# Patient Record
Sex: Female | Born: 1935 | Race: White | Hispanic: No | Marital: Single | Smoking: Former smoker
Health system: Southern US, Community
[De-identification: ages and names within clinical notes are randomized; demographics above are authoritative.]

## PROBLEM LIST (undated history)

## (undated) DIAGNOSIS — M810 Age-related osteoporosis without current pathological fracture: Secondary | ICD-10-CM

## (undated) DIAGNOSIS — E785 Hyperlipidemia, unspecified: Secondary | ICD-10-CM

## (undated) DIAGNOSIS — H3530 Unspecified macular degeneration: Secondary | ICD-10-CM

## (undated) DIAGNOSIS — I872 Venous insufficiency (chronic) (peripheral): Secondary | ICD-10-CM

## (undated) DIAGNOSIS — K5520 Angiodysplasia of colon without hemorrhage: Secondary | ICD-10-CM

## (undated) DIAGNOSIS — I4891 Unspecified atrial fibrillation: Secondary | ICD-10-CM

## (undated) DIAGNOSIS — F411 Generalized anxiety disorder: Secondary | ICD-10-CM

## (undated) DIAGNOSIS — K5730 Diverticulosis of large intestine without perforation or abscess without bleeding: Secondary | ICD-10-CM

## (undated) DIAGNOSIS — M1990 Unspecified osteoarthritis, unspecified site: Secondary | ICD-10-CM

## (undated) DIAGNOSIS — K55059 Acute (reversible) ischemia of intestine, part and extent unspecified: Secondary | ICD-10-CM

## (undated) DIAGNOSIS — K5731 Diverticulosis of large intestine without perforation or abscess with bleeding: Secondary | ICD-10-CM

## (undated) DIAGNOSIS — K219 Gastro-esophageal reflux disease without esophagitis: Secondary | ICD-10-CM

## (undated) DIAGNOSIS — D62 Acute posthemorrhagic anemia: Secondary | ICD-10-CM

## (undated) HISTORY — DX: Gastro-esophageal reflux disease without esophagitis: K21.9

## (undated) HISTORY — DX: Diverticulosis of large intestine without perforation or abscess without bleeding: K57.30

## (undated) HISTORY — DX: Unspecified macular degeneration: H35.30

## (undated) HISTORY — DX: Venous insufficiency (chronic) (peripheral): I87.2

## (undated) HISTORY — DX: Hyperlipidemia, unspecified: E78.5

## (undated) HISTORY — DX: Acute posthemorrhagic anemia: D62

## (undated) HISTORY — DX: Unspecified osteoarthritis, unspecified site: M19.90

## (undated) HISTORY — DX: Generalized anxiety disorder: F41.1

## (undated) HISTORY — DX: Angiodysplasia of colon without hemorrhage: K55.20

## (undated) HISTORY — DX: Unspecified atrial fibrillation: I48.91

## (undated) HISTORY — DX: Age-related osteoporosis without current pathological fracture: M81.0

## (undated) HISTORY — DX: Diverticulosis of large intestine without perforation or abscess with bleeding: K57.31

---
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
## 1998-04-10 ENCOUNTER — Ambulatory Visit (HOSPITAL_COMMUNITY): Admission: RE | Admit: 1998-04-10 | Discharge: 1998-04-10 | Payer: Self-pay | Admitting: Obstetrics & Gynecology

## 1999-04-17 ENCOUNTER — Other Ambulatory Visit: Admission: RE | Admit: 1999-04-17 | Discharge: 1999-04-17 | Payer: Self-pay | Admitting: Obstetrics & Gynecology

## 2001-11-07 ENCOUNTER — Other Ambulatory Visit: Admission: RE | Admit: 2001-11-07 | Discharge: 2001-11-07 | Payer: Self-pay | Admitting: Obstetrics & Gynecology

## 2003-12-04 ENCOUNTER — Other Ambulatory Visit: Admission: RE | Admit: 2003-12-04 | Discharge: 2003-12-04 | Payer: Self-pay | Admitting: Obstetrics & Gynecology

## 2004-06-01 ENCOUNTER — Encounter: Payer: Self-pay | Admitting: Gastroenterology

## 2004-06-01 DIAGNOSIS — K5730 Diverticulosis of large intestine without perforation or abscess without bleeding: Secondary | ICD-10-CM | POA: Insufficient documentation

## 2004-06-01 DIAGNOSIS — K5520 Angiodysplasia of colon without hemorrhage: Secondary | ICD-10-CM

## 2004-07-24 ENCOUNTER — Ambulatory Visit: Payer: Self-pay | Admitting: Pulmonary Disease

## 2005-01-26 ENCOUNTER — Ambulatory Visit: Payer: Self-pay | Admitting: Pulmonary Disease

## 2005-03-09 ENCOUNTER — Ambulatory Visit: Payer: Self-pay | Admitting: Pulmonary Disease

## 2005-07-27 ENCOUNTER — Ambulatory Visit: Payer: Self-pay | Admitting: Pulmonary Disease

## 2005-08-05 ENCOUNTER — Ambulatory Visit: Payer: Self-pay | Admitting: Pulmonary Disease

## 2005-11-11 ENCOUNTER — Ambulatory Visit: Payer: Self-pay | Admitting: Pulmonary Disease

## 2006-01-25 ENCOUNTER — Ambulatory Visit: Payer: Self-pay | Admitting: Pulmonary Disease

## 2006-04-27 ENCOUNTER — Ambulatory Visit (HOSPITAL_COMMUNITY): Admission: RE | Admit: 2006-04-27 | Discharge: 2006-04-27 | Payer: Self-pay | Admitting: Pulmonary Disease

## 2006-04-27 ENCOUNTER — Ambulatory Visit: Payer: Self-pay | Admitting: Pulmonary Disease

## 2006-06-08 ENCOUNTER — Ambulatory Visit: Payer: Self-pay | Admitting: Pulmonary Disease

## 2006-07-26 ENCOUNTER — Ambulatory Visit: Payer: Self-pay | Admitting: Pulmonary Disease

## 2007-01-24 ENCOUNTER — Ambulatory Visit: Payer: Self-pay | Admitting: Pulmonary Disease

## 2007-01-24 LAB — CONVERTED CEMR LAB
ALT: 21 units/L (ref 0–40)
Alkaline Phosphatase: 50 units/L (ref 39–117)
BUN: 14 mg/dL (ref 6–23)
Basophils Relative: 0.5 % (ref 0.0–1.0)
CO2: 32 meq/L (ref 19–32)
Calcium: 8.9 mg/dL (ref 8.4–10.5)
Creatinine, Ser: 0.8 mg/dL (ref 0.4–1.2)
HDL: 55.6 mg/dL (ref >39.0)
Hemoglobin: 14.5 g/dL (ref 12.0–15.0)
LDL Cholesterol: 61 mg/dL (ref 0–99)
Monocytes Absolute: 0.5 10*3/uL (ref 0.2–0.7)
Monocytes Relative: 8.4 % (ref 3.0–11.0)
Platelets: 229 10*3/uL (ref 150–400)
RDW: 13.3 % (ref 11.5–14.6)
Total Bilirubin: 0.9 mg/dL (ref 0.3–1.2)
Total Protein: 7 g/dL (ref 6.0–8.3)
VLDL: 18 mg/dL (ref 0–40)
WBC: 6.4 10*3/uL (ref 4.5–10.5)

## 2007-02-14 ENCOUNTER — Encounter: Payer: Self-pay | Admitting: Pulmonary Disease

## 2007-05-12 ENCOUNTER — Ambulatory Visit: Payer: Self-pay | Admitting: Gastroenterology

## 2007-05-12 ENCOUNTER — Ambulatory Visit: Payer: Self-pay | Admitting: Pulmonary Disease

## 2007-05-12 LAB — CONVERTED CEMR LAB
CO2: 31 meq/L (ref 19–32)
GFR calc Af Amer: 89 mL/min
Glucose, Bld: 101 mg/dL — ABNORMAL HIGH (ref 70–99)
Potassium: 4.1 meq/L (ref 3.5–5.1)
Sodium: 142 meq/L (ref 135–145)

## 2007-05-31 ENCOUNTER — Ambulatory Visit: Payer: Self-pay | Admitting: Internal Medicine

## 2007-06-05 ENCOUNTER — Ambulatory Visit: Payer: Self-pay

## 2007-06-05 ENCOUNTER — Ambulatory Visit: Payer: Self-pay | Admitting: Internal Medicine

## 2007-06-05 ENCOUNTER — Encounter: Payer: Self-pay | Admitting: Internal Medicine

## 2007-06-06 ENCOUNTER — Ambulatory Visit: Payer: Self-pay | Admitting: Pulmonary Disease

## 2007-06-23 ENCOUNTER — Ambulatory Visit: Payer: Self-pay | Admitting: Pulmonary Disease

## 2007-07-25 ENCOUNTER — Ambulatory Visit: Payer: Self-pay | Admitting: Internal Medicine

## 2007-09-07 ENCOUNTER — Encounter: Payer: Self-pay | Admitting: Pulmonary Disease

## 2007-09-07 ENCOUNTER — Ambulatory Visit: Payer: Self-pay

## 2007-10-02 DIAGNOSIS — E785 Hyperlipidemia, unspecified: Secondary | ICD-10-CM | POA: Insufficient documentation

## 2007-10-03 ENCOUNTER — Ambulatory Visit: Payer: Self-pay | Admitting: Pulmonary Disease

## 2007-10-03 DIAGNOSIS — M810 Age-related osteoporosis without current pathological fracture: Secondary | ICD-10-CM

## 2007-10-03 DIAGNOSIS — I4821 Permanent atrial fibrillation: Secondary | ICD-10-CM | POA: Insufficient documentation

## 2007-10-03 DIAGNOSIS — M1990 Unspecified osteoarthritis, unspecified site: Secondary | ICD-10-CM | POA: Insufficient documentation

## 2007-10-03 DIAGNOSIS — F411 Generalized anxiety disorder: Secondary | ICD-10-CM

## 2007-10-03 DIAGNOSIS — I872 Venous insufficiency (chronic) (peripheral): Secondary | ICD-10-CM | POA: Insufficient documentation

## 2007-10-03 DIAGNOSIS — K219 Gastro-esophageal reflux disease without esophagitis: Secondary | ICD-10-CM

## 2007-10-03 DIAGNOSIS — I4891 Unspecified atrial fibrillation: Secondary | ICD-10-CM

## 2007-10-03 DIAGNOSIS — H353133 Nonexudative age-related macular degeneration, bilateral, advanced atrophic without subfoveal involvement: Secondary | ICD-10-CM

## 2007-10-03 LAB — CONVERTED CEMR LAB
ALT: 22 units/L (ref 0–35)
AST: 25 units/L (ref 0–37)
Alkaline Phosphatase: 58 units/L (ref 39–117)
BUN: 15 mg/dL (ref 6–23)
Basophils Relative: 0.4 % (ref 0.0–1.0)
CO2: 33 meq/L — ABNORMAL HIGH (ref 19–32)
Creatinine, Ser: 0.8 mg/dL (ref 0.4–1.2)
HCT: 43.8 % (ref 36.0–46.0)
Hemoglobin: 14.7 g/dL (ref 12.0–15.0)
LDL Cholesterol: 62 mg/dL (ref 0–99)
Monocytes Absolute: 0.5 10*3/uL (ref 0.2–0.7)
Monocytes Relative: 8.3 % (ref 3.0–11.0)
Potassium: 4.9 meq/L (ref 3.5–5.1)
RDW: 12.4 % (ref 11.5–14.6)
Total Bilirubin: 0.8 mg/dL (ref 0.3–1.2)
Total Protein: 6.8 g/dL (ref 6.0–8.3)
VLDL: 20 mg/dL (ref 0–40)

## 2007-10-26 ENCOUNTER — Ambulatory Visit: Payer: Self-pay | Admitting: Internal Medicine

## 2008-04-02 ENCOUNTER — Ambulatory Visit: Payer: Self-pay | Admitting: Pulmonary Disease

## 2008-04-08 ENCOUNTER — Encounter: Payer: Self-pay | Admitting: Pulmonary Disease

## 2008-05-02 ENCOUNTER — Ambulatory Visit: Payer: Self-pay | Admitting: Internal Medicine

## 2008-05-15 ENCOUNTER — Ambulatory Visit: Payer: Self-pay | Admitting: Pulmonary Disease

## 2008-09-10 ENCOUNTER — Ambulatory Visit: Payer: Self-pay | Admitting: Internal Medicine

## 2008-10-01 ENCOUNTER — Ambulatory Visit: Payer: Self-pay | Admitting: Pulmonary Disease

## 2008-10-06 LAB — CONVERTED CEMR LAB
ALT: 27 units/L (ref 0–35)
AST: 31 units/L (ref 0–37)
Basophils Relative: 0.2 % (ref 0.0–3.0)
CO2: 29 meq/L (ref 19–32)
Calcium: 9 mg/dL (ref 8.4–10.5)
Chloride: 104 meq/L (ref 96–112)
Cholesterol: 133 mg/dL (ref 0–200)
Creatinine, Ser: 0.7 mg/dL (ref 0.4–1.2)
Eosinophils Relative: 2.2 % (ref 0.0–5.0)
Glucose, Bld: 99 mg/dL (ref 70–99)
Hemoglobin: 15 g/dL (ref 12.0–15.0)
LDL Cholesterol: 60 mg/dL (ref 0–99)
Lymphocytes Relative: 28.1 % (ref 12.0–46.0)
Monocytes Relative: 8.9 % (ref 3.0–12.0)
Neutro Abs: 3.5 10*3/uL (ref 1.4–7.7)
RBC: 4.84 M/uL (ref 3.87–5.11)
TSH: 1.83 microintl units/mL (ref 0.35–5.50)
Total CHOL/HDL Ratio: 2.4
Total Protein: 7.3 g/dL (ref 6.0–8.3)
Vit D, 1,25-Dihydroxy: 29 — ABNORMAL LOW (ref 30–89)
WBC: 5.7 10*3/uL (ref 4.5–10.5)

## 2008-10-22 ENCOUNTER — Telehealth (INDEPENDENT_AMBULATORY_CARE_PROVIDER_SITE_OTHER): Payer: Self-pay | Admitting: *Deleted

## 2008-12-03 ENCOUNTER — Telehealth: Payer: Self-pay | Admitting: Internal Medicine

## 2009-04-01 ENCOUNTER — Ambulatory Visit: Payer: Self-pay | Admitting: Pulmonary Disease

## 2009-04-01 LAB — CONVERTED CEMR LAB
Calcium: 9.2 mg/dL (ref 8.4–10.5)
GFR calc non Af Amer: 73.53 mL/min (ref >60)
Glucose, Bld: 97 mg/dL (ref 70–99)
Potassium: 4.6 meq/L (ref 3.5–5.1)
Sodium: 144 meq/L (ref 135–145)
Total CHOL/HDL Ratio: 3
VLDL: 19.8 mg/dL (ref 0.0–40.0)

## 2009-05-01 ENCOUNTER — Encounter: Payer: Self-pay | Admitting: Gastroenterology

## 2009-05-21 ENCOUNTER — Ambulatory Visit: Payer: Self-pay | Admitting: Pulmonary Disease

## 2009-08-26 ENCOUNTER — Ambulatory Visit: Payer: Self-pay | Admitting: Internal Medicine

## 2009-10-01 ENCOUNTER — Ambulatory Visit: Payer: Self-pay | Admitting: Pulmonary Disease

## 2009-10-31 ENCOUNTER — Telehealth (INDEPENDENT_AMBULATORY_CARE_PROVIDER_SITE_OTHER): Payer: Self-pay | Admitting: *Deleted

## 2010-03-31 ENCOUNTER — Ambulatory Visit: Payer: Self-pay | Admitting: Pulmonary Disease

## 2010-04-06 LAB — CONVERTED CEMR LAB
ALT: 20 units/L (ref 0–35)
AST: 25 units/L (ref 0–37)
Alkaline Phosphatase: 66 units/L (ref 39–117)
BUN: 19 mg/dL (ref 6–23)
Basophils Absolute: 0 10*3/uL (ref 0.0–0.1)
Bilirubin, Direct: 0.1 mg/dL (ref 0.0–0.3)
Chloride: 104 meq/L (ref 96–112)
Cholesterol: 147 mg/dL (ref 0–200)
Eosinophils Absolute: 0.1 10*3/uL (ref 0.0–0.7)
Eosinophils Relative: 1.5 % (ref 0.0–5.0)
GFR calc non Af Amer: 85.56 mL/min (ref >60)
HCT: 42 % (ref 36.0–46.0)
LDL Cholesterol: 72 mg/dL (ref 0–99)
Lymphs Abs: 1.6 10*3/uL (ref 0.7–4.0)
MCHC: 33.8 g/dL (ref 30.0–36.0)
MCV: 93.6 fL (ref 78.0–100.0)
Monocytes Absolute: 0.5 10*3/uL (ref 0.1–1.0)
Neutrophils Relative %: 60.4 % (ref 43.0–77.0)
Platelets: 200 10*3/uL (ref 150.0–400.0)
Potassium: 4.9 meq/L (ref 3.5–5.1)
RDW: 13.9 % (ref 11.5–14.6)
Sodium: 143 meq/L (ref 135–145)
Total Bilirubin: 0.8 mg/dL (ref 0.3–1.2)
Triglycerides: 103 mg/dL (ref 0.0–149.0)
WBC: 5.5 10*3/uL (ref 4.5–10.5)

## 2010-05-27 ENCOUNTER — Ambulatory Visit: Payer: Self-pay | Admitting: Pulmonary Disease

## 2010-05-28 ENCOUNTER — Encounter: Payer: Self-pay | Admitting: Pulmonary Disease

## 2010-05-28 LAB — HM DEXA SCAN

## 2010-08-25 ENCOUNTER — Ambulatory Visit
Admission: RE | Admit: 2010-08-25 | Discharge: 2010-08-25 | Payer: Self-pay | Source: Home / Self Care | Attending: Pulmonary Disease | Admitting: Pulmonary Disease

## 2010-08-27 ENCOUNTER — Ambulatory Visit: Admit: 2010-08-27 | Payer: Self-pay | Admitting: Internal Medicine

## 2010-09-17 ENCOUNTER — Encounter: Payer: Self-pay | Admitting: Internal Medicine

## 2010-09-17 ENCOUNTER — Ambulatory Visit
Admission: RE | Admit: 2010-09-17 | Discharge: 2010-09-17 | Payer: Self-pay | Source: Home / Self Care | Attending: Internal Medicine | Admitting: Internal Medicine

## 2010-09-24 NOTE — Procedures (Signed)
Summary: Recall / [facility]  Recall / [facility]   Imported By: Lennie Odor 01/22/2010 16:56:33  _____________________________________________________________________  External Attachment:    Type:   Image     Comment:   External Document

## 2010-09-24 NOTE — Assessment & Plan Note (Signed)
Summary: 6 months/apc   Primary Care Provider:  [name]  CC:  6 month ROV & review of mult medical problems....  History of Present Illness: 77 y/o WF here for a follow up visit... he has multiple medical problems as noted below...    ~  October 02, 2009:  her stress level is reduced since [name] passes & her caregiver role has diminished... generally stable w/ no new complaints or concerns... she saw [name] 1/11- chr AFib doing well, no changes made, f/u 91yr... she stopped her Bisphos Rx (Actonel) in 2010 & refused to restart "I do Yogurt"- offered the new ATELVIA (easier to take, coated for less GI side effects) & she will consider this.   ~  March 31, 2010:  she's had a good 37mo- no new complaints or concerns... she tripped & fell down 3 steps at home- no injury but great concern because of her [name]- last BMD [name] 2008 & she needs f/u BMD but declines "I'll think about it" and still refuses meds- pills, shots, IV, etc... otherw stable- denies CP, palpit, SOB, etc;  BP controlled;  Lipids have been good on the simva20;  Aplraz refilled today & fasting labs done...   Current Problem List:  MACULAR DEGENERATION (ICD-362.50) - she's followed in the [facility] [place] clinic Q74mo... on Eye Vits and stable...  ATRIAL FIBRILLATION, CHRONIC (ICD-427.31) - on ASA 81mg /d... 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 HR=70 today and she feels well, BP= 118/64... denies CP, palpit, dizzy, SOB, etc...  ~  Cards f/u [name] 1/11- doing well, no changes, f/u planned 90yr.  VENOUS INSUFFICIENCY (ICD-459.81) - she follows a low sodium diet... not on diuretic therapy.  HYPERLIPIDEMIA (ICD-272.4) - on SIMVASTATIN 20mg /d now (changed from Vytorin to save $$).  ~  FLP was 6/08 showing TChol 135, TG 90, HDL 56, LDL 61...  ~  FLP 2/09 showed TChol 140, TG 99, HDL 59, LDL 62... great!  ~  FLP 2/10 on Vytor10/20 showed TChol 133, TG 83, HDL 56,  LDL 60... rec> change to Simva20.  ~  FLP 8/10 on Simva20 showed TChol 143, TG 99, HDL 51, LDL 73  ~  FLP 8/11 on Simva20 showed TChol 147, TG 103, HDL 54, LDL 72  GERD (ICD-530.81) - she takes PRILOSEC 20mg /d as needed...  DIVERTICULOSIS OF COLON (ICD-562.10) - no abd pain or episodes of diverticulitis... ANGIODYSPLASIA, COLON (EAV-409.81) - last colonoscopy was 10/05 by [name] showing divertics and colon AVMs... f/u planned in 57yrs.  DEGENERATIVE JOINT DISEASE (ICD-715.90) - only c/o some hand pain... uses OTC meds as needed... her main exercise is "up and down the steps 40-11 times a day"...  OSTEOPOROSIS (ICD-733.00) - prev took Actonel but stopped on her own 2010...  takes Ca++, MVI, VitD OTC... her last BMD was 3/08 by [name] for GYN and he follows this (she gets BMD and Mammograms at his office)... she states INTOL to Alendronate "it made me sick" & refuses to restart meds for her bones (either pills, shots, or IV Rx)...  ~  labs 2/10 showed Vit D level = 29... rec> add Vit D 1000 u OTC daily.  ~  labs 8/10 showed Vit D level = 31... rec> continue 1000 u daily.  ANXIETY (ICD-300.00) - she has ALPRAZOLAM 0.5mg  - 1/2 to 1 tab Tid Prn...   Preventive Screening-Counseling & Management  Alcohol-Tobacco     Smoking Status: never     Passive  Smoke Exposure: yes  Allergies: 1)  ! Penicillin 2)  ! Lipitor (Atorvastatin Calcium) 3)  ! Fosamax (Alendronate Sodium)  Comments:  Nurse/Medical Assistant: The patient's medications and allergies were reviewed with the patient and were updated in the Medication and Allergy Lists.  Past History:  Past Medical History: MACULAR DEGENERATION (ICD-362.50) ATRIAL FIBRILLATION, CHRONIC (ICD-427.31) VENOUS INSUFFICIENCY (ICD-459.81) HYPERLIPIDEMIA (ICD-272.4) GERD (ICD-530.81) DIVERTICULOSIS OF COLON (ICD-562.10) ANGIODYSPLASIA, COLON (ICD-569.84) DEGENERATIVE JOINT DISEASE (ICD-715.90) OSTEOPOROSIS (ICD-733.00) ANXIETY  (ICD-300.00)  Past Surgical History: Appendectomy (1610) Tonsillectomy (1943)  Family History: Reviewed history from 04/02/2008 and no changes required. Sister - heart disease - (atrial fib) Sister - breast cancer Sister - lymphoma Mother - died at age 33 Father - deceased at age 25, hx hemochromatosis  Social History: Reviewed history from 04/01/2009 and no changes required. Single No children Retired  Tobacco Use - No.  Alcohol Use - no Regular Exercise - no  Review of Systems      See HPI  The patient denies anorexia, fever, weight loss, weight gain, vision loss, decreased hearing, hoarseness, chest pain, syncope, dyspnea on exertion, peripheral edema, prolonged cough, headaches, hemoptysis, abdominal pain, melena, hematochezia, severe indigestion/heartburn, hematuria, incontinence, muscle weakness, suspicious skin lesions, transient blindness, difficulty walking, depression, unusual weight change, abnormal bleeding, enlarged lymph nodes, and angioedema.    Vital Signs:  Patient profile:   77 year old female Height:      66 inches Weight:      159 pounds BMI:     25.76 O2 Sat:      94 % on Room air Temp:     96.3 degrees F oral Pulse rate:   70 / minute BP sitting:   118 / 64  (right arm) Cuff size:   regular  Vitals Entered By: Randell Loop CMA (March 31, 2010 10:22 AM)  O2 Sat at Rest %:  94 O2 Flow:  Room air CC: 6 month ROV & review of mult medical problems... Is Patient Diabetic? No Pain Assessment Patient in pain? no      Comments meds updated today---pt brought in most meds today   Physical Exam  Additional Exam:  WD, WN, 77 y/o WF in NAD... GENERAL:  Alert & oriented; pleasant & cooperative... HEENT:  [place]/AT, EOM-wnl, PERRLA, EACs-clear, TMs-wnl, NOSE-clear, THROAT-clear & wnl. NECK:  Supple w/ fairROM; no JVD; normal carotid impulses w/o bruits; no thyromegaly or nodules palpated; no lymphadenopathy. CHEST:  Clear to P & A; without wheezes/  rales/ or rhonchi... HEART:  irreg w/ controlled response, without murmurs/ rubs/ or gallops heard... ABDOMEN:  Soft & nontender; normal bowel sounds; no organomegaly or masses detected. EXT: without deformities, mild arthritic changes; no varicose veins/ +venous insuffic/ no edema. NEURO:  CN's intact; no focal neuro deficits... DERM:  No lesions noted; no rash etc...    MISC. Report  Procedure date:  03/31/2010  Findings:      BMP (METABOL)   Sodium                    143 mEq/L                   135-145   Potassium                 4.9 mEq/L                   3.5-5.1   Chloride  104 mEq/L                   96-112   Carbon Dioxide            32 mEq/L                    19-32   Glucose                   94 mg/dL                    04-54   BUN                       19 mg/dL                    0-98   Creatinine                0.7 mg/dL                   1.1-9.1   Calcium                   9.1 mg/dL                   4.7-82.9   GFR                       85.56 mL/min                >60  Hepatic/Liver Function Panel (HEPATIC)   Total Bilirubin           0.8 mg/dL                   5.6-2.1   Direct Bilirubin          0.1 mg/dL                   3.0-8.6   Alkaline Phosphatase      66 U/L                      39-117   AST                       25 U/L                      0-37   ALT                       20 U/L                      0-35   Total Protein             6.7 g/dL                    5.7-8.4   Albumin                   4.0 g/dL                    6.9-6.2  CBC Platelet w/Diff (CBCD)   White Cell Count          5.5 K/uL                    4.5-10.5  Red Cell Count            4.49 Mil/uL                 3.87-5.11   Hemoglobin                14.2 g/dL                   04.5-40.9   Hematocrit                42.0 %                      36.0-46.0   MCV                       93.6 fl                     78.0-100.0   Platelet Count            200.0 K/uL                   150.0-400.0   Neutrophil %              60.4 %                      43.0-77.0   Lymphocyte %              28.8 %                      12.0-46.0   Monocyte %                8.8 %                       3.0-12.0   Eosinophils%              1.5 %                       0.0-5.0   Basophils %               0.5 %                       0.0-3.0  Comments:      Lipid Panel (LIPID)   Cholesterol               147 mg/dL                   8-119   Triglycerides             103.0 mg/dL                 1.4-782.9   HDL                       56.21 mg/dL                 >30.86   LDL Cholesterol           72 mg/dL                    5-78   TSH (TSH)   FastTSH                   1.76 uIU/mL  0.35-5.50   Impression & Recommendations:  Problem # 1:  ATRIAL FIBRILLATION, CHRONIC (ICD-427.31) Stable w/o CP, palpit, SOB, etc... rate control strategy & no coumadin... Her updated medication list for this problem includes:    Adult Aspirin Ec Low Strength 81 Mg Tbec (Aspirin) .Marland Kitchen... Take 2 tablets by mouth once daily    Metoprolol Succinate 25 Mg Xr24h-tab (Metoprolol succinate) .Marland Kitchen... Take 1 tablet by mouth once a day    Cardizem Cd 180 Mg Cp24 (Diltiazem hcl coated beads) .Marland Kitchen... 1 tab by mouth once daily  Orders: TLB-BMP (Basic Metabolic Panel-BMET) (80048-METABOL) TLB-Hepatic/Liver Function Pnl (80076-HEPATIC) TLB-CBC Platelet - w/Differential (85025-CBCD) TLB-Lipid Panel (80061-LIPID) TLB-TSH (Thyroid Stimulating Hormone) (84443-TSH)  Problem # 2:  HYPERLIPIDEMIA (ICD-272.4) Stable on diet + Simva20>  continue same. Her updated medication list for this problem includes:    Simvastatin 20 Mg Tabs (Simvastatin) .Marland Kitchen... Take 1 tab by mouth at bedtime...  Problem # 3:  GI>>> She has GERD, Divertics, AVMs>  she remains asymptomatic from the GI standpoint & doesn't want routine GI f/u or repeat colonoscopy...  uses Prilosec OTC Prn...  Problem # 4:  DEGENERATIVE JOINT DISEASE (ICD-715.90) She  manages satis w/ OTC anti-inflamm meds as needed & Tylenol... Her updated medication list for this problem includes:    Adult Aspirin Ec Low Strength 81 Mg Tbec (Aspirin) .Marland Kitchen... Take 2 tablets by mouth once daily  Problem # 5:  OSTEOPOROSIS (ICD-733.00) We had a long discussion regarding her Osteoporosis & need for therapy> her last BMD was 2008 by [name] & she declines f/u BMD to check her status at this time... likewise refuses Bisphos Rx- oral or IV, and doesn't want Forteo shots etc... she knows to be careful, she has a friend w/ severe bone problems, states she'll "think about it"...   Problem # 6:  ANXIETY (ICD-300.00) Alpraz refilled per pt request... Her updated medication list for this problem includes:    Alprazolam 0.5 Mg Tabs (Alprazolam) .Marland Kitchen... Take 1/2 to 1 tab by mouth three times a day as needed for nerves...  Problem # 7:  OTHER MEDICAL PROBLEMS AS NOTED>>> OK TDAP today...  Complete Medication List: 1)  Adult Aspirin Ec Low Strength 81 Mg Tbec (Aspirin) .... Take 2 tablets by mouth once daily 2)  Metoprolol Succinate 25 Mg Xr24h-tab (Metoprolol succinate) .... Take 1 tablet by mouth once a day 3)  Cardizem Cd 180 Mg Cp24 (Diltiazem hcl coated beads) .Marland Kitchen.. 1 tab by mouth once daily 4)  Simvastatin 20 Mg Tabs (Simvastatin) .... Take 1 tab by mouth at bedtime.Marland KitchenMarland Kitchen 5)  Prilosec Otc 20 Mg Tbec (Omeprazole magnesium) .... Take 1 tablet by mouth once a day 6)  Caltrate 600+d 600-400 Mg-unit Tabs (Calcium carbonate-vitamin d) .Marland Kitchen.. 1 tab twice daily.Marland KitchenMarland Kitchen 7)  Eye Vitamins Tabs (Multiple vitamins-minerals) .... Take one tablet daiy. 8)  Centrum Silver Tabs (Multiple vitamins-minerals) .... Take 1 tablet by mouth once a day as needed 9)  Vitamin D 1000 Unit Tabs (Cholecalciferol) .... Once daily 10)  Alprazolam 0.5 Mg Tabs (Alprazolam) .... Take 1/2 to 1 tab by mouth three times a day as needed for nerves...  Other Orders: Tdap => 7yrs IM (51761) Admin 1st Vaccine (60737)  Patient  Instructions: 1)  Today we updated your med list- see below.... 2)  We refilled your Alprazolam perscription today... 3)  Today we did your follow up FASTING blood work... please call the "phone tree" in a few days for your lab results.Marland KitchenMarland Kitchen  4)  We also gave  you the combined Tetanus shot called the TDAP today (it should be good for 34yrs)... 5)  Call for any problems.Marland KitchenMarland Kitchen 6)  Please schedule a follow-up appointment in 6 months. Prescriptions: ALPRAZOLAM 0.5 MG  TABS (ALPRAZOLAM) take 1/2 to 1 tab by mouth three times a day as needed for nerves...  #90 x 6   Entered and Authorized by:   Michele Mcalpine MD   Signed by:   Michele Mcalpine MD on 03/31/2010   Method used:   Print then Give to Patient   RxID:   [id number]    Immunizations Administered:  Tetanus Vaccine:    Vaccine Type: Tdap    Site: left deltoid    Mfr: boostrix    Dose: 0.5 ml    Route: IM    Given by: Randell Loop CMA    Exp. Date: 11/14/2011    Lot #: [id number]    VIS given: 07/11/07 version given March 31, 2010.

## 2010-09-24 NOTE — Assessment & Plan Note (Signed)
Summary: Acute NP office visit - sore throat   Primary Provider/Referring Provider:  [name]  CC:  sore throat, hoarseness, some upper airway wheezing, occ prod cough with clear mucus, and PND x3days - denies f/c/s.  History of Present Illness: 77 y/o WF     ~  October 02, 2009:  her stress level is reduced since [name] passes & her caregiver role has diminished... generally stable w/ no new complaints or concerns... she saw [name] 1/11- chr AFib doing well, no changes made, f/u 69yr... she stopped her Bisphos Rx (Actonel) in 2010 & refused to restart "I do Yogurt"- offered the new ATELVIA (easier to take, coated for less GI side effects) & she will consider this.   ~  March 31, 2010:  she's had a good 32mo- no new complaints or concerns... she tripped & fell down 3 steps at home- no injury but great concern because of her [name]- last BMD [name] 2008 & she needs f/u BMD but declines "I'll think about it" and still refuses meds- pills, shots, IV, etc... otherw stable- denies CP, palpit, SOB, etc;  BP controlled;  Lipids have been good on the simva20;  Aplraz refilled today & fasting labs done...  August 25, 2010 --Presents for an acute office visit. Complains of sore throat, hoarseness, some upper airway wheezing, occ prod cough with clear mucus, PND x3days. Hoarseness is better today. Cough is mild and dry. W/ no discolored mucus. Denies chest pain,  orthopnea, hemoptysis, fever, n/v/d, edema, headache,recent travel .   Medications Prior to Update: 1)  Adult Aspirin Ec Low Strength 81 Mg  Tbec (Aspirin) .... Take 2 Tablets By Mouth Once Daily 2)  Metoprolol Succinate 25 Mg Xr24h-Tab (Metoprolol Succinate) .... Take 1 Tablet By Mouth Once A Day 3)  Cardizem Cd 180 Mg  Cp24 (Diltiazem Hcl Coated Beads) .Marland Kitchen.. 1 Tab By Mouth Once Daily 4)  Simvastatin 20 Mg Tabs (Simvastatin) .... Take 1 Tab By Mouth At Bedtime.Marland KitchenMarland Kitchen 5)  Prilosec Otc 20 Mg  Tbec (Omeprazole Magnesium) .... Take 1 Tablet By Mouth Once  A Day 6)  Caltrate 600+d 600-400 Mg-Unit  Tabs (Calcium Carbonate-Vitamin D) .Marland Kitchen.. 1 Tab Twice Daily.Marland KitchenMarland Kitchen 7)  Eye Vitamins   Tabs (Multiple Vitamins-Minerals) .... Take One Tablet Daiy. 8)  Centrum Silver   Tabs (Multiple Vitamins-Minerals) .... Take 1 Tablet By Mouth Once A Day As Needed 9)  Vitamin D 1000 Unit  Tabs (Cholecalciferol) .... Once Daily 10)  Alprazolam 0.5 Mg  Tabs (Alprazolam) .... Take 1/2 To 1 Tab By Mouth Three Times A Day As Needed For Nerves...  Current Medications (verified): 1)  Adult Aspirin Ec Low Strength 81 Mg  Tbec (Aspirin) .... Take 2 Tablets By Mouth Once Daily 2)  Metoprolol Succinate 25 Mg Xr24h-Tab (Metoprolol Succinate) .... Take 1 Tablet By Mouth Once A Day 3)  Cardizem Cd 180 Mg  Cp24 (Diltiazem Hcl Coated Beads) .Marland Kitchen.. 1 Tab By Mouth Once Daily 4)  Simvastatin 20 Mg Tabs (Simvastatin) .... Take 1 Tab By Mouth At Bedtime.Marland KitchenMarland Kitchen 5)  Prilosec Otc 20 Mg  Tbec (Omeprazole Magnesium) .... Take 1 Tablet By Mouth Once A Day 6)  Caltrate 600+d 600-400 Mg-Unit  Tabs (Calcium Carbonate-Vitamin D) .Marland Kitchen.. 1 Tab Twice Daily.Marland KitchenMarland Kitchen 7)  Eye Vitamins   Tabs (Multiple Vitamins-Minerals) .... Take One Tablet Daiy. 8)  Centrum Silver   Tabs (Multiple Vitamins-Minerals) .... Take 1 Tablet By Mouth Once A Day As Needed 9)  Vitamin D 1000 Unit  Tabs (Cholecalciferol) .Marland KitchenMarland KitchenMarland Kitchen  Once Daily 10)  Alprazolam 0.5 Mg  Tabs (Alprazolam) .... Take 1/2 To 1 Tab By Mouth Three Times A Day As Needed For Nerves...  Allergies (verified): 1)  ! Penicillin 2)  ! Lipitor (Atorvastatin Calcium) 3)  ! Fosamax (Alendronate Sodium)  Past History:  Past Medical History: Last updated: 03/31/2010 MACULAR DEGENERATION (ICD-362.50) ATRIAL FIBRILLATION, CHRONIC (ICD-427.31) VENOUS INSUFFICIENCY (ICD-459.81) HYPERLIPIDEMIA (ICD-272.4) GERD (ICD-530.81) DIVERTICULOSIS OF COLON (ICD-562.10) ANGIODYSPLASIA, COLON (ZOX-096.04) DEGENERATIVE JOINT DISEASE (ICD-715.90) OSTEOPOROSIS (ICD-733.00) ANXIETY  (ICD-300.00)  Past Surgical History: Last updated: 03/31/2010 Appendectomy (1951) Tonsillectomy (1943)  Family History: Last updated: 03/31/2010 Sister - heart disease - (atrial fib) Sister - breast cancer Sister - lymphoma Mother - died at age 2 Father - deceased at age 96, hx hemochromatosis  Social History: Last updated: 03/31/2010 Single No children Retired  Tobacco Use - No.  Alcohol Use - no Regular Exercise - no  Risk Factors: Smoking Status: never (03/31/2010) Passive Smoke Exposure: yes (03/31/2010)  Review of Systems      See HPI  Vital Signs:  Patient profile:   77 year old female Height:      66 inches Weight:      157.38 pounds BMI:     25.49 O2 Sat:      93 % on Room air Temp:     97.0 degrees F oral Pulse rate:   73 / minute BP sitting:   110 / 74  (left arm) Cuff size:   regular  Vitals Entered By: Boone Master CNA/MA (August 25, 2010 3:47 PM)  O2 Flow:  Room air CC: sore throat, hoarseness, some upper airway wheezing, occ prod cough with clear mucus, PND x3days - denies f/c/s Is Patient Diabetic? No Comments Medications reviewed with patient Daytime contact number verified with patient. Boone Master CNA/MA  August 25, 2010 3:47 PM    Physical Exam  Additional Exam:  WD, WN, 77 y/o WF in NAD... GENERAL:  Alert & oriented; pleasant & cooperative... HEENT:  [place]/AT, EOM-wnl, PERRLA, EACs-clear, TMs-wnl, NOSE-clear, THROAT-clear & wnl. NECK:  Supple w/ fairROM; no JVD; normal carotid impulses w/o bruits; no thyromegaly or nodules palpated; no lymphadenopathy. CHEST:  Clear to P & A; without wheezes/ rales/ or rhonchi... HEART:  irreg w/ controlled response, without murmurs/ rubs/ or gallops heard... ABDOMEN:  Soft & nontender; normal bowel sounds; no organomegaly or masses detected. EXT: without deformities, mild arthritic changes; no varicose veins/ +venous insuffic/ no edema.     Impression & Recommendations:  Problem # 1:  UPPER  RESPIRATORY INFECTION, ACUTE (ICD-465.9)  Mild URI Plan:  Mucinex DM two times a day as needed cough/congestion Allegra 180mg  once daily as needed drainage.  Warm salt water gargles as needed  Saline nasal spray as needed  Increase fluids and rest.  Tylenol as needed  Please contact office for sooner follow up if symptoms do not improve or worsen  Her updated medication list for this problem includes:    Adult Aspirin Ec Low Strength 81 Mg Tbec (Aspirin) .Marland Kitchen... Take 2 tablets by mouth once daily  Orders: Est. Patient Level II (54098)  Complete Medication List: 1)  Adult Aspirin Ec Low Strength 81 Mg Tbec (Aspirin) .... Take 2 tablets by mouth once daily 2)  Metoprolol Succinate 25 Mg Xr24h-tab (Metoprolol succinate) .... Take 1 tablet by mouth once a day 3)  Cardizem Cd 180 Mg Cp24 (Diltiazem hcl coated beads) .Marland Kitchen.. 1 tab by mouth once daily 4)  Simvastatin 20 Mg Tabs (Simvastatin) .Marland KitchenMarland KitchenMarland Kitchen  Take 1 tab by mouth at bedtime.Marland KitchenMarland Kitchen 5)  Prilosec Otc 20 Mg Tbec (Omeprazole magnesium) .... Take 1 tablet by mouth once a day 6)  Caltrate 600+d 600-400 Mg-unit Tabs (Calcium carbonate-vitamin d) .Marland Kitchen.. 1 tab twice daily.Marland KitchenMarland Kitchen 7)  Eye Vitamins Tabs (Multiple vitamins-minerals) .... Take one tablet daiy. 8)  Centrum Silver Tabs (Multiple vitamins-minerals) .... Take 1 tablet by mouth once a day as needed 9)  Vitamin D 1000 Unit Tabs (Cholecalciferol) .... Once daily 10)  Alprazolam 0.5 Mg Tabs (Alprazolam) .... Take 1/2 to 1 tab by mouth three times a day as needed for nerves...  Patient Instructions: 1)  Mucinex DM two times a day as needed cough/congestion 2)  Allegra 180mg  once daily as needed drainage.  3)  Warm salt water gargles as needed  4)  Saline nasal spray as needed  5)  Increase fluids and rest.  6)  Tylenol as needed  7)  Please contact office for sooner follow up if symptoms do not improve or worsen

## 2010-09-24 NOTE — Progress Notes (Signed)
Summary: req to see nadel today  Phone Note Call from Patient Call back at Home Phone [phone]   Caller: Patient Call For: nadel Summary of Call: pt wants to be seen today (tp not here). pt c/o cough x 2 day w/ clear phlegm. she did have a scratchy throat but no more. no fever or chills, but she just "doesn't feel good". call asap. [facility] [address].  Initial call taken by: Tivis Ringer, CNA,  October 31, 2009 10:16 AM  Follow-up for Phone Call        Pt c/o productive cough with white phlegm x 2 days, states her side are sore from coughing so much. Pt states cough is worse at night, states she cannot sleep for coughing. Pt denies fever, chills, SOB.  Pt has been using delsym OTC with no relief. Please advise. Carron Curie CMA  October 31, 2009 10:24 AM allergies: PCN, lipitor. fosamax  Additional Follow-up for Phone Call Additional follow up Details #1::        per SN---zpak #1,  rest at home with fluids and tylenol, hydromet cough syrup  #4oz  1 tsp by mouth every 6 hours as needed for cough.  called and spoke with pt and she is aware. Randell Loop CMA  October 31, 2009 1:23 PM     New/Updated Medications: ZITHROMAX Z-PAK 250 MG TABS (AZITHROMYCIN) take as directed HYDROMET 5-1.5 MG/5ML SYRP (HYDROCODONE-HOMATROPINE) 1 tsp by mouth every 6 hours as needed for cough Prescriptions: HYDROMET 5-1.5 MG/5ML SYRP (HYDROCODONE-HOMATROPINE) 1 tsp by mouth every 6 hours as needed for cough  #4 oz x 0   Entered by:   Randell Loop CMA   Authorized by:   Michele Mcalpine MD   Signed by:   Randell Loop CMA on 10/31/2009   Method used:   Telephoned to ...       [facility]  [address]  [phone]* (retail)       [address]       [place], [place]  [address]       Ph: [phone] or [phone]       Fax: [phone]   RxID:   [id number] Christena Deem Z-PAK 250 MG TABS (AZITHROMYCIN) take as directed  #1 pak x 0   Entered by:   Randell Loop CMA   Authorized  by:   Michele Mcalpine MD   Signed by:   Randell Loop CMA on 10/31/2009   Method used:   Electronically to        [facility]  [phone]* (retail)       [address]       [place], [place]  [address]       Ph: [phone] or [phone]       Fax: [phone]   RxID:   [id number]

## 2010-09-24 NOTE — Assessment & Plan Note (Signed)
Summary: f1y  Medications Added VITAMIN D 1000 UNIT  TABS (CHOLECALCIFEROL) once daily      Allergies Added:   Primary Provider:  [name]  CC:  No complaints.  History of Present Illness: Lisa Douglas is a delightful 77 year old woman with a history of hyperlipidemia, gastric AVMs, gastroesophageal reflux disease, and chronic atrial fibrillation with a CHADS2 score of 1.  She returns today for routine followup.   Overall, Lisa Douglas is doing quite well. Not as active as previous as she is taking care fo her 73 y/o sister who is home on hospice. No CP, SOB or palpitations. Occasional ankle swelling at night. no bleeding or neuro symptoms.  Medications Prior to Update: 1)  Adult Aspirin Ec Low Strength 81 Mg  Tbec (Aspirin) .... Take 2 Tablets By Mouth Once Daily 2)  Metoprolol Succinate 25 Mg Xr24h-Tab (Metoprolol Succinate) .... Take 1 Tablet By Mouth Once A Day 3)  Cardizem Cd 180 Mg  Cp24 (Diltiazem Hcl Coated Beads) .Marland Kitchen.. 1 Tab By Mouth Once Daily 4)  Simvastatin 20 Mg Tabs (Simvastatin) .... Take 1 Tab By Mouth At Bedtime.Marland KitchenMarland Kitchen 5)  Prilosec Otc 20 Mg  Tbec (Omeprazole Magnesium) .... Take 1 Tablet By Mouth Once A Day 6)  Caltrate 600+d 600-400 Mg-Unit  Tabs (Calcium Carbonate-Vitamin D) .Marland Kitchen.. 1 Tab Twice Daily.Marland KitchenMarland Kitchen 7)  Centrum Silver   Tabs (Multiple Vitamins-Minerals) .... Take 1 Tablet By Mouth Once A Day As Needed 8)  Eye Vitamins   Tabs (Multiple Vitamins-Minerals) .... Take One Tablet Daiy. 9)  Alprazolam 0.5 Mg  Tabs (Alprazolam) .... Take 1/2 To 1 Tab By Mouth Three Times A Day As Needed For Nerves...  Current Medications (verified): 1)  Adult Aspirin Ec Low Strength 81 Mg  Tbec (Aspirin) .... Take 2 Tablets By Mouth Once Daily 2)  Metoprolol Succinate 25 Mg Xr24h-Tab (Metoprolol Succinate) .... Take 1 Tablet By Mouth Once A Day 3)  Cardizem Cd 180 Mg  Cp24 (Diltiazem Hcl Coated Beads) .Marland Kitchen.. 1 Tab By Mouth Once Daily 4)  Simvastatin 20 Mg Tabs (Simvastatin) .... Take 1 Tab By Mouth At  Bedtime.Marland KitchenMarland Kitchen 5)  Prilosec Otc 20 Mg  Tbec (Omeprazole Magnesium) .... Take 1 Tablet By Mouth Once A Day 6)  Caltrate 600+d 600-400 Mg-Unit  Tabs (Calcium Carbonate-Vitamin D) .Marland Kitchen.. 1 Tab Twice Daily.Marland KitchenMarland Kitchen 7)  Centrum Silver   Tabs (Multiple Vitamins-Minerals) .... Take 1 Tablet By Mouth Once A Day As Needed 8)  Eye Vitamins   Tabs (Multiple Vitamins-Minerals) .... Take One Tablet Daiy. 9)  Alprazolam 0.5 Mg  Tabs (Alprazolam) .... Take 1/2 To 1 Tab By Mouth Three Times A Day As Needed For Nerves... 10)  Vitamin D 1000 Unit  Tabs (Cholecalciferol) .... Once Daily  Allergies (verified): 1)  ! Penicillin 2)  ! Lipitor (Atorvastatin Calcium) 3)  ! Fosamax (Alendronate Sodium)  Past History:  Past Medical History: Last updated: 04/01/2009 MACULAR DEGENERATION (ICD-362.50) ATRIAL FIBRILLATION, CHRONIC (ICD-427.31) VENOUS INSUFFICIENCY (ICD-459.81) HYPERLIPIDEMIA (ICD-272.4) GERD (ICD-530.81) DIVERTICULOSIS OF COLON (ICD-562.10) ANGIODYSPLASIA, COLON (ICD-569.84) DEGENERATIVE JOINT DISEASE (ICD-715.90) OSTEOPOROSIS (ICD-733.00) ANXIETY (ICD-300.00)  Review of Systems       As per HPI and past medical history; otherwise all systems negative.   Vital Signs:  Patient profile:   77 year old female Height:      66 inches Weight:      153 pounds BMI:     24.78 Pulse rate:   75 / minute BP sitting:   126 / 68  (left arm) Cuff size:  regular  Vitals Entered By: Hardin Negus, RMA (August 26, 2009 11:47 AM)  Physical Exam  General:  GENERAL:  She is well-appearing and in no acute distress.  She ambulates around the clinic without any respiratory difficulty. HEENT:  Normal. NECK:  Supple.  No JVD.  Carotids are 2+ bilaterally without bruits. There is no lymphadenopathy or thyromegaly. CARDIAC:  PMI is nondisplaced.  She is irregularly irregular.  No murmurs, rubs, or gallops. LUNGS:  Clear. ABDOMEN:  Soft, nontender, and nondistended.  Good bowel sounds.   EXTREMITIES:  Warm with  no cyanosis, clubbing, tr ankle edema on R NEUROLOGIC:  Alert and oriented x3.  Cranial nerves II through XII are grossly intact.  Moves all 4 extremities without difficulty.  Affect is pleasant.    Impression & Recommendations:  Problem # 1:  ATRIAL FIBRILLATION, CHRONIC (ICD-427.31) Doing well. Asymptomatic. Good rate control. CHADS2 = 1. Continue ASA.   Patient Instructions: 1)  Follow up in 1 year

## 2010-09-24 NOTE — Assessment & Plan Note (Signed)
Summary: flu shot/ mbw   Nurse Visit   Allergies: 1)  ! Penicillin 2)  ! Lipitor (Atorvastatin Calcium) 3)  ! Fosamax (Alendronate Sodium)  Immunizations Administered:  Influenza Vaccine # 1:    Vaccine Type: Fluvax MCR    Site: left deltoid    Mfr: GlaxoSmithKline    Dose: 0.5 ml    Route: IM    Given by: Kandice Hams CMA    Exp. Date: 02/20/2011    Lot #: [id number]    VIS given: 03/17/10 version given May 27, 2010.  Flu Vaccine Consent Questions:    Do you have a history of severe allergic reactions to this vaccine? no    Any prior history of allergic reactions to egg and/or gelatin? no    Do you have a sensitivity to the preservative Thimersol? no    Do you have a past history of Guillan-Barre Syndrome? no    Do you currently have an acute febrile illness? no    Have you ever had a severe reaction to latex? no    Vaccine information given and explained to patient? yes    Are you currently pregnant? no  Orders Added: 1)  Influenza Vaccine MCR [00025]

## 2010-09-24 NOTE — Assessment & Plan Note (Signed)
Summary: 6 months/apc   Primary Care Provider:  [name]  CC:  6 month ROV & review of mult medical problems....  History of Present Illness: 77 y/o WF here for a follow up visit... he has multiple medical problems as noted below...    ~  October 02, 2009:  her stress level is reduced since [name] passes & her caregiver role as diminished... generally stable w/ no new complaints or concerns... she saw [name] 1/11- chr AFib doing well, no changes made, f/u 43yr... she stopped her Bisphos Rx (Actonel) in 2010 & refused to restart "I do Yogurt"- offered the new ATELVIA (easier to take, coated for less GI side effects) & she will consider this.   Current Problem List:  MACULAR DEGENERATION (ICD-362.50) - she's followed in the [facility] [place] clinic... on Eye Vits.  ATRIAL FIBRILLATION, CHRONIC (ICD-427.31) - on ASA 81mg /d... 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 HR=80 today and she feels well... denies CP, palpit, dizzy, etc...  ~  Cards f/u [name] 1/11- doing well, no changes, f/u planned 22yr.  VENOUS INSUFFICIENCY (ICD-459.81) - she follows a low sodium diet... not on diuretic therapy.  HYPERLIPIDEMIA (ICD-272.4) - on SIMVASTATIN 20mg /d now (changed from Vytorin to save $$).  ~  FLP was 6/08 showing TChol 135, TG 90, HDL 56, LDL 61...  ~  FLP 2/09 showed TChol 140, TG 99, HDL 59, LDL 62... great!  ~  FLP 2/10 on Vytor10/20 showed TChol 133, TG 83, HDL 56, LDL 60... rec> change to Simva20.  ~  FLP 8/10 on Simva20 showed TChol 143, TG 99, HDL 51, LDL 73  GERD (ICD-530.81) - she takes PRILOSEC 20mg /d...  DIVERTICULOSIS OF COLON (ICD-562.10) - no abd pain or episodes of diverticulitis...  ANGIODYSPLASIA, COLON (YHC-623.76) - last colonoscopy was 10/05 by [name] showing divertics and colon AVMs... f/u planned in 27yrs.  DEGENERATIVE JOINT DISEASE (ICD-715.90) - only c/o some hand pain... uses OTC meds as needed... her  main exercise is "up and down the steps 40-11 times a day"...  OSTEOPOROSIS (ICD-733.00) - prev took Actonel but stopped on her own 2010...  takes Ca++, MVI, VitD OTC... her last BMD was 3/08 by [name] for GYN and he follows this (she gets BMD and Mammograms at his office)... she states INTOL to Alendronate "it made me sick"...  ~  labs 2/10 showed Vit D level = 29... rec> add Vit D 1000 u OTC daily.  ~  labs 8/10 showed Vit D level = 31... rec> continue 1000 u daily.  ANXIETY (ICD-300.00) - she has ALPRAZOLAM 0.5mg  - 1/2 to 1 tab Tid Prn...   Allergies: 1)  ! Penicillin 2)  ! Lipitor (Atorvastatin Calcium) 3)  ! Fosamax (Alendronate Sodium)  Comments:  Nurse/Medical Assistant: The patient's medications and allergies were reviewed with the patient and were updated in the Medication and Allergy Lists.  Past History:  Past Medical History:  MACULAR DEGENERATION (ICD-362.50) ATRIAL FIBRILLATION, CHRONIC (ICD-427.31) VENOUS INSUFFICIENCY (ICD-459.81) HYPERLIPIDEMIA (ICD-272.4) GERD (ICD-530.81) DIVERTICULOSIS OF COLON (ICD-562.10) ANGIODYSPLASIA, COLON (ICD-569.84) DEGENERATIVE JOINT DISEASE (ICD-715.90) OSTEOPOROSIS (ICD-733.00) ANXIETY (ICD-300.00)  Past Surgical History: Appendectomy (2831) Tonsillectomy (1943)  Family History: Reviewed history from 04/02/2008 and no changes required. Sister - heart disease - (atrial fib) Sister - breast cancer Sister - lymphoma Mother - died at age 77 Father - deceased at age 90, hx hemochromatosis  Social History: Reviewed history from 04/01/2009 and no changes required. Single No children Retired  Tobacco Use - No.  Alcohol Use - no Regular Exercise - no  Review of Systems      See HPI       The patient complains of dyspnea on exertion.  The patient denies anorexia, fever, weight loss, weight gain, vision loss, decreased hearing, hoarseness, chest pain, syncope, peripheral edema, prolonged cough, headaches, hemoptysis,  abdominal pain, melena, hematochezia, severe indigestion/heartburn, hematuria, incontinence, muscle weakness, suspicious skin lesions, transient blindness, difficulty walking, depression, unusual weight change, abnormal bleeding, enlarged lymph nodes, and angioedema.    Vital Signs:  Patient profile:   77 year old female Height:      66 inches Weight:      155.25 pounds O2 Sat:      96 % on Room air Temp:     97.0 degrees F oral Pulse rate:   79 / minute BP sitting:   118 / 72  (left arm) Cuff size:   regular  Vitals Entered By: Randell Loop CMA (October 01, 2009 10:07 AM)  O2 Sat at Rest %:  96 O2 Flow:  Room air CC: 6 month ROV & review of mult medical problems... Is Patient Diabetic? No Pain Assessment Patient in pain? no      Comments no changes in meds    Physical Exam  Additional Exam:  WD, WN, 77 y/o WF in NAD... GENERAL:  Alert & oriented; pleasant & cooperative... HEENT:  [place]/AT, EOM-wnl, PERRLA, EACs-clear, TMs-wnl, NOSE-clear, THROAT-clear & wnl. NECK:  Supple w/ fairROM; no JVD; normal carotid impulses w/o bruits; no thyromegaly or nodules palpated; no lymphadenopathy. CHEST:  Clear to P & A; without wheezes/ rales/ or rhonchi... HEART:  irreg w/ controlled response, without murmurs/ rubs/ or gallops heard... ABDOMEN:  Soft & nontender; normal bowel sounds; no organomegaly or masses detected. EXT: without deformities, mild arthritic changes; no varicose veins/ +venous insuffic/ no edema. NEURO:  CN's intact; no focal neuro deficits... DERM:  No lesions noted; no rash etc...   Impression & Recommendations:  Problem # 1:  ATRIAL FIBRILLATION, CHRONIC (ICD-427.31) Stable rate control strategy... CHADS2 score = 1 and she is on ASA 81mg /d... same meds. Her updated medication list for this problem includes:    Adult Aspirin Ec Low Strength 81 Mg Tbec (Aspirin) .Marland Kitchen... Take 2 tablets by mouth once daily    Metoprolol Succinate 25 Mg Xr24h-tab (Metoprolol succinate)  .Marland Kitchen... Take 1 tablet by mouth once a day    Cardizem Cd 180 Mg Cp24 (Diltiazem hcl coated beads) .Marland Kitchen... 1 tab by mouth once daily  Problem # 2:  HYPERLIPIDEMIA (ICD-272.4) Stable on Simva20... continue same. Her updated medication list for this problem includes:    Simvastatin 20 Mg Tabs (Simvastatin) .Marland Kitchen... Take 1 tab by mouth at bedtime...  Problem # 3:  GERD (ICD-530.81) GI is stable- continue meds. Her updated medication list for this problem includes:    Prilosec Otc 20 Mg Tbec (Omeprazole magnesium) .Marland Kitchen... Take 1 tablet by mouth once a day  Problem # 4:  ANGIODYSPLASIA, COLON (ICD-569.84) Aware-  no obvious bleeding, stable...  Problem # 5:  DEGENERATIVE JOINT DISEASE (ICD-715.90) Stable-  continue same meds. Her updated medication list for this problem includes:    Adult Aspirin Ec Low Strength 81 Mg Tbec (Aspirin) .Marland Kitchen... Take 2 tablets by mouth once daily  Problem # 6:  OSTEOPOROSIS (ICD-733.00) We discussed restarting Bisphos Rx in the form of ATELVIA...   Problem # 7:  OTHER MEDICAL PROBLEMS AS NOTED>>>  Complete Medication List: 1)  Adult Aspirin Ec Low Strength 81 Mg Tbec (Aspirin) .... Take 2 tablets by mouth once daily 2)  Metoprolol Succinate 25 Mg Xr24h-tab (Metoprolol succinate) .... Take 1 tablet by mouth once a day 3)  Cardizem Cd 180 Mg Cp24 (Diltiazem hcl coated beads) .Marland Kitchen.. 1 tab by mouth once daily 4)  Simvastatin 20 Mg Tabs (Simvastatin) .... Take 1 tab by mouth at bedtime.Marland KitchenMarland Kitchen 5)  Prilosec Otc 20 Mg Tbec (Omeprazole magnesium) .... Take 1 tablet by mouth once a day 6)  Caltrate 600+d 600-400 Mg-unit Tabs (Calcium carbonate-vitamin d) .Marland Kitchen.. 1 tab twice daily.Marland KitchenMarland Kitchen 7)  Eye Vitamins Tabs (Multiple vitamins-minerals) .... Take one tablet daiy. 8)  Centrum Silver Tabs (Multiple vitamins-minerals) .... Take 1 tablet by mouth once a day as needed 9)  Vitamin D 1000 Unit Tabs (Cholecalciferol) .... Once daily 10)  Alprazolam 0.5 Mg Tabs (Alprazolam) .... Take 1/2 to 1 tab by  mouth three times a day as needed for nerves... 11)  Atelvia 35mg  Tabs  .... Take 1 tab by mouth each week as directed for osteoporosis...  Other Orders: Prescription Created Electronically [phone])  Patient Instructions: 1)  Today we updated your med list- see below.... 2)  Continue your current meds the same... 3)  We decided to try the new ACTONEL= ATELVIA one tab weekly (it is better tolerated than the Actonel). 4)  Let's plan a follow up visit in 6 months, and we will check your FASTING blood work at that time... Prescriptions: ATELVIA 35MG  TABS take 1 tab by mouth each week as directed for osteoporosis...  #4 x prn   Entered and Authorized by:   Michele Mcalpine MD   Signed by:   Michele Mcalpine MD on 10/01/2009   Method used:   Print then Give to Patient   RxID:   [id number]

## 2010-09-24 NOTE — Assessment & Plan Note (Signed)
Summary: follow up      Allergies Added:   Primary Provider:  [name]   History of Present Illness: Lisa Douglas is a delightful 77 year old woman with a history of hyperlipidemia, gastric AVMs, gastroesophageal reflux disease, and chronic atrial fibrillation with a CHADS2 score of 1 (age > 47) - unable to take coumadin due to AVMs.  She returns today for routine yearly followup.   Overall, Lisa Douglas is doing quite well.  Denies CP, SOB or palpitations. Tries to keep busy and goes out for a walk when it is warm out. Constantly up and down her steps at home without problem. Occasional ankle swelling at night. no bleeding or neuro symptoms. Taking ASA every day.   Current Medications (verified): 1)  Adult Aspirin Ec Low Strength 81 Mg  Tbec (Aspirin) .... Take 2 Tablets By Mouth Once Daily 2)  Metoprolol Succinate 25 Mg Xr24h-Tab (Metoprolol Succinate) .... Take 1 Tablet By Mouth Once A Day 3)  Cardizem Cd 180 Mg  Cp24 (Diltiazem Hcl Coated Beads) .Marland Kitchen.. 1 Tab By Mouth Once Daily 4)  Simvastatin 20 Mg Tabs (Simvastatin) .... Take 1 Tab By Mouth At Bedtime.Marland KitchenMarland Kitchen 5)  Prilosec Otc 20 Mg  Tbec (Omeprazole Magnesium) .... Take 1 Tablet By Mouth Once A Day 6)  Caltrate 600+d 600-400 Mg-Unit  Tabs (Calcium Carbonate-Vitamin D) .Marland Kitchen.. 1 Tab Twice Daily.Marland KitchenMarland Kitchen 7)  Eye Vitamins   Tabs (Multiple Vitamins-Minerals) .... Take One Tablet Daiy. 8)  Centrum Silver   Tabs (Multiple Vitamins-Minerals) .... Take 1 Tablet By Mouth Once A Day As Needed 9)  Vitamin D 1000 Unit  Tabs (Cholecalciferol) .... Once Daily 10)  Alprazolam 0.5 Mg  Tabs (Alprazolam) .... Take 1/2 To 1 Tab By Mouth Three Times A Day As Needed For Nerves...  Allergies (verified): 1)  ! Penicillin 2)  ! Lipitor (Atorvastatin Calcium) 3)  ! Fosamax (Alendronate Sodium)  Past History:  Past Medical History: Last updated: 03/31/2010 MACULAR DEGENERATION (ICD-362.50) ATRIAL FIBRILLATION, CHRONIC (ICD-427.31) VENOUS INSUFFICIENCY (ICD-459.81) HYPERLIPIDEMIA  (ICD-272.4) GERD (ICD-530.81) DIVERTICULOSIS OF COLON (ICD-562.10) ANGIODYSPLASIA, COLON (ICD-569.84) DEGENERATIVE JOINT DISEASE (ICD-715.90) OSTEOPOROSIS (ICD-733.00) ANXIETY (ICD-300.00)  Review of Systems       As per HPI and past medical history; otherwise all systems negative.   Vital Signs:  Patient profile:   77 year old female Height:      66 inches Weight:      159 pounds BMI:     25.76 Pulse rate:   77 / minute Resp:     16 per minute BP sitting:   124 / 76  (right arm)  Vitals Entered By: Marrion Coy, CNA (September 17, 2010 10:45 AM)  Physical Exam  General:  She is well-appearing and in no acute distress.  She ambulates around the clinic without any respiratory difficulty. HEENT:  Normal. NECK:  Supple.  No JVD.  Carotids are 2+ bilaterally without bruits. There is no lymphadenopathy or thyromegaly. CARDIAC:  PMI is nondisplaced.  She is irregularly irregular.  No murmurs, rubs, or gallops. LUNGS:  Clear. ABDOMEN:  Soft, nontender, and nondistended.  Good bowel sounds.   EXTREMITIES:  Warm with no cyanosis, clubbing, tr ankle edema on R NEUROLOGIC:  Alert and oriented x3.  Cranial nerves II through XII are grossly intact.  Moves all 4 extremities without difficulty.  Affect is pleasant.    Impression & Recommendations:  Problem # 1:  ATRIAL FIBRILLATION, CHRONIC (ICD-427.31) Doing well with chronic AF. Rate controlled. Tolerating ASA. Not a coumadin candidate due to AVMs.  Other Orders: EKG w/ Interpretation (93000)  Patient Instructions: 1)  Your physician wants you to follow-up in: 1 year.  You will receive a reminder letter in the mail two months in advance. If you don't receive a letter, please call our office to schedule the follow-up appointment. 2)  Your physician recommends that you continue on your current medications as directed. Please refer to the Current Medication list given to you today.

## 2010-09-30 ENCOUNTER — Other Ambulatory Visit: Payer: Self-pay | Admitting: Pulmonary Disease

## 2010-09-30 ENCOUNTER — Ambulatory Visit (INDEPENDENT_AMBULATORY_CARE_PROVIDER_SITE_OTHER)
Admission: RE | Admit: 2010-09-30 | Discharge: 2010-09-30 | Disposition: A | Discharge status: Home / Self Care | Payer: Medicare Other | Source: Ambulatory Visit | Attending: Pulmonary Disease | Admitting: Pulmonary Disease

## 2010-09-30 ENCOUNTER — Encounter: Payer: Self-pay | Admitting: Pulmonary Disease

## 2010-09-30 ENCOUNTER — Ambulatory Visit (INDEPENDENT_AMBULATORY_CARE_PROVIDER_SITE_OTHER): Payer: Medicare Other | Admitting: Pulmonary Disease

## 2010-09-30 DIAGNOSIS — E785 Hyperlipidemia, unspecified: Secondary | ICD-10-CM

## 2010-09-30 DIAGNOSIS — J069 Acute upper respiratory infection, unspecified: Secondary | ICD-10-CM

## 2010-09-30 DIAGNOSIS — K5730 Diverticulosis of large intestine without perforation or abscess without bleeding: Secondary | ICD-10-CM

## 2010-09-30 DIAGNOSIS — I872 Venous insufficiency (chronic) (peripheral): Secondary | ICD-10-CM

## 2010-09-30 DIAGNOSIS — I4891 Unspecified atrial fibrillation: Secondary | ICD-10-CM

## 2010-09-30 DIAGNOSIS — H3530 Unspecified macular degeneration: Secondary | ICD-10-CM

## 2010-09-30 DIAGNOSIS — K219 Gastro-esophageal reflux disease without esophagitis: Secondary | ICD-10-CM

## 2010-09-30 DIAGNOSIS — K222 Esophageal obstruction: Secondary | ICD-10-CM

## 2010-09-30 DIAGNOSIS — K5520 Angiodysplasia of colon without hemorrhage: Secondary | ICD-10-CM

## 2010-10-13 ENCOUNTER — Other Ambulatory Visit: Payer: Self-pay | Admitting: Pulmonary Disease

## 2010-10-13 ENCOUNTER — Telehealth (INDEPENDENT_AMBULATORY_CARE_PROVIDER_SITE_OTHER): Payer: Self-pay | Admitting: *Deleted

## 2010-10-13 DIAGNOSIS — Z1231 Encounter for screening mammogram for malignant neoplasm of breast: Secondary | ICD-10-CM

## 2010-10-14 NOTE — Assessment & Plan Note (Signed)
Summary: 6 mth//apc   Primary Care Provider:  [name]  CC:  6 month ROV & review of mult medical problems....  History of Present Illness: 77 y/o WF here for a follow up visit... he has multiple medical problems as noted below...    ~  EAV40:  her stress level is reduced since [name] passed & her caregiver role has diminished... generally stable w/ no new complaints or concerns... she saw [name] 1/11- chr AFib doing well, no changes made, f/u 32yr... she stopped her Bisphos Rx (Actonel) in 2010 & refused to restart "I do Yogurt"- offered the new ATELVIA (easier to take, coated for less GI side effects) & she will consider this.  ~  Aug11:  she's had a good 31mo- no new complaints or concerns... she tripped & fell down 3 steps at home- no injury but great concern because of her [name]- last BMD [name] 2008 & she needs f/u BMD but declines "I'll think about it" and still refuses meds- pills, shots, IV, etc... otherw stable- denies CP, palpit, SOB, etc;  BP controlled;  Lipids have been good on the simva20;  Alpraz refilled today & fasting labs done...   ~  September 30, 2010:  she's had another good 31mo- no new complaints or concerns... she saw [name] 1/12 for f/u chr AFib, unable to take Coumadin due to AVMs, on daily ASA/ Metoprolol/ Cardizem, doing well- no changes made...  she had BMD done by GYN, [name]- TScore -2.5 spine & -2.4 left FemNeck (she has refused meds & he didn't change management)...     Today she requests CXR "I wheeze" although exam is clear at present (CXR showed min basilar scarring, otherw neg);  still followed at [facility] for macular degen but she states vision is fine;  Lipids stable on Simva20;  GI followed by [name] & OK;  mod DJD but active at home she says...   Current Problem List:  MACULAR DEGENERATION (ICD-362.50) - she's followed in the [facility] Q57mo... on Eye Vits and stable...  ATRIAL FIBRILLATION, CHRONIC (ICD-427.31) - on ASA  81mg /d... 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 HR=90 today and she feels well, BP= 110/68... denies CP, palpit, dizzy, SOB, etc...  ~  Cards f/u [name] 1/12- doing well, no changes, f/u planned 31yr.  VENOUS INSUFFICIENCY (ICD-459.81) - she follows a low sodium diet... not on diuretic therapy.  HYPERLIPIDEMIA (ICD-272.4) - on SIMVASTATIN 20mg /d now (changed from Vytorin to save $$).  ~  FLP was 6/08 showing TChol 135, TG 90, HDL 56, LDL 61...  ~  FLP 2/09 showed TChol 140, TG 99, HDL 59, LDL 62... great!  ~  FLP 2/10 on Vytor10/20 showed TChol 133, TG 83, HDL 56, LDL 60... rec> change to Simva20.  ~  FLP 8/10 on Simva20 showed TChol 143, TG 99, HDL 51, LDL 73  ~  FLP 8/11 on Simva20 showed TChol 147, TG 103, HDL 54, LDL 72  GERD (ICD-530.81) - she takes PRILOSEC 20mg /d as needed...  DIVERTICULOSIS OF COLON (ICD-562.10) - no abd pain or episodes of diverticulitis... ANGIODYSPLASIA, COLON (JWJ-191.47) - last colonoscopy was 10/05 by [name] showing divertics and colon AVMs...  DEGENERATIVE JOINT DISEASE (ICD-715.90) - only c/o some hand pain... uses OTC meds as needed... her main exercise is "up and down the steps 40-11 times a day"...  OSTEOPOROSIS (ICD-733.00) - prev took Actonel but stopped on her own 2010...  takes  Ca++, MVI, VitD OTC... her last BMD was 3/08 by [name] for GYN and he follows this (she gets BMD and Mammograms at his office)... she states INTOL to Alendronate "it made me sick" & refuses to restart meds for her bones (either pills, shots, or IV Rx)...  ~  labs 2/10 showed Vit D level = 29... rec> add Vit D 1000 u OTC daily.  ~  labs 8/10 showed Vit D level = 31... rec> continue 1000 u daily.  ~  BMD 10/11 by [name] showed TScores -2.5 in Spine, and -2.4 in left FemNeck...  ANXIETY (ICD-300.00) - she has ALPRAZOLAM 0.5mg  - 1/2 to 1 tab Tid Prn...   Preventive Screening-Counseling &  Management  Alcohol-Tobacco     Smoking Status: never     Passive Smoke Exposure: yes  Allergies: 1)  ! Penicillin 2)  ! Lipitor (Atorvastatin Calcium) 3)  ! Fosamax (Alendronate Sodium)  Comments:  Nurse/Medical Assistant: The patient's medications and allergies were reviewed with the patient and were updated in the Medication and Allergy Lists.  Past History:  Past Medical History: MACULAR DEGENERATION (ICD-362.50) ATRIAL FIBRILLATION, CHRONIC (ICD-427.31) VENOUS INSUFFICIENCY (ICD-459.81) HYPERLIPIDEMIA (ICD-272.4) GERD (ICD-530.81) DIVERTICULOSIS OF COLON (ICD-562.10) ANGIODYSPLASIA, COLON (ICD-569.84) DEGENERATIVE JOINT DISEASE (ICD-715.90) OSTEOPOROSIS (ICD-733.00) ANXIETY (ICD-300.00)  Past Surgical History: Appendectomy (1610) Tonsillectomy (1943)  Family History: Reviewed history from 03/31/2010 and no changes required. Sister - heart disease - (atrial fib) Sister - breast cancer Sister - lymphoma Mother - died at age 31 Father - deceased at age 24, hx hemochromatosis  Social History: Reviewed history from 03/31/2010 and no changes required. Single No children Retired  Tobacco Use - No.  Alcohol Use - no Regular Exercise - no  Review of Systems      See HPI       The patient complains of dyspnea on exertion.  The patient denies anorexia, fever, weight loss, weight gain, vision loss, decreased hearing, hoarseness, chest pain, syncope, peripheral edema, prolonged cough, headaches, hemoptysis, abdominal pain, melena, hematochezia, severe indigestion/heartburn, hematuria, incontinence, muscle weakness, suspicious skin lesions, transient blindness, difficulty walking, depression, unusual weight change, abnormal bleeding, enlarged lymph nodes, and angioedema.    Vital Signs:  Patient profile:   77 year old female Height:      66 inches Weight:      158 pounds BMI:     25.59 O2 Sat:      95 % on Room air Temp:     96.8 degrees F oral Pulse rate:   91  / minute BP sitting:   110 / 68  (right arm) Cuff size:   regular  Vitals Entered By: Randell Loop CMA (September 30, 2010 10:26 AM)  O2 Sat at Rest %:  95 O2 Flow:  Room air CC: 6 month ROV & review of mult medical problems... Is Patient Diabetic? No Pain Assessment Patient in pain? no      Comments no changes in meds today   Physical Exam  Additional Exam:  WD, WN, 77 y/o WF in NAD... GENERAL:  Alert & oriented; pleasant & cooperative... HEENT:  [place]/AT, EOM-wnl, PERRLA, EACs-clear, TMs-wnl, NOSE-clear, THROAT-clear & wnl. NECK:  Supple w/ fairROM; no JVD; normal carotid impulses w/o bruits; no thyromegaly or nodules palpated; no lymphadenopathy. CHEST:  Clear to P & A; without wheezes/ rales/ or rhonchi... HEART:  irreg w/ controlled response, without murmurs/ rubs/ or gallops heard... ABDOMEN:  Soft & nontender; normal bowel sounds; no organomegaly or masses detected. EXT: without deformities, mild  arthritic changes; no varicose veins/ +venous insuffic/ no edema. NEURO:  CN's intact; no focal neuro deficits... DERM:  No lesions noted; no rash etc...    Impression & Recommendations:  Problem # 1:  ATRIAL FIBRILLATION, CHRONIC (ICD-427.31) Followed for cards by drBensinhon, seen 1/12 & stable on rate control strategy, no changes made... Her updated medication list for this problem includes:    Adult Aspirin Ec Low Strength 81 Mg Tbec (Aspirin) .Marland Kitchen... Take 2 tablets by mouth once daily    Metoprolol Succinate 25 Mg Xr24h-tab (Metoprolol succinate) .Marland Kitchen... Take 1 tablet by mouth once a day    Cardizem Cd 180 Mg Cp24 (Diltiazem hcl coated beads) .Marland Kitchen... 1 tab by mouth once daily  Orders: T-2 View CXR (71020TC) >> min basialr scarring, NAD...  Problem # 2:  VENOUS INSUFFICIENCY (ICD-459.81) No change>  she knows to elim sodium, elevate, wear support hose as needed.  Problem # 3:  HYPERLIPIDEMIA (ICD-272.4) Stable on diet + Simva20... Her updated medication list for this  problem includes:    Simvastatin 20 Mg Tabs (Simvastatin) .Marland Kitchen... Take 1 tab by mouth at bedtime...  Problem # 4:  ANGIODYSPLASIA, COLON (ICD-569.84) GI is stable, followed by [name], no bleeding apparent...  Problem # 5:  DEGENERATIVE JOINT DISEASE (ICD-715.90) She treats self w/ OTC meds Prn... Her updated medication list for this problem includes:    Adult Aspirin Ec Low Strength 81 Mg Tbec (Aspirin) .Marland Kitchen... Take 2 tablets by mouth once daily  Problem # 6:  OSTEOPOROSIS (ICD-733.00) She refuses bisphos or other meds for Osteop... BMD as noted by [name], GYN.  Problem # 7:  ANXIETY (ICD-300.00) Aware> still some stress in the family, Greggory Stallion, etc... Her updated medication list for this problem includes:    Alprazolam 0.5 Mg Tabs (Alprazolam) .Marland Kitchen... Take 1/2 to 1 tab by mouth three times a day as needed for nerves...  Complete Medication List: 1)  Adult Aspirin Ec Low Strength 81 Mg Tbec (Aspirin) .... Take 2 tablets by mouth once daily 2)  Metoprolol Succinate 25 Mg Xr24h-tab (Metoprolol succinate) .... Take 1 tablet by mouth once a day 3)  Cardizem Cd 180 Mg Cp24 (Diltiazem hcl coated beads) .Marland Kitchen.. 1 tab by mouth once daily 4)  Simvastatin 20 Mg Tabs (Simvastatin) .... Take 1 tab by mouth at bedtime.Marland KitchenMarland Kitchen 5)  Prilosec Otc 20 Mg Tbec (Omeprazole magnesium) .... Take 1 tablet by mouth once a day 6)  Caltrate 600+d 600-400 Mg-unit Tabs (Calcium carbonate-vitamin d) .Marland Kitchen.. 1 tab twice daily.Marland KitchenMarland Kitchen 7)  Eye Vitamins Tabs (Multiple vitamins-minerals) .... Take one tablet daiy. 8)  Centrum Silver Tabs (Multiple vitamins-minerals) .... Take 1 tablet by mouth once a day as needed 9)  Vitamin D 1000 Unit Tabs (Cholecalciferol) .... Once daily 10)  Alprazolam 0.5 Mg Tabs (Alprazolam) .... Take 1/2 to 1 tab by mouth three times a day as needed for nerves...  Patient Instructions: 1)  Today we updated your med list- see below.... 2)  Continue your current meds the same... 3)  Today we did your follow up  CXR... please call the "phone tree" in a few days for your results.Marland KitchenMarland Kitchen 4)  Call for any problems.Marland KitchenMarland Kitchen 5)  Please schedule a follow-up appointment in 6 months.

## 2010-10-20 NOTE — Progress Notes (Signed)
Summary: needs to schedule a mammogram  Phone Note Call from Patient Call back at Home Phone [phone]   Caller: Patient Call For: NADEL Summary of Call: patient phoned stated that Dr [name] wanted her to schedule a mammogram and she needs to speak to the nurse to tell her when she can go for this. Paitent can be reached at [phone] Initial call taken by: Vedia Coffer,  October 13, 2010 10:00 AM  Follow-up for Phone Call        Pt reports that she cannot go for mammogram until after April 6th due to Medicare guidelines.  Order sent to [facility].  Pt reports that Dr [name] wants her to have this at [facility] hospital. Abigail Miyamoto RN  October 13, 2010 10:36 AM   New Problems: FAMILY HISTORY OF MALIGNANT NEOPLASM OF BREAST (ICD-V16.3)   New Problems: FAMILY HISTORY OF MALIGNANT NEOPLASM OF BREAST (ICD-V16.3)

## 2010-12-01 ENCOUNTER — Ambulatory Visit (HOSPITAL_COMMUNITY)
Admission: RE | Admit: 2010-12-01 | Discharge: 2010-12-01 | Disposition: A | Discharge status: Home / Self Care | Payer: Medicare Other | Source: Ambulatory Visit | Attending: Pulmonary Disease | Admitting: Pulmonary Disease

## 2010-12-01 DIAGNOSIS — Z1231 Encounter for screening mammogram for malignant neoplasm of breast: Secondary | ICD-10-CM | POA: Insufficient documentation

## 2011-01-05 NOTE — Assessment & Plan Note (Signed)
[facility]                            CARDIOLOGY OFFICE NOTE   Lisa Douglas                        MRN:          [id number]  DATE:07/25/2007                            DOB:          Jun 04, 1928    PRIMARY CARE PHYSICIAN:  Lisa Cloud. [name], MD.   INTERVAL HISTORY:  Lisa Douglas is a delightful 77 year old woman with a  history of hyperlipidemia, gastric AVMs, gastroesophageal reflux  disease, and chronic atrial fibrillation.  She returns today for routine  followup.   At her last visit, we switched her over from diltiazem to Toprol; she  was unable to tolerate this.  We switched her back to diltiazem.  She  says she feels much better. She denies any palpitations, chest pain, or  shortness of breath.  She has not had any lower extremity edema.  She  notes that, when she was a child, her mother always told her she had a  fast heart beat.   CURRENT MEDICATIONS:  1. Actonel 35 mg a week.  2. Calcium.  3. Aspirin 81 mg a day.  4. Vytorin 20/10.  5. Centrum Silver.  6. Vitamin D.  7. Diltiazem 180 a day.   PHYSICAL EXAMINATION:  GENERAL: She is well appearing, no acute  distress.  Ambulates around the clinic without an difficulty.  VITAL SIGNS:  Blood pressure 122/78, heart rate 98, weight 152.  HEENT:  Normal.  NECK:  Supple.  No JVD.  Carotids are 2+ bilaterally without bruits.  There is no lymphadenopathy or thyromegaly.  CARDIAC:  PMI not displaced.  Heart rate is irregular with no murmurs,  rubs, or gallops.  LUNGS:  Clear.  ABDOMEN:  Soft, nontender, nondistended.  No hepatosplenomegaly, no  bruits, no masses.  Good bowel sounds.  EXTREMITIES: Warm with no cyanosis, clubbing, or edema.  No rash.  NEUROLOGIC:  Alert and oriented x3.  Cranial nerves II-XII intact. Moves  all four extremities without difficulty.  Affect is pleasant.   EKG shows atrial fibrillation with occasional PVCs.  Ventricular  response is 98 beats per minute.   ASSESSMENT AND PLAN:  1. Atrial fibrillation.  Her rate remains a little bit on the high      side.  We will increase diltiazem to 240 a day.  She is intolerant      to COUMADIN due to gastric AVMs.  She will maintain on aspirin.      Will possibly need to consider increasing to 325 mg a day as      tolerated.  2. Cardiac evaluation.  She has never had risk stratification for      coronary artery disease.  We did discuss the possibility of Myoview      and will schedule this for the beginning of next year as she would      like to wait until after the holidays.   She will return to the clinic in several months in routine followup.     Lisa Buckles. Bensimhon, MD  Electronically Signed    DRB/MedQ  DD: 07/25/2007  DT: 07/25/2007  Job #:  [id number]   cc:   Lisa Cloud. [name], MD

## 2011-01-05 NOTE — Assessment & Plan Note (Signed)
[facility]                            CARDIOLOGY OFFICE NOTE   EARNESTENE, Lisa Douglas                        MRN:          [id number]  DATE:05/31/2007                            DOB:          1927-12-29    REFERRING PHYSICIAN:  Lonzo Cloud. [name], MD   REASON FOR REFERRAL:  Atrial fibrillation.   HISTORY OF PRESENT ILLNESS:  Lisa Douglas is a very pleasant 77 year old  woman with a past medical history of hyperlipidemia, gastric AVM and  gastroesophageal reflux disease who presents for further evaluation of  new-onset atrial fibrillation.  Her atrial fibrillation was discovered  at a routine office visit.  She denies any palpitations, chest pain, or  shortness of breath.  She spends a lot of her time taking care of an  elderly sister and is fairly active with this, but does not participate  in a routine exercise program.  She denies any chest pain.  She has  never had previous cardiac evaluation.   REVIEW OF SYSTEMS:  Negative for orthopnea, PND, syncope, or pre-  syncope.   PAST MEDICAL HISTORY:  1. Hyperlipidemia.  2. Gastric AVM.  3. Hiatal hernia and gastroesophageal reflux disease.   CURRENT MEDICATIONS:  1. Actonel 35 mg a week.  2. Calcium and vitamin D.  3. Aspirin 81.  4. Vytorin 20/10.  5. Prilosec 20 a day.  6. Multivitamin.  7. Diltiazem 30 mg q.6h.   ALLERGIES:  PENICILLIN.   SOCIAL HISTORY:  She has never been married.  No children.  Denies any  significant alcohol use.  She smoked, but quit in 1963.   FAMILY HISTORY:  Mother died at age 51 due to pulmonary embolism.  Father died at age 50 due to hemochromatosis.  There is no family  history of premature coronary artery disease.  She did have a brother  died of a heart attack at age 31.   PHYSICAL EXAM:  She is an elderly woman in no acute distress.  She  ambulates around the clinic briskly without any respiratory difficulty.  Blood pressure is 140/80, heart rate is 101, weight  149.  HEENT:  Normal.  NECK:  Supple.  There is no JVD.  Carotids are 2+ bilaterally without  any bruits.  There is no lymphadenopathy or thyromegaly.  CARDIAC:  PMI is not displaced.  She is irregular.  Slightly  tachycardic.  No murmur.  LUNGS:  Clear.  ABDOMEN:  Soft, nontender, nondistended.  No hepatosplenomegaly.  No  bruits.  No masses.  Good bowel sounds.  EXTREMITIES:  Warm with no cyanosis, clubbing, or edema.  No rash.  NEURO:  Alert and oriented x3.  Cranial nerves 2-12 are intact.  Moves  all 4 extremities without difficulty.  Affect is very pleasant.   EKG shows atrial fibrillation with a ventricular rate of 101.  No  significant ST-T wave abnormalities.   ASSESSMENT AND PLAN:  Atrial fibrillation with slightly rapid  ventricular response.  At this time, she is asymptomatic and rate  control is suboptimal.  She is having trouble taking her Diltiazem 4  times a day.  We will switch her over to Toprol XL 100 mg a day.  To  help improve her rate control, we will stop her Diltiazem.  We had a  long talk about Coumadin, but give her gastric arteriovenous  malformations, Dr. [name] feels strongly that she would not tolerate  anticoagulation.  We will, thus, increase her aspirin to 325 mg a day.  We also did discuss briefly cardioversion, but she is asymptomatic and  relatively uninterested in this.  Given the fact that we cannot anti-  coagulate with her Coumadin, I think it is just best to leave her in  atrial fibrillation.  I will get a 2D echocardiogram.  She has already  had a normal thyroid panel.   CARDIOVASCULAR RISK SCREENING:  Once we get her heart rate under good  control, I think it is reasonable to proceed with routine Myoview to  rule out any underlying coronary disease.     Bevelyn Buckles. Bensimhon, MD  Electronically Signed    DRB/MedQ  DD: 05/31/2007  DT: 06/01/2007  Job #: [id number]

## 2011-01-05 NOTE — Assessment & Plan Note (Signed)
[facility]                            CARDIOLOGY OFFICE NOTE   [name], [name]                        MRN:          [id number]  DATE:05/02/2008                            DOB:          1928-08-01    PRIMARY CARE PHYSICIAN:  Lisa Cloud. [name], MD   INTERVAL HISTORY:  Lisa Douglas is a pleasant 77 year old woman with a  history of hyperlipidemia, gastric AVMs, gastroesophageal reflux  disease, and chronic atrial fibrillation.  She returns today for routine  followup.   At her last visit, we asked her to increase her aspirin to 325 mg a day  as she was unable tolerate Coumadin.  However, Dr. Kriste Douglas asked her keep  her dose at 162, so she has been taking that.  She says she is doing  great.  Denies any chest pain or shortness of breath, says she is fairly  active.  She has been taking her blood pressure and heart rates at home  and noticing her heart rates about chronically 85.  We previously tried  to increase her diltiazem to 240 a day but had to cut back due to lower  extremity edema.  This has resolved.   CURRENT MEDICATIONS:  1. Prilosec 20 a day.  2. Actonel 35 a week.  3. Aspirin 162.  4. Vitamin D and calcium.  5. Centrum Silver.  6. Vytorin 10/20.  7. Diltiazem 180 a day.   PHYSICAL EXAMINATION:  GENERAL:  She is well-appearing in no acute  distress, ambulates around the clinic without respiratory difficulty.  VITAL SIGNS:  Blood pressure is 120/70, heart rate is 94, weight is 154  which is stable.  HEENT:  Normal.  NECK:  Supple.  No JVD.  Carotids are 2+ bilaterally without any bruits.  There is no lymphadenopathy or thyromegaly.  CARDIAC:  PMI is nondisplaced.  She is irregularly irregular with no  murmurs, rubs, or gallops.  LUNGS:  Clear.  ABDOMEN:  Soft, nontender, and nondistended.  No hepatosplenomegaly.  No  bruits.  No masses.  Good bowel sounds.  EXTREMITIES:  Warm with no cyanosis, clubbing, or edema.  No rash.  NEUROLOGIC:  Alert and oriented x3.  Cranial nerves II through XII are  intact.  Moves all 4 extremities without difficulty.  Affect is  pleasant.   EKG shows afib with a ventricular response of 94.   ASSESSMENT/PLAN:  Chronic atrial fibrillation.  She is doing well.  Her  rate is a little bit up.  We have talked about adding Toprol.  She says  this made her feel horrible in the past as previously she was on 100  once a day.  We have negotiated and she will try 25 a day.  If she is  unable to tolerate this, could try a little bit of digoxin.  She will  continue on aspirin as she is unable to tolerate Coumadin due to GI  bleeding.   We will see her back in 4-6 months for routine followup.     Bevelyn Buckles. Bensimhon, MD  Electronically Signed    DRB/MedQ  DD: 05/02/2008  DT: 05/03/2008  Job #: [id number]

## 2011-01-05 NOTE — Assessment & Plan Note (Signed)
[facility]                            CARDIOLOGY OFFICE NOTE   GEORJEAN, TOYA                        MRN:          [id number]  DATE:09/10/2008                            DOB:          April 11, 1928    PRIMARY CARE PHYSICIAN:  Lonzo Cloud. [name], MD   INTERVAL HISTORY:  Nealie is a delightful 77 year old woman with a history  of hyperlipidemia, gastric AVMs, gastroesophageal reflux disease, and  chronic atrial fibrillation with a CHADS2 score of 1.  She returns today  for routine followup.   Overall, Aparna is doing quite well.  She is active without any chest pain  or dyspnea.  She denies fatigue.  She has not had any palpitations.  She  says when she takes a blood pressure at home, her heart rates are  typically in the 60s and 70s.  We did add a little Toprol at the last  visit as her heart rate was in the mid 90s.  She denies any orthopnea or  PND.  She does note that she had a difficult night last night with a  very stressful situation, but she would not elaborate.   She has not been on Coumadin due to bleeding from gastric AVMs.  We  previously suggested that she increase her aspirin dose to 325, but Dr.  [name] would like to limit to 162 due to her high risk of bleeding.   CURRENT MEDICATIONS:  1. Prilosec 20 a day.  2. Actonel 35 a week.  3. Aspirin 162 a day.  4. Calcium.  5. Vytorin 10/20.  6. Diltiazem 180 a day.  7. Metoprolol 25 a day.   PHYSICAL EXAMINATION:  GENERAL:  She is well-appearing and in no acute  distress.  She ambulates around the clinic without any respiratory  difficulty.  VITAL SIGNS:  Blood pressure is 124/86, heart rate is 92, and weight is  156.  HEENT:  Normal.  NECK:  Supple.  No JVD.  Carotids are 2+ bilaterally without bruits.  There is no lymphadenopathy or thyromegaly.  CARDIAC:  PMI is nondisplaced.  She is irregularly irregular.  No  murmurs, rubs, or gallops.  LUNGS:  Clear.  ABDOMEN:  Soft, nontender,  and nondistended.  No hepatosplenomegaly or  bruits.  No masses.  Good bowel sounds.  EXTREMITIES:  Warm with no  cyanosis, clubbing, or edema.  NEUROLOGIC:  Alert and oriented x3.  Cranial nerves II through XII are  intact.  Moves all 4 extremities without difficulty.  Affect is  pleasant.   EKG shows atrial fibrillation at a rate of 92.  No ST-T wave  abnormalities.   ASSESSMENT AND PLAN:  Atrial fibrillation.  This is stable.  She is well  rate-controlled.  continue current therapy.  She is not a Coumadin  candidate.   DISPOSITION:  We will see her back in 1 year for routine followup.     Bevelyn Buckles. Bensimhon, MD  Electronically Signed    DRB/MedQ  DD: 09/10/2008  DT: 09/10/2008  Job #: [id number]

## 2011-01-05 NOTE — Assessment & Plan Note (Signed)
[facility]                            CARDIOLOGY OFFICE NOTE   MATTI, KILLINGSWORTH                        MRN:          [id number]  DATE:10/26/2007                            DOB:          April 09, 1928    PRIMARY CARE PHYSICIAN:  Lonzo Cloud. [name], MD   HISTORY:  Lisa Douglas is a delightful 77 year old woman with a history of  hyperlipidemia, gastric AVMs, gastroesophageal reflux disease and  chronic atrial fibrillation.  She returns today for routine follow-up.   Overall she says she is doing great.  She is very active.  She denies  any chest pain, shortness of breath, palpitations.  She denied any  syncope or presyncope.  At the last visit we tried to increase her  diltiazem to 240 mg a day but she was unable to tolerate this due to  lower extremity edema.  She has cut back and now her edema is much  better.   CURRENT MEDICATIONS:  1. Prilosec 20 mg a day.  2. Actonel 35 mg a week.  3. Aspirin 162 mg a day.  4. Calcium.  5. Vytorin 10/20 mg a day.  6. Diltiazem 180 mg a day.   PHYSICAL EXAM:  She is well-appearing, in no acute distress, and  ambulates around the clinic without any respiratory difficulty.  Blood pressure is 120/71, heart rate is 84, weight is 154.  HEENT:  Normal.  NECK:  Supple.  No JVD.  Carotids are 2+ bilaterally without any bruits.  There is no lymphadenopathy or thyromegaly.  CARDIAC:  PMI is nondisplaced.  She is a irregularly irregular with no  murmurs, rubs or gallops.  LUNGS:  Clear.  ABDOMEN:  Soft, nontender, nondistended.  No hepatosplenomegaly, no  bruits, no masses.  Good bowel sounds.  EXTREMITIES:  Warm with no cyanosis, clubbing or edema.  No rash.  NEUROLOGIC:  Alert and x3.  Cranial nerves II-XII are intact.  Moves all  four extremities without difficulty.  Affect is pleasant.   EKG shows atrial fibrillation at 84 beats per minute.  No significant ST-  T wave abnormalities.  There is low voltage.   ASSESSMENT/PLAN:  Chronic atrial fibrillation.  She is doing well.  Her  is well-controlled.  Unfortunately, she is unable to tolerate Coumadin  due to GI bleeding.  She is on aspirin.  If possible, would increase to  325 mg a day as she can tolerate.   DISPOSITION:  We will see her back in 6 months' time for routine follow-  up.     Bevelyn Buckles. Bensimhon, MD  Electronically Signed    DRB/MedQ  DD: 10/26/2007  DT: 10/27/2007  Job #: [id number]

## 2011-02-15 ENCOUNTER — Other Ambulatory Visit: Payer: Self-pay | Admitting: Pulmonary Disease

## 2011-04-01 ENCOUNTER — Encounter: Payer: Self-pay | Admitting: Pulmonary Disease

## 2011-04-01 ENCOUNTER — Other Ambulatory Visit (INDEPENDENT_AMBULATORY_CARE_PROVIDER_SITE_OTHER): Payer: Medicare Other

## 2011-04-01 ENCOUNTER — Ambulatory Visit (INDEPENDENT_AMBULATORY_CARE_PROVIDER_SITE_OTHER): Payer: Medicare Other | Admitting: Pulmonary Disease

## 2011-04-01 DIAGNOSIS — E559 Vitamin D deficiency, unspecified: Secondary | ICD-10-CM | POA: Insufficient documentation

## 2011-04-01 DIAGNOSIS — I4891 Unspecified atrial fibrillation: Secondary | ICD-10-CM

## 2011-04-01 DIAGNOSIS — E785 Hyperlipidemia, unspecified: Secondary | ICD-10-CM

## 2011-04-01 DIAGNOSIS — K219 Gastro-esophageal reflux disease without esophagitis: Secondary | ICD-10-CM

## 2011-04-01 DIAGNOSIS — K5730 Diverticulosis of large intestine without perforation or abscess without bleeding: Secondary | ICD-10-CM

## 2011-04-01 DIAGNOSIS — F411 Generalized anxiety disorder: Secondary | ICD-10-CM

## 2011-04-01 DIAGNOSIS — K5520 Angiodysplasia of colon without hemorrhage: Secondary | ICD-10-CM

## 2011-04-01 DIAGNOSIS — I872 Venous insufficiency (chronic) (peripheral): Secondary | ICD-10-CM

## 2011-04-01 DIAGNOSIS — M1990 Unspecified osteoarthritis, unspecified site: Secondary | ICD-10-CM

## 2011-04-01 DIAGNOSIS — M810 Age-related osteoporosis without current pathological fracture: Secondary | ICD-10-CM

## 2011-04-01 LAB — VITAMIN D 25 HYDROXY (VIT D DEFICIENCY, FRACTURES): Vit D, 25-Hydroxy: 39 ng/mL (ref 30–89)

## 2011-04-01 LAB — CBC WITH DIFFERENTIAL/PLATELET
Basophils Relative: 0.7 % (ref 0.0–3.0)
Eosinophils Relative: 1.4 % (ref 0.0–5.0)
Hemoglobin: 15 g/dL (ref 12.0–15.0)
Lymphocytes Relative: 24.4 % (ref 12.0–46.0)
MCV: 93.5 fl (ref 78.0–100.0)
Neutro Abs: 3.7 10*3/uL (ref 1.4–7.7)
Neutrophils Relative %: 64.8 % (ref 43.0–77.0)
RBC: 4.76 Mil/uL (ref 3.87–5.11)
WBC: 5.7 10*3/uL (ref 4.5–10.5)

## 2011-04-01 LAB — HEPATIC FUNCTION PANEL
ALT: 22 U/L (ref 0–35)
Total Bilirubin: 0.9 mg/dL (ref 0.3–1.2)
Total Protein: 7.2 g/dL (ref 6.0–8.3)

## 2011-04-01 LAB — BASIC METABOLIC PANEL
BUN: 16 mg/dL (ref 6–23)
Calcium: 9.1 mg/dL (ref 8.4–10.5)
Chloride: 102 mEq/L (ref 96–112)
Creatinine, Ser: 0.8 mg/dL (ref 0.4–1.2)

## 2011-04-01 LAB — LIPID PANEL
Cholesterol: 142 mg/dL (ref 0–200)
HDL: 62.8 mg/dL (ref >39.00)
LDL Cholesterol: 58 mg/dL (ref 0–99)
Total CHOL/HDL Ratio: 2
Triglycerides: 106 mg/dL (ref 0.0–149.0)

## 2011-04-01 NOTE — Progress Notes (Signed)
Subjective:    Patient ID: Lisa Douglas, female    DOB: [birth year], 77 y.o.   MRN: [id number]  HPI 77 y/o WF here for a follow up visit... he has multiple medical problems as noted below...   ~  WUJ81:  her stress level is reduced since Lisa Douglas passed & her caregiver role has diminished... generally stable w/ no new complaints or concerns... she saw Lisa Douglas 1/11- chr AFib doing well, no changes made, f/u 16yr... she stopped her Bisphos Rx (Actonel) in 2010 & refused to restart "I do Yogurt"- offered the new ATELVIA (easier to take, coated for less GI side effects) & she will consider this. ~  Aug11:  she's had a good 62mo- no new complaints or concerns... she tripped & fell down 3 steps at home- no injury but great concern because of her Lisa Douglas- last BMD Lisa Douglas 2008 & she needs f/u BMD but declines "I'll think about it" and still refuses meds- pills, shots, IV, etc... otherw stable- denies CP, palpit, SOB, etc;  BP controlled;  Lipids have been good on the simva20;  Alpraz refilled today & fasting labs done...  ~  September 30, 2010:  she's had another good 62mo- no new complaints or concerns... she saw Lisa Douglas 1/12 for f/u chr AFib, unable to take Coumadin due to AVMs, on daily ASA/ Metoprolol/ Cardizem, doing well- no changes made...  she had BMD done by GYN, Lisa Douglas- TScore -2.5 spine & -2.4 left FemNeck (she has refused meds & he didn't change management)...     Today she requests CXR "I wheeze" although exam is clear at present (CXR showed min basilar scarring, otherw neg);  still followed at [facility] clinic for macular degen but she states vision is fine;  Lipids stable on Simva20;  GI followed by Lisa Douglas & OK;  mod DJD but active at home she says...  ~  April 01, 2011:  62mo ROV & she continues stable w/o new complaints or concerns...    She remains in AFib w/ rate control strategy on ASA, Metoprolol, Diltiazem;  BP= 120/72 & pulse= 74;  She denies CP, palpit, dizzy, edema, etc; she notes  DOE w/o change from baseline; she knows to elim sodium, elev legs, wear support hose if needed...    Lipids controlled on Simva20 + diet & FLP today looks good; chemistries are similarly wnl...    GI is stable on Prilosec, stool softeners, etc...   Current Problem List:  MACULAR DEGENERATION (ICD-362.50) - she's followed in the [facility] clinic Q65mo; on Eye Vits and she states she may need shots in her left eye per Lisa Douglas; she also reports cataracts which may require surg...  ATRIAL FIBRILLATION, CHRONIC (ICD-427.31) - on ASA 81mg /d; rate control w/ METOPROLOL ER 25mg /d & DILTIAZEM 180mg /d; she can't take Coumadin due to colonic AVM's... ~  Cards f/u Lisa Douglas 1/12- doing well, no changes, f/u planned 48yr.  VENOUS INSUFFICIENCY (ICD-459.81) - she follows a low sodium diet... not on diuretic therapy.  HYPERLIPIDEMIA (ICD-272.4) - on SIMVASTATIN 20mg /d... ~  FLP was 6/08 showing TChol 135, TG 90, HDL 56, LDL 61... ~  FLP 2/09 showed TChol 140, TG 99, HDL 59, LDL 62... great! ~  FLP 2/10 on Vytor10/20 showed TChol 133, TG 83, HDL 56, LDL 60... rec> change to Simva20. ~  FLP 8/10 on Simva20 showed TChol 143, TG 99, HDL 51, LDL 73 ~  FLP 8/11 on Simva20 showed TChol 147, TG 103, HDL 54, LDL 72 ~  FLP 8/12 on Simva20 showed TChol 142, TG 106, HDL 63, LDL 58  GERD (ICD-530.81) - she takes PRILOSEC 20mg /d and remains asymptomatic...  DIVERTICULOSIS OF COLON (ICD-562.10) - no abd pain or episodes of diverticulitis; she uses stool softeners for constipation... ANGIODYSPLASIA, COLON (ZOX-096.04) - last colonoscopy was 10/05 by Lisa Douglas showing divertics and colon AVMs...  DEGENERATIVE JOINT DISEASE (ICD-715.90) - only c/o some hand pain... uses OTC meds as needed... her main exercise is "up and down the steps 40-11 times a day"...  OSTEOPOROSIS (ICD-733.00) - prev took Actonel but stopped on her own 2010...  takes Ca++, MVI, VitD OTC... her last BMD was 3/08 by Lisa Douglas for GYN and he follows  this (she gets BMD and Mammograms at his office)... she states INTOL to Alendronate "it made me sick" & refuses to restart meds for her bones (either pills, shots, or IV Rx)... ~  labs 2/10 showed Vit D level = 29... rec> add Vit D 1000 u OTC daily. ~  labs 8/10 showed Vit D level = 31... rec> continue 1000 u daily. ~  BMD 10/11 by Lisa Douglas showed TScores -2.5 in Spine, and -2.4 in left FemNeck...  ANXIETY (ICD-300.00) - she has ALPRAZOLAM 0.5mg  - 1/2 to 1 tab Tid Prn...   Past Surgical History  Procedure Date  . Appendectomy 1951  . Tonsillectomy 1943    Outpatient Encounter Prescriptions as of 04/01/2011  Medication Sig Dispense Refill  . ALPRAZolam (XANAX) 0.5 MG tablet Take 1/2 to 1 tab by mouth three times a day as needed.      Marland Kitchen aspirin 81 MG tablet Take 2 tablets  By mouth once daily      . Calcium Carbonate-Vitamin D (CALTRATE 600+D) 600-400 MG-UNIT per tablet Take 1 tablet by mouth 2 (two) times daily.        . cholecalciferol (VITAMIN D) 1000 UNITS tablet Take 1,000 Units by mouth daily.        Marland Kitchen diltiazem (CARDIZEM CD) 180 MG 24 hr capsule Take 180 mg by mouth daily.        . metoprolol succinate (TOPROL-XL) 25 MG 24 hr tablet Take 25 mg by mouth daily.        . Multiple Vitamins-Minerals (CENTRUM SILVER PO) Take 1 tablet by mouth daily.        . Multiple Vitamins-Minerals (EYE VITAMINS) TABS Take 1 tablet by mouth daily.        Marland Kitchen omeprazole (PRILOSEC OTC) 20 MG tablet Take 20 mg by mouth daily.        . simvastatin (ZOCOR) 20 MG tablet TAKE ONE TABLET BY MOUTH AT BEDTIME  30 tablet  5    Allergies  Allergen Reactions  . Alendronate Sodium     REACTION: INTOL to Fosamax per pt.  . Atorvastatin     REACTION: INTOL to Lipitor per pt.  . Penicillins     Current Medications, Allergies, Past Medical History, Past Surgical History, Family History, and Social History were reviewed in [employer] record.    Review of Systems    See HPI - all other  systems neg except as noted...  The patient complains of dyspnea on exertion.  The patient denies anorexia, fever, weight loss, weight gain, vision loss, decreased hearing, hoarseness, chest pain, syncope, peripheral edema, prolonged cough, headaches, hemoptysis, abdominal pain, melena, hematochezia, severe indigestion/heartburn, hematuria, incontinence, muscle weakness, suspicious skin lesions, transient blindness, difficulty walking, depression, unusual weight change, abnormal bleeding, enlarged lymph nodes, and angioedema.  Objective:   Physical Exam     WD, WN, 77 y/o WF in NAD... GENERAL:  Alert & oriented; pleasant & cooperative... HEENT:  [place]/AT, EOM-wnl, PERRLA, EACs-clear, TMs-wnl, NOSE-clear, THROAT-clear & wnl. NECK:  Supple w/ fairROM; no JVD; normal carotid impulses w/o bruits; no thyromegaly or nodules palpated; no lymphadenopathy. CHEST:  Clear to P & A; without wheezes/ rales/ or rhonchi... HEART:  irreg w/ controlled response, without murmurs/ rubs/ or gallops heard... ABDOMEN:  Soft & nontender; normal bowel sounds; no organomegaly or masses detected. EXT: without deformities, mild arthritic changes; no varicose veins/ +venous insuffic/ no edema. NEURO:  CN's intact; no focal neuro deficits... DERM:  No lesions noted; no rash etc...   Assessment & Plan:   MACULAR DEGEN, Cataracts>  Followed by [facility] in [place] clinic...  Atrial Fibrillation>  On ASA, Metoprolol, Diltiazem; rate control strategy & no coumadin due to AVMs; stable w/ HR=74, asymptomatic...  Ven Insuffic>  Doing satis w/o signif edema...  Hyperlipid> stable on Simva20 w/ excellent FLP...  GI> GERD, Divertics, AVMs>  Followed by Lisa Douglas & stable on Prilosec & stool softeners...  DJD>  She uses OTC analgesics prn...  Osteoporosis>  BMDs followed by her GYN Lisa Douglas; she's refused Bisphos Rx- on calcium, MVI, Vit D...  Anxiety>  On alprazolam 0.5mg  prn.Marland KitchenMarland Kitchen

## 2011-04-01 NOTE — Patient Instructions (Signed)
Today we updated your med list in EPIC...    Continue your current meds the same...  Today we did your follow up fasting blood work...    Please call the PHONE TREE in a few days for your results...    Dial [id number] & when prompted enter your patient number followed by the # symbol...    Your patient number is:   [id number]#  Call for any questions...  Stay as active as poss...  Let's plan another check up in 6 months, sooner if needed for problems.Marland KitchenMarland Kitchen

## 2011-04-11 ENCOUNTER — Encounter: Payer: Self-pay | Admitting: Pulmonary Disease

## 2011-05-26 ENCOUNTER — Ambulatory Visit (INDEPENDENT_AMBULATORY_CARE_PROVIDER_SITE_OTHER): Payer: Medicare Other

## 2011-05-26 DIAGNOSIS — Z23 Encounter for immunization: Secondary | ICD-10-CM

## 2011-08-18 ENCOUNTER — Other Ambulatory Visit: Payer: Self-pay | Admitting: Pulmonary Disease

## 2011-08-31 DIAGNOSIS — H4011X0 Primary open-angle glaucoma, stage unspecified: Secondary | ICD-10-CM | POA: Diagnosis not present

## 2011-08-31 DIAGNOSIS — H35349 Macular cyst, hole, or pseudohole, unspecified eye: Secondary | ICD-10-CM | POA: Diagnosis not present

## 2011-08-31 DIAGNOSIS — H409 Unspecified glaucoma: Secondary | ICD-10-CM | POA: Diagnosis not present

## 2011-09-01 ENCOUNTER — Other Ambulatory Visit: Payer: Self-pay | Admitting: Internal Medicine

## 2011-10-05 ENCOUNTER — Encounter: Payer: Self-pay | Admitting: Pulmonary Disease

## 2011-10-05 ENCOUNTER — Telehealth: Payer: Self-pay | Admitting: Pulmonary Disease

## 2011-10-05 ENCOUNTER — Ambulatory Visit (INDEPENDENT_AMBULATORY_CARE_PROVIDER_SITE_OTHER): Payer: Medicare Other | Admitting: Pulmonary Disease

## 2011-10-05 DIAGNOSIS — E785 Hyperlipidemia, unspecified: Secondary | ICD-10-CM | POA: Diagnosis not present

## 2011-10-05 DIAGNOSIS — K219 Gastro-esophageal reflux disease without esophagitis: Secondary | ICD-10-CM

## 2011-10-05 DIAGNOSIS — K5520 Angiodysplasia of colon without hemorrhage: Secondary | ICD-10-CM

## 2011-10-05 DIAGNOSIS — M810 Age-related osteoporosis without current pathological fracture: Secondary | ICD-10-CM

## 2011-10-05 DIAGNOSIS — I4891 Unspecified atrial fibrillation: Secondary | ICD-10-CM | POA: Diagnosis not present

## 2011-10-05 DIAGNOSIS — K5730 Diverticulosis of large intestine without perforation or abscess without bleeding: Secondary | ICD-10-CM

## 2011-10-05 DIAGNOSIS — I872 Venous insufficiency (chronic) (peripheral): Secondary | ICD-10-CM | POA: Diagnosis not present

## 2011-10-05 DIAGNOSIS — M1990 Unspecified osteoarthritis, unspecified site: Secondary | ICD-10-CM

## 2011-10-05 DIAGNOSIS — F411 Generalized anxiety disorder: Secondary | ICD-10-CM

## 2011-10-05 MED ORDER — ALPRAZOLAM 0.5 MG PO TABS: ORAL_TABLET | ORAL | Status: DC

## 2011-10-05 NOTE — Progress Notes (Signed)
Subjective:    Patient ID: Lisa Douglas, female    DOB: 03-May-1928, 78 y.o.   MRN: [id number]  HPI 78 y/o WF here for a follow up visit... he has multiple medical problems as noted below...   ~  September 30, 2010:  she's had another good 90mo- no new complaints or concerns... she saw [name] 1/12 for f/u chr AFib, unable to take Coumadin due to AVMs, on daily ASA/ Metoprolol/ Cardizem, doing well- no changes made...  she had BMD done by GYN, [name]- TScore -2.5 spine & -2.4 left FemNeck (she has refused meds & he didn't change management)...     Today she requests CXR "I wheeze" although exam is clear at present (CXR showed min basilar scarring, otherw neg);  still followed at [facility] clinic for macular degen but she states vision is fine;  Lipids stable on Simva20;  GI followed by [name] & [place];  mod DJD but active at home she says...  ~  April 01, 2011:  90mo ROV & she continues stable w/o new complaints or concerns...    She remains in AFib w/ rate control strategy on ASA, Metoprolol, Diltiazem;  BP= 120/72 & pulse= 74;  She denies CP, palpit, dizzy, edema, etc; she notes DOE w/o change from baseline; she knows to elim sodium, elev legs, wear support hose if needed...    Lipids controlled on Simva20 + diet & FLP today looks good; chemistries are similarly wnl...    GI is stable on Prilosec, stool softeners, etc...  ~  October 05, 2011:  90mo ROV & she is stable w/ mult chronic complaints> notes gas, belching, occas nausea on & off x yrs; on Prilosec 20mg /d, "nothing bothering me now"; also loose stool x1 that was yellow, now gone & hasn't recurred; we discussed taking Align & Activia...  We reviewed her meds & full labs done 8/12>>    Cardiac stable w/ chr AFib on ASA, Cardizem & Metoprolol> BP= 126/74 & feels well w/o HA, visual symptoms, CP, palpit, ch in SOB, edema, etc; not on Coumadin due to [place] & GIB; she sees [name] once a yr & she's been stable...    We reviewed  her Osteoporosis, prev followed by [name] but she doesn't think she needs any further GYN checks; she has repeatedly refused any & all osteoporosis rx; again rec that she consider Alendronate or Atelvia orally or Reclast by infusion but she declines...    We reviewed meds & prev labs; she requests Rx for Shingles vaccine and refill Alprazolam...   PROBLEM LIST:    MACULAR DEGENERATION (ICD-362.50) - she's followed in the [facility] clinic Q90mo; on Eye Vits and she states she may need shots in her left eye per [name]; she also reports cataracts which may require surg...  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 ~  Cards f/u [name] 1/12- doing well, no changes, f/u planned 97yr.  VENOUS INSUFFICIENCY (ICD-459.81) - she follows a low sodium diet... not on diuretic therapy.  HYPERLIPIDEMIA (ICD-272.4) - on SIMVASTATIN 20mg /d... ~  FLP was 6/08 showing TChol 135, TG 90, HDL 56, LDL 61... ~  FLP 2/09 showed TChol 140, TG 99, HDL 59, LDL 62... great! ~  FLP 2/10 on Vytor10/20 showed TChol 133, TG 83, HDL 56, LDL 60... rec> change to Simva20. ~  FLP 8/10 on Simva20 showed TChol 143, TG 99, HDL 51, LDL 73 ~  FLP 8/11 on Simva20 showed TChol 147, TG 103, HDL 54, LDL 72 ~  FLP 8/12 on Simva20 showed TChol 142, TG 106, HDL 63, LDL 58  GERD (ICD-530.81) - she takes PRILOSEC 20mg /d and remains asymptomatic...  DIVERTICULOSIS OF COLON (ICD-562.10) - no abd pain or episodes of diverticulitis; she uses stool softeners for constipation... ANGIODYSPLASIA, COLON (KWI-097.35) - last colonoscopy was 10/05 by [name] showing divertics and colon AVMs...  DEGENERATIVE JOINT DISEASE (ICD-715.90) - only c/o some hand pain... uses OTC meds as needed... her main exercise is "up and down the steps 40-11 times a  day"...  OSTEOPOROSIS (ICD-733.00) - prev took Actonel but stopped on her own 01-23-2009...  takes Ca++, MVI, VitD OTC... her last BMD was 3/08 by [name] for GYN and he follows this (she gets BMD and Mammograms at his office)... she states INTOL to Alendronate "it made me sick" & refuses to restart meds for her bones (either pills, shots, or IV Rx)... ~  labs 2/10 showed Vit D level = 29... rec> add Vit D 1000 u OTC daily. ~  labs 8/10 showed Vit D level = 31... rec> continue 1000 u daily. ~  BMD 10/11 by [name] showed TScores -2.5 in Spine, and -2.4 in left FemNeck... ~  2/12:  Reminded of the importance of bisphos or other therapy for osteoporosis but she continues to decline treatment... ~  2/13:  Asked to reconsider treatment for her osteoporosis but she declines; asked her to discuss w/ her GYN (they do her BMDs)...  ANXIETY (ICD-300.00) - she has ALPRAZOLAM 0.5mg  - 1/2 to 1 tab Tid Prn... ~  Stress level decr since ruth passed away 01/23/2009 & caregiver roll is diminished...   Past Surgical History  Procedure Date  . Appendectomy 1951  . Tonsillectomy 1943    Outpatient Encounter Prescriptions as of 10/05/2011  Medication Sig Dispense Refill  . ALPRAZolam (XANAX) 0.5 MG tablet Take 1/2 to 1 tab by mouth three times a day as needed.      Marland Kitchen aspirin 81 MG tablet Take 2 tablets  By mouth once daily      . Calcium Carbonate-Vitamin D (CALTRATE 600+D) 600-400 MG-UNIT per tablet Take 1 tablet by mouth 2 (two) times daily.        . cholecalciferol (VITAMIN D) 1000 UNITS tablet Take 2,000 Units by mouth daily.       Marland Kitchen diltiazem (CARDIZEM CD) 180 MG 24 hr capsule Take 180 mg by mouth daily.        . metoprolol succinate (TOPROL-XL) 25 MG 24 hr tablet TAKE ONE TABLET BY MOUTH EVERY DAY  30 tablet  3  . Multiple Vitamins-Minerals (CENTRUM SILVER PO) Take 1 tablet by mouth daily.        . Multiple Vitamins-Minerals (EYE VITAMINS) TABS Take 1 tablet by mouth daily.        Marland Kitchen omeprazole (PRILOSEC OTC) 20 MG  tablet Take 20 mg by mouth daily.        . simvastatin (ZOCOR) 20 MG tablet TAKE ONE TABLET BY MOUTH AT BEDTIME  30 tablet  5    Allergies  Allergen Reactions  . Penicillins     angioedema  . Alendronate Sodium     REACTION: INTOL to Fosamax per pt.  . Atorvastatin     REACTION: INTOL to Lipitor per pt.    Current Medications, Allergies, Past Medical History, Past Surgical History, Family  History, and Social History were reviewed in [employer] record.    Review of Systems    See HPI - all other systems neg except as noted...  The patient complains of dyspnea on exertion.  The patient denies anorexia, fever, weight loss, weight gain, vision loss, decreased hearing, hoarseness, chest pain, syncope, peripheral edema, prolonged cough, headaches, hemoptysis, abdominal pain, melena, hematochezia, severe indigestion/heartburn, hematuria, incontinence, muscle weakness, suspicious skin lesions, transient blindness, difficulty walking, depression, unusual weight change, abnormal bleeding, enlarged lymph nodes, and angioedema.    Objective:   Physical Exam     WD, WN, 78 y/o WF in NAD... GENERAL:  Alert & oriented; pleasant & cooperative... HEENT:  [place]/AT, EOM-wnl, PERRLA, EACs-clear, TMs-wnl, NOSE-clear, THROAT-clear & wnl. NECK:  Supple w/ fairROM; no JVD; normal carotid impulses w/o bruits; no thyromegaly or nodules palpated; no lymphadenopathy. CHEST:  Clear to P & A; without wheezes/ rales/ or rhonchi... HEART:  irreg w/ controlled response, without murmurs/ rubs/ or gallops heard... ABDOMEN:  Soft & nontender; normal bowel sounds; no organomegaly or masses detected. EXT: without deformities, mild arthritic changes; no varicose veins/ +venous insuffic/ no edema. NEURO:  CN's intact; no focal neuro deficits... DERM:  No lesions noted; no rash etc...  RADIOLOGY DATA:  Reviewed in the EPIC EMR & discussed w/ the patient...    >>Last CXR 2/12 showed biapical pleural  scarring, otherw clear, NAD...  LABORATORY DATA:  Reviewed in the EPIC EMR & discussed w/ the patient...    >LABS 8/12 showed:  FLP- at goal on Simva20;  Chems- wnl;  CBC- wnl;  TSH=1.56;  VitD=39 & rec to incr to 2000u/d.   Assessment & Plan:   MACULAR DEGEN, Cataracts>  Followed by [facility] in [place] clinic...  Atrial Fibrillation>  On ASA, Metoprolol, Diltiazem; rate control strategy & no coumadin due to AVMs; stable w/ HR=74, asymptomatic...  Ven Insuffic>  Doing satis w/o signif edema...  Hyperlipid> stable on Simva20 w/ excellent FLP...  GI> GERD, Divertics, AVMs>  Followed by [name] & stable on Prilosec & stool softeners...  DJD>  She uses OTC analgesics prn...  Osteoporosis>  BMDs followed by her GYN [name]; she's refused Bisphos Rx- on calcium, MVI, Vit D...  Anxiety>  On Alprazolam 0.5mg  prn...   Patient's Medications  New Prescriptions   No medications on file  Previous Medications   ASPIRIN 81 MG TABLET    Take 2 tablets  By mouth once daily   CALCIUM CARBONATE-VITAMIN D (CALTRATE 600+D) 600-400 MG-UNIT PER TABLET    Take 1 tablet by mouth 2 (two) times daily.     CHOLECALCIFEROL (VITAMIN D) 1000 UNITS TABLET    Take 2,000 Units by mouth daily.    METOPROLOL SUCCINATE (TOPROL-XL) 25 MG 24 HR TABLET    TAKE ONE TABLET BY MOUTH EVERY DAY   MULTIPLE VITAMINS-MINERALS (CENTRUM SILVER PO)    Take 1 tablet by mouth daily.     MULTIPLE VITAMINS-MINERALS (EYE VITAMINS) TABS    Take 1 tablet by mouth daily.     OMEPRAZOLE (PRILOSEC OTC) 20 MG TABLET    Take 20 mg by mouth daily.     SIMVASTATIN (ZOCOR) 20 MG TABLET    TAKE ONE TABLET BY MOUTH AT BEDTIME  Modified Medications   Modified Medication Previous Medication   ALPRAZOLAM (XANAX) 0.5 MG TABLET ALPRAZolam (XANAX) 0.5 MG tablet      Take 1/2 to 1 tab by mouth three times a day as needed.    Take  1/2 to 1 tab by mouth three times a day as needed.   DILTIAZEM (CARDIZEM CD) 180 MG 24 HR CAPSULE diltiazem  (CARDIZEM CD) 180 MG 24 hr capsule      TAKE ONE CAPSULE BY MOUTH EVERY DAY    Take 180 mg by mouth daily.    Discontinued Medications   No medications on file

## 2011-10-05 NOTE — Patient Instructions (Signed)
Today we updated your med list in our EPIC system...    Continue your current medications the same...  We refilled your Alprazolam per request...  Let me know if you change your mind about Osteoporosis medication for your bones...  Remember to use the ALIGN one daily & the ACTIVIA Yogurt for your colon health...  Call for any questions...  Let's plan a follow up visit in 6 months w/ FASTING blood work at that time.Marland KitchenMarland Kitchen

## 2011-10-05 NOTE — Telephone Encounter (Signed)
Leigh, please advise thanks! 

## 2011-10-07 NOTE — Telephone Encounter (Signed)
This has been taken care of.

## 2011-10-11 ENCOUNTER — Other Ambulatory Visit (HOSPITAL_COMMUNITY): Payer: Self-pay | Admitting: Internal Medicine

## 2011-10-25 ENCOUNTER — Encounter: Payer: Self-pay | Admitting: Pulmonary Disease

## 2011-11-03 ENCOUNTER — Encounter: Payer: Self-pay | Admitting: Pulmonary Disease

## 2011-11-03 ENCOUNTER — Telehealth: Payer: Self-pay | Admitting: Pulmonary Disease

## 2011-11-03 ENCOUNTER — Ambulatory Visit (INDEPENDENT_AMBULATORY_CARE_PROVIDER_SITE_OTHER): Payer: Medicare Other | Admitting: Pulmonary Disease

## 2011-11-03 VITALS — BP 136/72 | HR 99 | Temp 99.4°F | Ht 66.0 in | Wt 150.4 lb

## 2011-11-03 DIAGNOSIS — J069 Acute upper respiratory infection, unspecified: Secondary | ICD-10-CM

## 2011-11-03 DIAGNOSIS — M1990 Unspecified osteoarthritis, unspecified site: Secondary | ICD-10-CM

## 2011-11-03 DIAGNOSIS — F411 Generalized anxiety disorder: Secondary | ICD-10-CM

## 2011-11-03 DIAGNOSIS — K5520 Angiodysplasia of colon without hemorrhage: Secondary | ICD-10-CM | POA: Diagnosis not present

## 2011-11-03 DIAGNOSIS — M810 Age-related osteoporosis without current pathological fracture: Secondary | ICD-10-CM

## 2011-11-03 DIAGNOSIS — I4891 Unspecified atrial fibrillation: Secondary | ICD-10-CM

## 2011-11-03 DIAGNOSIS — K219 Gastro-esophageal reflux disease without esophagitis: Secondary | ICD-10-CM

## 2011-11-03 MED ORDER — HYDROCOD POLST-CHLORPHEN POLST 10-8 MG/5ML PO LQCR: 5.0000 mL | Freq: Two times a day (BID) | ORAL | Status: DC

## 2011-11-03 MED ORDER — AZITHROMYCIN 250 MG PO TABS: ORAL_TABLET | ORAL | Status: AC

## 2011-11-03 MED ORDER — PREDNISONE 10 MG PO TABS: ORAL_TABLET | ORAL | Status: DC

## 2011-11-03 NOTE — Patient Instructions (Signed)
Today we updated your med list in our EPIC system...    Continue your current medications the same...  For your upper resp infecton:    Rest at home & drink lots of fluids & chicken soup!    Take OTC MUCINEX-DM  2 tabs twice daily...    Take OTC TYLENOL every 4-6H as needed for the fever, aches & pains...  We have prescribed a ZPak, & gave you a Depo shot & a tapering course of oral Prednisone for the inflammation...  Finally we wrote a prescription for TUSSIONEX to use as needed for the cough- 1/2 to 1 tsp every 12 H as needed...  Call for any questions.Marland KitchenMarland Kitchen

## 2011-11-03 NOTE — Progress Notes (Signed)
Subjective:    Patient ID: Lisa Douglas, female    DOB: [birth year], 78 y.o.   MRN: [id number]  HPI 78 y/o WF here for a follow up visit... he has multiple medical problems as noted below...   ~  September 30, 2010:  she's had another good 54mo- no new complaints or concerns... she saw [name] 1/12 for f/u chr AFib, unable to take Coumadin due to AVMs, on daily ASA/ Metoprolol/ Cardizem, doing well- no changes made...  she had BMD done by GYN, [name]- TScore -2.5 spine & -2.4 left FemNeck (she has refused meds & he didn't change management)...     Today she requests CXR "I wheeze" although exam is clear at present (CXR showed min basilar scarring, otherw neg);  still followed at [facility] for macular degen but she states vision is fine;  Lipids stable on Simva20;  GI followed by [name] & [place];  mod DJD but active at home she says...  ~  April 01, 2011:  54mo ROV & she continues stable w/o new complaints or concerns...    She remains in AFib w/ rate control strategy on ASA, Metoprolol, Diltiazem;  BP= 120/72 & pulse= 74;  She denies CP, palpit, dizzy, edema, etc; she notes DOE w/o change from baseline; she knows to elim sodium, elev legs, wear support hose if needed...    Lipids controlled on Simva20 + diet & FLP today looks good; chemistries are similarly wnl...    GI is stable on Prilosec, stool softeners, etc...  ~  October 05, 2011:  54mo ROV & she is stable w/ mult chronic complaints> notes gas, belching, occas nausea on & off x yrs; on Prilosec 20mg /d, "nothing bothering me now"; also loose stool x1 that was yellow, now gone & hasn't recurred; we discussed taking Align & Activia...  We reviewed her meds & full labs done 8/12>>    Cardiac stable w/ chr AFib on ASA, Cardizem & Metoprolol> BP= 126/74 & feels well w/o HA, visual symptoms, CP, palpit, ch in SOB, edema, etc; not on Coumadin due to [place] & GIB; she sees [name] once a yr & she's been stable...    We reviewed  her Osteoporosis, prev followed by [name] but she doesn't think she needs any further GYN checks; she has repeatedly refused any & all osteoporosis rx; again rec that she consider Alendronate or Atelvia orally or Reclast by infusion but she declines...    We reviewed meds & prev labs; she requests Rx for Shingles vaccine and refill Alprazolam...  ~  March 13,2013:  76mo ROV & add-on for Acute URI symptoms> c/o 4-5d hx "cold" w/ sneezing, head congestion, sinus draining, then cough, low grade temp, clear phlegm, sore from the coughing & can't rest; she has tried OTC Tylenol & nasal saline but no relief; exam shows T99.4, red inflammed pharynx, nasal congestion & drainage, chest rhonchi & mild exp wheezing; we discussed treatment w/ Rest, Tylenol, MucinexDM, Fluids, and ZPak/ Depo/ Pred taper/ & Tussionex...    Other problems including her AFib, Chol, Gerd, DJD, etc are essentially stable- continue same meds...   PROBLEM LIST:    MACULAR DEGENERATION (ICD-362.50) - she's followed in the [facility] Q74mo; on Eye Vits and she states she may need shots in her left eye per [name]; she also reports cataracts which may require surg...  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 ~  Cards f/u [name] 1/12- doing well, no changes, f/u planned 78yr. ~  EKG 3/13 showed Fib/flutter w/ variable conduction & NSSTTWA...  VENOUS INSUFFICIENCY (ICD-459.81) - she follows a low sodium diet... not on diuretic therapy.  HYPERLIPIDEMIA (ICD-272.4) - on SIMVASTATIN 20mg /d... ~  FLP was 6/08 showing TChol 135, TG 90, HDL 56, LDL 61... ~  FLP 2/09 showed TChol 140, TG 99, HDL 59, LDL 62... great! ~  FLP 2/10 on Vytor10/20 showed TChol 133, TG 83, HDL 56, LDL 60... rec> change to Simva20. ~  FLP 8/10 on Simva20 showed TChol 143, TG  99, HDL 51, LDL 73 ~  FLP 8/11 on Simva20 showed TChol 147, TG 103, HDL 54, LDL 72 ~  FLP 8/12 on Simva20 showed TChol 142, TG 106, HDL 63, LDL 58  GERD (ICD-530.81) - she takes PRILOSEC 20mg /d and remains asymptomatic...  DIVERTICULOSIS OF COLON (ICD-562.10) - no abd pain or episodes of diverticulitis; she uses stool softeners for constipation... ANGIODYSPLASIA, COLON (ZOX-096.04) - last colonoscopy was 10/05 by [name] showing divertics and colon AVMs...  DEGENERATIVE JOINT DISEASE (ICD-715.90) - only c/o some hand pain... uses OTC meds as needed... her main exercise is "up and down the steps 40-11 times a day"...  OSTEOPOROSIS (ICD-733.00) - prev took Actonel but stopped on her own 01-20-09...  takes Ca++, MVI, VitD OTC... her last BMD was 3/08 by [name] for GYN and he follows this (she gets BMD and Mammograms at his office)... she states INTOL to Alendronate "it made me sick" & refuses to restart meds for her bones (either pills, shots, or IV Rx)... ~  labs 2/10 showed Vit D level = 29... rec> add Vit D 1000 u OTC daily. ~  labs 8/10 showed Vit D level = 31... rec> continue 1000 u daily. ~  BMD 10/11 by [name] showed TScores -2.5 in Spine, and -2.4 in left FemNeck... ~  2/12:  Reminded of the importance of bisphos or other therapy for osteoporosis but she continues to decline treatment... ~  2/13:  Asked to reconsider treatment for her osteoporosis but she declines; asked her to discuss w/ her GYN (they do her BMDs)...  ANXIETY (ICD-300.00) - she has ALPRAZOLAM 0.5mg  - 1/2 to 1 tab Tid Prn... ~  Stress level decr since [name] passed away 20-Jan-2009 & caregiver roll is diminished...   Past Surgical History  Procedure Date  . Appendectomy 1951  . Tonsillectomy 1943    Outpatient Encounter Prescriptions as of 76/13/2013  Medication Sig Dispense Refill  . ALPRAZolam (XANAX) 0.5 MG tablet Take 1/2 to 1 tab by mouth three times a day as needed.  90 tablet  5  . aspirin 81 MG tablet Take 2 tablets   By mouth once daily      . Calcium Carbonate-Vitamin D (CALTRATE 600+D) 600-400 MG-UNIT per tablet Take 1 tablet by mouth 2 (two) times daily.        . cholecalciferol (VITAMIN D) 1000 UNITS tablet Take 2,000 Units by mouth daily.       Marland Kitchen diltiazem (CARDIZEM CD) 180 MG 24 hr capsule TAKE ONE CAPSULE BY MOUTH EVERY DAY  30 capsule  1  . metoprolol succinate (TOPROL-XL) 25 MG 24 hr tablet TAKE ONE TABLET BY MOUTH EVERY DAY  30 tablet  3  . Multiple Vitamins-Minerals (CENTRUM SILVER PO) Take 1 tablet by mouth daily.        Marland Kitchen  Multiple Vitamins-Minerals (EYE VITAMINS) TABS Take 1 tablet by mouth daily.        Marland Kitchen omeprazole (PRILOSEC OTC) 20 MG tablet Take 20 mg by mouth daily.        . simvastatin (ZOCOR) 20 MG tablet TAKE ONE TABLET BY MOUTH AT BEDTIME  30 tablet  5  . azithromycin (ZITHROMAX) 250 MG tablet Take 2 tablets (500 mg) on  Day 1,  followed by 1 tablet (250 mg) once daily on Days 2 through 5.  6 each  0  . chlorpheniramine-HYDROcodone (TUSSIONEX PENNKINETIC ER) 10-8 MG/5ML LQCR Take 5 mLs by mouth every 12 (twelve) hours.  140 mL  1  . predniSONE (DELTASONE) 10 MG tablet 1 tablet bid x 3 days, 1 tablet daily x 3 days, then 1/2 tablet daily until gone  12 tablet  0    Allergies  Allergen Reactions  . Penicillins     angioedema  . Alendronate Sodium     REACTION: INTOL to Fosamax per pt.  . Atorvastatin     REACTION: INTOL to Lipitor per pt.    Current Medications, Allergies, Past Medical History, Past Surgical History, Family History, and Social History were reviewed in [employer] record.    Review of Systems    See HPI - all other systems neg except as noted...  The patient complains of dyspnea on exertion.  The patient denies anorexia, fever, weight loss, weight gain, vision loss, decreased hearing, hoarseness, chest pain, syncope, peripheral edema, prolonged cough, headaches, hemoptysis, abdominal pain, melena, hematochezia, severe indigestion/heartburn,  hematuria, incontinence, muscle weakness, suspicious skin lesions, transient blindness, difficulty walking, depression, unusual weight change, abnormal bleeding, enlarged lymph nodes, and angioedema.    Objective:   Physical Exam     WD, WN, 78 y/o WF in NAD... GENERAL:  Alert & oriented; pleasant & cooperative... HEENT:  Overly/AT, EOM-wnl, PERRLA, EACs-clear, TMs-wnl, NOSE- red, congested, THROAT- red/ inflammed, mucoid drainage. NECK:  Supple w/ fairROM; no JVD; normal carotid impulses w/o bruits; no thyromegaly or nodules palpated; no lymphadenopathy. CHEST:  Rhonchi R>L w/ some exp wheezing; no rales or signs of consolidation... HEART:  irreg w/ more rapid response, without murmurs/ rubs/ or gallops heard... ABDOMEN:  Soft & nontender; normal bowel sounds; no organomegaly or masses detected. EXT: without deformities, mild arthritic changes; no varicose veins/ +venous insuffic/ no edema. NEURO:  CN's intact; no focal neuro deficits... DERM:  No lesions noted; no rash etc...  RADIOLOGY DATA:  Reviewed in the EPIC EMR & discussed w/ the patient...    >>Last CXR 2/12 showed biapical pleural scarring, otherw clear, NAD...    >>EKG 3/13 showed AFib/ Flutter, variable conduction w/ rate>100, NSSTTWA...  LABORATORY DATA:  Reviewed in the EPIC EMR & discussed w/ the patient...    >LABS 8/12 showed:  FLP- at goal on Simva20;  Chems- wnl;  CBC- wnl;  TSH=1.56;  VitD=39 & rec to incr to 2000u/d.   Assessment & Plan:   URI> mult sites: sinuses, post pharynx, bronchi>  We discussed treatment w/ rest, fluids, MucinexDM- 2Bid, Tylenol; plus ZPak, Depo80, Pred10mg - 3d taper, TUSSIONEX 1/2 to 1tsp as needed for the cough...  Atrial Fibrillation>  On ASA, Metoprolol, Diltiazem; rate control strategy & no coumadin due to AVMs; stable w/ HR=74, asymptomatic...   Ven Insuffic>  Doing satis w/o signif edema...  Hyperlipid> stable on Simva20 w/ excellent FLP...  GI> GERD, Divertics, AVMs>  Followed by  [name] & stable on Prilosec & stool softeners...  DJD>  She uses OTC analgesics prn...  Osteoporosis>  BMDs followed by her GYN [name]; she's refused Bisphos Rx- on calcium, MVI, Vit D...  Anxiety>  On Alprazolam 0.5mg  prn...  MACULAR DEGEN, Cataracts>  Followed by [facility] in [place] [facility]...   Patient's Medications  New Prescriptions   AZITHROMYCIN (ZITHROMAX) 250 MG TABLET    Take 2 tablets (500 mg) on  Day 1,  followed by 1 tablet (250 mg) once daily on Days 2 through 5.   CHLORPHENIRAMINE-HYDROCODONE (TUSSIONEX PENNKINETIC ER) 10-8 MG/5ML LQCR    Take 5 mLs by mouth every 12 (twelve) hours.   PREDNISONE (DELTASONE) 10 MG TABLET    1 tablet bid x 3 days, 1 tablet daily x 3 days, then 1/2 tablet daily until gone  Previous Medications   ALPRAZOLAM (XANAX) 0.5 MG TABLET    Take 1/2 to 1 tab by mouth three times a day as needed.   ASPIRIN 81 MG TABLET    Take 2 tablets  By mouth once daily   CALCIUM CARBONATE-VITAMIN D (CALTRATE 600+D) 600-400 MG-UNIT PER TABLET    Take 1 tablet by mouth 2 (two) times daily.     CHOLECALCIFEROL (VITAMIN D) 1000 UNITS TABLET    Take 2,000 Units by mouth daily.    DILTIAZEM (CARDIZEM CD) 180 MG 24 HR CAPSULE    TAKE ONE CAPSULE BY MOUTH EVERY DAY   METOPROLOL SUCCINATE (TOPROL-XL) 25 MG 24 HR TABLET    TAKE ONE TABLET BY MOUTH EVERY DAY   MULTIPLE VITAMINS-MINERALS (CENTRUM SILVER PO)    Take 1 tablet by mouth daily.     MULTIPLE VITAMINS-MINERALS (EYE VITAMINS) TABS    Take 1 tablet by mouth daily.     OMEPRAZOLE (PRILOSEC OTC) 20 MG TABLET    Take 20 mg by mouth daily.     SIMVASTATIN (ZOCOR) 20 MG TABLET    TAKE ONE TABLET BY MOUTH AT BEDTIME  Modified Medications   No medications on file  Discontinued Medications   No medications on file

## 2011-11-03 NOTE — Telephone Encounter (Signed)
Pt c/o cough and congestion for several days. She says she is not sleeping. Low grade fever at times. Weak. Pt scheduled to see SN today at 4pm.

## 2011-11-04 MED ORDER — METHYLPREDNISOLONE ACETATE 80 MG/ML IJ SUSP
80.0000 mg | Freq: Once | INTRAMUSCULAR | Status: AC
  Administered 2011-11-03: 80 mg via INTRAMUSCULAR

## 2011-11-04 NOTE — Progress Notes (Signed)
Addended by: Marcellus Scott on: 11/04/2011 09:15 AM   Modules accepted: Orders

## 2011-11-08 ENCOUNTER — Other Ambulatory Visit: Payer: Self-pay | Admitting: Pulmonary Disease

## 2011-11-08 DIAGNOSIS — Z1231 Encounter for screening mammogram for malignant neoplasm of breast: Secondary | ICD-10-CM

## 2011-11-16 ENCOUNTER — Ambulatory Visit (INDEPENDENT_AMBULATORY_CARE_PROVIDER_SITE_OTHER): Payer: Medicare Other | Admitting: Internal Medicine

## 2011-11-16 ENCOUNTER — Encounter: Payer: Self-pay | Admitting: Internal Medicine

## 2011-11-16 VITALS — BP 118/70 | HR 79 | Ht 66.0 in | Wt 150.8 lb

## 2011-11-16 DIAGNOSIS — I4891 Unspecified atrial fibrillation: Secondary | ICD-10-CM | POA: Diagnosis not present

## 2011-11-16 NOTE — Assessment & Plan Note (Signed)
Chronic. Well rate controlled. Not coumadin candidate due to AVMs. Continue ASA. She will f/u with Dr. [name]. Can see [place] back as needed.

## 2011-11-16 NOTE — Patient Instructions (Signed)
Your physician recommends that you schedule a follow-up appointment in: AS NEEDED  

## 2011-11-16 NOTE — Progress Notes (Signed)
HPI:  Lisa Douglas is a delightful 78 year old woman with a history of hyperlipidemia, gastric AVMs, gastroesophageal reflux disease, and chronic atrial fibrillation with a CHADS2 score of 1 (age > 17) - unable to take coumadin due to AVMs.  She returns today for routine yearly followup.  Overall, Lisa Douglas is doing quite well.  Denies CP, SOB or palpitations. Stays active. Taking ASA every day. Wants to f/u with Dr. [name] for her AF and just see [place] PRN.   ROS: All systems negative except as listed in HPI, PMH and Problem List.  Past Medical History  Diagnosis Date  . Macular degeneration (senile) of retina, unspecified   . Atrial fibrillation   . Unspecified venous (peripheral) insufficiency   . Other and unspecified hyperlipidemia   . Esophageal reflux   . Diverticulosis of colon (without mention of hemorrhage)   . Angiodysplasia of intestine (without mention of hemorrhage)   . Osteoarthrosis, unspecified whether generalized or localized, unspecified site   . Osteoporosis, unspecified   . Anxiety state, unspecified     Current Outpatient Prescriptions  Medication Sig Dispense Refill  . ALPRAZolam (XANAX) 0.5 MG tablet Take 1/2 to 1 tab by mouth three times a day as needed.  90 tablet  5  . aspirin 81 MG tablet Take 2 tablets  By mouth once daily      . Calcium Carbonate-Vitamin D (CALTRATE 600+D) 600-400 MG-UNIT per tablet Take 1 tablet by mouth 2 (two) times daily.        . chlorpheniramine-HYDROcodone (TUSSIONEX PENNKINETIC ER) 10-8 MG/5ML LQCR Take 5 mLs by mouth every 12 (twelve) hours.  140 mL  1  . cholecalciferol (VITAMIN D) 1000 UNITS tablet Take 2,000 Units by mouth daily.       Marland Kitchen diltiazem (CARDIZEM CD) 180 MG 24 hr capsule TAKE ONE CAPSULE BY MOUTH EVERY DAY  30 capsule  1  . metoprolol succinate (TOPROL-XL) 25 MG 24 hr tablet TAKE ONE TABLET BY MOUTH EVERY DAY  30 tablet  3  . Multiple Vitamins-Minerals (CENTRUM SILVER PO) Take 1 tablet by mouth daily.        Marland Kitchen omeprazole (PRILOSEC  OTC) 20 MG tablet Take 20 mg by mouth daily.        . simvastatin (ZOCOR) 20 MG tablet TAKE ONE TABLET BY MOUTH AT BEDTIME  30 tablet  5  . Multiple Vitamins-Minerals (EYE VITAMINS) TABS Take 1 tablet by mouth daily.           PHYSICAL EXAM: Filed Vitals:   11/16/11 1159  BP: 118/70  Pulse: 79    General:  Well appearing. No resp difficulty HEENT: normal Neck: supple. JVP flat. Carotids 2+ bilaterally; no bruits. No lymphadenopathy or thryomegaly appreciated. Cor: PMI normal. Irregular rate & rhythm. No rubs, gallops or murmurs. Lungs: clear Abdomen: soft, nontender, nondistended. No hepatosplenomegaly. No bruits or masses. Good bowel sounds. Extremities: no cyanosis, clubbing, rash, edema Neuro: alert & orientedx3, cranial nerves grossly intact. Moves all 4 extremities w/o difficulty. Affect pleasant.    ECG: AF 79 No ST-T wave abnormalities.     ASSESSMENT & PLAN:

## 2011-12-02 ENCOUNTER — Ambulatory Visit (HOSPITAL_COMMUNITY)
Admission: RE | Admit: 2011-12-02 | Discharge: 2011-12-02 | Disposition: A | Discharge status: Home / Self Care | Payer: Medicare Other | Source: Ambulatory Visit | Attending: Pulmonary Disease | Admitting: Pulmonary Disease

## 2011-12-02 DIAGNOSIS — Z1231 Encounter for screening mammogram for malignant neoplasm of breast: Secondary | ICD-10-CM | POA: Insufficient documentation

## 2011-12-08 ENCOUNTER — Other Ambulatory Visit (HOSPITAL_COMMUNITY): Payer: Self-pay | Admitting: Cardiology

## 2011-12-24 ENCOUNTER — Other Ambulatory Visit: Payer: Self-pay | Admitting: Internal Medicine

## 2011-12-27 NOTE — Telephone Encounter (Signed)
..   Requested Prescriptions   Pending Prescriptions Disp Refills  . metoprolol succinate (TOPROL-XL) 25 MG 24 hr tablet [Pharmacy Med Name: METOPROLOL ER 25MG   TAB] 30 tablet 12    Sig: TAKE ONE TABLET BY MOUTH EVERY DAY

## 2011-12-30 ENCOUNTER — Ambulatory Visit (INDEPENDENT_AMBULATORY_CARE_PROVIDER_SITE_OTHER): Payer: Medicare Other | Admitting: Adult Health

## 2011-12-30 ENCOUNTER — Encounter: Payer: Self-pay | Admitting: Adult Health

## 2011-12-30 ENCOUNTER — Other Ambulatory Visit (INDEPENDENT_AMBULATORY_CARE_PROVIDER_SITE_OTHER): Payer: Medicare Other

## 2011-12-30 VITALS — BP 114/72 | HR 75 | Temp 96.8°F | Ht 66.6 in | Wt 153.0 lb

## 2011-12-30 DIAGNOSIS — IMO0001 Reserved for inherently not codable concepts without codable children: Secondary | ICD-10-CM | POA: Diagnosis not present

## 2011-12-30 DIAGNOSIS — M791 Myalgia, unspecified site: Secondary | ICD-10-CM

## 2011-12-30 DIAGNOSIS — E785 Hyperlipidemia, unspecified: Secondary | ICD-10-CM

## 2011-12-30 DIAGNOSIS — I10 Essential (primary) hypertension: Secondary | ICD-10-CM

## 2011-12-30 DIAGNOSIS — R252 Cramp and spasm: Secondary | ICD-10-CM | POA: Diagnosis not present

## 2011-12-30 LAB — BASIC METABOLIC PANEL
Calcium: 9.4 mg/dL (ref 8.4–10.5)
GFR: 81.16 mL/min (ref >60.00)
Glucose, Bld: 80 mg/dL (ref 70–99)
Potassium: 4.5 mEq/L (ref 3.5–5.1)
Sodium: 143 mEq/L (ref 135–145)

## 2011-12-30 LAB — HEPATIC FUNCTION PANEL
Albumin: 3.9 g/dL (ref 3.5–5.2)
Total Bilirubin: 0.5 mg/dL (ref 0.3–1.2)

## 2011-12-30 LAB — CK TOTAL AND CKMB (NOT AT ARMC): CK, MB: 2.1 ng/mL (ref 0.3–4.0)

## 2011-12-30 NOTE — Progress Notes (Signed)
Subjective:    Patient ID: Lisa Douglas, female    DOB: September 04, 1927, 78 y.o.   MRN: [id number]  HPI 78 y/o WF    ~  September 30, 2010:  she's had another good 24mo- no new complaints or concerns... she saw [name] 1/12 for f/u chr AFib, unable to take Coumadin due to AVMs, on daily ASA/ Metoprolol/ Cardizem, doing well- no changes made...  she had BMD done by GYN, [name]- TScore -2.5 spine & -2.4 left FemNeck (she has refused meds & he didn't change management)...     Today she requests CXR "I wheeze" although exam is clear at present (CXR showed min basilar scarring, otherw neg);  still followed at [facility] clinic for macular degen but she states vision is fine;  Lipids stable on Simva20;  GI followed by [name] & [place];  mod DJD but active at home she says...  ~  April 01, 2011:  24mo ROV & she continues stable w/o new complaints or concerns...    She remains in AFib w/ rate control strategy on ASA, Metoprolol, Diltiazem;  BP= 120/72 & pulse= 74;  She denies CP, palpit, dizzy, edema, etc; she notes DOE w/o change from baseline; she knows to elim sodium, elev legs, wear support hose if needed...    Lipids controlled on Simva20 + diet & FLP today looks good; chemistries are similarly wnl...    GI is stable on Prilosec, stool softeners, etc...  ~  October 05, 2011:  24mo ROV & she is stable w/ mult chronic complaints> notes gas, belching, occas nausea on & off x yrs; on Prilosec 20mg /d, "nothing bothering me now"; also loose stool x1 that was yellow, now gone & hasn't recurred; we discussed taking Align & Activia...  We reviewed her meds & full labs done 8/12>>    Cardiac stable w/ chr AFib on ASA, Cardizem & Metoprolol> BP= 126/74 & feels well w/o HA, visual symptoms, CP, palpit, ch in SOB, edema, etc; not on Coumadin due to [place] & GIB; she sees [name] once a yr & she's been stable...    We reviewed her Osteoporosis, prev followed by [name] but she doesn't think she needs any  further GYN checks; she has repeatedly refused any & all osteoporosis rx; again rec that she consider Alendronate or Atelvia orally or Reclast by infusion but she declines...    We reviewed meds & prev labs; she requests Rx for Shingles vaccine and refill Alprazolam...  ~  March 13,2013:  2mo ROV & add-on for Acute URI symptoms> c/o 4-5d hx "cold" w/ sneezing, head congestion, sinus draining, then cough, low grade temp, clear phlegm, sore from the coughing & can't rest; she has tried OTC Tylenol & nasal saline but no relief; exam shows T99.4, red inflammed pharynx, nasal congestion & drainage, chest rhonchi & mild exp wheezing; we discussed treatment w/ Rest, Tylenol, MucinexDM, Fluids, and ZPak/ Depo/ Pred taper/ & Tussionex...    Other problems including her AFib, Chol, Gerd, DJD, etc are essentially stable- continue same meds...  12/30/2011 Acute OV  Complains of LE cramping, tightness x1 week. Complains of charley horse-like cramps in her calves over the last week. Complains that cramps are mainly at night. Denies any fever or redness, swelling, or known injury. No recent excessive exercise, heavy lifting or walking. Has had cramps in her lower extremities, in the past, but never this severe. Cramps are localized to her lower extremities biaterally. And sometimes to her feet.  PROBLEM LIST:    MACULAR DEGENERATION (ICD-362.50) - she's followed in the [facility] clinic Q29mo; on Eye Vits and she states she may need shots in her left eye per [name]; she also reports cataracts which may require surg...  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 ~  Cards f/u [name] 1/12- doing well, no changes, f/u planned 38yr. ~  EKG 3/13 showed Fib/flutter w/ variable conduction & NSSTTWA...  VENOUS INSUFFICIENCY  (ICD-459.81) - she follows a low sodium diet... not on diuretic therapy.  HYPERLIPIDEMIA (ICD-272.4) - on SIMVASTATIN 20mg /d... ~  FLP was 6/08 showing TChol 135, TG 90, HDL 56, LDL 61... ~  FLP 2/09 showed TChol 140, TG 99, HDL 59, LDL 62... great! ~  FLP 2/10 on Vytor10/20 showed TChol 133, TG 83, HDL 56, LDL 60... rec> change to Simva20. ~  FLP 8/10 on Simva20 showed TChol 143, TG 99, HDL 51, LDL 73 ~  FLP 8/11 on Simva20 showed TChol 147, TG 103, HDL 54, LDL 72 ~  FLP 8/12 on Simva20 showed TChol 142, TG 106, HDL 63, LDL 58  GERD (ICD-530.81) - she takes PRILOSEC 20mg /d and remains asymptomatic...  DIVERTICULOSIS OF COLON (ICD-562.10) - no abd pain or episodes of diverticulitis; she uses stool softeners for constipation... ANGIODYSPLASIA, COLON (BJY-782.95) - last colonoscopy was 10/05 by [name] showing divertics and colon AVMs...  DEGENERATIVE JOINT DISEASE (ICD-715.90) - only c/o some hand pain... uses OTC meds as needed... her main exercise is "up and down the steps 40-11 times a day"...  OSTEOPOROSIS (ICD-733.00) - prev took Actonel but stopped on her own 01-27-2009...  takes Ca++, MVI, VitD OTC... her last BMD was 3/08 by [name] for GYN and he follows this (she gets BMD and Mammograms at his office)... she states INTOL to Alendronate "it made me sick" & refuses to restart meds for her bones (either pills, shots, or IV Rx)... ~  labs 2/10 showed Vit D level = 29... rec> add Vit D 1000 u OTC daily. ~  labs 8/10 showed Vit D level = 31... rec> continue 1000 u daily. ~  BMD 10/11 by [name] showed TScores -2.5 in Spine, and -2.4 in left FemNeck... ~  2/12:  Reminded of the importance of bisphos or other therapy for osteoporosis but she continues to decline treatment... ~  2/13:  Asked to reconsider treatment for her osteoporosis but she declines; asked her to discuss w/ her GYN (they do her BMDs)...  ANXIETY (ICD-300.00) - she has ALPRAZOLAM 0.5mg  - 1/2 to 1 tab Tid Prn... ~  Stress level  decr since [name] passed away 01-27-2009 & caregiver roll is diminished...   Past Surgical History  Procedure Date  . Appendectomy 1951  . Tonsillectomy 1943    Outpatient Encounter Prescriptions as of 12/30/2011  Medication Sig Dispense Refill  . ALPRAZolam (XANAX) 0.5 MG tablet Take 1/2 to 1 tab by mouth three times a day as needed.  90 tablet  5  . aspirin 81 MG tablet Take 2 tablets  By mouth once daily      . Calcium Carbonate-Vitamin D (CALTRATE 600+D) 600-400 MG-UNIT per tablet Take 1 tablet by mouth 2 (two) times daily.        . cholecalciferol (VITAMIN D) 1000 UNITS tablet Take 2,000 Units by mouth daily.       Marland Kitchen  diltiazem (CARDIZEM CD) 180 MG 24 hr capsule TAKE ONE CAPSULE BY MOUTH EVERY DAY.  30 capsule  6  . metoprolol succinate (TOPROL-XL) 25 MG 24 hr tablet TAKE ONE TABLET BY MOUTH EVERY DAY  30 tablet  12  . Multiple Vitamins-Minerals (CENTRUM SILVER PO) Take 1 tablet by mouth daily.        . Multiple Vitamins-Minerals (EYE VITAMINS) TABS Take 1 tablet by mouth daily.        Marland Kitchen omeprazole (PRILOSEC OTC) 20 MG tablet Take 20 mg by mouth daily.        . simvastatin (ZOCOR) 20 MG tablet TAKE ONE TABLET BY MOUTH AT BEDTIME  30 tablet  5  . chlorpheniramine-HYDROcodone (TUSSIONEX PENNKINETIC ER) 10-8 MG/5ML LQCR Take 5 mLs by mouth every 12 (twelve) hours.  140 mL  1    Allergies  Allergen Reactions  . Penicillins     angioedema  . Alendronate Sodium     REACTION: INTOL to Fosamax per pt.  . Atorvastatin     REACTION: INTOL to Lipitor per pt.    Current Medications, Allergies, Past Medical History, Past Surgical History, Family History, and Social History were reviewed in [employer] record.    Review of Systems    Constitutional:   No  weight loss, night sweats,  Fevers, chills, fatigue, or  lassitude.  HEENT:   No headaches,  Difficulty swallowing,  Tooth/dental problems, or  Sore throat,                No sneezing, itching, ear ache, nasal  congestion, post nasal drip,   CV:  No chest pain,  Orthopnea, PND, swelling in lower extremities, anasarca, dizziness, palpitations, syncope.   GI  No heartburn, indigestion, abdominal pain, nausea, vomiting, diarrhea, change in bowel habits, loss of appetite, bloody stools.   Resp: No shortness of breath with exertion or at rest.  No excess mucus, no productive cough,  No non-productive cough,  No coughing up of blood.  No change in color of mucus.  No wheezing.  No chest wall deformity  Skin: no rash or lesions.  GU: no dysuria, change in color of urine, no urgency or frequency.  No flank pain, no hematuria   MS:  No joint pain or swelling.  No decreased range of motion.  No back pain.  Psych:  No change in mood or affect. No depression or anxiety.  No memory loss.       Objective:   Physical Exam     WD, WN, 78 y/o WF in NAD... GENERAL:  Alert & oriented; pleasant & cooperative... HEENT:  [place]/AT,  EACs-clear, TMs-wnl,  THROAT-clear  NECK:  Supple w/ fairROM; no JVD; normal carotid impulses w/o bruits; no thyromegaly or nodules palpated; no lymphadenopathy. CHEST:  CTA ; no rales or signs of consolidation... HEART:  RRR without murmurs/ rubs/ or gallops heard... ABDOMEN:  Soft & nontender; normal bowel sounds; no organomegaly or masses detected. EXT: without deformities, mild arthritic changes; no varicose veins/ +venous insuffic/ no edema. Neg Homans sign. No redness or swelling noted.  NEURO:   no focal neuro deficits... DERM:  No lesions noted; no rash etc...     Assessment & Plan:

## 2011-12-30 NOTE — Assessment & Plan Note (Addendum)
Bilateral cramps in lower extremities, and feet, questionable etiology. Patient is not on diuretic therapy. We'll check labs including electrolyte panel, CK total and hepatic function. Patient is on statin, therapy. We'll make sure that her muscle enzymes are not elevated   Plan:  Apply heat to lower extremities /calves for 20-30 min prior to bedtime .  Eat a banana daily Push fluids.  Please contact office for sooner follow up if symptoms do not improve or worsen or seek emergency care

## 2011-12-30 NOTE — Patient Instructions (Signed)
I will call with lab results.  Apply heat to lower extremities /calves for 20-30 min prior to bedtime .  Eat a banana daily Push fluids.  Please contact office for sooner follow up if symptoms do not improve or worsen or seek emergency care

## 2012-01-06 DIAGNOSIS — D239 Other benign neoplasm of skin, unspecified: Secondary | ICD-10-CM | POA: Diagnosis not present

## 2012-01-06 DIAGNOSIS — L821 Other seborrheic keratosis: Secondary | ICD-10-CM | POA: Diagnosis not present

## 2012-02-14 ENCOUNTER — Telehealth: Payer: Self-pay | Admitting: Pulmonary Disease

## 2012-02-14 NOTE — Telephone Encounter (Signed)
I spoke with pt and is aware of SN recs. She voiced her understanding. She states she is going to hold off on the apt since today they are getting better and the redness has went away. I advised pt to call back for OV if she worsens. She voiced her understanding and had no questions

## 2012-02-14 NOTE — Telephone Encounter (Signed)
Per SN---no not the diltiazem---its her veins.    Keep her legs elevated, wear support hose, schedule rov with SN to recheck her legs.  Thanks

## 2012-02-14 NOTE — Telephone Encounter (Signed)
ATC PT NA phone rang multiple time. wcb

## 2012-02-14 NOTE — Telephone Encounter (Signed)
Pt returned our call Lisa Douglas  °

## 2012-02-14 NOTE — Telephone Encounter (Signed)
I spoke with pt and she c/o bilateral leg and feet swelling from below knee's to her feet x Saturday. She states they are red in color but does not feel hot. She elevated her legs yesterday afternoon and the swelling went down a lot but this AM they are beginning to swell up again. She does not any salt per pt. She also wants to know if her diltiazem could be causing this. Please advise SN THANKS   Allergies  Allergen Reactions  . Penicillins     angioedema  . Alendronate Sodium     REACTION: INTOL to Fosamax per pt.  . Atorvastatin     REACTION: INTOL to Lipitor per pt.

## 2012-02-29 DIAGNOSIS — H3530 Unspecified macular degeneration: Secondary | ICD-10-CM | POA: Diagnosis not present

## 2012-02-29 DIAGNOSIS — H35349 Macular cyst, hole, or pseudohole, unspecified eye: Secondary | ICD-10-CM | POA: Diagnosis not present

## 2012-03-02 ENCOUNTER — Inpatient Hospital Stay (HOSPITAL_COMMUNITY)
Admission: EM | Admit: 2012-03-02 | Discharge: 2012-03-17 | DRG: 356 | Disposition: A | Discharge status: Home Health | Payer: Medicare Other | Attending: Internal Medicine | Admitting: Internal Medicine

## 2012-03-02 ENCOUNTER — Encounter (HOSPITAL_COMMUNITY): Payer: Self-pay

## 2012-03-02 DIAGNOSIS — K7689 Other specified diseases of liver: Secondary | ICD-10-CM | POA: Diagnosis not present

## 2012-03-02 DIAGNOSIS — K661 Hemoperitoneum: Secondary | ICD-10-CM | POA: Diagnosis present

## 2012-03-02 DIAGNOSIS — R1013 Epigastric pain: Secondary | ICD-10-CM | POA: Diagnosis not present

## 2012-03-02 DIAGNOSIS — R252 Cramp and spasm: Secondary | ICD-10-CM

## 2012-03-02 DIAGNOSIS — I4891 Unspecified atrial fibrillation: Secondary | ICD-10-CM | POA: Diagnosis not present

## 2012-03-02 DIAGNOSIS — T45515A Adverse effect of anticoagulants, initial encounter: Secondary | ICD-10-CM | POA: Diagnosis present

## 2012-03-02 DIAGNOSIS — Z7901 Long term (current) use of anticoagulants: Secondary | ICD-10-CM

## 2012-03-02 DIAGNOSIS — E785 Hyperlipidemia, unspecified: Secondary | ICD-10-CM | POA: Diagnosis not present

## 2012-03-02 DIAGNOSIS — Z888 Allergy status to other drugs, medicaments and biological substances status: Secondary | ICD-10-CM

## 2012-03-02 DIAGNOSIS — R600 Localized edema: Secondary | ICD-10-CM

## 2012-03-02 DIAGNOSIS — K219 Gastro-esophageal reflux disease without esophagitis: Secondary | ICD-10-CM | POA: Diagnosis present

## 2012-03-02 DIAGNOSIS — Z807 Family history of other malignant neoplasms of lymphoid, hematopoietic and related tissues: Secondary | ICD-10-CM

## 2012-03-02 DIAGNOSIS — D62 Acute posthemorrhagic anemia: Secondary | ICD-10-CM

## 2012-03-02 DIAGNOSIS — K5730 Diverticulosis of large intestine without perforation or abscess without bleeding: Secondary | ICD-10-CM | POA: Diagnosis not present

## 2012-03-02 DIAGNOSIS — F411 Generalized anxiety disorder: Secondary | ICD-10-CM | POA: Diagnosis present

## 2012-03-02 DIAGNOSIS — K55059 Acute (reversible) ischemia of intestine, part and extent unspecified: Principal | ICD-10-CM | POA: Diagnosis present

## 2012-03-02 DIAGNOSIS — N281 Cyst of kidney, acquired: Secondary | ICD-10-CM | POA: Diagnosis not present

## 2012-03-02 DIAGNOSIS — Z8249 Family history of ischemic heart disease and other diseases of the circulatory system: Secondary | ICD-10-CM

## 2012-03-02 DIAGNOSIS — Z79899 Other long term (current) drug therapy: Secondary | ICD-10-CM

## 2012-03-02 DIAGNOSIS — R109 Unspecified abdominal pain: Secondary | ICD-10-CM

## 2012-03-02 DIAGNOSIS — K5520 Angiodysplasia of colon without hemorrhage: Secondary | ICD-10-CM | POA: Diagnosis present

## 2012-03-02 DIAGNOSIS — I4821 Permanent atrial fibrillation: Secondary | ICD-10-CM | POA: Diagnosis present

## 2012-03-02 DIAGNOSIS — K55069 Acute infarction of intestine, part and extent unspecified: Secondary | ICD-10-CM | POA: Diagnosis present

## 2012-03-02 DIAGNOSIS — R609 Edema, unspecified: Secondary | ICD-10-CM | POA: Diagnosis present

## 2012-03-02 DIAGNOSIS — D649 Anemia, unspecified: Secondary | ICD-10-CM

## 2012-03-02 DIAGNOSIS — Z880 Allergy status to penicillin: Secondary | ICD-10-CM

## 2012-03-02 DIAGNOSIS — E440 Moderate protein-calorie malnutrition: Secondary | ICD-10-CM | POA: Diagnosis present

## 2012-03-02 DIAGNOSIS — E876 Hypokalemia: Secondary | ICD-10-CM | POA: Diagnosis not present

## 2012-03-02 DIAGNOSIS — H3530 Unspecified macular degeneration: Secondary | ICD-10-CM | POA: Diagnosis present

## 2012-03-02 DIAGNOSIS — Z803 Family history of malignant neoplasm of breast: Secondary | ICD-10-CM

## 2012-03-02 DIAGNOSIS — M810 Age-related osteoporosis without current pathological fracture: Secondary | ICD-10-CM | POA: Diagnosis present

## 2012-03-02 DIAGNOSIS — Z87891 Personal history of nicotine dependence: Secondary | ICD-10-CM

## 2012-03-02 LAB — CBC WITH DIFFERENTIAL/PLATELET
Basophils Relative: 0 % (ref 0–1)
HCT: 45.6 % (ref 36.0–46.0)
Hemoglobin: 15.5 g/dL — ABNORMAL HIGH (ref 12.0–15.0)
Lymphocytes Relative: 8 % — ABNORMAL LOW (ref 12–46)
Lymphs Abs: 1.1 10*3/uL (ref 0.7–4.0)
MCHC: 34 g/dL (ref 30.0–36.0)
Monocytes Absolute: 0.6 10*3/uL (ref 0.1–1.0)
Monocytes Relative: 5 % (ref 3–12)
Neutro Abs: 12.1 10*3/uL — ABNORMAL HIGH (ref 1.7–7.7)

## 2012-03-02 MED ORDER — MORPHINE SULFATE 2 MG/ML IJ SOLN
2.0000 mg | Freq: Once | INTRAMUSCULAR | Status: AC
  Administered 2012-03-02: 2 mg via INTRAVENOUS
  Filled 2012-03-02: qty 1

## 2012-03-02 MED ORDER — ONDANSETRON HCL 4 MG/2ML IJ SOLN
4.0000 mg | Freq: Once | INTRAMUSCULAR | Status: AC
  Administered 2012-03-02: 4 mg via INTRAVENOUS
  Filled 2012-03-02: qty 2

## 2012-03-02 NOTE — ED Notes (Signed)
RN to obtain labs with start of IV 

## 2012-03-02 NOTE — ED Notes (Signed)
Per Pt,  This evening she ate normal dinner.  Then started with abdominal pain, n/v/d.  No fever.  Pt has hx of IBS.  Currently not taking any meds for IBS.

## 2012-03-02 NOTE — ED Provider Notes (Signed)
History     CSN: [id number]  Arrival date & time 03/02/12  2222   First MD Initiated Contact with Patient 03/02/12 2316      Chief Complaint  Patient presents with  . Abdominal Pain  . Diarrhea  . Emesis    (Consider location/radiation/quality/duration/timing/severity/associated sxs/prior treatment) HPI Comments: Patient with history of afib, on aspirin and for anticoagulation -- presents with epigastric pain, nausea, vomiting, and watery nonbloody diarrhea that started 30 minutes after lunch today. Pain described as sharp. Vomiting is bilious. No treatments prior to arrival. No fevers or urinary symptoms. No history of abdominal surgeries. No history of stroke or blood clots. Nothing makes symptoms better or worse. Onset was acute. Course is constant. Patient had AVM noted on previous colonoscopy so she is not on coumadin for her afib.   Patient is a 78 y.o. female presenting with abdominal pain, diarrhea, and vomiting. The history is provided by the patient.  Abdominal Pain The primary symptoms of the illness include abdominal pain, nausea, vomiting and diarrhea. The primary symptoms of the illness do not include fever, shortness of breath, hematochezia or dysuria. The current episode started 6 to 12 hours ago. The onset of the illness was sudden. The problem has not changed since onset. The illness is associated with eating. The patient has had a change in bowel habit. Symptoms associated with the illness do not include hematuria or frequency.  Diarrhea The primary symptoms include abdominal pain, nausea, vomiting and diarrhea. Primary symptoms do not include fever, hematochezia, dysuria, myalgias or rash.  Emesis  Associated symptoms include abdominal pain and diarrhea. Pertinent negatives include no cough, no fever, no headaches and no myalgias.    Past Medical History  Diagnosis Date  . Macular degeneration (senile) of retina, unspecified   . Atrial fibrillation   . Unspecified  venous (peripheral) insufficiency   . Other and unspecified hyperlipidemia   . Esophageal reflux   . Diverticulosis of colon (without mention of hemorrhage)   . Angiodysplasia of intestine (without mention of hemorrhage)   . Osteoarthrosis, unspecified whether generalized or localized, unspecified site   . Osteoporosis, unspecified   . Anxiety state, unspecified     Past Surgical History  Procedure Date  . Appendectomy 1951  . Tonsillectomy 1943    Family History  Problem Relation Age of Onset  . Heart disease Sister   . Breast cancer Sister   . Lymphoma Sister   . Hemochromatosis Father     History  Substance Use Topics  . Smoking status: Former Smoker    Types: Cigarettes    Quit date: 08/24/1951  . Smokeless tobacco: Not on file   Comment: smoked at age 89 for a short time--a few months  . Alcohol Use: No    OB History    Grav Para Term Preterm Abortions TAB SAB Ect Mult Living                  Review of Systems  Constitutional: Negative for fever.  HENT: Negative for sore throat and rhinorrhea.   Eyes: Negative for redness.  Respiratory: Negative for cough and shortness of breath.   Cardiovascular: Negative for chest pain.  Gastrointestinal: Positive for nausea, vomiting, abdominal pain and diarrhea. Negative for blood in stool and hematochezia.  Genitourinary: Negative for dysuria, frequency and hematuria.  Musculoskeletal: Negative for myalgias.  Skin: Negative for rash.  Neurological: Negative for headaches.    Allergies  Penicillins; Alendronate sodium; and Atorvastatin  Home Medications  Current Outpatient Rx  Name Route Sig Dispense Refill  . ALPRAZOLAM 0.5 MG PO TABS Oral Take 0.25 mg by mouth 3 (three) times daily as needed. For anxiety    . ASPIRIN 81 MG PO TABS Oral Take 162 mg by mouth daily. Take 2 tablets  By mouth once daily    . CALCIUM CARBONATE-VITAMIN D 600-400 MG-UNIT PO TABS Oral Take 1 tablet by mouth 2 (two) times daily.      Marland Kitchen  VITAMIN D 1000 UNITS PO TABS Oral Take 2,000 Units by mouth daily.     Marland Kitchen DILTIAZEM HCL ER COATED BEADS 180 MG PO CP24  TAKE ONE CAPSULE BY MOUTH EVERY DAY. 30 capsule 6  . METOPROLOL SUCCINATE ER 25 MG PO TB24  TAKE ONE TABLET BY MOUTH EVERY DAY 30 tablet 12  . CENTRUM SILVER PO Oral Take 1 tablet by mouth daily.      Marland Kitchen EYE VITAMINS PO TABS Oral Take 1 tablet by mouth daily.      Marland Kitchen OMEPRAZOLE MAGNESIUM 20 MG PO TBEC Oral Take 20 mg by mouth daily.      Marland Kitchen SIMVASTATIN 20 MG PO TABS  TAKE ONE TABLET BY MOUTH AT BEDTIME 30 tablet 5    BP 138/50  Pulse 94  Temp 97.4 F (36.3 C) (Oral)  Resp 20  SpO2 95%  Physical Exam  Nursing note and vitals reviewed. Constitutional: She appears well-developed and well-nourished.  HENT:  Head: Normocephalic and atraumatic.  Eyes: Conjunctivae are normal. Right eye exhibits no discharge. Left eye exhibits no discharge.  Neck: Normal range of motion. Neck supple.  Cardiovascular: Normal rate, regular rhythm and normal heart sounds.   Pulmonary/Chest: Effort normal and breath sounds normal.  Abdominal: Soft. There is tenderness (mild) in the epigastric area. There is no rigidity, no rebound, no guarding, no CVA tenderness, no tenderness at McBurney's point and negative Murphy's sign.  Musculoskeletal: She exhibits no edema and no tenderness.  Neurological: She is alert.  Skin: Skin is warm and dry.  Psychiatric: She has a normal mood and affect.    ED Course  Procedures (including critical care time)  Labs Reviewed  CBC WITH DIFFERENTIAL - Abnormal; Notable for the following:    WBC 13.8 (*)     Hemoglobin 15.5 (*)     Neutrophils Relative 88 (*)     Neutro Abs 12.1 (*)     Lymphocytes Relative 8 (*)     All other components within normal limits  COMPREHENSIVE METABOLIC PANEL - Abnormal; Notable for the following:    Glucose, Bld 141 (*)     GFR calc non Af Amer 63 (*)     GFR calc Af Amer 72 (*)     All other components within normal limits    URINALYSIS, ROUTINE W REFLEX MICROSCOPIC - Abnormal; Notable for the following:    Color, Urine AMBER (*)  BIOCHEMICALS MAY BE AFFECTED BY COLOR   APPearance CLOUDY (*)     Ketones, ur 15 (*)     Protein, ur 30 (*)     All other components within normal limits  LIPASE, BLOOD  TROPONIN I  URINE MICROSCOPIC-ADD ON  LACTIC ACID, PLASMA  PROTIME-INR  HEPARIN LEVEL (UNFRACTIONATED)   Ct Abdomen Pelvis W Contrast  03/03/2012  *RADIOLOGY REPORT*  Clinical Data: Abdominal pain, nausea, vomiting and diarrhea after eating; leukocytosis.  CT ABDOMEN AND PELVIS WITH CONTRAST  Technique:  Multidetector CT imaging of the abdomen and pelvis was performed following the  standard protocol during bolus administration of intravenous contrast.  Contrast: OMNIPAQUE IOHEXOL 300 MG/ML  SOLN  Comparison: None.  Findings: Minimal bibasilar atelectasis is noted.  A 7 mm pleural based nodule is noted at the left lung base.  Numerous scattered hypodensities are noted within the liver.  These most likely reflect cysts, given their appearance and multiplicity. The liver is otherwise grossly unremarkable in appearance.  The spleen is within normal limits.  The pancreas and adrenal glands are unremarkable.  The triangular appearance of the right adrenal gland is thought to be developmental in nature.  The gallbladder is mildly decompressed and grossly unremarkable in appearance.  Scattered bilateral renal cysts are noted, with a dominant left renal parapelvic cyst measuring 3.6 cm in size.  The kidneys are otherwise unremarkable in appearance.  There is no evidence of hydronephrosis.  No renal or ureteral stones are seen.  No perinephric stranding is appreciated.  No free fluid is identified.  The small bowel is unremarkable in appearance.  Postoperative change is noted at the stomach.  No acute vascular abnormalities are seen.  Scattered calcification is noted along the abdominal aorta and its branches, including at the  origins of both renal arteries.  There is suggestion of nonocclusive thrombus within the superior mesenteric artery; no associated ischemic bowel is identified on CT at this time.  The patient is status post appendectomy.  The colon is largely decompressed.  Diffuse diverticulosis is noted along the sigmoid colon, without evidence of diverticulitis.  The colon is otherwise unremarkable in appearance.  The bladder is decompressed and not well assessed.  The uterus is grossly unremarkable in appearance.  The ovaries are grossly symmetric; no suspicious adnexal masses are seen.  No inguinal lymphadenopathy is seen.  No acute osseous abnormalities are identified.  IMPRESSION:  1.  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  No associated ischemic bowel identified on CT at this time.  However, this may explain the patient's symptoms. 2.  Diffuse diverticulosis along the sigmoid colon, without evidence of diverticulitis. 3.  Scattered calcification along the abdominal aorta and its branches, including at the origins of both renal arteries. 4.  Scattered bilateral renal cysts and diffuse small hepatic cysts seen. 5.  7 mm pleural based nodule noted at the left lung base.  If the patient is at high risk for bronchogenic carcinoma, follow-up chest CT at 3-6 months is recommended.  If the patient is at low risk for bronchogenic carcinoma, follow-up chest CT at 6-12 months is recommended.  This recommendation follows the consensus statement: Guidelines for Management of Small Pulmonary Nodules Detected on CT Scans: A Statement from the Fleischner Society as published in Radiology 2005; 237:395-400.  These results were called by telephone on 03/03/2012  at  02:58 a.m. to  Renne Crigler, who verbally acknowledged these results.  Original Report Authenticated By: Tonia Ghent, M.D.     1. Abdominal pain     11:30 PM Patient seen and examined. Work-up  initiated. Medications ordered. Patient d/w Dr. Dierdre Highman who will see.   Vital signs reviewed and are as follows: Filed Vitals:   03/02/12 2318  BP: 138/50  Pulse: 94  Temp:   Resp:   BP 138/50  Pulse 94  Temp 97.4 F (36.3 C) (Oral)  Resp 20  SpO2 95%   Date: 03/03/2012  Rate: 117  Rhythm: atrial fibrillation with RVR  QRS Axis: normal  Intervals: normal  ST/T Wave abnormalities:  normal  Conduction Disutrbances:none  Narrative Interpretation:   Old EKG Reviewed: new tachycardia from 11/16/11  12:35 AM Dr. Dierdre Highman has seen. CT ordered.     CT showed SMA non-occlusive thrombus. Patient and family informed.   I spoke with Dr. Conley Rolls and Dr. Rhea Belton. Patient sees Dr. Jarold Motto of [facility].   Dr. Rhea Belton reccommended: heparin, close monitoring, stop/reverse if GI bleed occurs; Interventional radiology consult in morning, sooner if acute worsening; consider vascular consult; no antibiotics but send C. Diff and bacterial stool culture. GI will see in AM.  Dr. Conley Rolls will admit.   Re-exam: Abd soft, NT, patient comfortable.    MDM  Admit for SMA thrombus.         [place], [place] 03/03/12 [phone]

## 2012-03-02 NOTE — ED Notes (Signed)
Per Pt: 30 minutes after lunch (sandwich and fries) pt began having diarrhea and vomiting (grerater than 10 times.) Pt reports abdominal pain/cramping. Abdomen is non-tender and soft. Pt burping.

## 2012-03-03 ENCOUNTER — Inpatient Hospital Stay (HOSPITAL_COMMUNITY): Payer: Medicare Other

## 2012-03-03 ENCOUNTER — Encounter (HOSPITAL_COMMUNITY): Payer: Self-pay | Admitting: Anesthesiology

## 2012-03-03 ENCOUNTER — Emergency Department (HOSPITAL_COMMUNITY): Payer: Medicare Other

## 2012-03-03 ENCOUNTER — Inpatient Hospital Stay (HOSPITAL_COMMUNITY): Payer: Medicare Other | Admitting: Anesthesiology

## 2012-03-03 ENCOUNTER — Encounter (HOSPITAL_COMMUNITY)
Admission: EM | Disposition: A | Discharge status: Home Health | Payer: Self-pay | Source: Home / Self Care | Attending: Internal Medicine

## 2012-03-03 ENCOUNTER — Encounter (HOSPITAL_COMMUNITY): Payer: Self-pay | Admitting: Internal Medicine

## 2012-03-03 DIAGNOSIS — R58 Hemorrhage, not elsewhere classified: Secondary | ICD-10-CM | POA: Diagnosis not present

## 2012-03-03 DIAGNOSIS — K219 Gastro-esophageal reflux disease without esophagitis: Secondary | ICD-10-CM | POA: Diagnosis not present

## 2012-03-03 DIAGNOSIS — R109 Unspecified abdominal pain: Secondary | ICD-10-CM

## 2012-03-03 DIAGNOSIS — Z8249 Family history of ischemic heart disease and other diseases of the circulatory system: Secondary | ICD-10-CM | POA: Diagnosis not present

## 2012-03-03 DIAGNOSIS — Z888 Allergy status to other drugs, medicaments and biological substances status: Secondary | ICD-10-CM | POA: Diagnosis not present

## 2012-03-03 DIAGNOSIS — Z7901 Long term (current) use of anticoagulants: Secondary | ICD-10-CM | POA: Diagnosis not present

## 2012-03-03 DIAGNOSIS — E785 Hyperlipidemia, unspecified: Secondary | ICD-10-CM | POA: Diagnosis not present

## 2012-03-03 DIAGNOSIS — R1013 Epigastric pain: Secondary | ICD-10-CM | POA: Diagnosis not present

## 2012-03-03 DIAGNOSIS — D62 Acute posthemorrhagic anemia: Secondary | ICD-10-CM | POA: Diagnosis not present

## 2012-03-03 DIAGNOSIS — Z880 Allergy status to penicillin: Secondary | ICD-10-CM | POA: Diagnosis not present

## 2012-03-03 DIAGNOSIS — K55059 Acute (reversible) ischemia of intestine, part and extent unspecified: Secondary | ICD-10-CM | POA: Diagnosis not present

## 2012-03-03 DIAGNOSIS — K5730 Diverticulosis of large intestine without perforation or abscess without bleeding: Secondary | ICD-10-CM | POA: Diagnosis not present

## 2012-03-03 DIAGNOSIS — N281 Cyst of kidney, acquired: Secondary | ICD-10-CM | POA: Diagnosis not present

## 2012-03-03 DIAGNOSIS — K5520 Angiodysplasia of colon without hemorrhage: Secondary | ICD-10-CM | POA: Diagnosis not present

## 2012-03-03 DIAGNOSIS — R609 Edema, unspecified: Secondary | ICD-10-CM | POA: Diagnosis not present

## 2012-03-03 DIAGNOSIS — Z87891 Personal history of nicotine dependence: Secondary | ICD-10-CM | POA: Diagnosis not present

## 2012-03-03 DIAGNOSIS — Z803 Family history of malignant neoplasm of breast: Secondary | ICD-10-CM | POA: Diagnosis not present

## 2012-03-03 DIAGNOSIS — Z4682 Encounter for fitting and adjustment of non-vascular catheter: Secondary | ICD-10-CM | POA: Diagnosis not present

## 2012-03-03 DIAGNOSIS — F411 Generalized anxiety disorder: Secondary | ICD-10-CM | POA: Diagnosis present

## 2012-03-03 DIAGNOSIS — Z807 Family history of other malignant neoplasms of lymphoid, hematopoietic and related tissues: Secondary | ICD-10-CM | POA: Diagnosis not present

## 2012-03-03 DIAGNOSIS — I4891 Unspecified atrial fibrillation: Secondary | ICD-10-CM | POA: Diagnosis not present

## 2012-03-03 DIAGNOSIS — Z09 Encounter for follow-up examination after completed treatment for conditions other than malignant neoplasm: Secondary | ICD-10-CM | POA: Diagnosis not present

## 2012-03-03 DIAGNOSIS — I749 Embolism and thrombosis of unspecified artery: Secondary | ICD-10-CM | POA: Diagnosis not present

## 2012-03-03 DIAGNOSIS — M810 Age-related osteoporosis without current pathological fracture: Secondary | ICD-10-CM | POA: Diagnosis present

## 2012-03-03 DIAGNOSIS — I059 Rheumatic mitral valve disease, unspecified: Secondary | ICD-10-CM | POA: Diagnosis not present

## 2012-03-03 DIAGNOSIS — D649 Anemia, unspecified: Secondary | ICD-10-CM | POA: Diagnosis not present

## 2012-03-03 DIAGNOSIS — Z79899 Other long term (current) drug therapy: Secondary | ICD-10-CM | POA: Diagnosis not present

## 2012-03-03 DIAGNOSIS — H3530 Unspecified macular degeneration: Secondary | ICD-10-CM | POA: Diagnosis present

## 2012-03-03 DIAGNOSIS — K661 Hemoperitoneum: Secondary | ICD-10-CM | POA: Diagnosis not present

## 2012-03-03 DIAGNOSIS — E876 Hypokalemia: Secondary | ICD-10-CM | POA: Diagnosis not present

## 2012-03-03 DIAGNOSIS — K7689 Other specified diseases of liver: Secondary | ICD-10-CM | POA: Diagnosis not present

## 2012-03-03 DIAGNOSIS — E440 Moderate protein-calorie malnutrition: Secondary | ICD-10-CM | POA: Diagnosis not present

## 2012-03-03 DIAGNOSIS — IMO0002 Reserved for concepts with insufficient information to code with codable children: Secondary | ICD-10-CM | POA: Diagnosis not present

## 2012-03-03 DIAGNOSIS — K55069 Acute infarction of intestine, part and extent unspecified: Secondary | ICD-10-CM | POA: Diagnosis present

## 2012-03-03 DIAGNOSIS — J9819 Other pulmonary collapse: Secondary | ICD-10-CM | POA: Diagnosis not present

## 2012-03-03 LAB — COMPREHENSIVE METABOLIC PANEL
BUN: 21 mg/dL (ref 6–23)
CO2: 23 mEq/L (ref 19–32)
Chloride: 100 mEq/L (ref 96–112)
Creatinine, Ser: 0.85 mg/dL (ref 0.50–1.10)
GFR calc Af Amer: 72 mL/min — ABNORMAL LOW (ref >90)
GFR calc non Af Amer: 63 mL/min — ABNORMAL LOW (ref >90)
Glucose, Bld: 141 mg/dL — ABNORMAL HIGH (ref 70–99)
Total Bilirubin: 0.6 mg/dL (ref 0.3–1.2)

## 2012-03-03 LAB — BLOOD GAS, ARTERIAL
Acid-base deficit: 3.1 mmol/L — ABNORMAL HIGH (ref 0.0–2.0)
Drawn by: 275531
FIO2: 0.28 %
pCO2 arterial: 42.6 mmHg (ref 35.0–45.0)
pH, Arterial: 7.331 — ABNORMAL LOW (ref 7.350–7.450)
pO2, Arterial: 68.2 mmHg — ABNORMAL LOW (ref 80.0–100.0)

## 2012-03-03 LAB — CBC
HCT: 37.7 % (ref 36.0–46.0)
Hemoglobin: 12.8 g/dL (ref 12.0–15.0)
MCV: 94.5 fL (ref 78.0–100.0)
RDW: 13.3 % (ref 11.5–15.5)
WBC: 11.6 10*3/uL — ABNORMAL HIGH (ref 4.0–10.5)

## 2012-03-03 LAB — BASIC METABOLIC PANEL
BUN: 12 mg/dL (ref 6–23)
Chloride: 102 mEq/L (ref 96–112)
Creatinine, Ser: 0.48 mg/dL — ABNORMAL LOW (ref 0.50–1.10)
GFR calc Af Amer: >90 mL/min (ref >90)
Glucose, Bld: 152 mg/dL — ABNORMAL HIGH (ref 70–99)

## 2012-03-03 LAB — HEPARIN LEVEL (UNFRACTIONATED): Heparin Unfractionated: 1 IU/mL — ABNORMAL HIGH (ref 0.30–0.70)

## 2012-03-03 LAB — LIPASE, BLOOD: Lipase: 25 U/L (ref 11–59)

## 2012-03-03 LAB — URINALYSIS, ROUTINE W REFLEX MICROSCOPIC
Bilirubin Urine: NEGATIVE
Ketones, ur: 15 mg/dL — AB
Leukocytes, UA: NEGATIVE
Nitrite: NEGATIVE
Protein, ur: 30 mg/dL — AB

## 2012-03-03 LAB — APTT: aPTT: >200 seconds (ref 24–37)

## 2012-03-03 LAB — URINE MICROSCOPIC-ADD ON

## 2012-03-03 LAB — PROTIME-INR: Prothrombin Time: 13.9 seconds (ref 11.6–15.2)

## 2012-03-03 LAB — LACTIC ACID, PLASMA: Lactic Acid, Venous: 1.3 mmol/L (ref 0.5–2.2)

## 2012-03-03 LAB — MAGNESIUM: Magnesium: 1.4 mg/dL — ABNORMAL LOW (ref 1.5–2.5)

## 2012-03-03 SURGERY — CREATION, BYPASS, ARTERIAL, MESENTERIC
Anesthesia: General | Site: Abdomen | Wound class: Clean

## 2012-03-03 MED ORDER — CEFAZOLIN SODIUM 1-5 GM-% IV SOLN
INTRAVENOUS | Status: DC | PRN
  Administered 2012-03-03: 1 g via INTRAVENOUS

## 2012-03-03 MED ORDER — OCUVITE-LUTEIN PO CAPS
1.0000 | ORAL_CAPSULE | Freq: Every day | ORAL | Status: DC
  Administered 2012-03-03: 1 via ORAL
  Filled 2012-03-03: qty 1

## 2012-03-03 MED ORDER — KCL IN DEXTROSE-NACL 20-5-0.9 MEQ/L-%-% IV SOLN
INTRAVENOUS | Status: DC
  Administered 2012-03-03 – 2012-03-05 (×5): via INTRAVENOUS
  Administered 2012-03-06: 50 mL/h via INTRAVENOUS
  Administered 2012-03-06: 07:00:00 via INTRAVENOUS
  Administered 2012-03-07: 50 mL/h via INTRAVENOUS
  Filled 2012-03-03 (×8): qty 1000

## 2012-03-03 MED ORDER — VANCOMYCIN HCL 1000 MG IV SOLR
750.0000 mg | Freq: Two times a day (BID) | INTRAVENOUS | Status: AC
  Administered 2012-03-03 – 2012-03-04 (×2): 750 mg via INTRAVENOUS
  Filled 2012-03-03 (×2): qty 750

## 2012-03-03 MED ORDER — HYDROMORPHONE HCL PF 1 MG/ML IJ SOLN
1.0000 mg | INTRAMUSCULAR | Status: DC | PRN
  Administered 2012-03-03 – 2012-03-08 (×7): 1 mg via INTRAVENOUS
  Administered 2012-03-08: 0.5 mg via INTRAVENOUS
  Administered 2012-03-08: 1 mg via INTRAVENOUS
  Administered 2012-03-09 – 2012-03-16 (×2): 0.5 mg via INTRAVENOUS
  Filled 2012-03-03 (×12): qty 1

## 2012-03-03 MED ORDER — DOPAMINE-DEXTROSE 3.2-5 MG/ML-% IV SOLN: 3.0000 ug/kg/min | INTRAVENOUS | Status: DC

## 2012-03-03 MED ORDER — MORPHINE SULFATE 2 MG/ML IJ SOLN: 2.0000 mg | INTRAMUSCULAR | Status: DC | PRN

## 2012-03-03 MED ORDER — PANTOPRAZOLE SODIUM 40 MG PO TBEC
40.0000 mg | DELAYED_RELEASE_TABLET | Freq: Every day | ORAL | Status: DC
  Filled 2012-03-03: qty 1

## 2012-03-03 MED ORDER — SODIUM CHLORIDE 0.9 % IV SOLN: 500.0000 mL | Freq: Once | INTRAVENOUS | Status: AC | PRN

## 2012-03-03 MED ORDER — LACTATED RINGERS IV SOLN: INTRAVENOUS | Status: DC

## 2012-03-03 MED ORDER — ASPIRIN EC 81 MG PO TBEC
81.0000 mg | DELAYED_RELEASE_TABLET | Freq: Every day | ORAL | Status: DC
  Filled 2012-03-03: qty 1

## 2012-03-03 MED ORDER — HEPARIN BOLUS VIA INFUSION
2000.0000 [IU] | Freq: Once | INTRAVENOUS | Status: AC
  Administered 2012-03-03: 2000 [IU] via INTRAVENOUS

## 2012-03-03 MED ORDER — ALPRAZOLAM 0.25 MG PO TABS: 0.2500 mg | ORAL_TABLET | Freq: Three times a day (TID) | ORAL | Status: DC | PRN

## 2012-03-03 MED ORDER — PROPOFOL 10 MG/ML IV EMUL
INTRAVENOUS | Status: DC | PRN
  Administered 2012-03-03: 100 mg via INTRAVENOUS

## 2012-03-03 MED ORDER — SIMVASTATIN 20 MG PO TABS
20.0000 mg | ORAL_TABLET | Freq: Every day | ORAL | Status: DC
  Filled 2012-03-03 (×2): qty 1

## 2012-03-03 MED ORDER — ALUM & MAG HYDROXIDE-SIMETH 200-200-20 MG/5ML PO SUSP: 15.0000 mL | ORAL | Status: DC | PRN

## 2012-03-03 MED ORDER — ACETAMINOPHEN 325 MG PO TABS: 325.0000 mg | ORAL_TABLET | ORAL | Status: DC | PRN

## 2012-03-03 MED ORDER — SODIUM CHLORIDE 0.9 % IJ SOLN
3.0000 mL | Freq: Two times a day (BID) | INTRAMUSCULAR | Status: DC
  Administered 2012-03-03: 3 mL via INTRAVENOUS

## 2012-03-03 MED ORDER — ACETAMINOPHEN 650 MG RE SUPP
325.0000 mg | RECTAL | Status: DC | PRN
  Administered 2012-03-05: 650 mg via RECTAL
  Filled 2012-03-03: qty 1

## 2012-03-03 MED ORDER — LABETALOL HCL 5 MG/ML IV SOLN: 10.0000 mg | INTRAVENOUS | Status: DC | PRN

## 2012-03-03 MED ORDER — ONDANSETRON HCL 4 MG/2ML IJ SOLN
4.0000 mg | Freq: Four times a day (QID) | INTRAMUSCULAR | Status: DC | PRN
  Administered 2012-03-03: 4 mg via INTRAVENOUS
  Filled 2012-03-03: qty 2

## 2012-03-03 MED ORDER — POTASSIUM CHLORIDE CRYS ER 20 MEQ PO TBCR: 20.0000 meq | EXTENDED_RELEASE_TABLET | Freq: Once | ORAL | Status: AC | PRN

## 2012-03-03 MED ORDER — HEPARIN SODIUM (PORCINE) 1000 UNIT/ML IJ SOLN
INTRAMUSCULAR | Status: DC | PRN
  Administered 2012-03-03: 6000 [IU] via INTRAVENOUS

## 2012-03-03 MED ORDER — DEXTROSE-NACL 5-0.9 % IV SOLN
INTRAVENOUS | Status: DC
  Administered 2012-03-03: 08:00:00 via INTRAVENOUS

## 2012-03-03 MED ORDER — DILTIAZEM HCL ER COATED BEADS 180 MG PO CP24
180.0000 mg | ORAL_CAPSULE | Freq: Every day | ORAL | Status: DC
  Filled 2012-03-03: qty 1

## 2012-03-03 MED ORDER — SUCCINYLCHOLINE CHLORIDE 20 MG/ML IJ SOLN
INTRAMUSCULAR | Status: DC | PRN
  Administered 2012-03-03: 100 mg via INTRAVENOUS

## 2012-03-03 MED ORDER — VANCOMYCIN HCL IN DEXTROSE 1-5 GM/200ML-% IV SOLN
1000.0000 mg | Freq: Two times a day (BID) | INTRAVENOUS | Status: DC
  Filled 2012-03-03: qty 200

## 2012-03-03 MED ORDER — FENTANYL CITRATE 0.05 MG/ML IJ SOLN
INTRAMUSCULAR | Status: DC | PRN
  Administered 2012-03-03: 50 ug via INTRAVENOUS
  Administered 2012-03-03: 25 ug via INTRAVENOUS
  Administered 2012-03-03: 150 ug via INTRAVENOUS
  Administered 2012-03-03: 25 ug via INTRAVENOUS

## 2012-03-03 MED ORDER — METOPROLOL SUCCINATE ER 25 MG PO TB24
25.0000 mg | ORAL_TABLET | Freq: Every day | ORAL | Status: DC
  Filled 2012-03-03: qty 1

## 2012-03-03 MED ORDER — LACTATED RINGERS IV SOLN
INTRAVENOUS | Status: DC | PRN
  Administered 2012-03-03 (×3): via INTRAVENOUS

## 2012-03-03 MED ORDER — SODIUM CHLORIDE 0.9 % IV SOLN
10.0000 mg | INTRAVENOUS | Status: DC | PRN
  Administered 2012-03-03: 25 ug/min via INTRAVENOUS

## 2012-03-03 MED ORDER — GUAIFENESIN-DM 100-10 MG/5ML PO SYRP: 15.0000 mL | ORAL_SOLUTION | ORAL | Status: DC | PRN

## 2012-03-03 MED ORDER — DILTIAZEM HCL ER COATED BEADS 180 MG PO CP24
180.0000 mg | ORAL_CAPSULE | Freq: Every day | ORAL | Status: DC
  Administered 2012-03-03: 180 mg via ORAL
  Filled 2012-03-03: qty 1

## 2012-03-03 MED ORDER — HEPARIN (PORCINE) IN NACL 100-0.45 UNIT/ML-% IJ SOLN
15.0000 [IU]/kg/h | INTRAMUSCULAR | Status: DC
  Administered 2012-03-03: 15 [IU]/kg/h via INTRAVENOUS
  Filled 2012-03-03: qty 250

## 2012-03-03 MED ORDER — SIMVASTATIN 20 MG PO TABS
20.0000 mg | ORAL_TABLET | Freq: Every day | ORAL | Status: DC
  Filled 2012-03-03: qty 1

## 2012-03-03 MED ORDER — GLYCOPYRROLATE 0.2 MG/ML IJ SOLN
INTRAMUSCULAR | Status: DC | PRN
  Administered 2012-03-03: .8 mg via INTRAVENOUS

## 2012-03-03 MED ORDER — LACTATED RINGERS IV SOLN
INTRAVENOUS | Status: DC | PRN
  Administered 2012-03-03: 14:00:00 via INTRAVENOUS

## 2012-03-03 MED ORDER — ONDANSETRON HCL 4 MG PO TABS: 4.0000 mg | ORAL_TABLET | Freq: Four times a day (QID) | ORAL | Status: DC | PRN

## 2012-03-03 MED ORDER — ONDANSETRON HCL 4 MG/2ML IJ SOLN: 4.0000 mg | Freq: Once | INTRAMUSCULAR | Status: DC | PRN

## 2012-03-03 MED ORDER — ONDANSETRON HCL 4 MG/2ML IJ SOLN
4.0000 mg | Freq: Once | INTRAMUSCULAR | Status: AC
  Administered 2012-03-03: 4 mg via INTRAVENOUS
  Filled 2012-03-03: qty 2

## 2012-03-03 MED ORDER — MORPHINE SULFATE 2 MG/ML IJ SOLN
2.0000 mg | Freq: Once | INTRAMUSCULAR | Status: AC
  Administered 2012-03-03: 2 mg via INTRAVENOUS
  Filled 2012-03-03 (×2): qty 1

## 2012-03-03 MED ORDER — MIDAZOLAM HCL 5 MG/5ML IJ SOLN
INTRAMUSCULAR | Status: DC | PRN
  Administered 2012-03-03 (×2): 1 mg via INTRAVENOUS

## 2012-03-03 MED ORDER — ASPIRIN EC 81 MG PO TBEC
81.0000 mg | DELAYED_RELEASE_TABLET | Freq: Every day | ORAL | Status: DC
  Administered 2012-03-03: 81 mg via ORAL
  Filled 2012-03-03: qty 1

## 2012-03-03 MED ORDER — ROCURONIUM BROMIDE 100 MG/10ML IV SOLN
INTRAVENOUS | Status: DC | PRN
  Administered 2012-03-03: 50 mg via INTRAVENOUS

## 2012-03-03 MED ORDER — 0.9 % SODIUM CHLORIDE (POUR BTL) OPTIME
TOPICAL | Status: DC | PRN
  Administered 2012-03-03: 1000 mL

## 2012-03-03 MED ORDER — ONDANSETRON HCL 4 MG/2ML IJ SOLN: 4.0000 mg | Freq: Three times a day (TID) | INTRAMUSCULAR | Status: DC | PRN

## 2012-03-03 MED ORDER — OXYCODONE HCL 5 MG PO TABS: 5.0000 mg | ORAL_TABLET | Freq: Four times a day (QID) | ORAL | Status: DC | PRN

## 2012-03-03 MED ORDER — NEOSTIGMINE METHYLSULFATE 1 MG/ML IJ SOLN
INTRAMUSCULAR | Status: DC | PRN
  Administered 2012-03-03: 5 mg via INTRAVENOUS

## 2012-03-03 MED ORDER — ACETAMINOPHEN 10 MG/ML IV SOLN
INTRAVENOUS | Status: DC | PRN
  Administered 2012-03-03: 1000 mg via INTRAVENOUS

## 2012-03-03 MED ORDER — HEPARIN (PORCINE) IN NACL 100-0.45 UNIT/ML-% IJ SOLN
1050.0000 [IU]/h | INTRAMUSCULAR | Status: DC
  Administered 2012-03-03: 750 [IU]/h via INTRAVENOUS
  Administered 2012-03-05: 900 [IU]/h via INTRAVENOUS
  Administered 2012-03-06: 1050 [IU]/h via INTRAVENOUS
  Filled 2012-03-03 (×4): qty 250

## 2012-03-03 MED ORDER — PHENOL 1.4 % MT LIQD
1.0000 | OROMUCOSAL | Status: DC | PRN
  Filled 2012-03-03: qty 177

## 2012-03-03 MED ORDER — LIDOCAINE HCL (CARDIAC) 20 MG/ML IV SOLN
INTRAVENOUS | Status: DC | PRN
  Administered 2012-03-03: 100 mg via INTRAVENOUS

## 2012-03-03 MED ORDER — PANTOPRAZOLE SODIUM 40 MG PO TBEC
40.0000 mg | DELAYED_RELEASE_TABLET | Freq: Every day | ORAL | Status: DC
  Administered 2012-03-03: 40 mg via ORAL
  Filled 2012-03-03: qty 1

## 2012-03-03 MED ORDER — IOHEXOL 300 MG/ML  SOLN
100.0000 mL | Freq: Once | INTRAMUSCULAR | Status: AC | PRN
  Administered 2012-03-03: 100 mL via INTRAVENOUS

## 2012-03-03 MED ORDER — METOPROLOL TARTRATE 1 MG/ML IV SOLN: 2.0000 mg | INTRAVENOUS | Status: DC | PRN

## 2012-03-03 MED ORDER — HYDROMORPHONE HCL PF 1 MG/ML IJ SOLN
0.2500 mg | INTRAMUSCULAR | Status: DC | PRN
  Administered 2012-03-03 (×2): 0.25 mg via INTRAVENOUS
  Administered 2012-03-03: 0.5 mg via INTRAVENOUS

## 2012-03-03 MED ORDER — ONDANSETRON HCL 4 MG/2ML IJ SOLN: 4.0000 mg | Freq: Four times a day (QID) | INTRAMUSCULAR | Status: DC | PRN

## 2012-03-03 MED ORDER — ACETAMINOPHEN 10 MG/ML IV SOLN
INTRAVENOUS | Status: AC
  Filled 2012-03-03: qty 100

## 2012-03-03 MED ORDER — DOCUSATE SODIUM 100 MG PO CAPS
100.0000 mg | ORAL_CAPSULE | Freq: Every day | ORAL | Status: DC
  Filled 2012-03-03: qty 1

## 2012-03-03 MED ORDER — ONDANSETRON HCL 4 MG/2ML IJ SOLN
INTRAMUSCULAR | Status: DC | PRN
  Administered 2012-03-03: 4 mg via INTRAVENOUS

## 2012-03-03 MED ORDER — OCUVITE-LUTEIN PO CAPS
1.0000 | ORAL_CAPSULE | Freq: Every day | ORAL | Status: DC
  Filled 2012-03-03: qty 1

## 2012-03-03 MED ORDER — HYDRALAZINE HCL 20 MG/ML IJ SOLN: 10.0000 mg | INTRAMUSCULAR | Status: DC | PRN

## 2012-03-03 MED ORDER — SODIUM CHLORIDE 0.9 % IR SOLN
Status: DC | PRN
  Administered 2012-03-03: 15:00:00

## 2012-03-03 SURGICAL SUPPLY — 61 items
CANISTER SUCTION 2500CC (MISCELLANEOUS) ×3 IMPLANT
CANNULA VESSEL W/WING WO/VALVE (CANNULA) ×3 IMPLANT
CATH EMB 3FR 40CM (CATHETERS) ×3 IMPLANT
CLIP TI MEDIUM 24 (CLIP) ×3 IMPLANT
CLIP TI WIDE RED SMALL 24 (CLIP) ×3 IMPLANT
CLOTH BEACON ORANGE TIMEOUT ST (SAFETY) ×3 IMPLANT
COVER SURGICAL LIGHT HANDLE (MISCELLANEOUS) ×6 IMPLANT
DERMABOND ADVANCED (GAUZE/BANDAGES/DRESSINGS) ×2
DERMABOND ADVANCED .7 DNX12 (GAUZE/BANDAGES/DRESSINGS) ×4 IMPLANT
DRAPE WARM FLUID 44X44 (DRAPE) ×3 IMPLANT
ELECT BLADE 4.0 EZ CLEAN MEGAD (MISCELLANEOUS) ×3
ELECT BLADE 6.5 EXT (BLADE) IMPLANT
ELECT REM PT RETURN 9FT ADLT (ELECTROSURGICAL) ×3
ELECTRODE BLDE 4.0 EZ CLN MEGD (MISCELLANEOUS) ×2 IMPLANT
ELECTRODE REM PT RTRN 9FT ADLT (ELECTROSURGICAL) ×2 IMPLANT
GLOVE BIO SURGEON STRL SZ 6.5 (GLOVE) ×6 IMPLANT
GLOVE BIO SURGEON STRL SZ7.5 (GLOVE) ×3 IMPLANT
GLOVE BIOGEL PI IND STRL 7.0 (GLOVE) ×6 IMPLANT
GLOVE BIOGEL PI IND STRL 7.5 (GLOVE) ×6 IMPLANT
GLOVE BIOGEL PI INDICATOR 7.0 (GLOVE) ×3
GLOVE BIOGEL PI INDICATOR 7.5 (GLOVE) ×3
GLOVE SURG SS PI 7.5 STRL IVOR (GLOVE) ×6 IMPLANT
GOWN PREVENTION PLUS XLARGE (GOWN DISPOSABLE) ×6 IMPLANT
GOWN STRL NON-REIN LRG LVL3 (GOWN DISPOSABLE) ×12 IMPLANT
INSERT FOGARTY 61MM (MISCELLANEOUS) ×3 IMPLANT
INSERT FOGARTY SM (MISCELLANEOUS) ×6 IMPLANT
KIT BASIN OR (CUSTOM PROCEDURE TRAY) ×3 IMPLANT
KIT ROOM TURNOVER OR (KITS) ×3 IMPLANT
LOOP VESSEL MINI RED (MISCELLANEOUS) ×3 IMPLANT
NS IRRIG 1000ML POUR BTL (IV SOLUTION) ×6 IMPLANT
PACK AORTA (CUSTOM PROCEDURE TRAY) ×3 IMPLANT
PAD ARMBOARD 7.5X6 YLW CONV (MISCELLANEOUS) ×6 IMPLANT
PATCH VASCULAR VASCU GUARD 1X6 (Vascular Products) ×3 IMPLANT
RETAINER VISCERA MED (MISCELLANEOUS) ×3 IMPLANT
SPECIMEN JAR MEDIUM (MISCELLANEOUS) ×3 IMPLANT
SPONGE SURGIFOAM ABS GEL 100 (HEMOSTASIS) IMPLANT
STAPLER VISISTAT 35W (STAPLE) ×3 IMPLANT
SUT ETHIBOND 5 LR DA (SUTURE) IMPLANT
SUT PDS AB 1 TP1 54 (SUTURE) ×6 IMPLANT
SUT PROLENE 2 0 MH 48 (SUTURE) IMPLANT
SUT PROLENE 3 0 SH 48 (SUTURE) IMPLANT
SUT PROLENE 3 0 SH1 36 (SUTURE) IMPLANT
SUT PROLENE 5 0 C 1 24 (SUTURE) ×3 IMPLANT
SUT PROLENE 5 0 C 1 36 (SUTURE) IMPLANT
SUT PROLENE 6 0 BV (SUTURE) ×9 IMPLANT
SUT SILK 2 0 (SUTURE) ×1
SUT SILK 2 0 SH CR/8 (SUTURE) IMPLANT
SUT SILK 2-0 18XBRD TIE 12 (SUTURE) ×2 IMPLANT
SUT SILK 3 0 (SUTURE) ×1
SUT SILK 3-0 18XBRD TIE 12 (SUTURE) ×2 IMPLANT
SUT VIC AB 2-0 CTB1 (SUTURE) ×6 IMPLANT
SUT VIC AB 3-0 SH 27 (SUTURE) ×4
SUT VIC AB 3-0 SH 27X BRD (SUTURE) ×8 IMPLANT
SUT VIC AB 3-0 X1 27 (SUTURE) ×3 IMPLANT
SUT VICRYL 4-0 PS2 18IN ABS (SUTURE) ×6 IMPLANT
SYR TB 1ML 26GX3/8 SAFETY (SYRINGE) ×3 IMPLANT
TOWEL BLUE STERILE X RAY DET (MISCELLANEOUS) ×6 IMPLANT
TOWEL OR 17X24 6PK STRL BLUE (TOWEL DISPOSABLE) ×6 IMPLANT
TOWEL OR 17X26 10 PK STRL BLUE (TOWEL DISPOSABLE) ×6 IMPLANT
TRAY FOLEY CATH 14FRSI W/METER (CATHETERS) ×3 IMPLANT
WATER STERILE IRR 1000ML POUR (IV SOLUTION) ×6 IMPLANT

## 2012-03-03 NOTE — Progress Notes (Signed)
03/03/12 2100  Pt has a hx of A fib and is currently in A fib. HR goes up to 140-150s but does not sustain. Claiborne Billings, NP with [facility] notified. No new orders received at the moment. Will continue to monitor.  [place] Daniel Ritthaler, RN

## 2012-03-03 NOTE — H&P (Signed)
[facility] History and Physical  CHARO PHILIPP UJW:[id number] DOB: 04/06/1928 DOA: 03/02/2012   PCP:   Michele Mcalpine, MD   Chief Complaint: Acute abdominal pain   HPI: Lisa Douglas is an 78 y.o. female with history of chronic atrial fibrillation, not on full anticoagulation due to AVM, anxiety, GERD, hyperlipidemia, presents to the emergency room complaining of acute onset of abdominal pain for 2 days, accompanied with nausea and vomiting along with having watery diarrhea. She denied any recent antibiotic. She has no black stool bloody stool. She denied any fever or chills. Evaluation in emergency room included an abdominal pelvic CT which showed a superior mesenteric artery non-occlusive thrombus, but no evidence of his ischemic bowel, along with incidental finding of a small pulmonary nodule and calcification in the aorta. She has a white count of 13,000, hemoglobin of 15 g per DL. She has normal renal function tests. Hospitalist was asked to admit patient for further evaluation and treatment.  Rewiew of Systems:  The patient denies anorexia, fever, weight loss,, vision loss, decreased hearing, hoarseness, chest pain, syncope, dyspnea on exertion, peripheral edema, balance deficits, hemoptysis, , melena, hematochezia, severe indigestion/heartburn, hematuria, incontinence, genital sores, muscle weakness, suspicious skin lesions, transient blindness, difficulty walking, depression, unusual weight change, abnormal bleeding, enlarged lymph nodes, angioedema, and breast masses.    Past Medical History  Diagnosis Date  . Macular degeneration (senile) of retina, unspecified   . Atrial fibrillation   . Unspecified venous (peripheral) insufficiency   . Other and unspecified hyperlipidemia   . Esophageal reflux   . Diverticulosis of colon (without mention of hemorrhage)   . Angiodysplasia of intestine (without mention of hemorrhage)   . Osteoarthrosis, unspecified whether generalized or  localized, unspecified site   . Osteoporosis, unspecified   . Anxiety state, unspecified     Past Surgical History  Procedure Date  . Appendectomy 1951  . Tonsillectomy 1943    Medications:  HOME MEDS: Prior to Admission medications   Medication Sig Start Date End Date Taking? Authorizing Provider  ALPRAZolam Prudy Feeler) 0.5 MG tablet Take 0.25 mg by mouth 3 (three) times daily as needed. For anxiety   Yes Historical Provider, MD  aspirin 81 MG tablet Take 162 mg by mouth daily. Take 2 tablets  By mouth once daily   Yes Historical Provider, MD  Calcium Carbonate-Vitamin D (CALTRATE 600+D) 600-400 MG-UNIT per tablet Take 1 tablet by mouth 2 (two) times daily.     Yes Historical Provider, MD  cholecalciferol (VITAMIN D) 1000 UNITS tablet Take 2,000 Units by mouth daily.    Yes Historical Provider, MD  diltiazem (CARDIZEM CD) 180 MG 24 hr capsule TAKE ONE CAPSULE BY MOUTH EVERY DAY. 12/08/11  Yes Dolores Patty, MD  metoprolol succinate (TOPROL-XL) 25 MG 24 hr tablet TAKE ONE TABLET BY MOUTH EVERY DAY 12/24/11  Yes Dolores Patty, MD  Multiple Vitamins-Minerals (CENTRUM SILVER PO) Take 1 tablet by mouth daily.     Yes Historical Provider, MD  Multiple Vitamins-Minerals (EYE VITAMINS) TABS Take 1 tablet by mouth daily.     Yes Historical Provider, MD  omeprazole (PRILOSEC OTC) 20 MG tablet Take 20 mg by mouth daily.     Yes Historical Provider, MD  simvastatin (ZOCOR) 20 MG tablet TAKE ONE TABLET BY MOUTH AT BEDTIME 08/18/11  Yes Michele Mcalpine, MD     Allergies:  Allergies  Allergen Reactions  . Penicillins     angioedema  . Alendronate Sodium  REACTION: INTOL to Fosamax per pt.  . Atorvastatin     REACTION: INTOL to Lipitor per pt.    Social History:   reports that she quit smoking about 60 years ago. Her smoking use included Cigarettes. She does not have any smokeless tobacco history on file. She reports that she does not drink alcohol or use illicit drugs.  Family  History: Family History  Problem Relation Age of Onset  . Heart disease Sister   . Breast cancer Sister   . Lymphoma Sister   . Hemochromatosis Father      Physical Exam: Filed Vitals:   03/03/12 0400 03/03/12 0430 03/03/12 0450 03/03/12 0600  BP: 129/66 119/91 110/42 125/80  Pulse: 123 90 112 119  Temp:   97.4 F (36.3 C) 97.8 F (36.6 C)  TempSrc:   Oral Oral  Resp:  20 19 18   Height:    5\' 5"  (1.651 m)  Weight:    68.8 kg (151 lb 10.8 oz)  SpO2: 91% 97% 100% 94%   Blood pressure 125/80, pulse 119, temperature 97.8 F (36.6 C), temperature source Oral, resp. rate 18, height 5\' 5"  (1.651 m), weight 68.8 kg (151 lb 10.8 oz), SpO2 94.00%.  GEN:  Pleasant  person lying in the stretcher in no acute distress; cooperative with exam PSYCH:  alert and oriented x4; does not appear anxious or depressed; affect is appropriate. HEENT: Mucous membranes pink and anicteric; PERRLA; EOM intact; no cervical lymphadenopathy nor thyromegaly or carotid bruit; no JVD; Breasts:: Not examined CHEST WALL: No tenderness CHEST: Normal respiration, clear to auscultation bilaterally HEART: Regular rate and rhythm; soft murmur 2/6 with no rubs or gallops BACK: No kyphosis or scoliosis; no CVA tenderness ABDOMEN: Obese, soft non-tender; no masses, no organomegaly, normal abdominal bowel sounds; no pannus; no intertriginous candida. Rectal Exam: Not done EXTREMITIES: No bone or joint deformity; age-appropriate arthropathy of the hands and knees; no edema; no ulcerations. Genitalia: not examined PULSES: 2+ and symmetric SKIN: Normal hydration no rash or ulceration CNS: Cranial nerves 2-12 grossly intact no focal lateralizing neurologic deficit   Labs on Admission:  Basic Metabolic Panel:  Lab 03/02/12 2841  NA 136  K 4.1  CL 100  CO2 23  GLUCOSE 141*  BUN 21  CREATININE 0.85  CALCIUM 9.6  MG --  PHOS --   Liver Function Tests:  Lab 03/02/12 2340  AST 33  ALT 19  ALKPHOS 71  BILITOT  0.6  PROT 7.5  ALBUMIN 4.2    Lab 03/02/12 2340  LIPASE 25  AMYLASE --   No results found for this basename: AMMONIA:5 in the last 168 hours CBC:  Lab 03/02/12 2340  WBC 13.8*  NEUTROABS 12.1*  HGB 15.5*  HCT 45.6  MCV 94.4  PLT 225   Cardiac Enzymes:  Lab 03/02/12 2340  CKTOTAL --  CKMB --  CKMBINDEX --  TROPONINI <0.30   BNP: No components found with this basename: POCBNP:5 CBG: No results found for this basename: GLUCAP:5 in the last 168 hours  Radiological Exams on Admission: Ct Abdomen Pelvis W Contrast  03/03/2012  *RADIOLOGY REPORT*  Clinical Data: Abdominal pain, nausea, vomiting and diarrhea after eating; leukocytosis.  CT ABDOMEN AND PELVIS WITH CONTRAST  Technique:  Multidetector CT imaging of the abdomen and pelvis was performed following the standard protocol during bolus administration of intravenous contrast.  Contrast: OMNIPAQUE IOHEXOL 300 MG/ML  SOLN  Comparison: None.  Findings: Minimal bibasilar atelectasis is noted.  A 7 mm  pleural based nodule is noted at the left lung base.  Numerous scattered hypodensities are noted within the liver.  These most likely reflect cysts, given their appearance and multiplicity. The liver is otherwise grossly unremarkable in appearance.  The spleen is within normal limits.  The pancreas and adrenal glands are unremarkable.  The triangular appearance of the right adrenal gland is thought to be developmental in nature.  The gallbladder is mildly decompressed and grossly unremarkable in appearance.  Scattered bilateral renal cysts are noted, with a dominant left renal parapelvic cyst measuring 3.6 cm in size.  The kidneys are otherwise unremarkable in appearance.  There is no evidence of hydronephrosis.  No renal or ureteral stones are seen.  No perinephric stranding is appreciated.  No free fluid is identified.  The small bowel is unremarkable in appearance.  Postoperative change is noted at the stomach.  No acute vascular  abnormalities are seen.  Scattered calcification is noted along the abdominal aorta and its branches, including at the origins of both renal arteries.  There is suggestion of nonocclusive thrombus within the superior mesenteric artery; no associated ischemic bowel is identified on CT at this time.  The patient is status post appendectomy.  The colon is largely decompressed.  Diffuse diverticulosis is noted along the sigmoid colon, without evidence of diverticulitis.  The colon is otherwise unremarkable in appearance.  The bladder is decompressed and not well assessed.  The uterus is grossly unremarkable in appearance.  The ovaries are grossly symmetric; no suspicious adnexal masses are seen.  No inguinal lymphadenopathy is seen.  No acute osseous abnormalities are identified.  IMPRESSION:  1.  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  No associated ischemic bowel identified on CT at this time.  However, this may explain the patient's symptoms. 2.  Diffuse diverticulosis along the sigmoid colon, without evidence of diverticulitis. 3.  Scattered calcification along the abdominal aorta and its branches, including at the origins of both renal arteries. 4.  Scattered bilateral renal cysts and diffuse small hepatic cysts seen. 5.  7 mm pleural based nodule noted at the left lung base.  If the patient is at high risk for bronchogenic carcinoma, follow-up chest CT at 3-6 months is recommended.  If the patient is at low risk for bronchogenic carcinoma, follow-up chest CT at 6-12 months is recommended.  This recommendation follows the consensus statement: Guidelines for Management of Small Pulmonary Nodules Detected on CT Scans: A Statement from the Fleischner Society as published in Radiology 2005; 237:395-400.  These results were called by telephone on 03/03/2012  at  02:58 a.m. to  Renne Crigler, who verbally acknowledged these results.  Original Report  Authenticated By: Tonia Ghent, M.D.       Assessment/Plan Present on Admission:  .Superior mesenteric artery thrombosis .ATRIAL FIBRILLATION, CHRONIC .ANGIODYSPLASIA, COLON .HYPERLIPIDEMIA .Abdominal pain, acute   PLAN:  None occlusive SMA thrombus, acute. She also has history of chronic atrial fibrillation who was not a previous candidate for Coumadin (CHA D. S2 equal to 1), however, will start her on full anticoagulation with heparin. We'll give her IV fluid and pain medication. She will be made n.p.o. We have consulted with GI who will see her today for further recommendation. For her atrial fibrillation, will continue her beta blocker and calcium channel blocker. She will be given intravenous fluid. She is stable, full code, and will be [facility].   Other plans as per orders.  Code Status: FULL CODE  Disposition Plan:  Telemetry admit to [facility].    Rekita Miotke Nocturnist [facility] Pager [phone]  03/03/2012, 6:04 AM

## 2012-03-03 NOTE — Progress Notes (Addendum)
ANTICOAGULATION CONSULT NOTE - Follow Up Consult  Pharmacy Consult for Heparin Indication: SMA embolus s/p successful embolectomy; Hx Afib (not on chronic anticoagulation)  Allergies  Allergen Reactions  . Penicillins     angioedema  . Alendronate Sodium     REACTION: INTOL to Fosamax per pt.  . Atorvastatin     REACTION: INTOL to Lipitor per pt.    Patient Measurements: Height: 5\' 5"  (165.1 cm) Weight: 151 lb 10.8 oz (68.8 kg) IBW/kg (Calculated) : 57  Heparin Dosing Weight: 68.8kg  Vital Signs: Temp: 98 F (36.7 C) (07/12 2000) Temp src: Axillary (07/12 2000) BP: 136/85 mmHg (07/12 2000) Pulse Rate: 112  (07/12 2000)  Labs:  Basename 03/03/12 2132 03/03/12 1750 03/03/12 1235 03/03/12 0351 03/02/12 2340  HGB -- 12.8 -- -- 15.5*  HCT -- 37.7 -- -- 45.6  PLT -- 156 -- -- 225  APTT 101* >200* -- -- --  LABPROT -- 17.2* -- 13.9 --  INR -- 1.38 -- 1.05 --  HEPARINUNFRC -- -- 1.00* -- --  CREATININE -- 0.48* -- -- 0.85  CKTOTAL -- -- -- -- --  CKMB -- -- -- -- --  TROPONINI -- -- -- -- <0.30    Estimated Creatinine Clearance: 53.7 ml/min (by C-G formula based on Cr of 0.48).   Medications:  Prescriptions prior to admission  Medication Sig Dispense Refill  . ALPRAZolam (XANAX) 0.5 MG tablet Take 0.25 mg by mouth 3 (three) times daily as needed. For anxiety      . aspirin 81 MG tablet Take 162 mg by mouth daily. Take 2 tablets  By mouth once daily      . Calcium Carbonate-Vitamin D (CALTRATE 600+D) 600-400 MG-UNIT per tablet Take 1 tablet by mouth 2 (two) times daily.        . cholecalciferol (VITAMIN D) 1000 UNITS tablet Take 2,000 Units by mouth daily.       Marland Kitchen diltiazem (CARDIZEM CD) 180 MG 24 hr capsule TAKE ONE CAPSULE BY MOUTH EVERY DAY.  30 capsule  6  . metoprolol succinate (TOPROL-XL) 25 MG 24 hr tablet TAKE ONE TABLET BY MOUTH EVERY DAY  30 tablet  12  . Multiple Vitamins-Minerals (CENTRUM SILVER PO) Take 1 tablet by mouth daily.        . Multiple  Vitamins-Minerals (EYE VITAMINS) TABS Take 1 tablet by mouth daily.        Marland Kitchen omeprazole (PRILOSEC OTC) 20 MG tablet Take 20 mg by mouth daily.        . simvastatin (ZOCOR) 20 MG tablet TAKE ONE TABLET BY MOUTH AT BEDTIME  30 tablet  5    Assessment: 83yof admitted with SMA embolus to restart heparin s/p embolectomy. Patient also has h/o Afib but has not been on chronic anticoagulation. Per MD orders, restart heparin with no bolus at 2400 tonight. Heparin 900 units/hr was started pre-procedure and resulted in a supratherapeutic 8 hour heparin level (1) - will restart at lower regimen. - aPTT is trending down post-procedure (>200-->101) - H/H and Plts decreased post op - No bleeding reported per RN - Heparin weight: 86.6kg  Goal of Therapy:  Heparin level 0.3-0.7 units/ml Monitor platelets by anticoagulation protocol: Yes   Plan:  1. Restart heparin drip 750 units/hr (7.5 ml/hr) tonight @ 2400 - no bolus 2. Check heparin level 8 hours after heparin initiation (0800) 3. Daily heparin level and CBC 4. Follow-up long-term anticoagulation  5. Pharmacy additional consulted to renally adjust antibiotics - decrease post-op Vancomycin  to 750mg  IV q12h x 2 doses for CrCl ~54 ml/min (pt did not receive pre-op, will give first dose now)  Cleon Dew [phone] 03/03/2012,10:13 PM

## 2012-03-03 NOTE — Op Note (Signed)
NAME: Lisa Douglas   MRN: [id number] DOB: Jan 10, 1928    DATE OF OPERATION: 03/03/2012  PREOP DIAGNOSIS: embolus to superior mesenteric artery  POSTOP DIAGNOSIS: same  PROCEDURE: embolectomy of the superior mesenteric artery with bovine pericardial patch angioplasty of superior mesenteric artery  SURGEON: Di Kindle. Edilia Bo, MD, FACS  ASSIST: Doreatha Massed, PA  ANESTHESIA: Gen.   EBL: minimal  INDICATIONS: Lisa Douglas is a 78 y.o. female who presented with the sudden onset of acute abdominal pain. CT scan showed an SMA embolus. She's taken urgently to the operating room for SMA embolectomy.  FINDINGS: fresh embolus and superior mesenteric artery. No evidence of significant proximal superior mesenteric artery occlusive disease.  TECHNIQUE: The patient was brought to the operating room and received a general anesthetic. The abdomen and groins were prepped and draped in usual sterile fashion. The abdomen was entered through a midline incision. The NG tube was in good position. The transverse colon was reflected superiorly and the small bowel examined. There was no evidence of significant bowel ischemia. Next the superior mesenteric artery could be palpated at the base of the mesentery. The transverse colon had been reflected superiorly and the small bowel mesentery pulled inferiorly. The dissection was carried down to the anterior mesenteric artery. Multiple branches were controlled with vessel loops. The proximal artery was controlled. The patient was then heparinized. Proximal and distal control were obtained. A longitudinal arteriotomy was made in the superior mesenteric artery just above the area of embolus identified on CT scan. The clot was identified. Using a #3 Fogarty catheter the clot was E. Easily embolectomize. I was able to pass the catheter down the superior mesenteric artery without evidence of obstruction and no further clot was retrieved. Catheter was passed proximally and  there was no clot proximally. Excellent inflow was noted. The artery was irrigated with heparinized saline. A bovine pericardial patch was used for patch angioplasty. This was cut the appropriate size and then sewn with continuous 6-0 Prolene suture. Prior to completing the anastomosis the artery was backbled and flushed and the anastomosis completed. It was excellent Doppler flow at the completion. Hemostasis was obtained in the wound. The mesentery was closed with running 3-0 Vicryl. The abdominal contents were returned to the normal position of this bowel was inspected. The bowel appeared well-perfused. D. The fascia was closed with 2 #1 PDS sutures. The subcutaneous tissue was closed with 2 running 3-0 vicryls. The skin was closed with 2 running 4-0 Vicryl. Dermabond was applied. The patient tolerated the procedure well and was transferred to the recovery room in stable condition. All needle and sponge counts were correct.  Waverly Ferrari, MD, FACS [facility]  DATE OF DICTATION:   03/03/2012

## 2012-03-03 NOTE — ED Provider Notes (Signed)
Medical screening examination/treatment/procedure(s) were conducted as a shared visit with non-physician practitioner(s) and myself.  I personally evaluated the patient during the encounter EPigastric pain and mild tenderness, symptoms out of proportion to exam, CT shows thrombus SMA. MED admit, plan heparin per GI with known risk factors for GIB. IR to eval this am. On recheck pain improved with IV narcotics.  Sunnie Nielsen, MD 03/03/12 [phone]

## 2012-03-03 NOTE — Progress Notes (Signed)
Patient ID: Lisa Douglas, female   DOB: 1927/10/08, 78 y.o.   MRN: [id number]   Brief GI NOTE  Dr. Rhea Belton was called by ER during the night. Pt has a nonocclusive SMA thrombus and came in with acute abdominal pain, and vomiting. Er MD was advised to start IV heparin, and ask Vascular surgery to see her. Pt does have hx of AVM's ,however her hgb is normal and SMA occlusion is life threatening. Should she bleed,transfusions as needed. I spoke to Dr. Donna Bernard this am-and relayed same, and asked her to obtain vascular surgery consult. GI will be available

## 2012-03-03 NOTE — Anesthesia Postprocedure Evaluation (Signed)
  Anesthesia Post-op Note  Patient: Lisa Douglas  Procedure(s) Performed: Procedure(s) (LRB): MESENTERIC ARTERY BYPASS (N/A)  Patient Location: PACU  Anesthesia Type: General  Level of Consciousness: awake  Airway and Oxygen Therapy: Patient Spontanous Breathing and Patient connected to nasal cannula oxygen  Post-op Pain: mild  Post-op Assessment: Post-op Vital signs reviewed, Patient's Cardiovascular Status Stable, Respiratory Function Stable, Patent Airway and No signs of Nausea or vomiting  Post-op Vital Signs: Reviewed and stable  Complications: No apparent anesthesia complications

## 2012-03-03 NOTE — Progress Notes (Signed)
I have seen and examined patient admitted this a.m. by Dr. Houston Siren with past medical history significant for chronic atrial fibrillation not on full anticoagulation to AVMs, venous insufficiency, GERD and diverticulosis presented to the ED with acute onset severe abdominal pain associated with nausea vomiting and watery diarrhea and a CT scan revealed a superior mesenteric artery nonocclusive thrombus but no evidence of ischemic bowel identified. She was started on IV heparin in ED for anticoagulation. I consulted vascular surgery as per GI recommendations, and I discussed outpatient with Dr. Edilia Bo who recommends that the patient be transferred to the preop area at [facility] for further evaluation and management of the thrombus as appropriate. He recommends for the patient to be kept on the hospital service at the [facility] following transfer. Patient reports some nausea this a.m. but has not had any further abdominal pain or vomiting. She denies chest pain. She is afebrile and vital signs stable, and on abdominal exam with bowel sounds are present, soft, nontender nondistended. I have reviewed her CT scan and laboratory data with her family present as well as above recommendations and she is agreeable for [facility] treatment as appropriate. I discussed patient with Dr. Reddy[facility] will be assuming the patient's care at [facility].  Donnalee Curry MD [facility] [phone]

## 2012-03-03 NOTE — Progress Notes (Signed)
ANTICOAGULATION CONSULT NOTE - Initial Consult  Pharmacy Consult for heparin Indication: DVT, non-occlusive SMA thrombus   Allergies  Allergen Reactions  . Penicillins     angioedema  . Alendronate Sodium     REACTION: INTOL to Fosamax per pt.  . Atorvastatin     REACTION: INTOL to Lipitor per pt.    Patient Measurements: Height: 5\' 5"  (165.1 cm) Weight: 150 lb (68.04 kg) IBW/kg (Calculated) : 57  Heparin Dosing Weight:   Vital Signs: Temp: 97.4 F (36.3 C) (07/11 2231) Temp src: Oral (07/11 2231) BP: 108/56 mmHg (07/12 0215) Pulse Rate: 106  (07/12 0215)  Labs:  Basename 03/02/12 2340  HGB 15.5*  HCT 45.6  PLT 225  APTT --  LABPROT --  INR --  HEPARINUNFRC --  CREATININE 0.85  CKTOTAL --  CKMB --  TROPONINI <0.30    Estimated Creatinine Clearance: 46.7 ml/min (by C-G formula based on Cr of 0.85).   Medical History: Past Medical History  Diagnosis Date  . Macular degeneration (senile) of retina, unspecified   . Atrial fibrillation   . Unspecified venous (peripheral) insufficiency   . Other and unspecified hyperlipidemia   . Esophageal reflux   . Diverticulosis of colon (without mention of hemorrhage)   . Angiodysplasia of intestine (without mention of hemorrhage)   . Osteoarthrosis, unspecified whether generalized or localized, unspecified site   . Osteoporosis, unspecified   . Anxiety state, unspecified     Medications:  Infusions:    . heparin      Assessment: Patient with non-occlusive SMA thrombus.    Goal of Therapy:  Heparin level 0.3-0.7 units/ml Monitor platelets by anticoagulation protocol: Yes   Plan:  Heparin bolus 2000 units iv x1, then 900 units/hr drip. Daily CBC/heparin level, next heparin level at 1200.  Darlina Guys, Jacquenette Shone Crowford 03/03/2012,3:59 AM

## 2012-03-03 NOTE — Progress Notes (Signed)
I discussed our recommendations with Mike Gip, PAC and I agree with her note.

## 2012-03-03 NOTE — ED Notes (Signed)
Attempted to call report. Receiving RN Kenney Houseman busy.

## 2012-03-03 NOTE — Anesthesia Preprocedure Evaluation (Addendum)
Anesthesia Evaluation  Patient identified by MRN, date of birth, ID band Patient awake    Reviewed: Allergy & Precautions, H&P , NPO status , Patient's Chart, lab work & pertinent test results  Airway Mallampati: II TM Distance: <3 FB Neck ROM: Limited    Dental  (+) Teeth Intact and Dental Advisory Given   Pulmonary    Pulmonary exam normal       Cardiovascular Exercise Tolerance: Poor + dysrhythmias Atrial Fibrillation Rhythm:Irregular Rate:Normal     Neuro/Psych    GI/Hepatic Neg liver ROS, GERD-  Controlled and Medicated,Nausea w/ Vomiting this early am.  NPO greater than 24 hrs   Endo/Other  negative endocrine ROS  Renal/GU negative Renal ROS     Musculoskeletal negative musculoskeletal ROS (+)   Abdominal (+)  Abdomen: tender.    Peds  Hematology negative hematology ROS (+)   Anesthesia Other Findings   Reproductive/Obstetrics negative OB ROS                          Anesthesia Physical Anesthesia Plan  ASA: III and Emergent  Anesthesia Plan: General   Post-op Pain Management:    Induction: Intravenous, Rapid sequence and Cricoid pressure planned  Airway Management Planned: Oral ETT  Additional Equipment: Arterial line  Intra-op Plan:   Post-operative Plan: Extubation in OR  Informed Consent: I have reviewed the patients History and Physical, chart, labs and discussed the procedure including the risks, benefits and alternatives for the proposed anesthesia with the patient or authorized representative who has indicated his/her understanding and acceptance.     Plan Discussed with: CRNA and Surgeon  Anesthesia Plan Comments:        Anesthesia Quick Evaluation

## 2012-03-03 NOTE — Anesthesia Procedure Notes (Signed)
Procedure Name: Intubation Date/Time: 03/03/2012 2:30 PM Performed by: Tyrone Nine Pre-anesthesia Checklist: Patient identified, Timeout performed, Emergency Drugs available, Suction available and Patient being monitored Patient Re-evaluated:Patient Re-evaluated prior to inductionOxygen Delivery Method: Circle system utilized Preoxygenation: Pre-oxygenation with 100% oxygen Intubation Type: IV induction Grade View: Grade II Tube type: Oral Number of attempts: 1 Airway Equipment and Method: Stylet Placement Confirmation: ETT inserted through vocal cords under direct vision and breath sounds checked- equal and bilateral Secured at: 22 cm Tube secured with: Tape Dental Injury: Teeth and Oropharynx as per pre-operative assessment

## 2012-03-03 NOTE — Transfer of Care (Signed)
Immediate Anesthesia Transfer of Care Note  Patient: Lisa Douglas  Procedure(s) Performed: Procedure(s) (LRB): MESENTERIC ARTERY BYPASS (N/A)  Patient Location: PACU  Anesthesia Type: General  Level of Consciousness: sedated and patient cooperative  Airway & Oxygen Therapy: Patient connected to face mask oxygen  Post-op Assessment: Report given to PACU RN, Post -op Vital signs reviewed and stable and Patient moving all extremities X 4  Post vital signs: Reviewed and stable  Complications: No apparent anesthesia complications

## 2012-03-03 NOTE — ED Notes (Signed)
Report given to [facility], Charity fundraiser. No S&H orders. Will transport pt when S&H are ready.

## 2012-03-03 NOTE — Consult Note (Signed)
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  HPI: Lisa Douglas is a 78 y.o. female developed the acute onset of abdominal pain . She had nausea and vomiting associated with her acute onset abdominal pain. She also reports some diarrhea associated with her symptoms which occurred after her abdominal pain. She presented to the emergency department. She has a history of atrial fibrillation but was not on anti-coagulation presumably because she has a history of angiodysplasia of the intestine. She had an abdominal CT ordered in the emergency department which demonstrated a superior mesenteric artery thrombus but no evidence of ischemic bowel. Vascular surgery was consulted.   Prior to her sudden onset of abdominal pain, she denied any history of abdominal pain specifically postprandial abdominal pain. She's had no history of weight loss. She states that her pain in her abdomen and persisted until she presented to [facility] where she received pain medicine and medicine for nausea which improved her symptoms.  Past Medical History  Diagnosis Date  . Macular degeneration (senile) of retina, unspecified   . Atrial fibrillation   . Unspecified venous (peripheral) insufficiency   . Other and unspecified hyperlipidemia   . Esophageal reflux   . Diverticulosis of colon (without mention of hemorrhage)   . Angiodysplasia of intestine (without mention of hemorrhage)   . Osteoarthrosis, unspecified whether generalized or localized, unspecified site   . Osteoporosis, unspecified   . Anxiety state, unspecified    She denies any history of myocardial infarction, congestive heart failure, diabetes, or COPD.  Family History  Problem Relation Age of Onset  . Heart disease Sister   . Breast cancer Sister   . Lymphoma Sister   . Hemochromatosis Father     SOCIAL  HISTORY: History  Substance Use Topics  . Smoking status: Former Smoker    Types: Cigarettes    Quit date: 08/24/1951  . Smokeless tobacco: Not on file   Comment: smoked at age 72 for a short time--a few months  . Alcohol Use: No    Allergies  Allergen Reactions  . Penicillins     angioedema  . Alendronate Sodium     REACTION: INTOL to Fosamax per pt.  . Atorvastatin     REACTION: INTOL to Lipitor per pt.    Current Facility-Administered Medications  Medication Dose Route Frequency Provider Last Rate Last Dose  . ALPRAZolam Prudy Feeler) tablet 0.25 mg  0.25 mg Oral TID PRN Houston Siren, MD      . aspirin EC tablet 81 mg  81 mg Oral Daily Houston Siren, MD   81 mg at 03/03/12 0855  . dextrose 5 %-0.9 % sodium chloride infusion   Intravenous Continuous Houston Siren, MD 100 mL/hr at 03/03/12 0737    . diltiazem (CARDIZEM CD) 24 hr capsule 180 mg  180 mg Oral Daily Houston Siren, MD   180 mg at 03/03/12 0857  . heparin ADULT infusion 100 units/mL (25000 units/250 mL)  15 Units/kg/hr (Adjusted) Intravenous Continuous Sunnie Nielsen, MD 9 mL/hr at 03/03/12 0423 15 Units/kg/hr at 03/03/12 0423  . heparin bolus via infusion 2,000 Units  2,000 Units Intravenous Once Sunnie Nielsen, MD   2,000 Units at 03/03/12 0423  . HYDROmorphone (DILAUDID) injection 1 mg  1 mg Intravenous Q2H PRN Houston Siren, MD      . iohexol (OMNIPAQUE) 300 MG/ML solution 100 mL  100 mL Intravenous  Once PRN Medication Radiologist, MD   100 mL at 03/03/12 0228  . metoprolol succinate (TOPROL-XL) 24 hr tablet 25 mg  25 mg Oral Daily Houston Siren, MD      . morphine 2 MG/ML injection 2 mg  2 mg Intravenous Once Renne Crigler, PA   2 mg at 03/02/12 2340  . morphine 2 MG/ML injection 2 mg  2 mg Intravenous Once Sunnie Nielsen, MD   2 mg at 03/03/12 0416  . multivitamin-lutein (OCUVITE-LUTEIN) capsule 1 capsule  1 capsule Oral Daily Houston Siren, MD   1 capsule at 03/03/12 0856  . ondansetron (ZOFRAN) injection 4 mg  4 mg Intravenous Once Renne Crigler, PA   4 mg at  03/02/12 2340  . ondansetron (ZOFRAN) injection 4 mg  4 mg Intravenous Once Renne Crigler, PA   4 mg at 03/03/12 0351  . ondansetron (ZOFRAN) tablet 4 mg  4 mg Oral Q6H PRN Houston Siren, MD       Or  . ondansetron [facility]) injection 4 mg  4 mg Intravenous Q6H PRN Houston Siren, MD   4 mg at 03/03/12 1123  . pantoprazole (PROTONIX) EC tablet 40 mg  40 mg Oral Daily Houston Siren, MD   40 mg at 03/03/12 0855  . simvastatin (ZOCOR) tablet 20 mg  20 mg Oral q1800 Houston Siren, MD      . sodium chloride 0.9 % injection 3 mL  3 mL Intravenous Q12H Houston Siren, MD      . DISCONTD: ondansetron (ZOFRAN) injection 4 mg  4 mg Intravenous Q8H PRN Renne Crigler, PA        REVIEW OF SYSTEMS: Arly.Keller ] denotes positive finding; [  ] denotes negative finding CARDIOVASCULAR:  [ ]  chest pain   [ ]  chest pressure   [ ]  palpitations   [ ]  orthopnea   Arly.Keller ] dyspnea on exertion- mild   [ ]  claudication   [ ]  rest pain   [ ]  DVT   [ ]  phlebitis PULMONARY:   [ ]  productive cough   [ ]  asthma   [ ]  wheezing NEUROLOGIC:   [ ]  weakness  [ ]  paresthesias  [ ]  aphasia  [ ]  amaurosis  [ ]  dizziness HEMATOLOGIC:   [ ]  bleeding problems   [ ]  clotting disorders MUSCULOSKELETAL:  [ ]  joint pain   [ ]  joint swelling [ ]  leg swelling GASTROINTESTINAL: [ ]   blood in stool  [ ]   hematemesis GENITOURINARY:  [ ]   dysuria  [ ]   hematuria PSYCHIATRIC:  [ ]  history of major depression INTEGUMENTARY:  [ ]  rashes  [ ]  ulcers CONSTITUTIONAL:  [ ]  fever   [ ]  chills  PHYSICAL EXAM: Filed Vitals:   03/03/12 0400 03/03/12 0430 03/03/12 0450 03/03/12 0600  BP: 129/66 119/91 110/42 125/80  Pulse: 123 90 112 119  Temp:   97.4 F (36.3 C) 97.8 F (36.6 C)  TempSrc:   Oral Oral  Resp:  20 19 18   Height:    5\' 5"  (1.651 m)  Weight:    151 lb 10.8 oz (68.8 kg)  SpO2: 91% 97% 100% 94%   Body mass index is 25.24 kg/(m^2). GENERAL: The patient is a well-nourished female, in no acute distress. The vital signs are documented above. CARDIOVASCULAR: There is an  irregular rhythm. I do not detect carotid bruits. She has palpable femoral popliteal and pedal pulses. PULMONARY: There is good air exchange bilaterally without wheezing or rales. ABDOMEN: Soft and  non-tender with normal pitched bowel sounds. I do not palpate an aneurysm. I do not hear an abdominal bruit. MUSCULOSKELETAL: There are no major deformities or cyanosis. NEUROLOGIC: No focal weakness or paresthesias are detected. SKIN: There are no ulcers or rashes noted. PSYCHIATRIC: The patient has a normal affect.  DATA:  Lab Results  Component Value Date   WBC 13.8* 03/02/2012   HGB 15.5* 03/02/2012   HCT 45.6 03/02/2012   MCV 94.4 03/02/2012   PLT 225 03/02/2012   Lab Results  Component Value Date   NA 136 03/02/2012   K 4.1 03/02/2012   CL 100 03/02/2012   CO2 23 03/02/2012   Lab Results  Component Value Date   CREATININE 0.85 03/02/2012   Lab Results  Component Value Date   INR 1.05 03/03/2012   CT SCAN OF THE ABDOMEN: There is nonocclusive thrombus within the superior mesenteric artery. There is no clear-cut evidence of ischemic bowel associated with this. There is calcific disease in the proximal superior mesenteric artery however is difficult to assess if there is significant atherosclerotic disease present here. Incidental findings include a 7 mm pleural-based nodule at the left lung base. She also has diffuse diverticulosis along the sigmoid colon without evidence of diverticulitis.  MEDICAL ISSUES: This patient presents with the acute onset of abdominal pain almost certainly related to her superior mesenteric artery embolus. Most likely this is related to her atrial fibrillation. She has not been on anti-coagulation. There is a small chance that it is related to proximal disease of the superior mesenteric artery although I think this is much less likely. She gives no history of previous postprandial abdominal pain or weight loss. There appears to be good flow through the proximal  superior mesenteric artery although the recent calcium in the proximal vessel. I have recommended emergent embolectomy of the superior mesenteric artery. I've explained that if she were found to have significant proximal disease we could potentially have to do bypass. I've explained that if we found ischemic intestine that potentially she could require resection of ischemic intestine. Consideration should be given to long-term postoperative anti-coagulation. We have discussed the procedure potential complications including but not limited to bleeding, wound healing problems, MI or other unpredictable medical problems. All of her questions were answered she is agreeable to proceed.  Trecia Maring S [facility] [place] Beeper: [phone]

## 2012-03-03 NOTE — Preoperative (Signed)
Beta Blockers   Pt took Toprol on        @

## 2012-03-03 NOTE — Progress Notes (Signed)
Call to dr. Edilia Bo to report ptt greater than 200 and k+ 3.2. Not new orders

## 2012-03-04 DIAGNOSIS — I059 Rheumatic mitral valve disease, unspecified: Secondary | ICD-10-CM

## 2012-03-04 LAB — HEPARIN LEVEL (UNFRACTIONATED): Heparin Unfractionated: 0.56 IU/mL (ref 0.30–0.70)

## 2012-03-04 LAB — COMPREHENSIVE METABOLIC PANEL
AST: 28 U/L (ref 0–37)
Albumin: 2.7 g/dL — ABNORMAL LOW (ref 3.5–5.2)
Calcium: 7.7 mg/dL — ABNORMAL LOW (ref 8.4–10.5)
Chloride: 108 mEq/L (ref 96–112)
Creatinine, Ser: 0.51 mg/dL (ref 0.50–1.10)
Total Protein: 5.4 g/dL — ABNORMAL LOW (ref 6.0–8.3)

## 2012-03-04 LAB — AMYLASE: Amylase: 30 U/L (ref 0–105)

## 2012-03-04 LAB — CBC
MCHC: 33 g/dL (ref 30.0–36.0)
Platelets: 173 10*3/uL (ref 150–400)
RDW: 13.4 % (ref 11.5–15.5)
WBC: 11.9 10*3/uL — ABNORMAL HIGH (ref 4.0–10.5)

## 2012-03-04 LAB — MAGNESIUM: Magnesium: 1.6 mg/dL (ref 1.5–2.5)

## 2012-03-04 MED ORDER — SODIUM CHLORIDE 0.9 % IV BOLUS (SEPSIS)
500.0000 mL | Freq: Once | INTRAVENOUS | Status: AC
Start: 2012-03-04 — End: 2012-03-04
  Administered 2012-03-04: 500 mL via INTRAVENOUS

## 2012-03-04 MED ORDER — MORPHINE SULFATE 2 MG/ML IJ SOLN
2.0000 mg | INTRAMUSCULAR | Status: DC | PRN
  Administered 2012-03-04: 2 mg via INTRAVENOUS
  Filled 2012-03-04: qty 1

## 2012-03-04 MED ORDER — METOPROLOL TARTRATE 1 MG/ML IV SOLN
5.0000 mg | Freq: Four times a day (QID) | INTRAVENOUS | Status: DC
  Administered 2012-03-04: 5 mg via INTRAVENOUS
  Filled 2012-03-04: qty 5

## 2012-03-04 MED ORDER — ASPIRIN 300 MG RE SUPP
300.0000 mg | Freq: Every day | RECTAL | Status: DC
  Administered 2012-03-04: 300 mg via RECTAL
  Filled 2012-03-04 (×3): qty 1

## 2012-03-04 MED ORDER — METOPROLOL TARTRATE 1 MG/ML IV SOLN
10.0000 mg | Freq: Four times a day (QID) | INTRAVENOUS | Status: DC
  Administered 2012-03-04 (×2): 10 mg via INTRAVENOUS
  Filled 2012-03-04 (×2): qty 10

## 2012-03-04 MED ORDER — LORAZEPAM 2 MG/ML IJ SOLN: 0.5000 mg | Freq: Four times a day (QID) | INTRAMUSCULAR | Status: DC | PRN

## 2012-03-04 NOTE — Progress Notes (Signed)
ANTICOAGULATION CONSULT NOTE - Follow Up Consult  Pharmacy Consult for Heparin Indication: SMA embolus s/p successful embolectomy; Hx Afib (not on chronic anticoagulation)  Allergies  Allergen Reactions  . Penicillins     angioedema  . Alendronate Sodium     REACTION: INTOL to Fosamax per pt.  . Atorvastatin     REACTION: INTOL to Lipitor per pt.    Patient Measurements: Height: 5\' 5"  (165.1 cm) Weight: 151 lb 10.8 oz (68.8 kg) IBW/kg (Calculated) : 57  Heparin Dosing Weight: 68.8kg  Vital Signs: Temp: 97.8 F (36.6 C) (07/13 1111) Temp src: Oral (07/13 1111) BP: 110/63 mmHg (07/13 1109) Pulse Rate: 75  (07/13 1558)  Labs:  Basename 03/04/12 1500 03/04/12 0800 03/04/12 0426 03/03/12 2132 03/03/12 1750 03/03/12 1235 03/03/12 0351 03/02/12 2340  HGB -- -- 11.6* -- 12.8 -- -- --  HCT -- -- 35.2* -- 37.7 -- -- 45.6  PLT -- -- 173 -- 156 -- -- 225  APTT -- -- -- 101* >200* -- -- --  LABPROT -- -- -- -- 17.2* -- 13.9 --  INR -- -- -- -- 1.38 -- 1.05 --  HEPARINUNFRC 0.56 0.39 -- -- -- 1.00* -- --  CREATININE -- -- 0.51 -- 0.48* -- -- 0.85  CKTOTAL -- -- -- -- -- -- -- --  CKMB -- -- -- -- -- -- -- --  TROPONINI -- -- -- -- -- -- -- <0.30    Estimated Creatinine Clearance: 53.7 ml/min (by C-G formula based on Cr of 0.51).   Medications:  Prescriptions prior to admission  Medication Sig Dispense Refill  . ALPRAZolam (XANAX) 0.5 MG tablet Take 0.25 mg by mouth 3 (three) times daily as needed. For anxiety      . aspirin 81 MG tablet Take 162 mg by mouth daily. Take 2 tablets  By mouth once daily      . Calcium Carbonate-Vitamin D (CALTRATE 600+D) 600-400 MG-UNIT per tablet Take 1 tablet by mouth 2 (two) times daily.        . cholecalciferol (VITAMIN D) 1000 UNITS tablet Take 2,000 Units by mouth daily.       Marland Kitchen diltiazem (CARDIZEM CD) 180 MG 24 hr capsule TAKE ONE CAPSULE BY MOUTH EVERY DAY.  30 capsule  6  . metoprolol succinate (TOPROL-XL) 25 MG 24 hr tablet TAKE ONE  TABLET BY MOUTH EVERY DAY  30 tablet  12  . Multiple Vitamins-Minerals (CENTRUM SILVER PO) Take 1 tablet by mouth daily.        . Multiple Vitamins-Minerals (EYE VITAMINS) TABS Take 1 tablet by mouth daily.        Marland Kitchen omeprazole (PRILOSEC OTC) 20 MG tablet Take 20 mg by mouth daily.        . simvastatin (ZOCOR) 20 MG tablet TAKE ONE TABLET BY MOUTH AT BEDTIME  30 tablet  5    Assessment: abdominal pain --> mesenteric artery thrombus (non occlusive)  83yof admitted with SMA embolus on heparin s/p embolectomy. Patient also has h/o Afib but has not been on chronic anticoagulation. Heparin level re-check remains at goal at 0.56. Hgb down slightly to 11.6. Platelets stable.   ID: Tmax 99. WBC 11.9. Post op Vanco today.  Cards:h/o afib.  VSS. Intermittent tachy noted by nursing. Meds: ASA 81mg , diltiazem, metoprolol, Zocor  Goal of Therapy:  Heparin level 0.3-0.7 units/ml Monitor platelets by anticoagulation protocol: Yes   Plan:  1. Continue heparin 750 units/hr. 2. F/u AM heparin level  Chellsea Beckers, Drake Leach 03/04/2012,4:21  PM

## 2012-03-04 NOTE — Progress Notes (Addendum)
Vascular and Vein Specialists Progress Note  03/04/2012 9:25 AM POD 1  Subjective:  Pt getting echo at this time.  Pt states her abdomen doesn't hurt to be touched, but it hurts to move.  States her pre op symptoms are improved and feels like her pain now is soreness.  C/o sore throat.  Afebrile x 24 hrs  HR 80s-110s  irreg    90s-130s systolic Filed Vitals:   03/04/12 0806  BP: 122/54  Pulse: 95  Temp: 99 F (37.2 C)  Resp: 16     Physical Exam: Cardiac:  irreg Lungs:  Non-labored Abdomen:  Slightly distended; non tender to palpation; denies flatus Incisions:  C/d/i Extremities:  Bilateral palpable PT pulses  CBC    Component Value Date/Time   WBC 11.9* 03/04/2012 0426   RBC 3.70* 03/04/2012 0426   HGB 11.6* 03/04/2012 0426   HCT 35.2* 03/04/2012 0426   PLT 173 03/04/2012 0426   MCV 95.1 03/04/2012 0426   MCH 31.4 03/04/2012 0426   MCHC 33.0 03/04/2012 0426   RDW 13.4 03/04/2012 0426   LYMPHSABS 1.1 03/02/2012 2340   MONOABS 0.6 03/02/2012 2340   EOSABS 0.0 03/02/2012 2340   BASOSABS 0.0 03/02/2012 2340    BMET    Component Value Date/Time   NA 139 03/04/2012 0426   K 3.9 03/04/2012 0426   CL 108 03/04/2012 0426   CO2 23 03/04/2012 0426   GLUCOSE 141* 03/04/2012 0426   BUN 11 03/04/2012 0426   CREATININE 0.51 03/04/2012 0426   CALCIUM 7.7* 03/04/2012 0426   GFRNONAA 88* 03/04/2012 0426   GFRAA >90 03/04/2012 0426    INR    Component Value Date/Time   INR 1.38 03/03/2012 1750     Intake/Output Summary (Last 24 hours) at 03/04/12 0925 Last data filed at 03/04/12 0800  Gross per 24 hour  Intake 5394.83 ml  Output    760 ml  Net 4634.83 ml   NGT output:  50cc  PCXR 03/03/12: IMPRESSION:  1. No central line visible. Nasogastric tube projects below the  diaphragm.  2. Bibasilar atelectasis. Asymmetric right apical density has  increased from the prior examination and could reflect inflammation  or atelectasis. Radiographic followup is recommended to exclude a    developing nodule.   Assessment/Plan:  78 y.o. female is s/p embolectomy of the superior mesenteric artery with bovine pericardial patch angioplasty of superior mesenteric artery   POD 1  -acute surgical blood loss anemia - tolerating -ck labs in am -continue NGT and NPO  -denies nausea or abdominal pain as in pre-operatively. -mobilize-PT/OT -continue heparin -await echo results -will need follow up radiographic studies to exclude a developing nodule right lung as it has increased since prior exam.   Doreatha Massed, PA-C [facility] [phone] 03/04/2012 9:25 AM  Minimal drainage from NGT (50cc) No flatus yet. Ok to transfer to regular floor from Vascular standpoint.  Ambulate.  Results of Echo pending.  On heparin.   Di Kindle. Edilia Bo, MD, FACS Beeper [phone] 03/04/2012

## 2012-03-04 NOTE — Progress Notes (Signed)
Pt attempted to void on bsc, only void 30cc of amber urine. Bladder scanned for 100cc. Will continue to monitor. Beryle Quant

## 2012-03-04 NOTE — Progress Notes (Signed)
Pt does not have urge to void; bladder scan indicates 154 ml of urine in bladder; NP Claiborne Billings notified; new order to continue to monitor for spontaneous void until midnight, if no void, notify MD for new orders

## 2012-03-04 NOTE — Progress Notes (Signed)
[facility] hospitalist progress note. Chief complaint. Transfer note. History of present illness. This 78 year old female admitted with sudden onset abdominal pain. She was noted to have a superior mesenteric artery thrombus and vascular surgery to the patient to surgery today and performed embolectomy of the superior mesenteric artery. It was decided to transfer the patient to [facility] for ongoing care. Patient does carry a concurrent diagnosis of atrial fib. Nursing has noted that the patient's pulse rate has mainly been in the 80-100 range but intermittently has been having bursts of tachycardia into the 130 to 150 range. I did see the patient at the bedside and she has no specific complaints other than some abdominal pain postoperatively. I note that she was maintained on both Cardizem and Toprol per home medication list. She is currently on IV when necessary labetalol for hypertension and Metroprolol for hypertension or pulse sustained greater than 130. Vital signs. Temperature 90.8, pulse 112, respirations 17, blood pressure 136/65. O2 sats 94%. General appearance. Well-developed elderly female in no distress. She is somewhat somnolent but easily arousable. She is alert when aroused and oriented. Cardiac. Irregularly irregular. Heart rate  in the 90-100 range during the course of my examination. There's no jugular venous distention. No significant edema. Lungs. Breath sounds are clear and equal bilaterally. Abdomen. Soft with hypotonic bowel sounds. Patient does have a postoperative pain. The incision looks clean and dry. Impression/plan. Problem #1 superior mesenteric artery thrombus. Status post embolectomy 03/03/12. Patient appears medically stable post transfer. Problem #2 atrial fib. This is a chronic problem. Currently her rate is some mainly in the 90s with only intermittent bursts of tachycardia. Patient does have a when necessary to utilize in the case of sustained tachycardia. Will  also consider a Cardizem drip if the Metroprolol proves ineffective. I will discontinue the labetalol. Patient appears medically stable at the time of this assessment and all orders appear to of transferred appropriately.

## 2012-03-04 NOTE — Progress Notes (Signed)
Pt had foley d/c'd 6 hours ago no void yet and no urges to void. Bladder scanned for 110cc. Dr Sharon Seller at bedside, will order ns bolus and increase ivf, to bladder scan her in 2 hours if no void. Beryle Quant

## 2012-03-04 NOTE — Progress Notes (Signed)
ANTICOAGULATION CONSULT NOTE - Follow Up Consult  Pharmacy Consult for Heparin Indication: SMA embolus s/p successful embolectomy; Hx Afib (not on chronic anticoagulation)  Allergies  Allergen Reactions  . Penicillins     angioedema  . Alendronate Sodium     REACTION: INTOL to Fosamax per pt.  . Atorvastatin     REACTION: INTOL to Lipitor per pt.    Patient Measurements: Height: 5\' 5"  (165.1 cm) Weight: 151 lb 10.8 oz (68.8 kg) IBW/kg (Calculated) : 57  Heparin Dosing Weight: 68.8kg  Vital Signs: Temp: 99 F (37.2 C) (07/13 0806) Temp src: Oral (07/13 0806) BP: 122/54 mmHg (07/13 0806) Pulse Rate: 95  (07/13 0806)  Labs:  Basename 03/04/12 0800 03/04/12 0426 03/03/12 2132 03/03/12 1750 03/03/12 1235 03/03/12 0351 03/02/12 2340  HGB -- 11.6* -- 12.8 -- -- --  HCT -- 35.2* -- 37.7 -- -- 45.6  PLT -- 173 -- 156 -- -- 225  APTT -- -- 101* >200* -- -- --  LABPROT -- -- -- 17.2* -- 13.9 --  INR -- -- -- 1.38 -- 1.05 --  HEPARINUNFRC 0.39 -- -- -- 1.00* -- --  CREATININE -- 0.51 -- 0.48* -- -- 0.85  CKTOTAL -- -- -- -- -- -- --  CKMB -- -- -- -- -- -- --  TROPONINI -- -- -- -- -- -- <0.30    Estimated Creatinine Clearance: 53.7 ml/min (by C-G formula based on Cr of 0.51).   Medications:  Prescriptions prior to admission  Medication Sig Dispense Refill  . ALPRAZolam (XANAX) 0.5 MG tablet Take 0.25 mg by mouth 3 (three) times daily as needed. For anxiety      . aspirin 81 MG tablet Take 162 mg by mouth daily. Take 2 tablets  By mouth once daily      . Calcium Carbonate-Vitamin D (CALTRATE 600+D) 600-400 MG-UNIT per tablet Take 1 tablet by mouth 2 (two) times daily.        . cholecalciferol (VITAMIN D) 1000 UNITS tablet Take 2,000 Units by mouth daily.       Marland Kitchen diltiazem (CARDIZEM CD) 180 MG 24 hr capsule TAKE ONE CAPSULE BY MOUTH EVERY DAY.  30 capsule  6  . metoprolol succinate (TOPROL-XL) 25 MG 24 hr tablet TAKE ONE TABLET BY MOUTH EVERY DAY  30 tablet  12  . Multiple  Vitamins-Minerals (CENTRUM SILVER PO) Take 1 tablet by mouth daily.        . Multiple Vitamins-Minerals (EYE VITAMINS) TABS Take 1 tablet by mouth daily.        Marland Kitchen omeprazole (PRILOSEC OTC) 20 MG tablet Take 20 mg by mouth daily.        . simvastatin (ZOCOR) 20 MG tablet TAKE ONE TABLET BY MOUTH AT BEDTIME  30 tablet  5    Assessment: abdominal pain --> mesenteric artery thrombus (non occlusive)  83yof admitted with SMA embolus on heparin s/p embolectomy. Patient also has h/o Afib but has not been on chronic anticoagulation. Heparin level 0.39 this am. Hgb down slightly to 11.6. Platelets stable.   ID: Tmax 99. WBC 11.9. Post op Vanco today.  Cards:h/o afib.  VSS. Intermittent tachy noted by nursing. Meds: ASA 81mg , diltiazem, metoprolol, Zocor  Goal of Therapy:  Heparin level 0.3-0.7 units/ml Monitor platelets by anticoagulation protocol: Yes   Plan:  1. Continue heparin 750 units/hr. 2. Recheck heparin level in 6 hrs to confirm remains therapeutic.   Misty Stanley Stillinger 03/04/2012,9:09 AM

## 2012-03-04 NOTE — Progress Notes (Signed)
OT Note:  Pt back to bed after PT and too fatiqued for OT.  Will check back another day. [place], OTR/L [phone] 03/04/2012

## 2012-03-04 NOTE — Evaluation (Signed)
Physical Therapy Evaluation Patient Details Name: Lisa Douglas MRN: [id number] DOB: 1927/10/28 Today's Date: 03/04/2012 Time: 4098-1191 PT Time Calculation (min): 15 min  PT Assessment / Plan / Recommendation Clinical Impression  Pt presents s/p embolectomy of superior mesenteric artery thrombus. Pts functional mobility limited by pain and deconditioning. Will continue to reassess with pt willingness for ambulation. Pt will benefit from skilled PT in the acute care setting in order to maximize functional mobility prior to d/c home with family    PT Assessment  Patient needs continued PT services    Follow Up Recommendations  Home health PT;Supervision/Assistance - 24 hour    Barriers to Discharge        Equipment Recommendations  None recommended by PT    Recommendations for Other Services     Frequency Min 3X/week    Precautions / Restrictions Precautions Precautions: Fall Restrictions Weight Bearing Restrictions: No         Mobility  Bed Mobility Bed Mobility: Sit to Supine Sit to Supine: 4: Min assist Details for Bed Mobility Assistance: Min assist with LEs into bed due to abdominal pain with abdominal contractions Transfers Transfers: Sit to Stand;Stand to Sit;Stand Pivot Transfers Sit to Stand: 1: +2 Total assist;With upper extremity assist;From chair/3-in-1 Sit to Stand: Patient Percentage: 60% Stand to Sit: 1: +2 Total assist;With upper extremity assist;To bed Stand to Sit: Patient Percentage: 60% Stand Pivot Transfers: Other (comment);4: Min assist (with RW) Details for Transfer Assistance: VC for proper sequencing and hand placement for safety upon standing. Assist for stability during sit to and from stand and during transfer from chair to bed.  Ambulation/Gait Ambulation/Gait Assistance: Not tested (comment) (pt declined)    Exercises     PT Diagnosis: Difficulty walking;Acute pain  PT Problem List: Decreased strength;Decreased activity  tolerance;Decreased mobility;Decreased knowledge of use of DME;Decreased safety awareness;Decreased knowledge of precautions;Pain PT Treatment Interventions: DME instruction;Gait training;Stair training;Functional mobility training;Therapeutic activities;Therapeutic exercise;Patient/family education   PT Goals Acute Rehab PT Goals PT Goal Formulation: With patient Time For Goal Achievement: 03/11/12 Potential to Achieve Goals: Fair Pt will go Supine/Side to Sit: with modified independence PT Goal: Supine/Side to Sit - Progress: Goal set today Pt will go Sit to Supine/Side: with modified independence PT Goal: Sit to Supine/Side - Progress: Goal set today Pt will go Sit to Stand: with supervision PT Goal: Sit to Stand - Progress: Goal set today Pt will go Stand to Sit: with supervision PT Goal: Stand to Sit - Progress: Goal set today Pt will Transfer Bed to Chair/Chair to Bed: with supervision PT Transfer Goal: Bed to Chair/Chair to Bed - Progress: Goal set today Pt will Ambulate: 51 - 150 feet;with supervision;with least restrictive assistive device PT Goal: Ambulate - Progress: Goal set today Pt will Go Up / Down Stairs: 3-5 stairs;with supervision;with rail(s) PT Goal: Up/Down Stairs - Progress: Goal set today  Visit Information  Last PT Received On: 03/04/12 Assistance Needed: +2    Subjective Data      Prior Functioning  Home Living Lives With: Alone Available Help at Discharge: Family;Available 24 hours/day Type of Home: House Home Access: Stairs to enter [employer] of Steps: 3 Entrance Stairs-Rails: Can reach both Home Layout: Two level (can have bedroom/bathroom on 1st floor) Alternate Level Stairs-Number of Steps: 12 Alternate Level Stairs-Rails: Can reach both Bathroom Shower/Tub: Tub/shower unit;Curtain Bathroom Toilet: Standard Bathroom Accessibility: Yes How Accessible: Accessible via walker Home Adaptive Equipment: Bedside commode/3-in-1;Walker -  rolling;Straight cane Prior Function Level of Independence:  Independent Able to Take Stairs?: Yes Driving: Yes Vocation: Retired Musician: No difficulties Dominant Hand: Right    Cognition  Overall Cognitive Status: Appears within functional limits for tasks assessed/performed Arousal/Alertness: Awake/alert Orientation Level: Appears intact for tasks assessed Behavior During Session: [facility] for tasks performed    Extremity/Trunk Assessment Right Lower Extremity Assessment RLE ROM/Strength/Tone: Deficits RLE ROM/Strength/Tone Deficits: grossly 4/5 RLE Sensation: WFL - Light Touch Left Lower Extremity Assessment LLE ROM/Strength/Tone: Deficits LLE ROM/Strength/Tone Deficits: grossly 4/5 LLE Sensation: WFL - Light Touch   Balance    End of Session PT - End of Session Equipment Utilized During Treatment: Gait belt Activity Tolerance: Patient limited by fatigue;Patient limited by pain Patient left: in bed;with call bell/phone within reach;with bed alarm set;with family/visitor present Nurse Communication: Mobility status   Milana Kidney 03/04/2012, 4:04 PM  03/04/2012 Milana Kidney DPT PAGER: [phone] OFFICE: [phone]

## 2012-03-04 NOTE — Progress Notes (Signed)
[facility] Progress Note [place] TEAM 1 - Stepdown/ICU TEAM   Lisa Douglas ZOX:[id number] DOB: 06/14/1928 DOA: 03/02/2012 PCP: Michele Mcalpine, MD  Assessment/Plan:  Acute Superior mesenteric artery thrombosis Felt to likely represent embolic phenomenon related to atrial fibrillation in that there were no preceding symptoms consistent with gut claudication - now status post embolectomy of the superior mesenteric artery with bovine pericardial patch angioplasty of superior mesenteric artery - remains on full anticoagulation (heparin) - we'll need to talk the patient further about chronic anticoagulation with Coumadin again  Acute blood loss anemia Due to expected blood loss from surgery - we will follow trend to assure there is no evidence of occult GI bleeding given prior history of AVMs  ATRIAL FIBRILLATION, CHRONIC Currently the patient is suffering with a rapid ventricular response - I will hydrate her gently and adjust her beta blocker therapy - in that she may in fact require an IV drip I will keep her in the step down unit for now - a transthoracic echo has been obtained to evaluate for possible intracardiac thrombus  HYPERLIPIDEMIA Resume treatment when able to take oral meds  Possible right lung nodule Will consider CT of the chest once the patient is hydrated and more stable  ANGIODYSPLASIA, COLON with prior history of GI bleeding Patient was previously taken off of Coumadin due to recurrent GI bleeding - at this time it would appear we have little choice but to continue anticoagulation - we will need to revisit ongoing dosing of Coumadin prior to her discharge  Code Status: Full Family Communication: communicated with the patient and niece at bedside Disposition Plan: Remain in step down unit until heart rate/A. fib better controlled  Brief narrative: 78 y.o. female with history of chronic atrial fibrillation (not on full anticoagulation due to AVM), anxiety, GERD,  and hyperlipidemia, who presented to the emergency room complaining of the acute onset of abdominal pain for 2 days, accompanied with nausea and vomiting along with watery diarrhea. She denied any recent antibiotic. She denied black stool or bloody stool. She denied any fever or chills. Evaluation in emergency room included an abdominal pelvic CT which revealed a superior mesenteric artery non-occlusive thrombus, but no evidence of ischemic bowel (along with incidental finding of a small pulmonary nodule and calcification in the aorta).  Consultants: Vascular surgery GI - telephone consultation  Procedures: 03/03/2012 - embolectomy of the superior mesenteric artery with bovine pericardial patch angioplasty of superior mesenteric artery   Antibiotics: None   HPI/Subjective: The patient is currently resting comfortably in the hospital bed.  She denies shortness of breath fevers chills nausea vomiting or sensation of palpitations.  She is experiencing a modest amount of abdominal pain as would be expected.  She has not yet been able to urinate but a bladder scan reveals only approximately 100 cc of urine in the bladder.   Objective: Blood pressure 110/63, pulse 75, temperature 97.8 F (36.6 C), temperature source Oral, resp. rate 11, height 5\' 5"  (1.651 m), weight 68.8 kg (151 lb 10.8 oz), SpO2 92.00%.  Intake/Output Summary (Last 24 hours) at 03/04/12 1611 Last data filed at 03/04/12 0800  Gross per 24 hour  Intake 2817.5 ml  Output    610 ml  Net 2207.5 ml     Exam: General: No acute respiratory distress Lungs: Clear to auscultation bilaterally without wheezes or crackles Cardiovascular: Irregularly irregular with tachycardia but without gallop or rub Abdomen: Midline incision well approximated and dry, bowel sounds not appreciable,  mildly tender to soft palpation throughout, nondistended, no rebound Extremities: No significant cyanosis, clubbing, or edema bilateral lower  extremities  Data Reviewed: Basic Metabolic Panel:  Lab 03/04/12 1610 03/03/12 1750 03/02/12 2340  NA 139 134* 136  K 3.9 3.2* 4.1  CL 108 102 100  CO2 23 20 23   GLUCOSE 141* 152* 141*  BUN 11 12 21   CREATININE 0.51 0.48* 0.85  CALCIUM 7.7* 7.3* 9.6  MG 1.6 1.4* --  PHOS -- -- --   Liver Function Tests:  Lab 03/04/12 0426 03/02/12 2340  AST 28 33  ALT 18 19  ALKPHOS 46 71  BILITOT 0.4 0.6  PROT 5.4* 7.5  ALBUMIN 2.7* 4.2    Lab 03/04/12 0426 03/02/12 2340  LIPASE -- 25  AMYLASE 30 --   CBC:  Lab 03/04/12 0426 03/03/12 1750 03/02/12 2340  WBC 11.9* 11.6* 13.8*  NEUTROABS -- -- 12.1*  HGB 11.6* 12.8 15.5*  HCT 35.2* 37.7 45.6  MCV 95.1 94.5 94.4  PLT 173 156 225   Cardiac Enzymes:  Lab 03/02/12 2340  CKTOTAL --  CKMB --  CKMBINDEX --  TROPONINI <0.30    Recent Results (from the past 240 hour(s))  MRSA PCR SCREENING     Status: Normal   Collection Time   03/03/12  8:54 PM      Component Value Range Status Comment   MRSA by PCR NEGATIVE  NEGATIVE Final      Studies:  Recent x-ray studies have been reviewed in detail by the Attending Physician  Scheduled Meds:  Reviewed in detail by the Attending Physician   Lonia Blood, MD [facility] Office  [phone] Pager [phone]  On-Call/Text Page:      [email or web address]      password [email or web address]  If 7PM-7AM, please contact night-coverage [email or web address] Password [email or web address] 03/04/2012, 4:11 PM   LOS: 2 days

## 2012-03-04 NOTE — Progress Notes (Signed)
  Echocardiogram 2D Echocardiogram has been performed.  Judie Hollick FRANCES 03/04/2012, 1:07 PM

## 2012-03-05 DIAGNOSIS — K5520 Angiodysplasia of colon without hemorrhage: Secondary | ICD-10-CM

## 2012-03-05 LAB — BASIC METABOLIC PANEL
BUN: 9 mg/dL (ref 6–23)
CO2: 22 mEq/L (ref 19–32)
Calcium: 7.9 mg/dL — ABNORMAL LOW (ref 8.4–10.5)
Chloride: 109 mEq/L (ref 96–112)
Creatinine, Ser: 0.52 mg/dL (ref 0.50–1.10)

## 2012-03-05 LAB — CBC
HCT: 35.5 % — ABNORMAL LOW (ref 36.0–46.0)
MCH: 30.9 pg (ref 26.0–34.0)
MCV: 97 fL (ref 78.0–100.0)
Platelets: 170 10*3/uL (ref 150–400)
RDW: 13.4 % (ref 11.5–15.5)
WBC: 10 10*3/uL (ref 4.0–10.5)

## 2012-03-05 LAB — PROTIME-INR: INR: 1.2 (ref 0.00–1.49)

## 2012-03-05 MED ORDER — WARFARIN SODIUM 4 MG PO TABS
4.0000 mg | ORAL_TABLET | Freq: Once | ORAL | Status: DC
  Filled 2012-03-05 (×2): qty 1

## 2012-03-05 MED ORDER — METOPROLOL TARTRATE 25 MG PO TABS
25.0000 mg | ORAL_TABLET | Freq: Two times a day (BID) | ORAL | Status: DC
  Administered 2012-03-05 – 2012-03-07 (×4): 25 mg via ORAL
  Filled 2012-03-05 (×7): qty 1

## 2012-03-05 MED ORDER — DILTIAZEM LOAD VIA INFUSION
10.0000 mg | Freq: Once | INTRAVENOUS | Status: AC
  Administered 2012-03-05: 10 mg via INTRAVENOUS
  Filled 2012-03-05: qty 10

## 2012-03-05 MED ORDER — ALPRAZOLAM 0.25 MG PO TABS
0.2500 mg | ORAL_TABLET | Freq: Three times a day (TID) | ORAL | Status: DC | PRN
  Filled 2012-03-05: qty 1

## 2012-03-05 MED ORDER — WARFARIN - PHARMACIST DOSING INPATIENT
Freq: Every day | Status: DC
  Administered 2012-03-06: 19:00:00

## 2012-03-05 MED ORDER — DILTIAZEM HCL ER COATED BEADS 180 MG PO CP24: 180.0000 mg | ORAL_CAPSULE | Freq: Every day | ORAL | Status: DC

## 2012-03-05 MED ORDER — ACETAMINOPHEN 325 MG PO TABS
650.0000 mg | ORAL_TABLET | Freq: Four times a day (QID) | ORAL | Status: DC | PRN
  Administered 2012-03-05 – 2012-03-07 (×2): 650 mg via ORAL
  Filled 2012-03-05 (×2): qty 2

## 2012-03-05 MED ORDER — SIMVASTATIN 20 MG PO TABS
20.0000 mg | ORAL_TABLET | Freq: Every day | ORAL | Status: DC
  Administered 2012-03-05 – 2012-03-06 (×2): 20 mg via ORAL
  Filled 2012-03-05 (×4): qty 1

## 2012-03-05 MED ORDER — SALINE SPRAY 0.65 % NA SOLN
1.0000 | NASAL | Status: DC | PRN
  Administered 2012-03-06: 1 via NASAL
  Filled 2012-03-05: qty 44

## 2012-03-05 MED ORDER — DILTIAZEM HCL 100 MG IV SOLR
5.0000 mg/h | INTRAVENOUS | Status: AC
  Administered 2012-03-05 (×2): 10 mg/h via INTRAVENOUS
  Administered 2012-03-05: 5 mg/h via INTRAVENOUS
  Administered 2012-03-05: 10 mg/h via INTRAVENOUS
  Filled 2012-03-05 (×3): qty 100

## 2012-03-05 MED ORDER — WARFARIN VIDEO
Freq: Once | Status: AC
  Administered 2012-03-05: 19:00:00

## 2012-03-05 MED ORDER — PATIENT'S GUIDE TO USING COUMADIN BOOK
Freq: Once | Status: AC
  Administered 2012-03-05: 19:00:00
  Filled 2012-03-05: qty 1

## 2012-03-05 MED ORDER — HYDROMORPHONE HCL PF 1 MG/ML IJ SOLN: 1.0000 mg | INTRAMUSCULAR | Status: DC | PRN

## 2012-03-05 MED ORDER — ASPIRIN EC 81 MG PO TBEC
81.0000 mg | DELAYED_RELEASE_TABLET | Freq: Every day | ORAL | Status: DC
  Administered 2012-03-05 – 2012-03-06 (×2): 81 mg via ORAL
  Filled 2012-03-05 (×2): qty 1

## 2012-03-05 MED ORDER — ALPRAZOLAM 0.25 MG PO TABS: 0.2500 mg | ORAL_TABLET | Freq: Two times a day (BID) | ORAL | Status: DC | PRN

## 2012-03-05 NOTE — Progress Notes (Signed)
Pt unable to void; in and out cath performed per order; 300 ml urine returned; will continue to monitor

## 2012-03-05 NOTE — Progress Notes (Signed)
ANTICOAGULATION CONSULT NOTE - Follow Up Consult  Pharmacy Consult for Heparin Indication: SMA embolus s/p successful embolectomy; Hx Afib (not on chronic anticoagulation)  Allergies  Allergen Reactions  . Penicillins     angioedema  . Alendronate Sodium     REACTION: INTOL to Fosamax per pt.  . Atorvastatin     REACTION: INTOL to Lipitor per pt.    Patient Measurements: Height: 5\' 5"  (165.1 cm) Weight: 151 lb 10.8 oz (68.8 kg) IBW/kg (Calculated) : 57  Heparin Dosing Weight: 68.8kg  Vital Signs: Temp: 98.4 F (36.9 C) (07/13 1930) Temp src: Oral (07/13 1930) BP: 112/58 mmHg (07/14 0600) Pulse Rate: 97  (07/14 0600)  Labs:  Basename 03/05/12 0430 03/04/12 1500 03/04/12 0800 03/04/12 0426 03/03/12 2132 03/03/12 1750 03/03/12 0351 03/02/12 2340  HGB 11.3* -- -- 11.6* -- -- -- --  HCT 35.5* -- -- 35.2* -- 37.7 -- --  PLT 170 -- -- 173 -- 156 -- --  APTT -- -- -- -- 101* >200* -- --  LABPROT -- -- -- -- -- 17.2* 13.9 --  INR -- -- -- -- -- 1.38 1.05 --  HEPARINUNFRC 0.20* 0.56 0.39 -- -- -- -- --  CREATININE 0.52 -- -- 0.51 -- 0.48* -- --  CKTOTAL -- -- -- -- -- -- -- --  CKMB -- -- -- -- -- -- -- --  TROPONINI -- -- -- -- -- -- -- <0.30    Estimated Creatinine Clearance: 52.8 ml/min (by C-G formula based on Cr of 0.52).   Assessment: 71 yof admitted with SMA embolus on heparin s/p embolectomy. Patient also has h/o Afib but has not been on chronic anticoagulation. Heparin level 0.2 (subtherapeutic) this a.m. No bleeding noted per RN. No issues with line.  Goal of Therapy:  Heparin level 0.3-0.7 units/ml Monitor platelets by anticoagulation protocol: Yes   Plan:  1. Increase heparin to 900 units/hr. 2. F/u 8 hr heparin level  Christoper Fabian, PharmD, BCPS Clinical pharmacist, pager [phone] 03/05/2012,6:23 AM

## 2012-03-05 NOTE — Progress Notes (Signed)
OT Cancellation Note  Treatment cancelled today due to medical issues with patient which prohibited therapy. RVR. Will attempt again tomorrow.   [facility] Madora Barletta, OTR/L  [phone] 03/05/2012 03/05/2012, 6:21 PM

## 2012-03-05 NOTE — Progress Notes (Signed)
VASCULAR PROGRESS NOTE  SUBJECTIVE: Wants NGT out. Denies nausea.   PHYSICAL EXAM: Filed Vitals:   03/05/12 0545 03/05/12 0600 03/05/12 0630 03/05/12 0712  BP: 96/59 112/58 101/78 116/56  Pulse: 100 97 127 114  Temp:      TempSrc:      Resp: 17 17 21 16   Height:      Weight:      SpO2: 96% 96% 97% 93%   Incision fine. Hypoactive bowel sounds.   LABS: Lab Results  Component Value Date   WBC 10.0 03/05/2012   HGB 11.3* 03/05/2012   HCT 35.5* 03/05/2012   MCV 97.0 03/05/2012   PLT 170 03/05/2012   Lab Results  Component Value Date   CREATININE 0.52 03/05/2012   Lab Results  Component Value Date   INR 1.38 03/03/2012   ASSESSMENT/PLAN: 1. 2 Days Post-Op s/p: SMA embolectomy 2. ECHO: EF 60%, normal wall motion. No mention of clot.  3. Pt is on heparin, I would favor coumadin in order to lower her risk of further embolic events.  4. D/C NGT, keep NPO, except it would be ok to give meds with sips of water.   Waverly Ferrari, MD, FACS Beeper: [phone] 03/05/2012

## 2012-03-05 NOTE — Progress Notes (Signed)
[facility] Progress Note [place] TEAM 1 - Stepdown/ICU TEAM   Lisa Douglas ZOX:[id number] DOB: [birth year] DOA: 03/02/2012 PCP: Michele Mcalpine, MD  Assessment/Plan:  Acute Superior mesenteric artery thrombosis Felt to likely represent embolic phenomenon related to atrial fibrillation in that there were no preceding symptoms consistent with gut claudication - now status post embolectomy of the superior mesenteric artery with bovine pericardial patch angioplasty of superior mesenteric artery - remains on full anticoagulation (heparin) - adding coumadin today per pharmacy  Acute blood loss anemia Due to expected blood loss from surgery - cont to follow trend to assure there is no evidence of occult GI bleeding given prior history of AVMs  ATRIAL FIBRILLATION, CHRONIC Currently the patient is suffering with RVR -overnight she required cardizem IV due to poor rate control - will cont iv cardizem for now - add back scheduled BB - in that she may in fact require an IV drip I will keep her in the step down unit for now - a transthoracic echo reveals no evidence of an intracardiac thrombus  HYPERLIPIDEMIA Resume treatment when able to take oral meds  Possible right lung nodule Will consider CT of the chest once the patient is hydrated and more stable  ANGIODYSPLASIA, COLON with prior history of GI bleeding Patient was previously taken off of Coumadin due to recurrent GI bleeding - at this time it would appear we have little choice but to continue anticoagulation - we will need to arrange for f/u in the [facility] at the time of her d/c   Code Status: Full Family Communication: communicated with the patient and great great niece at bedside Disposition Plan: Remain in step down unit until heart rate/A. fib better controlled  Brief narrative: 78 y.o. female with history of chronic atrial fibrillation (not on full anticoagulation due to AVM), anxiety, GERD, and hyperlipidemia,  who presented to the emergency room complaining of the acute onset of abdominal pain for 2 days, accompanied with nausea and vomiting along with watery diarrhea. She denied any recent antibiotic. She denied black stool or bloody stool. She denied any fever or chills. Evaluation in emergency room included an abdominal pelvic CT which revealed a superior mesenteric artery non-occlusive thrombus, but no evidence of ischemic bowel (along with incidental finding of a small pulmonary nodule and calcification in the aorta).  Consultants: Vascular surgery GI - telephone consultation  Procedures: 03/03/2012 - embolectomy of the superior mesenteric artery with bovine pericardial patch angioplasty of superior mesenteric artery  03/04/2012 2D Echo - no evidence of intracardiac thrombus - preserved LV systolic fxn  Antibiotics: None   HPI/Subjective: The patient has been urinating w/o difficulty today.  She reports only mild abdom pain.  She denies f/c, sob, n/v, or cp.   Objective: Blood pressure 116/56, pulse 109, temperature 98.7 F (37.1 C), temperature source Oral, resp. rate 25, height 5\' 5"  (1.651 m), weight 68.8 kg (151 lb 10.8 oz), SpO2 91.00%.  Intake/Output Summary (Last 24 hours) at 03/05/12 1536 Last data filed at 03/05/12 1332  Gross per 24 hour  Intake 2300.94 ml  Output    735 ml  Net 1565.94 ml     Exam: General: No acute respiratory distress Lungs: Clear to auscultation bilaterally without wheezes or crackles Cardiovascular: Irregularly irregular without gallop or rub Abdomen: Midline incision well approximated and dry, bowel sounds faint but present, mildly tender to soft palpation throughout, nondistended, no rebound Extremities: No significant cyanosis, clubbing, or edema bilateral lower extremities  Data  Reviewed: Basic Metabolic Panel:  Lab 03/05/12 6295 03/04/12 0426 03/03/12 1750 03/02/12 2340  NA 139 139 134* 136  K 4.0 3.9 3.2* 4.1  CL 109 108 102 100  CO2 22  23 20 23   GLUCOSE 141* 141* 152* 141*  BUN 9 11 12 21   CREATININE 0.52 0.51 0.48* 0.85  CALCIUM 7.9* 7.7* 7.3* 9.6  MG 1.9 1.6 1.4* --  PHOS -- -- -- --   Liver Function Tests:  Lab 03/04/12 0426 03/02/12 2340  AST 28 33  ALT 18 19  ALKPHOS 46 71  BILITOT 0.4 0.6  PROT 5.4* 7.5  ALBUMIN 2.7* 4.2    Lab 03/04/12 0426 03/02/12 2340  LIPASE -- 25  AMYLASE 30 --   CBC:  Lab 03/05/12 0430 03/04/12 0426 03/03/12 1750 03/02/12 2340  WBC 10.0 11.9* 11.6* 13.8*  NEUTROABS -- -- -- 12.1*  HGB 11.3* 11.6* 12.8 15.5*  HCT 35.5* 35.2* 37.7 45.6  MCV 97.0 95.1 94.5 94.4  PLT 170 173 156 225   Cardiac Enzymes:  Lab 03/02/12 2340  CKTOTAL --  CKMB --  CKMBINDEX --  TROPONINI <0.30    Recent Results (from the past 240 hour(s))  MRSA PCR SCREENING     Status: Normal   Collection Time   03/03/12  8:54 PM      Component Value Range Status Comment   MRSA by PCR NEGATIVE  NEGATIVE Final      Studies:  Recent x-ray studies have been reviewed in detail by the Attending Physician  Scheduled Meds:  Reviewed in detail by the Attending Physician   Lonia Blood, MD [facility] Office  [phone] Pager [phone]  On-Call/Text Page:      [email or web address]      password [email or web address]  If 7PM-7AM, please contact night-coverage [email or web address] Password [email or web address] 03/05/2012, 3:36 PM   LOS: 3 days

## 2012-03-05 NOTE — Progress Notes (Addendum)
ANTICOAGULATION CONSULT NOTE - Follow Up Consult  Pharmacy Consult for Heparin / Coumadin Indication: SMA embolus s/p successful embolectomy; Hx Afib (not on chronic anticoagulation)  Allergies  Allergen Reactions  . Penicillins     angioedema  . Alendronate Sodium     REACTION: INTOL to Fosamax per pt.  . Atorvastatin     REACTION: INTOL to Lipitor per pt.   Labs:  Basename 03/05/12 1503 03/05/12 0430 03/04/12 1500 03/04/12 0426 03/03/12 2132 03/03/12 1750 03/03/12 0351 03/02/12 2340  HGB -- 11.3* -- 11.6* -- -- -- --  HCT -- 35.5* -- 35.2* -- 37.7 -- --  PLT -- 170 -- 173 -- 156 -- --  APTT -- -- -- -- 101* >200* -- --  LABPROT -- -- -- -- -- 17.2* 13.9 --  INR -- -- -- -- -- 1.38 1.05 --  HEPARINUNFRC 0.59 0.20* 0.56 -- -- -- -- --  CREATININE -- 0.52 -- 0.51 -- 0.48* -- --  CKTOTAL -- -- -- -- -- -- -- --  CKMB -- -- -- -- -- -- -- --  TROPONINI -- -- -- -- -- -- -- <0.30    Estimated Creatinine Clearance: 52.8 ml/min (by C-G formula based on Cr of 0.52).   Assessment: 49 yof admitted with SMA embolus on heparin s/p embolectomy. Patient also has h/o Afib but has not been on chronic anticoagulation.  No bleeding noted per RN. No issues with line.  Heparin level = 0.59, therapeutic  Beginning Coumadin today  Goal of Therapy:  Heparin level 0.3-0.7 units/ml Monitor platelets by anticoagulation protocol: Yes   Plan:  1. Continue heparin at 900 units/hr. 2. F/u AM heparin level 3. Coumadin 4 mg po x 1 today 4. Daily INR  Okey Regal, PharmD [phone] 03/05/2012,3:35 PM

## 2012-03-05 NOTE — Progress Notes (Signed)
[facility] progress note. Chief complaint. Tachycardia. History of present illness. This 78 year old female in hospital with a superior mesenteric artery thrombus. She has a history of atrial fibrillation and has been suffering from rapid ventricular response. Plan was to hydrate her gently and increase beta blocker therapy. Patient is n.p.o. currently. Her home medications included Cardizem CD 180 mg daily and Toprol-XL 25 mg daily. Nursing called me indicating the patient's a heart rate was sustained in the 120-150 range. Her latest blood pressure 141/77. 12-lead EKG was obtained and this did confirm atrial fib with rapid ventricular response. I did come to see the patient at the bedside. She is alert and complains of some abdominal pain postoperatively. She denies any chest pain or dyspnea. Vital signs. Temperature 98.4, pulse 120s to 150s at the bedside, respiration 23, latest blood pressure 141/77. General appearance. Frail appearing elderly female in no distress. Chest alert, oriented and cooperative. Cardiac. Irregular and tachycardic. Lungs. Clear and equal. Abdomen. Soft with hypotonic bowel sounds. I did not palpate given the recent surgery. Impression/plan. Problem #1. Atrial fibrillation with rapid ventricular response. I have gone discontinued her IV beta blocker for now. We'll give a 10 mg Cardizem bolus and follow this with a Cardizem drip 5-10 mg per hour titrated to keep heart rate in the 65-105 range. Nursing will notify me if this does not prove to be effective. Further adjustments later this a.m. per the discretion of the [facility] hospital[facility] rounding physician.

## 2012-03-06 LAB — CBC
Hemoglobin: 8.2 g/dL — ABNORMAL LOW (ref 12.0–15.0)
MCHC: 32.2 g/dL (ref 30.0–36.0)
Platelets: 192 10*3/uL (ref 150–400)
RBC: 2.61 MIL/uL — ABNORMAL LOW (ref 3.87–5.11)
RBC: 3.38 MIL/uL — ABNORMAL LOW (ref 3.87–5.11)
RDW: 15.6 % — ABNORMAL HIGH (ref 11.5–15.5)
WBC: 10.5 10*3/uL (ref 4.0–10.5)

## 2012-03-06 LAB — HEMOGLOBIN AND HEMATOCRIT, BLOOD
HCT: 24.2 % — ABNORMAL LOW (ref 36.0–46.0)
Hemoglobin: 8 g/dL — ABNORMAL LOW (ref 12.0–15.0)

## 2012-03-06 LAB — HEPARIN LEVEL (UNFRACTIONATED)
Heparin Unfractionated: 0.26 IU/mL — ABNORMAL LOW (ref 0.30–0.70)
Heparin Unfractionated: 0.91 IU/mL — ABNORMAL HIGH (ref 0.30–0.70)

## 2012-03-06 LAB — PREPARE RBC (CROSSMATCH)

## 2012-03-06 MED ORDER — FUROSEMIDE 10 MG/ML IJ SOLN
40.0000 mg | Freq: Once | INTRAMUSCULAR | Status: AC
  Administered 2012-03-06: 40 mg via INTRAVENOUS
  Filled 2012-03-06: qty 4

## 2012-03-06 MED ORDER — BISACODYL 10 MG RE SUPP
10.0000 mg | Freq: Every day | RECTAL | Status: DC | PRN
  Administered 2012-03-06 – 2012-03-09 (×2): 10 mg via RECTAL
  Filled 2012-03-06 (×2): qty 1

## 2012-03-06 MED ORDER — HEPARIN (PORCINE) IN NACL 100-0.45 UNIT/ML-% IJ SOLN
900.0000 [IU]/h | INTRAMUSCULAR | Status: DC
  Filled 2012-03-06 (×2): qty 250

## 2012-03-06 MED ORDER — DILTIAZEM HCL ER COATED BEADS 180 MG PO CP24
180.0000 mg | ORAL_CAPSULE | Freq: Every day | ORAL | Status: DC
  Administered 2012-03-06 – 2012-03-07 (×2): 180 mg via ORAL
  Filled 2012-03-06 (×3): qty 1

## 2012-03-06 MED ORDER — WARFARIN SODIUM 4 MG PO TABS
4.0000 mg | ORAL_TABLET | Freq: Once | ORAL | Status: AC
  Administered 2012-03-06: 4 mg via ORAL
  Filled 2012-03-06: qty 1

## 2012-03-06 NOTE — Progress Notes (Signed)
Pt was able to void spontaneously 150 ml; bladder scan indicates 200 ml remains in her bladder after the void; NP Claiborne Billings notified of low urine output; orders received for in and out cath; will continue to monitor

## 2012-03-06 NOTE — Progress Notes (Signed)
Unit of prbc's finished. Pt tol without event.  NO c/o pain or discomfort. Continue to watch

## 2012-03-06 NOTE — Progress Notes (Signed)
    Pt is well known to Dr. Gala Romney.  She has atrial fib and and now has been found to have an embolus to a mesenteric artery.  I agree that we should try her again on coumadin given her risks of further embolization.  The decision for long term coumadin can be addressed by Dr. [name] the GI team.    Alvia Grove., MD, [facility] 03/06/2012, 2:19 PM Office - [phone] Pager [phone]

## 2012-03-06 NOTE — Progress Notes (Addendum)
[facility] [place]  Post-op  Intra-abdominal Surgery note  Date of Surgery: 03/02/2012 - 03/03/2012 Surgeon: Moishe Spice): Chuck Hint, MD POD: 3 Days Post-Op Procedure(s): MESENTERIC ARTERY BYPASS  History of Present Illness  Lisa Douglas is a 78 y.o. female who is  up s/p Procedure(s): MESENTERIC ARTERY BYPASS Pt is doing well, ambulating . denies incisional pain; denies nausea/vomiting; denies diarrhea. has not had flatus;has not had BM  IMAGING: No results found.  Significant Diagnostic Studies: CBC Lab Results  Component Value Date   WBC 9.0 03/06/2012   HGB 8.2* 03/06/2012   HCT 25.5* 03/06/2012   MCV 97.7 03/06/2012   PLT 177 03/06/2012    BMET    Component Value Date/Time   NA 139 03/05/2012 0430   K 4.0 03/05/2012 0430   CL 109 03/05/2012 0430   CO2 22 03/05/2012 0430   GLUCOSE 141* 03/05/2012 0430   BUN 9 03/05/2012 0430   CREATININE 0.52 03/05/2012 0430   CALCIUM 7.9* 03/05/2012 0430   GFRNONAA 87* 03/05/2012 0430   GFRAA >90 03/05/2012 0430    COAG Lab Results  Component Value Date   INR 1.20 03/05/2012   INR 1.38 03/03/2012   INR 1.05 03/03/2012   No results found for this basename: PTT    I/O last 3 completed shifts: In: 3369.6 [I.V.:3299.6; NG/GT:70] Out: 1235 [Urine:1075; Emesis/NG output:160]    Physical Examination BP Readings from Last 3 Encounters:  03/06/12 116/51  03/06/12 116/51  12/30/11 114/72   Temp Readings from Last 3 Encounters:  03/05/12 98.7 F (37.1 C) Oral  03/05/12 98.7 F (37.1 C) Oral  12/30/11 96.8 F (36 C) Oral   SpO2 Readings from Last 3 Encounters:  03/06/12 94%  03/06/12 94%  12/30/11 99%   Pulse Readings from Last 3 Encounters:  03/06/12 96  03/06/12 96  12/30/11 75    General: A&O x 3, WDWN female in NAD Pulmonary: normal non-labored breathing , without Rales, rhonchi,  wheezing Cardiac: Heart rate :A. Fib ,  Abdomen:abdomen soft, non-tender, normal active bowel sounds and  mildly distended Abdominal wound:clean, dry, intact  Neurologic: A&O X 3; Appropriate Affect ; SENSATION: normal; MOTOR FUNCTION:  moving all extremities equally. Speech is fluent/normal Vascular Exam:BLE warm  Extremities without ischemic changes, no Gangrene, no cellulitis; no open wounds;   Assessment/Plan: Lisa Douglas is a 78 y.o. female who is 3 Days Post-Op Procedure(s): MESENTERIC ARTERY BYPASS Still on diltiazem drip - convert to po per med sx - pt takes at home Large drop in H/H overnight with no apparent source of bleeding - will repeat Continue Heparin - convert to coumadin; hep level 0.26 Pt with positive belching but no flatus or BM - cont NPO except sips with meds   Douglas,Lisa J [phone] 03/06/2012 7:17 AM  Pt is S/P SMA embolectomy likely secondary to embolic event related to A. Fib. Agree with heparin and Coumadin. Abd soft and min tenderness. Hypoactive BS Keep NPO until she has flatus.  Will try dulcolax supp.  Di Kindle. Edilia Bo, MD, FACS Beeper [phone] 03/06/2012

## 2012-03-06 NOTE — Progress Notes (Signed)
I agree with the following treatment note after reviewing documentation.   Johnston, Willia Lampert Brynn   OTR/L Pager: [phone] Office: [phone] .   

## 2012-03-06 NOTE — Progress Notes (Signed)
Utilization review completed.  

## 2012-03-06 NOTE — Progress Notes (Addendum)
ANTICOAGULATION CONSULT NOTE - Follow Up Consult  Pharmacy Consult for Heparin/coumadin Indication: SMA embolus s/p successful embolectomy; Hx Afib (not on chronic anticoagulation)  Allergies  Allergen Reactions  . Penicillins     angioedema  . Alendronate Sodium     REACTION: INTOL to Fosamax per pt.  . Atorvastatin     REACTION: INTOL to Lipitor per pt.   Labs:  Basename 03/06/12 0955 03/06/12 0415 03/05/12 1630 03/05/12 1503 03/05/12 0430 03/04/12 0426 03/03/12 2132  HGB 8.0* 8.2* -- -- -- -- --  HCT 24.2* 25.5* -- -- 35.5* -- --  PLT -- 177 -- -- 170 173 --  APTT -- -- -- -- -- -- 101*  LABPROT -- -- 15.5* -- -- -- --  INR -- -- 1.20 -- -- -- --  HEPARINUNFRC -- 0.26* -- 0.59 0.20* -- --  CREATININE -- -- -- -- 0.52 0.51 --  CKTOTAL -- -- -- -- -- -- --  CKMB -- -- -- -- -- -- --  TROPONINI -- -- -- -- -- -- --    Estimated Creatinine Clearance: 52.8 ml/min (by C-G formula based on Cr of 0.52).   Assessment: 53 yof admitted with SMA embolus on heparin s/p embolectomy. Patient also has h/o Afib but has not been on chronic anticoagulation.  Appears she is hesitant to take coumadin long-term d/t risk of bleeding. No bleeding noted per RN. No issues with line. Hgb dec from admission - continue to follow s/p PRBC this afternoon.  Repeat Heparin level reveals supratherapeutic level at 0.91,infusing at correct rate with appropriate lab draw - peripheral stick from L hand and heparin infusing R UE.Recheck of CBC reveals Hgb improved to 10.5.   Goal of Therapy:  INR=2-3 Heparin level 0.3-0.7 units/ml Monitor platelets by anticoagulation protocol: Yes   Plan:  1. Coumadin 4mg  PO x 1 tonight, patient ask we start with lower doses 2. Reduce Heparin gtt to 900 units/hr 3. Daily INR, heparin level and CBC  Juliette Alcide, PharmD, BCPS.  Pager: [phone] 03/06/2012,7:24 PM

## 2012-03-06 NOTE — Progress Notes (Signed)
Physical Therapy Treatment Patient Details Name: Lisa Douglas MRN: [id number] DOB: 12-21-1927 Today's Date: 03/06/2012 Time: 4098-1191 PT Time Calculation (min): 22 min  PT Assessment / Plan / Recommendation Comments on Treatment Session  Pt admitted s/p AAA repair and is progressing well with therapy.  Treatment limited today by a-fib with RVR from yesterday and pt on cardiazem drip today.  Strictly monitored HR throughout session with HR spiking into 160s with EOB activity only.  Tolerated sitting EOB and sit to/from stand only for repositioning purposes.    Follow Up Recommendations  Home health PT;Supervision/Assistance - 24 hour    Barriers to Discharge        Equipment Recommendations  None recommended by PT    Recommendations for Other Services    Frequency Min 3X/week   Plan Discharge plan remains appropriate;Frequency remains appropriate    Precautions / Restrictions Precautions Precautions: Fall Restrictions Weight Bearing Restrictions: No   Pertinent Vitals/Pain None.    Mobility  Bed Mobility Bed Mobility: Supine to Sit;Sit to Supine Supine to Sit: 1: +2 Total assist;HOB flat Supine to Sit: Patient Percentage: 60% Sit to Supine: 1: +2 Total assist;HOB flat Sit to Supine: Patient Percentage: 60% Details for Bed Mobility Assistance: Assist to translate trunk anterior over BOS and to slow descent to surface with cues for sequence.  Pt able to move bilateral LEs to EOB with assist to translate back onto bed. Transfers Transfers: Sit to Stand;Stand to Sit Sit to Stand: 4: Min assist Stand to Sit: 4: Min assist Details for Transfer Assistance: Assist for balance with cues for sequence and safety.  Only stood to reposition in bed while monitoring HR, which spiked as high as 160 at EOB.  Thus, OOB limited this am. Ambulation/Gait Ambulation/Gait Assistance: Not tested (comment) Stairs: No Wheelchair Mobility Wheelchair Mobility: No    Exercises     PT Diagnosis:     PT Problem List:   PT Treatment Interventions:     PT Goals Acute Rehab PT Goals PT Goal Formulation: With patient Time For Goal Achievement: 03/11/12 Potential to Achieve Goals: Fair PT Goal: Supine/Side to Sit - Progress: Progressing toward goal PT Goal: Sit to Supine/Side - Progress: Progressing toward goal PT Goal: Sit to Stand - Progress: Progressing toward goal PT Goal: Stand to Sit - Progress: Progressing toward goal  Visit Information  Last PT Received On: 03/06/12 Assistance Needed: +2 PT/OT Co-Evaluation/Treatment: Yes    Subjective Data  Subjective: "I never feel my heart beat."   Cognition  Overall Cognitive Status: Appears within functional limits for tasks assessed/performed Arousal/Alertness: Awake/alert Orientation Level: Appears intact for tasks assessed Behavior During Session: [facility] : [facility] for tasks performed    Balance  Balance Balance Assessed: Yes Static Sitting Balance Static Sitting - Balance Support: Bilateral upper extremity supported;Feet supported Static Sitting - Level of Assistance: 5: Stand by assistance Static Sitting - Comment/# of Minutes: Pt sat EOB for 5-10 minutes with min (guard) during bilateral UE functional activities within BOS.  Limited by HR spiking into 160s at EOB.    End of Session PT - End of Session Activity Tolerance: Patient tolerated treatment well;Treatment limited secondary to medical complications (Comment) (Pt on cardiazem, so HR strictly monitored.) Patient left: in bed;with call bell/phone within reach;with bed alarm set;with family/visitor present Nurse Communication: Mobility status   GP     Cephus Shelling 03/06/2012, 11:25 AM  03/06/2012 Cephus Shelling, PT, DPT [phone]

## 2012-03-06 NOTE — Progress Notes (Signed)
Occupational Therapy Evaluation Patient Details Name: KIJUANA RUPPEL MRN: [id number] DOB: [birth year] Today's Date: 03/06/2012 Time: 4098-1191 OT Time Calculation (min): 18 min  OT Assessment / Plan / Recommendation Clinical Impression  78 y.o. Pt presents s/p embolectomy of superior mesenteric artery thrombus. Pt. would benefit from OT to maximize independence and safety in ADLs prior to d/c. OT to follow acutely.     OT Assessment  Patient needs continued OT Services    Follow Up Recommendations  Home health OT;Supervision/Assistance - 24 hour    Barriers to Discharge      Equipment Recommendations   (TBD)    Recommendations for Other Services    Frequency  Min 2X/week    Precautions / Restrictions Precautions Precautions: Fall Restrictions Weight Bearing Restrictions: No   Pertinent Vitals/Pain HR spiked to 160 while sitting EOB after brushing hair. Pt. C/o pain in abdomen at end of session but did not quantify.    ADL  Grooming: Performed;Wash/dry face;Brushing hair;Set up Where Assessed - Grooming: Unsupported sitting ADL Comments: Pt. only sat EOB due to HR elevating in 160's. Pt. brushed hair/washed face with Setup A.     OT Diagnosis: Acute pain  OT Problem List: Decreased strength;Decreased activity tolerance;Impaired balance (sitting and/or standing);Cardiopulmonary status limiting activity;Pain OT Treatment Interventions: Self-care/ADL training;DME and/or AE instruction;Patient/family education;Balance training   OT Goals Acute Rehab OT Goals OT Goal Formulation: With patient Time For Goal Achievement: 03/20/12 Potential to Achieve Goals: Good ADL Goals Pt Will Perform Grooming: with modified independence;Standing at sink ADL Goal: Grooming - Progress: Goal set today Pt Will Perform Upper Body Bathing: with modified independence;Sitting at sink ADL Goal: Upper Body Bathing - Progress: Goal set today Pt Will Perform Lower Body Bathing: with modified  independence;Sit to stand from chair ADL Goal: Lower Body Bathing - Progress: Goal set today Pt Will Perform Upper Body Dressing: with modified independence;Sitting, bed;Sitting, chair ADL Goal: Upper Body Dressing - Progress: Goal set today Pt Will Perform Lower Body Dressing: with modified independence;Sit to stand from chair;Sit to stand from bed ADL Goal: Lower Body Dressing - Progress: Goal set today Pt Will Transfer to Toilet: with modified independence;Ambulation;Regular height toilet ADL Goal: Toilet Transfer - Progress: Goal set today Pt Will Perform Toileting - Clothing Manipulation: with modified independence;Sitting on 3-in-1 or toilet ADL Goal: Toileting - Clothing Manipulation - Progress: Goal set today Pt Will Perform Toileting - Hygiene: with modified independence;Leaning right and/or left on 3-in-1/toilet ADL Goal: Toileting - Hygiene - Progress: Goal set today  Visit Information  Last OT Received On: 03/06/12 Assistance Needed: +2 PT/OT Co-Evaluation/Treatment: Yes    Subjective Data   When HR elevated, pt.was asked if she could feel her heart beat fast, and she said she could not.   Prior Functioning  Vision/Perception  Home Living Lives With: Alone Available Help at Discharge: Family;Available 24 hours/day Type of Home: House Home Access: Stairs to enter [employer] of Steps: 3 Entrance Stairs-Rails: Can reach both Home Layout: Two level Alternate Level Stairs-Number of Steps: 12 Alternate Level Stairs-Rails: Can reach both Bathroom Shower/Tub: Tub/shower unit;Curtain Firefighter: Standard Bathroom Accessibility: Yes How Accessible: Accessible via walker Home Adaptive Equipment: Bedside commode/3-in-1;Walker - rolling;Straight cane Prior Function Level of Independence: Independent Able to Take Stairs?: Yes Driving: Yes Vocation: Retired Musician: No difficulties Dominant Hand: Right      Cognition  Overall  Cognitive Status: Appears within functional limits for tasks assessed/performed Arousal/Alertness: Awake/alert Orientation Level: Appears intact for tasks assessed Behavior During  Session: [facility] for tasks performed    Extremity/Trunk Assessment Right Upper Extremity Assessment RUE ROM/Strength/Tone: [facility] for tasks assessed Left Upper Extremity Assessment LUE ROM/Strength/Tone: [facility] for tasks assessed   Mobility Bed Mobility Bed Mobility: Supine to Sit;Sit to Supine Supine to Sit: 1: +2 Total assist Supine to Sit: Patient Percentage: 60% Sit to Supine: 1: +2 Total assist Sit to Supine: Patient Percentage: 60% Details for Bed Mobility Assistance: Assist to translate trunk anterior over BOS and to slow descent to surface with cues for sequence.  Pt able to move bilateral LEs to EOB with assist to translate back onto bed. Transfers Sit to Stand: 4: Min assist Stand to Sit: 4: Min assist Details for Transfer Assistance: Assist for balance with cues for sequence and safety.  Only stood to reposition in bed while monitoring HR, which spiked as high as 160 at EOB.  Thus, OOB limited this am.         End of Session OT - End of Session Activity Tolerance: Patient tolerated treatment well;Treatment limited secondary to medical complications (Comment) ((Pt on cardiazem, so HR strictly monitored.)) Patient left: in bed;with call bell/phone within reach;with family/visitor present  GO     Jenell Milliner 03/06/2012, 1:47 PM

## 2012-03-06 NOTE — Progress Notes (Signed)
ANTICOAGULATION CONSULT NOTE - Follow Up Consult  Pharmacy Consult for Heparin Indication: SMA embolus s/p successful embolectomy; Hx Afib (not on chronic anticoagulation)  Allergies  Allergen Reactions  . Penicillins     angioedema  . Alendronate Sodium     REACTION: INTOL to Fosamax per pt.  . Atorvastatin     REACTION: INTOL to Lipitor per pt.   Labs:  Basename 03/06/12 0415 03/05/12 1630 03/05/12 1503 03/05/12 0430 03/04/12 0426 03/03/12 2132 03/03/12 1750  HGB 8.2* -- -- 11.3* -- -- --  HCT 25.5* -- -- 35.5* 35.2* -- --  PLT 177 -- -- 170 173 -- --  APTT -- -- -- -- -- 101* >200*  LABPROT -- 15.5* -- -- -- -- 17.2*  INR -- 1.20 -- -- -- -- 1.38  HEPARINUNFRC 0.26* -- 0.59 0.20* -- -- --  CREATININE -- -- -- 0.52 0.51 -- 0.48*  CKTOTAL -- -- -- -- -- -- --  CKMB -- -- -- -- -- -- --  TROPONINI -- -- -- -- -- -- --    Estimated Creatinine Clearance: 52.8 ml/min (by C-G formula based on Cr of 0.52).   Assessment: 89 yof admitted with SMA embolus on heparin s/p embolectomy. Patient also has h/o Afib but has not been on chronic anticoagulation.  No bleeding noted per RN. No issues with line. Hgb down -? due to ABLA - follow.  Heparin level = 0.26, subtherapeutic  Goal of Therapy:  Heparin level 0.3-0.7 units/ml Monitor platelets by anticoagulation protocol: Yes   Plan:  1. Increase heparin to 1050 units/hr. 2. F/u 8 hr heparin level  Christoper Fabian, PharmD, BCPS Clinical pharmacist, pager [phone] 03/06/2012,4:59 AM

## 2012-03-06 NOTE — Progress Notes (Signed)
[facility] Progress Note [place] TEAM 1 - Stepdown/ICU TEAM   Lisa Douglas WJX:[id number] DOB: 08/21/1928 DOA: 03/02/2012 PCP: Michele Mcalpine, MD  Assessment/Plan:  Acute Superior mesenteric artery thrombosis Felt to likely represent embolic phenomenon related to atrial fibrillation in that there were no preceding symptoms consistent with gut claudication - now status post embolectomy of the superior mesenteric artery with bovine pericardial patch angioplasty of superior mesenteric artery - remains on full anticoagulation (heparin >> coumadin) - pt has been reluctant to resume coumadin, so I asked that her Cardiologist come by to help reinforce the need for anticoag  Acute blood loss anemia likely due to expected blood loss from surgery, but must keep GI bleeding in mind given known AVMs and lack of bowel movement thus far - agree with transfusion as ordered by Vasc - goal should be to keep Hgb > 8 consistently  ATRIAL FIBRILLATION, CHRONIC a transthoracic echo reveals no evidence of an intracardiac thrombus - RVR has been an issue - HR is now controlled - will wean off cardizem gtt and plan to transfer to a tele bed if HF proves to remain stable  HYPERLIPIDEMIA Resume treatment when able to take oral meds/food consistently  Possible right lung nodule Will consider CT of the chest once the patient is hydrated and more stable  ANGIODYSPLASIA, COLON with prior history of GI bleeding Patient was previously taken off of Coumadin due to known AVMs (pt now states she never actually had a GIB, but was simply found to have AVMs) - at this time it would appear we have little choice but to continue anticoagulation - we will need to arrange for f/u in the [facility] at the time of her d/c   Code Status: Full Family Communication: communicated with the patient and multiple family members at the bedside Disposition Plan: Remain in step down unit until heart rate/A. fib better  controlled and Hgb proven to be stable  Brief narrative: 78 y.o. female with history of chronic atrial fibrillation (not on full anticoagulation due to AVM), anxiety, GERD, and hyperlipidemia, who presented to the emergency room complaining of the acute onset of abdominal pain for 2 days, accompanied with nausea and vomiting along with watery diarrhea. She denied any recent antibiotic. She denied black stool or bloody stool. She denied any fever or chills. Evaluation in emergency room included an abdominal pelvic CT which revealed a superior mesenteric artery non-occlusive thrombus, but no evidence of ischemic bowel (along with incidental finding of a small pulmonary nodule and calcification in the aorta).  Consultants: Vascular surgery GI - telephone consultation [place] Cards - bedside conversation/reassurance  Procedures: 03/03/2012 - embolectomy of the superior mesenteric artery with bovine pericardial patch angioplasty of superior mesenteric artery   03/04/2012 2D Echo - no evidence of intracardiac thrombus - preserved LV systolic fxn  Antibiotics: None   HPI/Subjective: Not yet moving her bowels.  C/o feeling some "fullness" of the abdom.  Denies n/v, sob, or chest pain.   Objective: Blood pressure 103/53, pulse 83, temperature 97.5 F (36.4 C), temperature source Oral, resp. rate 20, height 5\' 5"  (1.651 m), weight 68.8 kg (151 lb 10.8 oz), SpO2 94.00%.  Intake/Output Summary (Last 24 hours) at 03/06/12 1449 Last data filed at 03/06/12 1400  Gross per 24 hour  Intake 2196.61 ml  Output    500 ml  Net 1696.61 ml     Exam: General: No acute respiratory distress Lungs: Clear to auscultation bilaterally without wheezes or crackles  Cardiovascular: Irregularly irregular without gallop or rub - rate controlled Abdomen: Midline incision well approximated and dry, bowel sounds faint but present, mildly tender to soft palpation throughout, nondistended, no rebound Extremities: No  significant cyanosis, clubbing, or edema bilateral lower extremities  Data Reviewed: Basic Metabolic Panel:  Lab 03/05/12 1610 03/04/12 0426 03/03/12 1750 03/02/12 2340  NA 139 139 134* 136  K 4.0 3.9 3.2* 4.1  CL 109 108 102 100  CO2 22 23 20 23   GLUCOSE 141* 141* 152* 141*  BUN 9 11 12 21   CREATININE 0.52 0.51 0.48* 0.85  CALCIUM 7.9* 7.7* 7.3* 9.6  MG 1.9 1.6 1.4* --  PHOS -- -- -- --   Liver Function Tests:  Lab 03/04/12 0426 03/02/12 2340  AST 28 33  ALT 18 19  ALKPHOS 46 71  BILITOT 0.4 0.6  PROT 5.4* 7.5  ALBUMIN 2.7* 4.2    Lab 03/04/12 0426 03/02/12 2340  LIPASE -- 25  AMYLASE 30 --   CBC:  Lab 03/06/12 0955 03/06/12 0415 03/05/12 0430 03/04/12 0426 03/03/12 1750 03/02/12 2340  WBC -- 9.0 10.0 11.9* 11.6* 13.8*  NEUTROABS -- -- -- -- -- 12.1*  HGB 8.0* 8.2* 11.3* 11.6* 12.8 --  HCT 24.2* 25.5* 35.5* 35.2* 37.7 --  MCV -- 97.7 97.0 95.1 94.5 94.4  PLT -- 177 170 173 156 225   Cardiac Enzymes:  Lab 03/02/12 2340  CKTOTAL --  CKMB --  CKMBINDEX --  TROPONINI <0.30    Recent Results (from the past 240 hour(s))  MRSA PCR SCREENING     Status: Normal   Collection Time   03/03/12  8:54 PM      Component Value Range Status Comment   MRSA by PCR NEGATIVE  NEGATIVE Final      Studies:  Recent x-ray studies have been reviewed in detail by the Attending Physician  Scheduled Meds:  Reviewed in detail by the Attending Physician   Lonia Blood, MD [facility] Office  [phone] Pager [phone]  On-Call/Text Page:      [email or web address]      password [email or web address]  If 7PM-7AM, please contact night-coverage [email or web address] Password [email or web address] 03/06/2012, 2:49 PM   LOS: 4 days

## 2012-03-07 ENCOUNTER — Inpatient Hospital Stay (HOSPITAL_COMMUNITY): Payer: Medicare Other

## 2012-03-07 ENCOUNTER — Encounter (HOSPITAL_COMMUNITY)
Admission: EM | Disposition: A | Discharge status: Home Health | Payer: Self-pay | Source: Home / Self Care | Attending: Internal Medicine

## 2012-03-07 ENCOUNTER — Encounter (HOSPITAL_COMMUNITY): Payer: Self-pay | Admitting: Certified Registered Nurse Anesthetist

## 2012-03-07 ENCOUNTER — Inpatient Hospital Stay (HOSPITAL_COMMUNITY): Payer: Medicare Other | Admitting: Certified Registered Nurse Anesthetist

## 2012-03-07 DIAGNOSIS — IMO0002 Reserved for concepts with insufficient information to code with codable children: Secondary | ICD-10-CM

## 2012-03-07 HISTORY — PX: LAPAROTOMY: SHX154

## 2012-03-07 LAB — BASIC METABOLIC PANEL
BUN: 12 mg/dL (ref 6–23)
CO2: 25 mEq/L (ref 19–32)
Calcium: 7.5 mg/dL — ABNORMAL LOW (ref 8.4–10.5)
Chloride: 111 mEq/L (ref 96–112)
Chloride: 107 mEq/L (ref 96–112)
Creatinine, Ser: 0.54 mg/dL (ref 0.50–1.10)
Creatinine, Ser: 0.49 mg/dL — ABNORMAL LOW (ref 0.50–1.10)
GFR calc Af Amer: >90 mL/min (ref >90)
GFR calc Af Amer: >90 mL/min (ref >90)
GFR calc non Af Amer: 88 mL/min — ABNORMAL LOW (ref >90)
Potassium: 4 mEq/L (ref 3.5–5.1)
Sodium: 144 mEq/L (ref 135–145)

## 2012-03-07 LAB — PROTIME-INR
INR: 1.3 (ref 0.00–1.49)
Prothrombin Time: 16.4 seconds — ABNORMAL HIGH (ref 11.6–15.2)
Prothrombin Time: 22.1 seconds — ABNORMAL HIGH (ref 11.6–15.2)

## 2012-03-07 LAB — CBC
HCT: 24 % — ABNORMAL LOW (ref 36.0–46.0)
Hemoglobin: 9.8 g/dL — ABNORMAL LOW (ref 12.0–15.0)
MCH: 31.3 pg (ref 26.0–34.0)
MCHC: 33.3 g/dL (ref 30.0–36.0)
MCV: 93.8 fL (ref 78.0–100.0)
Platelets: 205 10*3/uL (ref 150–400)
Platelets: 207 10*3/uL (ref 150–400)
Platelets: 209 10*3/uL (ref 150–400)
RBC: 2.75 MIL/uL — ABNORMAL LOW (ref 3.87–5.11)
RBC: 3.13 MIL/uL — ABNORMAL LOW (ref 3.87–5.11)
RDW: 15.8 % — ABNORMAL HIGH (ref 11.5–15.5)
RDW: 15.7 % — ABNORMAL HIGH (ref 11.5–15.5)
WBC: 10.3 10*3/uL (ref 4.0–10.5)
WBC: 10.8 10*3/uL — ABNORMAL HIGH (ref 4.0–10.5)

## 2012-03-07 LAB — MAGNESIUM: Magnesium: 1.7 mg/dL (ref 1.5–2.5)

## 2012-03-07 SURGERY — LAPAROTOMY, EXPLORATORY
Anesthesia: General | Site: Abdomen | Wound class: Clean

## 2012-03-07 MED ORDER — GLYCOPYRROLATE 0.2 MG/ML IJ SOLN
INTRAMUSCULAR | Status: DC | PRN
  Administered 2012-03-07: 0.6 mg via INTRAVENOUS

## 2012-03-07 MED ORDER — ROCURONIUM BROMIDE 100 MG/10ML IV SOLN
INTRAVENOUS | Status: DC | PRN
  Administered 2012-03-07: 30 mg via INTRAVENOUS

## 2012-03-07 MED ORDER — ONDANSETRON HCL 4 MG/2ML IJ SOLN
INTRAMUSCULAR | Status: DC | PRN
  Administered 2012-03-07: 4 mg via INTRAVENOUS

## 2012-03-07 MED ORDER — MAGNESIUM SULFATE 40 MG/ML IJ SOLN
2.0000 g | Freq: Once | INTRAMUSCULAR | Status: AC | PRN
  Filled 2012-03-07: qty 50

## 2012-03-07 MED ORDER — POTASSIUM CHLORIDE CRYS ER 20 MEQ PO TBCR: 20.0000 meq | EXTENDED_RELEASE_TABLET | Freq: Once | ORAL | Status: AC | PRN

## 2012-03-07 MED ORDER — WARFARIN SODIUM 2.5 MG PO TABS
2.5000 mg | ORAL_TABLET | Freq: Once | ORAL | Status: DC
  Filled 2012-03-07: qty 1

## 2012-03-07 MED ORDER — HYDROMORPHONE HCL PF 1 MG/ML IJ SOLN
INTRAMUSCULAR | Status: AC
  Administered 2012-03-07: 0.5 mg via INTRAVENOUS
  Filled 2012-03-07: qty 1

## 2012-03-07 MED ORDER — CEFAZOLIN SODIUM 1-5 GM-% IV SOLN
INTRAVENOUS | Status: AC
  Filled 2012-03-07: qty 100

## 2012-03-07 MED ORDER — PHENOL 1.4 % MT LIQD: 1.0000 | OROMUCOSAL | Status: DC | PRN

## 2012-03-07 MED ORDER — NEOSTIGMINE METHYLSULFATE 1 MG/ML IJ SOLN
INTRAMUSCULAR | Status: DC | PRN
  Administered 2012-03-07: 3 mg via INTRAVENOUS

## 2012-03-07 MED ORDER — LABETALOL HCL 5 MG/ML IV SOLN
INTRAVENOUS | Status: AC
  Administered 2012-03-07: 5 mg via INTRAVENOUS
  Filled 2012-03-07: qty 4

## 2012-03-07 MED ORDER — SUCCINYLCHOLINE CHLORIDE 20 MG/ML IJ SOLN
INTRAMUSCULAR | Status: DC | PRN
  Administered 2012-03-07: 120 mg via INTRAVENOUS

## 2012-03-07 MED ORDER — CEFAZOLIN SODIUM 1-5 GM-% IV SOLN
INTRAVENOUS | Status: DC | PRN
  Administered 2012-03-07: 1 g via INTRAVENOUS

## 2012-03-07 MED ORDER — ONDANSETRON HCL 4 MG/2ML IJ SOLN: 4.0000 mg | Freq: Once | INTRAMUSCULAR | Status: DC | PRN

## 2012-03-07 MED ORDER — POTASSIUM CHLORIDE 20 MEQ/15ML (10%) PO LIQD
40.0000 meq | Freq: Once | ORAL | Status: AC
  Administered 2012-03-07: 40 meq via ORAL
  Filled 2012-03-07: qty 30

## 2012-03-07 MED ORDER — SODIUM CHLORIDE 0.9 % IV SOLN: 500.0000 mL | Freq: Once | INTRAVENOUS | Status: AC | PRN

## 2012-03-07 MED ORDER — PHENYLEPHRINE HCL 10 MG/ML IJ SOLN
INTRAMUSCULAR | Status: DC | PRN
  Administered 2012-03-07 (×3): 80 ug via INTRAVENOUS

## 2012-03-07 MED ORDER — PHENYLEPHRINE HCL 10 MG/ML IJ SOLN
10.0000 mg | INTRAVENOUS | Status: DC | PRN
  Administered 2012-03-07: 10 ug/min via INTRAVENOUS

## 2012-03-07 MED ORDER — LACTATED RINGERS IV SOLN
INTRAVENOUS | Status: DC | PRN
  Administered 2012-03-07: 18:00:00 via INTRAVENOUS

## 2012-03-07 MED ORDER — HYDROMORPHONE HCL PF 1 MG/ML IJ SOLN
0.2500 mg | INTRAMUSCULAR | Status: DC | PRN
  Administered 2012-03-07 (×2): 0.5 mg via INTRAVENOUS

## 2012-03-07 MED ORDER — 0.9 % SODIUM CHLORIDE (POUR BTL) OPTIME
TOPICAL | Status: DC | PRN
  Administered 2012-03-07: 2000 mL

## 2012-03-07 MED ORDER — KCL IN DEXTROSE-NACL 20-5-0.9 MEQ/L-%-% IV SOLN
INTRAVENOUS | Status: DC
  Administered 2012-03-07 – 2012-03-08 (×2): via INTRAVENOUS
  Administered 2012-03-08 (×2): 75 mL/h via INTRAVENOUS
  Administered 2012-03-09 – 2012-03-10 (×2): via INTRAVENOUS
  Filled 2012-03-07 (×10): qty 1000

## 2012-03-07 MED ORDER — POTASSIUM CHLORIDE 20 MEQ/15ML (10%) PO LIQD
ORAL | Status: AC
  Administered 2012-03-07: 20 meq
  Filled 2012-03-07: qty 15

## 2012-03-07 MED ORDER — LABETALOL HCL 5 MG/ML IV SOLN
10.0000 mg | INTRAVENOUS | Status: DC | PRN
  Administered 2012-03-07: 5 mg via INTRAVENOUS

## 2012-03-07 MED ORDER — DOPAMINE-DEXTROSE 3.2-5 MG/ML-% IV SOLN: 3.0000 ug/kg/min | INTRAVENOUS | Status: DC | PRN

## 2012-03-07 MED ORDER — LIDOCAINE HCL (CARDIAC) 20 MG/ML IV SOLN
INTRAVENOUS | Status: DC | PRN
  Administered 2012-03-07: 100 mg via INTRAVENOUS

## 2012-03-07 MED ORDER — METOPROLOL TARTRATE 1 MG/ML IV SOLN
2.0000 mg | INTRAVENOUS | Status: DC | PRN
  Administered 2012-03-08: 5 mg via INTRAVENOUS
  Filled 2012-03-07: qty 5

## 2012-03-07 MED ORDER — PANTOPRAZOLE SODIUM 40 MG IV SOLR
40.0000 mg | Freq: Every day | INTRAVENOUS | Status: DC
  Administered 2012-03-07 – 2012-03-08 (×2): 40 mg via INTRAVENOUS
  Filled 2012-03-07 (×3): qty 40

## 2012-03-07 MED ORDER — HYDRALAZINE HCL 20 MG/ML IJ SOLN: 10.0000 mg | INTRAMUSCULAR | Status: DC | PRN

## 2012-03-07 MED ORDER — PROPOFOL 10 MG/ML IV EMUL
INTRAVENOUS | Status: DC | PRN
  Administered 2012-03-07: 80 mg via INTRAVENOUS

## 2012-03-07 MED ORDER — FENTANYL CITRATE 0.05 MG/ML IJ SOLN
INTRAMUSCULAR | Status: DC | PRN
  Administered 2012-03-07: 125 ug via INTRAVENOUS

## 2012-03-07 MED ORDER — POTASSIUM CHLORIDE 10 MEQ/100ML IV SOLN
10.0000 meq | INTRAVENOUS | Status: AC
  Administered 2012-03-07 (×4): 10 meq via INTRAVENOUS
  Filled 2012-03-07 (×4): qty 100

## 2012-03-07 SURGICAL SUPPLY — 55 items
CANISTER SUCTION 2500CC (MISCELLANEOUS) ×3 IMPLANT
CANNULA VESSEL W/WING WO/VALVE (CANNULA) ×3 IMPLANT
CLIP TI MEDIUM 24 (CLIP) ×3 IMPLANT
CLIP TI WIDE RED SMALL 24 (CLIP) ×3 IMPLANT
CLOTH BEACON ORANGE TIMEOUT ST (SAFETY) ×3 IMPLANT
COVER SURGICAL LIGHT HANDLE (MISCELLANEOUS) ×3 IMPLANT
DRAPE WARM FLUID 44X44 (DRAPE) ×3 IMPLANT
DRSG COVADERM 4X8 (GAUZE/BANDAGES/DRESSINGS) ×3 IMPLANT
ELECT BLADE 4.0 EZ CLEAN MEGAD (MISCELLANEOUS) ×3
ELECT BLADE 6.5 EXT (BLADE) IMPLANT
ELECT REM PT RETURN 9FT ADLT (ELECTROSURGICAL) ×3
ELECTRODE BLDE 4.0 EZ CLN MEGD (MISCELLANEOUS) ×2 IMPLANT
ELECTRODE REM PT RTRN 9FT ADLT (ELECTROSURGICAL) ×2 IMPLANT
GLOVE BIO SURGEON STRL SZ 6.5 (GLOVE) ×3 IMPLANT
GLOVE BIO SURGEON STRL SZ7.5 (GLOVE) ×3 IMPLANT
GLOVE BIOGEL PI IND STRL 6.5 (GLOVE) ×2 IMPLANT
GLOVE BIOGEL PI IND STRL 7.0 (GLOVE) ×2 IMPLANT
GLOVE BIOGEL PI IND STRL 7.5 (GLOVE) ×2 IMPLANT
GLOVE BIOGEL PI INDICATOR 6.5 (GLOVE) ×1
GLOVE BIOGEL PI INDICATOR 7.0 (GLOVE) ×1
GLOVE BIOGEL PI INDICATOR 7.5 (GLOVE) ×1
GOWN STRL NON-REIN LRG LVL3 (GOWN DISPOSABLE) ×15 IMPLANT
INSERT FOGARTY 61MM (MISCELLANEOUS) ×3 IMPLANT
INSERT FOGARTY SM (MISCELLANEOUS) ×6 IMPLANT
KIT BASIN OR (CUSTOM PROCEDURE TRAY) ×3 IMPLANT
KIT ROOM TURNOVER OR (KITS) ×3 IMPLANT
NS IRRIG 1000ML POUR BTL (IV SOLUTION) ×6 IMPLANT
PACK AORTA (CUSTOM PROCEDURE TRAY) ×3 IMPLANT
PAD ARMBOARD 7.5X6 YLW CONV (MISCELLANEOUS) ×6 IMPLANT
RETAINER VISCERA MED (MISCELLANEOUS) ×3 IMPLANT
SPECIMEN JAR MEDIUM (MISCELLANEOUS) ×3 IMPLANT
SPONGE SURGIFOAM ABS GEL 100 (HEMOSTASIS) IMPLANT
STAPLER VISISTAT 35W (STAPLE) ×6 IMPLANT
SUT ETHIBOND 5 LR DA (SUTURE) IMPLANT
SUT PDS AB 1 TP1 54 (SUTURE) ×6 IMPLANT
SUT PROLENE 2 0 MH 48 (SUTURE) IMPLANT
SUT PROLENE 3 0 SH 48 (SUTURE) IMPLANT
SUT PROLENE 3 0 SH1 36 (SUTURE) IMPLANT
SUT PROLENE 5 0 C 1 24 (SUTURE) ×6 IMPLANT
SUT PROLENE 5 0 C 1 36 (SUTURE) IMPLANT
SUT PROLENE 6 0 BV (SUTURE) ×6 IMPLANT
SUT SILK 2 0 (SUTURE) ×1
SUT SILK 2 0 SH CR/8 (SUTURE) ×6 IMPLANT
SUT SILK 2-0 18XBRD TIE 12 (SUTURE) ×2 IMPLANT
SUT SILK 3 0 (SUTURE) ×1
SUT SILK 3-0 18XBRD TIE 12 (SUTURE) ×2 IMPLANT
SUT VIC AB 2-0 CTB1 (SUTURE) ×6 IMPLANT
SUT VIC AB 3-0 SH 27 (SUTURE) ×2
SUT VIC AB 3-0 SH 27X BRD (SUTURE) ×4 IMPLANT
SUT VICRYL 4-0 PS2 18IN ABS (SUTURE) ×6 IMPLANT
TOWEL BLUE STERILE X RAY DET (MISCELLANEOUS) ×6 IMPLANT
TOWEL OR 17X24 6PK STRL BLUE (TOWEL DISPOSABLE) ×6 IMPLANT
TOWEL OR 17X26 10 PK STRL BLUE (TOWEL DISPOSABLE) ×6 IMPLANT
TRAY FOLEY CATH 14FRSI W/METER (CATHETERS) IMPLANT
WATER STERILE IRR 1000ML POUR (IV SOLUTION) ×6 IMPLANT

## 2012-03-07 NOTE — Progress Notes (Addendum)
Lisa Douglas is a 78 y.o. 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 She is now s/p embolectomy with bovine pericardial patch angioplasty. She is being anticoagulated for the afib. However, she keeps dropping her hemoglobin15.5 on admission, now 8 g/dl today, even after a unit of blood yesterday. Worry is for intraabdominal bleed. She has history of avms but no overt gi bleed noted so far. Appreciate cardiology/vvs. Feels ok. Had some back pain earlier and attributes it to the small bed.   1. Abdominal pain   2. Atrial fibrillation   3. Superior mesenteric artery thrombosis   4. Angiodysplasia of intestine (without mention of hemorrhage)     Past Medical History  Diagnosis Date  . Macular degeneration (senile) of retina, unspecified   . Atrial fibrillation   . Unspecified venous (peripheral) insufficiency   . Other and unspecified hyperlipidemia   . Esophageal reflux   . Diverticulosis of colon (without mention of hemorrhage)   . Angiodysplasia of intestine (without mention of hemorrhage)   . Osteoarthrosis, unspecified whether generalized or localized, unspecified site   . Osteoporosis, unspecified   . Anxiety state, unspecified    Current Facility-Administered Medications  Medication Dose Route Frequency Provider Last Rate Last Dose  . acetaminophen (TYLENOL) suppository 325-650 mg  325-650 mg Rectal Q4H PRN Ames Coupe Rhyne, PA   650 mg at 03/05/12 1415  . acetaminophen (TYLENOL) tablet 650 mg  650 mg Oral Q6H PRN Lonia Blood, MD   650 mg at 03/07/12 4098  . ALPRAZolam Prudy Feeler) tablet 0.25 mg  0.25 mg Oral TID PRN Lonia Blood, MD      . bisacodyl (DULCOLAX) suppository 10 mg  10 mg Rectal Daily PRN Chuck Hint, MD   10 mg at 03/06/12 1204  . dextrose 5 % and 0.9 % NaCl with KCl 20 mEq/L infusion   Intravenous Continuous Lonia Blood, MD 50 mL/hr at 03/06/12 1837 50 mL/hr at  03/06/12 1837  . diltiazem (CARDIZEM CD) 24 hr capsule 180 mg  180 mg Oral Daily Lonia Blood, MD   180 mg at 03/07/12 1045  . diltiazem (CARDIZEM) 100 mg in dextrose 5 % 100 mL infusion  5-15 mg/hr Intravenous Titrated Lonia Blood, MD 5 mL/hr at 03/06/12 1500 5 mg/hr at 03/06/12 1500  . heparin ADULT infusion 100 units/mL (25000 units/250 mL)  900 Units/hr Intravenous Continuous Dannielle Huh, PHARMD 9 mL/hr at 03/06/12 2015 900 Units/hr at 03/06/12 2015  . HYDROmorphone (DILAUDID) injection 1 mg  1 mg Intravenous Q2H PRN Houston Siren, MD   1 mg at 03/07/12 0718  . labetalol (NORMODYNE,TRANDATE) injection 10 mg  10 mg Intravenous Q2H PRN Ames Coupe Rhyne, PA      . metoprolol tartrate (LOPRESSOR) tablet 25 mg  25 mg Oral BID Lonia Blood, MD   25 mg at 03/07/12 1045  . ondansetron (ZOFRAN) injection 4 mg  4 mg Intravenous Q6H PRN Houston Siren, MD   4 mg at 03/03/12 1123  . ondansetron (ZOFRAN) injection 4 mg  4 mg Intravenous Q6H PRN Samantha J Rhyne, PA      . phenol (CHLORASEPTIC) mouth spray 1 spray  1 spray Mouth/Throat PRN Samantha J Rhyne, PA      . potassium chloride 10 mEq in 100 mL IVPB  10 mEq Intravenous Q1 Hr x 4 Regina Shela Commons [place], [place]   10 mEq at 03/07/12 1201  .  potassium chloride 20 MEQ/15ML (10%) liquid 40 mEq  40 mEq Oral Once Davionna Blacksher, MD   40 mEq at 03/07/12 1329  . potassium chloride 20 MEQ/15ML (10%) liquid        20 mEq at 03/07/12 1330  . simvastatin (ZOCOR) tablet 20 mg  20 mg Oral q1800 Lonia Blood, MD   20 mg at 03/06/12 1832  . sodium chloride (OCEAN) 0.65 % nasal spray 1 spray  1 spray Each Nare PRN Lonia Blood, MD   1 spray at 03/06/12 [phone]  . warfarin (COUMADIN) tablet 2.5 mg  2.5 mg Oral ONCE-1800 Lonia Blood, MD      . warfarin (COUMADIN) tablet 4 mg  4 mg Oral Once Dannielle Huh, PHARMD   4 mg at 03/06/12 2059  . Warfarin - Pharmacist Dosing Inpatient   Does not apply q1800 Lonia Blood, MD      . DISCONTD: aspirin  EC tablet 81 mg  81 mg Oral Daily Lonia Blood, MD   81 mg at 03/06/12 1048  . DISCONTD: heparin ADULT infusion 100 units/mL (25000 units/250 mL)  1,050 Units/hr Intravenous Continuous Hilario Quarry Amend, PHARMD 10.5 mL/hr at 03/06/12 1900 1,050 Units/hr at 03/06/12 1900   Allergies  Allergen Reactions  . Penicillins     angioedema  . Alendronate Sodium     REACTION: INTOL to Fosamax per pt.  . Atorvastatin     REACTION: INTOL to Lipitor per pt.   Principal Problem:  *Superior mesenteric artery thrombosis Active Problems:  HYPERLIPIDEMIA  ATRIAL FIBRILLATION, CHRONIC  ANGIODYSPLASIA, COLON  Abdominal pain, acute  Anemia   Vital signs in last 24 hours: Temp:  [97.5 F (36.4 C)-98.9 F (37.2 C)] 97.6 F (36.4 C) (07/16 1200) Pulse Rate:  [94-104] 95  (07/16 1300) Resp:  [19-21] 21  (07/16 0452) BP: (112-135)/(45-84) 112/54 mmHg (07/16 1200) SpO2:  [94 %-95 %] 95 % (07/16 0452) Weight change:  Last BM Date: 03/06/12  Intake/Output from previous day: 07/15 0701 - 07/16 0700 In: 2006.7 [P.O.:50; I.V.:1956.7] Out: 1450 [Urine:1450] Intake/Output this shift: Total I/O In: 654 [P.O.:300; I.V.:354] Out: -   Lab Results:  Basename 03/07/12 1339 03/07/12 0421  WBC 11.3* 10.3  HGB 8.0* 8.6*  HCT 24.0* 25.7*  PLT 207 205   BMET  Basename 03/07/12 0421 03/05/12 0430  NA 144 139  K 3.2* 4.0  CL 111 109  CO2 25 22  GLUCOSE 119* 141*  BUN 10 9  CREATININE 0.54 0.52  CALCIUM 7.5* 7.9*    Studies/Results: No results found.  Medications: I have reviewed the patient's current medications.   Physical exam GENERAL- alert HEAD- normal atraumatic, no neck masses, normal thyroid, no jvd RESPIRATORY- appears well, vitals normal, no respiratory distress, acyanotic, normal RR, ear and throat exam is normal, neck free of mass or lymphadenopathy, chest clear, no wheezing, crepitations, rhonchi, normal symmetric air entry CVS- regular rate and rhythm, S1, S2 normal, no  murmur, click, rub or gallop ABDOMEN-fresh surgical scar. No bleed. No mass. Non tender.+BS. NEURO- Grossly normal EXTREMITIES- extremities normal, atraumatic, no cyanosis or edema  Plan   * Superior mesenteric artery thrombosis/ ATRIAL FIBRILLATION, CHRONIC- appreciate vvs/cardiology. Worry that patient may be losing blood from somewhere, ?retroperitoneal. Will obtain ct abdomen/pelvis. If bleed, reconsider anticoagulation.    ANGIODYSPLASIA, COLON  * Anemia- not sure of source. Transfuse. Follow ct abdomen/pelvis. Monitor h/h.  Critical time spend 35 minutes.  Kathleene Bergemann 03/07/2012 3:22 PM Pager: [phone].  720p- noted ct abdomen/pelvis findings. Appreciate Dr Rubye Oaks input. Will d/c heaprin/coumadin.  Hendrix Console,MD pager#[phone].

## 2012-03-07 NOTE — Progress Notes (Signed)
Occupational Therapy Treatment Patient Details Name: Lisa Douglas MRN: [id number] DOB: Nov 09, 1927 Today's Date: 03/07/2012 Time: 5621-3086 OT Time Calculation (min): 11 min  OT Assessment / Plan / Recommendation Comments on Treatment Session PT progressing during session to one person (A) however extremely fatigued. Pt with symptoms of dizziness and fatigue.    Follow Up Recommendations  Home health OT;Supervision/Assistance - 24 hour    Barriers to Discharge       Equipment Recommendations       Recommendations for Other Services    Frequency Min 2X/week   Plan Discharge plan remains appropriate    Precautions / Restrictions Precautions Precautions: Fall   Pertinent Vitals/Pain none    ADL  Toilet Transfer: Performed;Min guard Toilet Transfer Method: Sit to Barista: Raised toilet seat with arms (or 3-in-1 over toilet) Toileting - Clothing Manipulation and Hygiene: Performed;+1 Total assistance Where Assessed - Toileting Clothing Manipulation and Hygiene: Sit to stand from 3-in-1 or toilet Equipment Used: Rolling walker Transfers/Ambulation Related to ADLs: PT ambulated ~8 ft min guard (A). Pt reports fatigue ADL Comments: Pt on 3n1 on arrival. PT agreeable to OT assisting with 3n1. Pt provided wash cloth for hygiene and pt requesting OT complete task due to inability. Pt ambulated back to bed and resting to return to supine due to fatigue. Pt with noted decreased hgb overnight with symptoms of dizziness and fatigue. OT to continue to follow acutely. pt declining all liquid and food present in room. Pt with very little PO intake of lunch      OT Goals Acute Rehab OT Goals OT Goal Formulation: With patient Time For Goal Achievement: 03/20/12 Potential to Achieve Goals: Good ADL Goals Pt Will Perform Grooming: with modified independence;Standing at sink Pt Will Perform Upper Body Bathing: with modified independence;Sitting at sink Pt Will Perform  Lower Body Bathing: with modified independence;Sit to stand from chair Pt Will Perform Upper Body Dressing: with modified independence;Sitting, bed;Sitting, chair Pt Will Perform Lower Body Dressing: with modified independence;Sit to stand from chair;Sit to stand from bed Pt Will Transfer to Toilet: with modified independence;Ambulation;Regular height toilet ADL Goal: Toilet Transfer - Progress: Progressing toward goals Pt Will Perform Toileting - Clothing Manipulation: with modified independence;Sitting on 3-in-1 or toilet ADL Goal: Toileting - Clothing Manipulation - Progress: Progressing toward goals Pt Will Perform Toileting - Hygiene: with modified independence;Leaning right and/or left on 3-in-1/toilet ADL Goal: Toileting - Hygiene - Progress: Progressing toward goals  Visit Information  Last OT Received On: 03/07/12 Assistance Needed: +1    Subjective Data      Prior Functioning       Cognition  Overall Cognitive Status: Appears within functional limits for tasks assessed/performed Arousal/Alertness: Awake/alert Behavior During Session: Flat affect    Mobility Bed Mobility Sit to Supine: 3: Mod assist;HOB elevated Details for Bed Mobility Assistance: pt required (A) with LB to complete Stand <>sit sit<>Supine.  Transfers Transfers: Sit to Stand;Stand to Sit Sit to Stand: 4: Min guard;With upper extremity assist;From toilet Stand to Sit: 4: Min guard;With upper extremity assist;To bed   Exercises    Balance    End of Session OT - End of Session Activity Tolerance: Patient limited by fatigue Patient left: in bed;with call bell/phone within reach Nurse Communication: Mobility status  GO     Harrel Carina [facility] 03/07/2012, 2:23 PM .pager

## 2012-03-07 NOTE — Progress Notes (Signed)
PT Cancellation Note     Treatment cancelled today due to patient's polite refusal to participate because I'm too tired, having had no sleep last night 03/07/2012  Chowan Bing, PT [phone] (pager)

## 2012-03-07 NOTE — Progress Notes (Signed)
Clinical Social Work Department BRIEF PSYCHOSOCIAL ASSESSMENT 03/07/2012  Patient:  Lisa Douglas, Lisa Douglas     Account Number:  [id number]     Admit date:  03/02/2012  Clinical Social Worker:  Dennison Bulla  Date/Time:  03/07/2012 03:00 PM  Referred by:  Physician  Date Referred:  03/07/2012 Referred for  SNF Placement   Other Referral:   Interview type:  Patient Other interview type:   Family participated in assessment    PSYCHOSOCIAL DATA Living Status:  FAMILY Admitted from facility:   Level of care:   Primary support name:  Amy Primary support relationship to patient:  FAMILY Degree of support available:   Strong    CURRENT CONCERNS Current Concerns  Post-Acute Placement   Other Concerns:    SOCIAL WORK ASSESSMENT / PLAN CSW received referral for dc planning. CSW reviewed chart which stated PT recommended 24 hour care. CSW met with patient at bedside. Family was present and patient was agreeable to family being involved in assessment.    CSW introduced myself and explained role. CSW discussed patient's prior living arrangements. Patient lives with family and has 24 hour care. Patient already has equipment at home such as walker, cane, bedside commode, shower chair, etc. Patient reports that she feels that she is able to return home with help of her family. Family is agreeable to this plan as well. Patient is receptive to PT/OT at home. CSW made referral to CM.    CSW is signing off but available if needed.   Assessment/plan status:  No Further Intervention Required Other assessment/ plan:   Information/referral to community resources:   SNF information    PATIENT'S/FAMILY'S RESPONSE TO PLAN OF CARE: Patient was alert and oriented. Patient does not desire SNF and prefers to return home with family. Patient has 24 hour care.

## 2012-03-07 NOTE — Progress Notes (Signed)
VASCULAR PROGRESS NOTE  And was ordered today by primary service and this shows an area of hemorrhage possibly within the peritoneum. The area of the SMA repair shows normal postoperative changes. However, there is a large collection in the anterior abdomen with layering suggest possible active bleeding. This does not appear to be a clear-cut rectus hematoma. It could potentially be a mesenteric bleed into the peritoneal cavity.  PHYSICAL EXAM: Filed Vitals:   03/07/12 1300 03/07/12 1555 03/07/12 1730 03/07/12 1734  BP:  111/71 126/94   Pulse: 95 90  94  Temp:  98.4 F (36.9 C) 97.7 F (36.5 C)   TempSrc:  Oral Oral   Resp:  18  19  Height:      Weight:      SpO2:  96%     Abdomen slightly distended.  LABS: Lab Results  Component Value Date   WBC 11.3* 03/07/2012   HGB 8.0* 03/07/2012   HCT 24.0* 03/07/2012   MCV 93.8 03/07/2012   PLT 207 03/07/2012   Lab Results  Component Value Date   CREATININE 0.54 03/07/2012   Lab Results  Component Value Date   INR 1.30 03/07/2012   ASSESSMENT/PLAN: 1.given that hemoglobin has been dropping now with evidence of possible active bleeding in the peritoneal cavity, I have recommended exploratory laparotomy to rule out active bleeding. I discussed this with the patient and family.  I've explained I think postoperatively we'll have to hold off on anti-coagulation given her risk of bleeding.  Waverly Ferrari, MD, FACS Beeper: [phone] 03/07/2012

## 2012-03-07 NOTE — Progress Notes (Addendum)
Patient going to the OR per Dr. Edilia Bo, Unit #1 of PRBC started as OR transporters arrived, start VS were obtained.Family at bedside to speak to MD and for transport to the OR. Heparin stopped per MD at 1740.

## 2012-03-07 NOTE — Progress Notes (Signed)
ANTICOAGULATION CONSULT NOTE - Follow Up Consult  Pharmacy Consult for Heparin Indication: SMA embolus s/p successful embolectomy; Hx Afib (not on chronic anticoagulation)  Allergies  Allergen Reactions  . Penicillins     angioedema  . Alendronate Sodium     REACTION: INTOL to Fosamax per pt.  . Atorvastatin     REACTION: INTOL to Lipitor per pt.   Labs:  Basename 03/07/12 0421 03/06/12 1914 03/06/12 0955 03/06/12 0415 03/05/12 1630 03/05/12 0430  HGB 8.6* 10.5* -- -- -- --  HCT 25.7* 31.7* 24.2* -- -- --  PLT 205 192 -- 177 -- --  APTT -- -- -- -- -- --  LABPROT 16.4* -- -- -- 15.5* --  INR 1.30 -- -- -- 1.20 --  HEPARINUNFRC 0.36 0.91* -- 0.26* -- --  CREATININE -- -- -- -- -- 0.52  CKTOTAL -- -- -- -- -- --  CKMB -- -- -- -- -- --  TROPONINI -- -- -- -- -- --    Estimated Creatinine Clearance: 52.8 ml/min (by C-G formula based on Cr of 0.52).   Assessment: 73 yof admitted with SMA embolus on heparin s/p embolectomy. Patient also has h/o Afib but has not been on chronic anticoagulation.  Appears she is hesitant to take coumadin long-term d/t risk of bleeding. No bleeding noted per RN. No issues with line. Hgb dec from admission - continue to follow s/p PRBC this afternoon.  Repeat Heparin level at 0.36,infusing at correct rate.  Hemoglobin is down, despite PRBC transfusion.  No noted bleeding complications.   Goal of Therapy:  INR=2-3 Heparin level 0.3-0.7 units/ml Monitor platelets by anticoagulation protocol: Yes   Plan:  1. Continue IV Heparin gtt at 900 units/hr 3. Daily INR, heparin level and CBC  Nadara Mustard, PharmD., MS Clinical Pharmacist Pager:  [phone]  Thank you for allowing pharmacy to be part of this patients care team. 03/07/2012,4:53 AM

## 2012-03-07 NOTE — Op Note (Signed)
NAME: Lisa Douglas   MRN: [id number] DOB: May 27, 1928    DATE OF OPERATION: 03/07/2012  PREOP DIAGNOSIS: bleeding status post SMA embolectomy  POSTOP DIAGNOSIS: Intra-abdominal hematoma with no evidence of active bleeding.  PROCEDURE: exploratory laparotomy evacuation of intra-abdominal hematoma  SURGEON: Di Kindle. Edilia Bo, MD, FACS  ASSIST: Della Goo  ANESTHESIA: Gen.   EBL: 1000 cc of old blood  INDICATIONS: [name] is a 78 y.o. female who had undergone an SMA embolectomy 4 days ago. Postoperatively she was placed on heparin because of the risk of embolization. She had a drop in her hemoglobin and required transfusion. Her hemoglobin drifted down again and a CT scan was obtained which was interpreted by the radiologist as showing evidence of active bleeding. Based on this she was taken to the operating room for exploration.  FINDINGS: no active bleeding was found. The site of SMA embolectomy showed normal postoperative changes without significant hematoma or evidence of bleeding. A large amount of old blood was evacuated and the abdomen irrigated. There was no evidence of mesenteric bleed or bleeding within the abdominal wall.  TECHNIQUE: The patient was taken to the operating room and received a general anesthetic. The abdomen was prepped and draped in the usual sterile fashion. The previous incision was opened and there was a large amount of old blood present but no evidence of arterial bleeding or active bleeding. All the old blood was evacuated and the abdomen irrigated. The transverse colon was reflected superiorly and the small bowel reflected inferiorly and the area of SMA embolectomy showed no significant hematoma and normal postoperative changes. The mesentery had no significant bleeding sites noted nor did the abdominal wall. I suspected that the bleeding was related to her being heparinized postoperatively. After all the blood was evacuated and the abdomen irrigated  the abdominal contents were returned to their normal position. The fascial layer was closed with 2 #1 PDS sutures. The subcutaneous layer was closed with 2 running 3-0 Vicryl sutures. The skin was closed with 2 4-0 subcuticular stitches. Sterile dressing was applied. The patient tolerated the procedure well and was transferred to the recovery room in stable condition. All needle and sponge counts were correct.  Waverly Ferrari, MD, FACS [facility]  DATE OF DICTATION:   03/07/2012

## 2012-03-07 NOTE — Anesthesia Postprocedure Evaluation (Signed)
Anesthesia Post Note  Patient: Lisa Douglas  Procedure(s) Performed: Procedure(s) (LRB): EXPLORATORY LAPAROTOMY (N/A)  Anesthesia type: general  Patient location: PACU  Post pain: Pain level controlled  Post assessment: Patient's Cardiovascular Status Stable  Last Vitals:  Filed Vitals:   03/07/12 2015  BP:   Pulse: 95  Temp:   Resp: 19    Post vital signs: Reviewed and stable  Level of consciousness: sedated  Complications: No apparent anesthesia complications

## 2012-03-07 NOTE — Preoperative (Signed)
Beta Blockers   Reason not to administer Beta Blockers:B blocker taken 03/07/12 @ 10am

## 2012-03-07 NOTE — Transfer of Care (Cosign Needed)
Immediate Anesthesia Transfer of Care Note  Patient: Lisa Douglas  Procedure(s) Performed: Procedure(s) (LRB): EXPLORATORY LAPAROTOMY (N/A)  Patient Location: PACU  Anesthesia Type: General  Level of Consciousness: awake, alert  and oriented  Airway & Oxygen Therapy: Patient Spontanous Breathing and Patient connected to nasal cannula oxygen  Post-op Assessment: Report given to PACU RN, Post -op Vital signs reviewed and stable and Patient moving all extremities  Post vital signs: Reviewed and stable  Complications: No apparent anesthesia complications

## 2012-03-07 NOTE — Progress Notes (Signed)
  ANTICOAGULATION CONSULT NOTE - Follow Up Consult  Pharmacy Consult for Heparin, Coumadin Indication: SMA embolus s/p successful embolectomy; Hx Afib (not on chronic anticoagulation)  Patient Measurements: Height: 5\' 5"  (165.1 cm) Weight: 151 lb 10.8 oz (68.8 kg) IBW/kg (Calculated) : 57  Heparin Dosing Weight: 69 kg  Vital Signs: Temp: 97.5 F (36.4 C) (07/16 0800) Temp src: Oral (07/16 0800) BP: 124/55 mmHg (07/16 1045) Pulse Rate: 94  (07/16 1045)  Labs:  Basename 03/07/12 0421 03/06/12 1914 03/06/12 0955 03/06/12 0415 03/05/12 1630 03/05/12 0430  HGB 8.6* 10.5* -- -- -- --  HCT 25.7* 31.7* 24.2* -- -- --  PLT 205 192 -- 177 -- --  APTT -- -- -- -- -- --  LABPROT 16.4* -- -- -- 15.5* --  INR 1.30 -- -- -- 1.20 --  HEPARINUNFRC 0.36 0.91* -- 0.26* -- --  CREATININE 0.54 -- -- -- -- 0.52  CKTOTAL -- -- -- -- -- --  CKMB -- -- -- -- -- --  TROPONINI -- -- -- -- -- --    Estimated Creatinine Clearance: 52.8 ml/min (by C-G formula based on Cr of 0.54).  Medications:  Scheduled:    . diltiazem  180 mg Oral Daily  . furosemide  40 mg Intravenous Once  . metoprolol tartrate  25 mg Oral BID  . potassium chloride  10 mEq Intravenous Q1 Hr x 4  . simvastatin  20 mg Oral q1800  . warfarin  4 mg Oral Once  . Warfarin - Pharmacist Dosing Inpatient   Does not apply q1800  . DISCONTD: aspirin EC  81 mg Oral Daily   Infusions:    . dextrose 5 % and 0.9 % NaCl with KCl 20 mEq/L 50 mL/hr (03/06/12 1837)  . diltiazem (CARDIZEM) infusion 5 mg/hr (03/06/12 1500)  . heparin 900 Units/hr (03/06/12 2015)  . DISCONTD: heparin 1,050 Units/hr (03/06/12 1900)    Assessment:  78 y.o. female who is 4 Days Post-Op Embolectomy of the superior mesenteric artery   She is on Coumadin for atrial fibrillation (no anticoagulants PTA) with Heparin bridging.  History of GIB in past with no current evidence of active bleeding.  Some blood in sputum.  Following post-transfusion Hgb yesterday  of 10.5, today's Hgb has dropped significantly to 8.6.  Heparin level at low end of therapeutic range, INR 1.3 [SUBtherapeutic]   Goal of Therapy:  Target INR 2-3   Heparin level 0.3 - 0.4 units/ml   Plan:   Lower Coumadin dose today to 2.5 mg x 1.  Continue Heparin at 900 units/hr.  Due to drop in Hgb, will recheck Heparin level and CBC in 8 hours.  Daily Heparin level, INR, CBC.  Guaiac stools  Morayma [place], [employer].D 03/07/2012,11:22 AM

## 2012-03-07 NOTE — Anesthesia Preprocedure Evaluation (Addendum)
Anesthesia Evaluation  Patient identified by MRN, date of birth, ID band Patient awake    Reviewed: Allergy & Precautions, H&P , NPO status , Patient's Chart, lab work & pertinent test results, reviewed documented beta blocker date and time   Airway Mallampati: I TM Distance: >3 FB Neck ROM: full    Dental   Pulmonary          Cardiovascular + dysrhythmias Atrial Fibrillation     Neuro/Psych Anxiety    GI/Hepatic GERD-  Medicated and Controlled,  Endo/Other    Renal/GU      Musculoskeletal   Abdominal   Peds  Hematology   Anesthesia Other Findings   Reproductive/Obstetrics                           Anesthesia Physical Anesthesia Plan  ASA: III and Emergent  Anesthesia Plan: General   Post-op Pain Management:    Induction: Intravenous, Rapid sequence and Cricoid pressure planned  Airway Management Planned: Oral ETT  Additional Equipment: Arterial line  Intra-op Plan:   Post-operative Plan: Extubation in OR  Informed Consent: I have reviewed the patients History and Physical, chart, labs and discussed the procedure including the risks, benefits and alternatives for the proposed anesthesia with the patient or authorized representative who has indicated his/her understanding and acceptance.     Plan Discussed with: CRNA and Surgeon  Anesthesia Plan Comments:         Anesthesia Quick Evaluation

## 2012-03-07 NOTE — Anesthesia Procedure Notes (Signed)
Procedure Name: Intubation Date/Time: 03/07/2012 6:47 PM Performed by: Glendora Score A Pre-anesthesia Checklist: Patient identified, Timeout performed, Emergency Drugs available, Suction available and Patient being monitored Patient Re-evaluated:Patient Re-evaluated prior to inductionOxygen Delivery Method: Circle system utilized Preoxygenation: Pre-oxygenation with 100% oxygen Intubation Type: IV induction Ventilation: Mask ventilation without difficulty Laryngoscope Size: Miller and 2 Grade View: Grade I Tube type: Oral Tube size: 7.5 mm Number of attempts: 1 Airway Equipment and Method: Stylet Placement Confirmation: ETT inserted through vocal cords under direct vision,  breath sounds checked- equal and bilateral and positive ETCO2 Secured at: 20 cm Tube secured with: Tape Dental Injury: Teeth and Oropharynx as per pre-operative assessment

## 2012-03-07 NOTE — Progress Notes (Signed)
Dr.Ossey at bedside, updated on pt's elevated BP per Art line, informed of cuff pressure not correlating with art line in bil upper extrem., instructed to medicate per Art line at this time, will cont to assess

## 2012-03-07 NOTE — Progress Notes (Addendum)
[facility] [place]  Post-op  Intra-abdominal Surgery note  Date of Surgery: 03/02/2012 - 03/03/2012 Surgeon: Moishe Spice): Chuck Hint, MD POD: 4 Days Post-Op Procedure(s): embolectomy of the superior mesenteric artery with bovine pericardial patch angioplasty of superior mesenteric artery  History of Present Illness  ALLESANDRA HUEBSCH is a 78 y.o. female who had not slept last PM. She C/O back pain but no abdominal pain. She states she was coughing and had blood tinged sputum. Pt with hx GI bleed in the past. Pt denies incisional pain; denies nausea/vomiting; denies diarrhea. Has had flatus; has had watery BM. Pt is hungry.  IMAGING: No results found.  Significant Diagnostic Studies: CBC Lab Results  Component Value Date   WBC 10.3 03/07/2012   HGB 8.6* 03/07/2012   HCT 25.7* 03/07/2012   MCV 93.5 03/07/2012   PLT 205 03/07/2012    BMET    Component Value Date/Time   NA 144 03/07/2012 0421   K 3.2* 03/07/2012 0421   CL 111 03/07/2012 0421   CO2 25 03/07/2012 0421   GLUCOSE 119* 03/07/2012 0421   BUN 10 03/07/2012 0421   CREATININE 0.54 03/07/2012 0421   CALCIUM 7.5* 03/07/2012 0421   GFRNONAA 86* 03/07/2012 0421   GFRAA >90 03/07/2012 0421    COAG Lab Results  Component Value Date   INR 1.30 03/07/2012   INR 1.20 03/05/2012   INR 1.38 03/03/2012   No results found for this basename: PTT    I/O last 3 completed shifts: In: 2607.7 [P.O.:50; I.V.:2557.7] Out: 1800 [Urine:1800]    Physical Examination BP Readings from Last 3 Encounters:  03/07/12 135/71  03/07/12 135/71  12/30/11 114/72   Temp Readings from Last 3 Encounters:  03/07/12 98.9 F (37.2 C) Oral  03/07/12 98.9 F (37.2 C) Oral  12/30/11 96.8 F (36 C) Oral   SpO2 Readings from Last 3 Encounters:  03/07/12 95%  03/07/12 95%  12/30/11 99%   Pulse Readings from Last 3 Encounters:  03/07/12 104  03/07/12 104  12/30/11 75    General: A&O x 3, WDWN female in NAD Pulmonary:  normal non-labored breathing , without Rales, rhonchi,  wheezing Cardiac: Heart rate : irregular ,  Abdomen:abdomen soft, non-tender and normal active bowel sounds Abdominal wound:clean, dry, intact  Neurologic: A&O X 3; Appropriate Affect ; SENSATION: normal; MOTOR FUNCTION:  moving all extremities equally. Speech is fluent/normal Vascular Exam:BLE warm  Extremities without ischemic changes, no Gangrene, no cellulitis; no open wounds;   Assessment/Plan: ANAIRIS KNICK is a 78 y.o. female who is 4 Days Post-Op Procedure(s): Embolectomy of the superior mesenteric artery with bovine pericardial patch angioplasty of superior mesenteric artery Pt HGB down again this AM - some PO anemia , worry about other sources of bleeding Pt with Hx GI bleed in the past - will guaiac stools Positive blood in sputum Keep heparin level on low end Check BNP - may need more lasix today Hypokalemia - replace Begin clear liq today  [place] [phone] 03/07/2012 7:29 AM  Agree with above. On heparin and Coumadin. Had small BM yesterday. Having to go slowly with anticoagulation because of her anemia. Has had some nose bleeds also.  Di Kindle. Edilia Bo, MD, FACS Beeper [phone] 03/07/2012

## 2012-03-08 DIAGNOSIS — D649 Anemia, unspecified: Secondary | ICD-10-CM

## 2012-03-08 LAB — COMPREHENSIVE METABOLIC PANEL
CO2: 25 mEq/L (ref 19–32)
Calcium: 7.2 mg/dL — ABNORMAL LOW (ref 8.4–10.5)
Creatinine, Ser: 0.43 mg/dL — ABNORMAL LOW (ref 0.50–1.10)
GFR calc Af Amer: >90 mL/min (ref >90)
GFR calc non Af Amer: >90 mL/min (ref >90)
Glucose, Bld: 162 mg/dL — ABNORMAL HIGH (ref 70–99)
Total Protein: 4.5 g/dL — ABNORMAL LOW (ref 6.0–8.3)

## 2012-03-08 LAB — CBC
MCH: 29.9 pg (ref 26.0–34.0)
MCV: 89.4 fL (ref 78.0–100.0)
Platelets: 194 10*3/uL (ref 150–400)
RDW: 17.7 % — ABNORMAL HIGH (ref 11.5–15.5)

## 2012-03-08 LAB — PROTIME-INR: INR: 2.18 — ABNORMAL HIGH (ref 0.00–1.49)

## 2012-03-08 MED ORDER — VITAMIN K1 10 MG/ML IJ SOLN
5.0000 mg | Freq: Once | INTRAVENOUS | Status: AC
  Administered 2012-03-08: 5 mg via INTRAVENOUS
  Filled 2012-03-08: qty 0.5

## 2012-03-08 MED ORDER — METOPROLOL TARTRATE 1 MG/ML IV SOLN
5.0000 mg | INTRAVENOUS | Status: DC | PRN
  Filled 2012-03-08 (×3): qty 5

## 2012-03-08 MED ORDER — DILTIAZEM HCL 100 MG IV SOLR
5.0000 mg/h | INTRAVENOUS | Status: AC
  Administered 2012-03-08: 5 mg/h via INTRAVENOUS
  Administered 2012-03-09 – 2012-03-10 (×4): 10 mg/h via INTRAVENOUS
  Filled 2012-03-08 (×4): qty 100

## 2012-03-08 NOTE — Progress Notes (Signed)
VASCULAR PROGRESS NOTE  SUBJECTIVE: Resting comfortably. Pain well controlled.   PHYSICAL EXAM: Filed Vitals:   03/07/12 2200 03/08/12 0000 03/08/12 0200 03/08/12 0400  BP: 137/40 106/44 109/46 120/63  Pulse: 68 93 92 100  Temp: 97.4 F (36.3 C) 97.4 F (36.3 C)  97.4 F (36.3 C)  TempSrc: Oral Oral  Oral  Resp: 15 13 13 15   Height:      Weight:      SpO2: 90% 94% 94% 94%   Abd: dressing dry. Has hypoactive BS  LABS: Lab Results  Component Value Date   WBC 11.4* 03/08/2012   HGB 9.0* 03/08/2012   HCT 26.9* 03/08/2012   MCV 89.4 03/08/2012   PLT 194 03/08/2012   Lab Results  Component Value Date   CREATININE 0.43* 03/08/2012   Lab Results  Component Value Date   INR 2.18* 03/08/2012    ASSESSMENT/PLAN: 1. POD 1: Exploratory Lap for bleeding seen on CT scan. No active bleeding found. Blood in abdomen appeared to be from generalized oozing secondary to heparin. Will need to hold heparin for 72 hours.  2. POD 5: SMA embolectomy. Will eventually need to reconsider anticoagulation.  3. Not sure why INR is up. She has not been on Coumadin. She has a F/U INR in AM. Will hold off on FFP unless she has evidence of bleeding.  4. GI/Nutr: NPO and NGT until bowel function returns.  Waverly Ferrari, MD, FACS Beeper: [phone] 03/08/2012

## 2012-03-08 NOTE — Progress Notes (Signed)
[facility] Progress Note [place] TEAM 1 - Stepdown/ICU TEAM   Lisa Douglas ZOX:[id number] DOB: [birth year] DOA: 03/02/2012 PCP: Michele Mcalpine, MD  Assessment/Plan:  Acute Superior mesenteric artery thrombosis Felt to likely represent embolic phenomenon related to atrial fibrillation in that there were no preceding symptoms consistent with gut claudication - now status post embolectomy of the superior mesenteric artery with bovine pericardial patch angioplasty of superior mesenteric artery - holding anticoag as discussed below  Acute Douglas loss anemia with intra-abdominal hematoma In that Hgb continued to drift downward, CTabdom was accomplished - this revealed findings worrisome for intr-abdom bleeding - the pt was taken to the OR on 03/07/12 for an explor lap wch revealed an intra-abdom hematoma w/o evidence of active bleeding - no choice at this time but to hold heparin for 72 hours - goal should be to keep Hgb > 8 consistently  Coumadin induced coagulopathy Pt did receive coumadin on 7/14 and 7/15 - her INR is now increased as a result - given her active bleeding issue, I will dose her with a modest dose of vitamin K today x 1 and f/u INR in AM  ATRIAL FIBRILLATION, CHRONIC a transthoracic echo reveals no evidence of an intracardiac thrombus - RVR has been an issue - HR poorly controlled as she is off oral meds - resume cardizem gtt and follow - family inquires about use of alternative anticoag agents - once pt cleared to resume anticoag we will ask her Cardiologist to see her and to assist in this decision  HYPERLIPIDEMIA Resume treatment when able to take oral meds/food consistently  Possible right lung nodule Will consider CT of the chest once the patient is more stable hemodynamically  ANGIODYSPLASIA, COLON with prior history of GI bleeding Patient was previously taken off of Coumadin due to known AVMs (pt now states she never actually had a GIB, but was simply found to  have AVMs)   Code Status: Full Family Communication: communicated with the patient and family at the bedside Disposition Plan: Remain in step down unit until heart rate/A. fib better controlled and Hgb proven to be stable  Brief narrative: 78 y.o. female with history of chronic atrial fibrillation (not on full anticoagulation due to AVM), anxiety, GERD, and hyperlipidemia, who presented to the emergency room complaining of the acute onset of abdominal pain for 2 days, accompanied with nausea and vomiting along with watery diarrhea. She denied any recent antibiotic. She denied black stool or bloody stool. She denied any fever or chills. Evaluation in emergency room included an abdominal pelvic CT which revealed a superior mesenteric artery non-occlusive thrombus, but no evidence of ischemic bowel (along with incidental finding of a small pulmonary nodule and calcification in the aorta).  Consultants: Vascular surgery GI - telephone consultation  Procedures: 03/03/2012 - embolectomy of the superior mesenteric artery with bovine pericardial patch angioplasty of superior mesenteric artery  03/07/2012 - exploratory laparotomy with evacuation of intra-abdom hematoma  03/04/2012 2D Echo - no evidence of intracardiac thrombus - preserved LV systolic fxn  Antibiotics: None   HPI/Subjective: Not yet moving her bowels post surgery #2.  No cp, sob, n/v, f/c.  Alert and conversant.   Objective: Douglas pressure 119/54, pulse 100, temperature 97.8 F (36.6 C), temperature source Oral, resp. rate 15, height 5\' 5"  (1.651 m), weight 68.8 kg (151 lb 10.8 oz), SpO2 94.00%.  Intake/Output Summary (Last 24 hours) at 03/08/12 1102 Last data filed at 03/08/12 0812  Gross per 24 hour  Intake  2265.42 ml  Output    950 ml  Net 1315.42 ml     Exam: General: No acute respiratory distress Lungs: Clear to auscultation bilaterally without wheezes or crackles Cardiovascular: Irregularly irregular without  gallop or rub - rate ~130 Abdomen: Midline incision dressed and dry, bowel sounds not appreciated, mildly tender to soft palpation throughout, nondistended, no rebound Extremities: No significant cyanosis, clubbing, or edema bilateral lower extremities  Data Reviewed: Basic Metabolic Panel:  Lab 03/08/12 9528 03/07/12 2027 03/07/12 0421 03/05/12 0430 03/04/12 0426 03/03/12 1750  NA 140 140 144 139 139 --  K 3.8 4.0 3.2* 4.0 3.9 --  CL 107 107 111 109 108 --  CO2 25 24 25 22 23  --  GLUCOSE 162* 131* 119* 141* 141* --  BUN 11 12 10 9 11  --  CREATININE 0.43* 0.49* 0.54 0.52 0.51 --  CALCIUM 7.2* 7.5* 7.5* 7.9* 7.7* --  MG 1.7 1.7 -- 1.9 1.6 1.4*  PHOS 1.6* -- -- -- -- --   Liver Function Tests:  Lab 03/08/12 0500 03/04/12 0426 03/02/12 2340  AST 35 28 33  ALT 22 18 19   ALKPHOS 55 46 71  BILITOT 0.8 0.4 0.6  PROT 4.5* 5.4* 7.5  ALBUMIN 2.0* 2.7* 4.2    Lab 03/08/12 0500 03/04/12 0426 03/02/12 2340  LIPASE -- -- 25  AMYLASE 27 30 --   CBC:  Lab 03/08/12 0500 03/07/12 2030 03/07/12 1339 03/07/12 0421 03/06/12 1914 03/02/12 2340  WBC 11.4* 10.8* 11.3* 10.3 10.5 --  NEUTROABS -- -- -- -- -- 12.1*  HGB 9.0* 9.8* 8.0* 8.6* 10.5* --  HCT 26.9* 28.4* 24.0* 25.7* 31.7* --  MCV 89.4 90.7 93.8 93.5 93.8 --  PLT 194 209 207 205 192 --   Cardiac Enzymes:  Lab 03/02/12 2340  CKTOTAL --  CKMB --  CKMBINDEX --  TROPONINI <0.30    Recent Results (from the past 240 hour(s))  MRSA PCR SCREENING     Status: Normal   Collection Time   03/03/12  8:54 PM      Component Value Range Status Comment   MRSA by PCR NEGATIVE  NEGATIVE Final      Studies:  Recent x-ray studies have been reviewed in detail by the Attending Physician  Scheduled Meds:  Reviewed in detail by the Attending Physician   Lisa Blood, MD [facility] Office  [phone] Pager [phone]  On-Call/Text Page:      [email or web address]      password [email or web address]  If 7PM-7AM, please contact  night-coverage [email or web address] Password [email or web address] 03/08/2012, 11:02 AM   LOS: 6 days

## 2012-03-08 NOTE — Progress Notes (Signed)
PT Cancellation Note  Treatment cancelled today due to medical issues with patient which prohibited therapy -- too lethargic at the beginning of post op day 1. 03/08/2012  [place] Bing, PT [phone] (pager)

## 2012-03-09 ENCOUNTER — Encounter (HOSPITAL_COMMUNITY): Payer: Self-pay | Admitting: Vascular Surgery

## 2012-03-09 DIAGNOSIS — E785 Hyperlipidemia, unspecified: Secondary | ICD-10-CM

## 2012-03-09 LAB — CBC
Hemoglobin: 8.5 g/dL — ABNORMAL LOW (ref 12.0–15.0)
Platelets: 199 10*3/uL (ref 150–400)
RBC: 2.81 MIL/uL — ABNORMAL LOW (ref 3.87–5.11)
WBC: 11.2 10*3/uL — ABNORMAL HIGH (ref 4.0–10.5)

## 2012-03-09 LAB — PROTIME-INR
INR: 1.23 (ref 0.00–1.49)
Prothrombin Time: 15.8 seconds — ABNORMAL HIGH (ref 11.6–15.2)

## 2012-03-09 MED ORDER — SODIUM CHLORIDE 0.9 % IV BOLUS (SEPSIS)
250.0000 mL | Freq: Once | INTRAVENOUS | Status: AC
  Administered 2012-03-09: 250 mL via INTRAVENOUS

## 2012-03-09 MED ORDER — METOPROLOL TARTRATE 1 MG/ML IV SOLN
5.0000 mg | Freq: Four times a day (QID) | INTRAVENOUS | Status: DC
  Administered 2012-03-09 – 2012-03-10 (×7): 5 mg via INTRAVENOUS
  Filled 2012-03-09 (×6): qty 5

## 2012-03-09 MED ORDER — SODIUM CHLORIDE 0.9 % IJ SOLN
INTRAMUSCULAR | Status: AC
  Administered 2012-03-09: 11:00:00
  Filled 2012-03-09: qty 10

## 2012-03-09 MED ORDER — PANTOPRAZOLE SODIUM 40 MG IV SOLR
40.0000 mg | Freq: Two times a day (BID) | INTRAVENOUS | Status: DC
  Administered 2012-03-09 – 2012-03-10 (×3): 40 mg via INTRAVENOUS
  Filled 2012-03-09 (×3): qty 40

## 2012-03-09 NOTE — Progress Notes (Signed)
VASCULAR PROGRESS NOTE  SUBJECTIVE: Pain adequately controlled  PHYSICAL EXAM: Filed Vitals:   03/08/12 1600 03/08/12 2000 03/09/12 0000 03/09/12 0400  BP:  126/96 111/53 119/44  Pulse:  148 92 90  Temp: 97.8 F (36.6 C) 97.9 F (36.6 C) 98.2 F (36.8 C) 98 F (36.7 C)  TempSrc: Oral Oral Oral Oral  Resp:  14 22 13   Height:      Weight:      SpO2:  97% 100% 98%   Abdomen: quiet, incision looks fine  LABS: Lab Results  Component Value Date   WBC 11.2* 03/09/2012   HGB 8.5* 03/09/2012   HCT 25.9* 03/09/2012   MCV 92.2 03/09/2012   PLT 199 03/09/2012   Lab Results  Component Value Date   CREATININE 0.43* 03/08/2012   Lab Results  Component Value Date   INR 1.23 03/09/2012   ASSESSMENT/PLAN: 1. 2 Days Post-Op s/p: exp lap for intraabdominal hematoma. No active bleeding found.  2. POD 6 S/P SMA embolectomy 3. Leave NGT until bowel function returms. 4. Consider restarting anticoagulation slowly on Saturday.   Waverly Ferrari, MD, FACS Beeper: [phone] 03/09/2012

## 2012-03-09 NOTE — Progress Notes (Signed)
[facility] Progress Note [place] TEAM 1 - Stepdown/ICU TEAM   Lisa Douglas ZOX:[id number] DOB: 21-Jul-1928 DOA: 03/02/2012 PCP: Michele Mcalpine, MD  Assessment/Plan:  Acute Superior mesenteric artery thrombosis Felt to likely represent embolic phenomenon related to atrial fibrillation in that there were no preceding symptoms consistent with gut claudication - now status post embolectomy of the superior mesenteric artery with bovine pericardial patch angioplasty of superior mesenteric artery - holding anticoag as discussed below  Acute blood loss anemia with intra-abdominal hematoma In that Hgb continued to drift downward, CTabdom was accomplished - this revealed findings worrisome for intr-abdom bleeding - the pt was taken to the OR on 03/07/12 for an explor lap wch revealed an intra-abdom hematoma w/o evidence of active bleeding - no choice at this time but to hold heparin for 72 hours - goal should be to keep Hgb > 8 consistently  Coumadin induced coagulopathy Pt did receive coumadin on 7/14 and 7/15 - her INR is now increased as a result - given her active bleeding issue, I will dose her with a modest dose of vitamin K today x 1 and f/u INR in AM  ATRIAL FIBRILLATION, CHRONIC a transthoracic echo reveals no evidence of an intracardiac thrombus - RVR has been an issue - HR poorly controlled as she is off oral meds - resume cardizem gtt and follow - family inquires about use of alternative anticoag agents - once pt cleared to resume anticoag we will ask her Cardiologist to see her and to assist in this decision  HYPERLIPIDEMIA Resume treatment when able to take oral meds/food consistently  Possible right lung nodule Will consider CT of the chest once the patient is more stable hemodynamically  ANGIODYSPLASIA, COLON with prior history of GI bleeding Patient was previously taken off of Coumadin due to known AVMs (pt now states she never actually had a GIB, but was simply found to  have AVMs)   Code Status: Full Family Communication: communicated with the patient and family at the bedside Disposition Plan: Remain in step down unit until heart rate/A. fib better controlled and Hgb proven to be stable  Brief narrative: 78 y.o. female with history of chronic atrial fibrillation (not on full anticoagulation due to AVM), anxiety, GERD, and hyperlipidemia, who presented to the emergency room complaining of the acute onset of abdominal pain for 2 days, accompanied with nausea and vomiting along with watery diarrhea. She denied any recent antibiotic. She denied black stool or bloody stool. She denied any fever or chills. Evaluation in emergency room included an abdominal pelvic CT which revealed a superior mesenteric artery non-occlusive thrombus, but no evidence of ischemic bowel (along with incidental finding of a small pulmonary nodule and calcification in the aorta).  Consultants: Vascular surgery GI - telephone consultation  Procedures: 03/03/2012 - embolectomy of the superior mesenteric artery with bovine pericardial patch angioplasty of superior mesenteric artery  03/07/2012 - exploratory laparotomy with evacuation of intra-abdom hematoma  03/04/2012 2D Echo - no evidence of intracardiac thrombus - preserved LV systolic fxn  Antibiotics: None   HPI/Subjective: Not yet moving her bowels post surgery #2.  No cp, sob, n/v, f/c.  Alert and conversant.   Objective: Blood pressure 148/54, pulse 118, temperature 98.2 F (36.8 C), temperature source Oral, resp. rate 24, height 5\' 5"  (1.651 m), weight 68.8 kg (151 lb 10.8 oz), SpO2 90.00%.  Intake/Output Summary (Last 24 hours) at 03/09/12 1850 Last data filed at 03/09/12 1741  Gross per 24 hour  Intake  3919.92 ml  Output    700 ml  Net 3219.92 ml     Exam: General: No acute respiratory distress Lungs: Clear to auscultation bilaterally without wheezes or crackles Cardiovascular: Irregularly irregular without  gallop or rub - rate ~130 Abdomen: Midline incision dressed and dry, bowel sounds not appreciated, mildly tender to soft palpation throughout, nondistended, no rebound Extremities: No significant cyanosis, clubbing, or edema bilateral lower extremities  Data Reviewed: Basic Metabolic Panel:  Lab 03/08/12 4098 03/07/12 2027 03/07/12 0421 03/05/12 0430 03/04/12 0426 03/03/12 1750  NA 140 140 144 139 139 --  K 3.8 4.0 3.2* 4.0 3.9 --  CL 107 107 111 109 108 --  CO2 25 24 25 22 23  --  GLUCOSE 162* 131* 119* 141* 141* --  BUN 11 12 10 9 11  --  CREATININE 0.43* 0.49* 0.54 0.52 0.51 --  CALCIUM 7.2* 7.5* 7.5* 7.9* 7.7* --  MG 1.7 1.7 -- 1.9 1.6 1.4*  PHOS 1.6* -- -- -- -- --   Liver Function Tests:  Lab 03/08/12 0500 03/04/12 0426 03/02/12 2340  AST 35 28 33  ALT 22 18 19   ALKPHOS 55 46 71  BILITOT 0.8 0.4 0.6  PROT 4.5* 5.4* 7.5  ALBUMIN 2.0* 2.7* 4.2    Lab 03/08/12 0500 03/04/12 0426 03/02/12 2340  LIPASE -- -- 25  AMYLASE 27 30 --   CBC:  Lab 03/09/12 0500 03/08/12 0500 03/07/12 2030 03/07/12 1339 03/07/12 0421 03/02/12 2340  WBC 11.2* 11.4* 10.8* 11.3* 10.3 --  NEUTROABS -- -- -- -- -- 12.1*  HGB 8.5* 9.0* 9.8* 8.0* 8.6* --  HCT 25.9* 26.9* 28.4* 24.0* 25.7* --  MCV 92.2 89.4 90.7 93.8 93.5 --  PLT 199 194 209 207 205 --   Cardiac Enzymes:  Lab 03/02/12 2340  CKTOTAL --  CKMB --  CKMBINDEX --  TROPONINI <0.30    Recent Results (from the past 240 hour(s))  MRSA PCR SCREENING     Status: Normal   Collection Time   03/03/12  8:54 PM      Component Value Range Status Comment   MRSA by PCR NEGATIVE  NEGATIVE Final      Studies:  Recent x-ray studies have been reviewed in detail by the Attending Physician  Scheduled Meds:  Reviewed in detail by the Attending Physician   Calvert Cantor, MD [facility] Office  [phone] Pager [phone]  On-Call/Text Page:      [email or web address]      password [email or web address]  If 7PM-7AM, please contact  night-coverage [email or web address] Password [email or web address] 03/09/2012, 6:50 PM   LOS: 7 days

## 2012-03-09 NOTE — Progress Notes (Signed)
Physical Therapy Treatment Patient Details Name: Lisa Douglas MRN: [id number] DOB: [birth year] Today's Date: 03/09/2012 Time: 7829-5621 PT Time Calculation (min): 21 min  PT Assessment / Plan / Recommendation Comments on Treatment Session  pt's mobility improving as is her activity tolerance    Follow Up Recommendations  Home health PT;Supervision/Assistance - 24 hour    Barriers to Discharge        Equipment Recommendations  None recommended by PT    Recommendations for Other Services    Frequency Min 3X/week   Plan Discharge plan remains appropriate    Precautions / Restrictions Precautions Precautions: Fall   Pertinent Vitals/Pain     Mobility  Bed Mobility Bed Mobility: Sit to Supine Supine to Sit: 4: Min assist Details for Bed Mobility Assistance: vc's for technique and assist for LE's Transfers Transfers: Sit to Stand;Stand to Sit Sit to Stand: 4: Min assist;With upper extremity assist;From chair/3-in-1 Stand to Sit: 4: Min guard;With upper extremity assist;To bed;To chair/3-in-1 Details for Transfer Assistance: vc's for hand placement; mild lifting assist Ambulation/Gait Ambulation/Gait Assistance: 4: Min assist Ambulation Distance (Feet): 350 Feet Assistive device: Rolling walker Ambulation/Gait Assistance Details: vc's for improved use of RW and better posture. assist to maneuver RW.  Pt mild.y unsteady at times, wandered with the RW Gait Pattern: Step-through pattern;Decreased step length - right;Decreased step length - left;Decreased stride length;Trunk flexed Stairs: No    Exercises     PT Diagnosis:    PT Problem List:   PT Treatment Interventions:     PT Goals Acute Rehab PT Goals Time For Goal Achievement: 03/11/12 PT Goal: Sit to Supine/Side - Progress: Progressing toward goal PT Goal: Sit to Stand - Progress: Progressing toward goal PT Goal: Stand to Sit - Progress: Progressing toward goal PT Transfer Goal: Bed to Chair/Chair to Bed -  Progress: Progressing toward goal PT Goal: Ambulate - Progress: Progressing toward goal  Visit Information  Last PT Received On: 03/09/12 Assistance Needed: +1    Subjective Data  Subjective: She's never been sick like this... she'll be a go getter now.   Cognition  Overall Cognitive Status: Appears within functional limits for tasks assessed/performed Arousal/Alertness: Awake/alert Orientation Level: Appears intact for tasks assessed Behavior During Session: [facility] for tasks performed    Balance     End of Session PT - End of Session Equipment Utilized During Treatment: Gait belt Activity Tolerance: Patient tolerated treatment well Patient left: in bed;with call bell/phone within reach;with family/visitor present Nurse Communication: Mobility status   GP     Ellena Kamen, Eliseo Gum 03/09/2012, 4:34 PM  03/09/2012  [place] Bing, PT [phone] (pager)

## 2012-03-09 NOTE — Progress Notes (Signed)
Bladder scanned for 154cc as pt as not voided since foley d/c'd.  Dr Butler Denmark will order bolus and to recheck bladder scan if no void in a few hours after bolus. Will d/c ng tube per orders.Beryle Quant

## 2012-03-10 LAB — TYPE AND SCREEN
ABO/RH(D): A POS
Unit division: 0
Unit division: 0
Unit division: 0

## 2012-03-10 LAB — BASIC METABOLIC PANEL
CO2: 27 mEq/L (ref 19–32)
Calcium: 7.5 mg/dL — ABNORMAL LOW (ref 8.4–10.5)
GFR calc non Af Amer: 88 mL/min — ABNORMAL LOW (ref >90)
Glucose, Bld: 127 mg/dL — ABNORMAL HIGH (ref 70–99)
Potassium: 3.3 mEq/L — ABNORMAL LOW (ref 3.5–5.1)
Sodium: 142 mEq/L (ref 135–145)

## 2012-03-10 LAB — CBC
Hemoglobin: 7.8 g/dL — ABNORMAL LOW (ref 12.0–15.0)
MCH: 30.2 pg (ref 26.0–34.0)
Platelets: 190 10*3/uL (ref 150–400)
RBC: 2.58 MIL/uL — ABNORMAL LOW (ref 3.87–5.11)

## 2012-03-10 MED ORDER — SODIUM CHLORIDE 0.9 % IJ SOLN
INTRAMUSCULAR | Status: AC
  Filled 2012-03-10: qty 10

## 2012-03-10 MED ORDER — PANTOPRAZOLE SODIUM 40 MG PO TBEC
40.0000 mg | DELAYED_RELEASE_TABLET | Freq: Every day | ORAL | Status: DC
  Administered 2012-03-11 – 2012-03-17 (×6): 40 mg via ORAL
  Filled 2012-03-10 (×6): qty 1

## 2012-03-10 MED ORDER — METOPROLOL SUCCINATE ER 25 MG PO TB24
25.0000 mg | ORAL_TABLET | Freq: Every day | ORAL | Status: DC
  Administered 2012-03-10 – 2012-03-12 (×3): 25 mg via ORAL
  Filled 2012-03-10 (×4): qty 1

## 2012-03-10 MED ORDER — DEXTROMETHORPHAN POLISTIREX 30 MG/5ML PO LQCR
30.0000 mg | Freq: Two times a day (BID) | ORAL | Status: DC | PRN
  Filled 2012-03-10: qty 5

## 2012-03-10 MED ORDER — DILTIAZEM HCL ER COATED BEADS 180 MG PO CP24
180.0000 mg | ORAL_CAPSULE | Freq: Every day | ORAL | Status: DC
  Administered 2012-03-10 – 2012-03-12 (×3): 180 mg via ORAL
  Filled 2012-03-10 (×3): qty 1

## 2012-03-10 NOTE — Progress Notes (Signed)
[facility] Progress Note  03/10/2012 7:26 AM POD 7/3  Subjective:  No complaints; up in chair  Afebrile x 24 hrs   HR  70-120 irreg  110s - 140s systolic   90%2LO2NC Filed Vitals:   03/10/12 0700  BP:   Pulse:   Temp: 97.6 F (36.4 C)  Resp:      Physical Exam: Cardiac:  irreg Lungs:  CTAB Abdomen:  Decreased BS; non tender; slightly distended; denies N/V; + small BM yesterday; + flatus Incisions:  C/d/i Extremities:  + edema BLE  CBC    Component Value Date/Time   WBC 9.5 03/10/2012 0423   RBC 2.58* 03/10/2012 0423   HGB 7.8* 03/10/2012 0423   HCT 24.0* 03/10/2012 0423   PLT 190 03/10/2012 0423   MCV 93.0 03/10/2012 0423   MCH 30.2 03/10/2012 0423   MCHC 32.5 03/10/2012 0423   RDW 17.1* 03/10/2012 0423   LYMPHSABS 1.1 03/02/2012 2340   MONOABS 0.6 03/02/2012 2340   EOSABS 0.0 03/02/2012 2340   BASOSABS 0.0 03/02/2012 2340    BMET    Component Value Date/Time   NA 142 03/10/2012 0423   K 3.3* 03/10/2012 0423   CL 109 03/10/2012 0423   CO2 27 03/10/2012 0423   GLUCOSE 127* 03/10/2012 0423   BUN 9 03/10/2012 0423   CREATININE 0.49* 03/10/2012 0423   CALCIUM 7.5* 03/10/2012 0423   GFRNONAA 88* 03/10/2012 0423   GFRAA >90 03/10/2012 0423    INR    Component Value Date/Time   INR 1.23 03/09/2012 0500     Intake/Output Summary (Last 24 hours) at 03/10/12 0726 Last data filed at 03/10/12 0600  Gross per 24 hour  Intake 2886.66 ml  Output    600 ml  Net 2286.66 ml     Assessment/Plan:  78 y.o. female is s/p SMA embolectomy/exp lap for intraabdominal hematoma  POD 7/3  -pt doing well this am. -hgb down again-from 8.5 to 7.8-will most likely need transfusion today.  Received Vit K yesterday. -doing well from surgical standpoint-still with decreased BS, but tolerating clear liquids without difficulty.  Possibly slowly advance diet today-will d/w Dr. Edilia Bo.   Doreatha Massed, PA-C [facility] [phone] 03/10/2012 7:26 AM

## 2012-03-10 NOTE — Progress Notes (Signed)
Pt given scheduled lopressor dose.

## 2012-03-10 NOTE — Progress Notes (Signed)
VASCULAR PROGRESS NOTE  SUBJECTIVE: Passed flatus. No BM. Has been ambulating. Tolerated liquids.  PHYSICAL EXAM: Filed Vitals:   03/10/12 0018 03/10/12 0300 03/10/12 0329 03/10/12 0700  BP: 117/61  123/57   Pulse: 70  127   Temp:  98.2 F (36.8 C) 98.2 F (36.8 C) 97.6 F (36.4 C)  TempSrc:  Oral Oral Oral  Resp: 17  30   Height:      Weight:      SpO2: 99%  90%    Abd: has normal pitched BS Incision fine.   LABS: Lab Results  Component Value Date   WBC 9.5 03/10/2012   HGB 7.8* 03/10/2012   HCT 24.0* 03/10/2012   MCV 93.0 03/10/2012   PLT 190 03/10/2012   Lab Results  Component Value Date   CREATININE 0.49* 03/10/2012   Lab Results  Component Value Date   INR 1.23 03/09/2012   ASSESSMENT/PLAN:  1. POD 3: exp lap for intraabdominal hematoma. No active bleeding found.  2. POD 7 S/P SMA embolectomy  3. Advance diet slowly.  4. Consider restarting anticoagulation slowly on Saturday.   Waverly Ferrari, MD, FACS Beeper: [phone] 03/10/2012

## 2012-03-10 NOTE — Progress Notes (Signed)
Utilization review completed.  

## 2012-03-10 NOTE — Progress Notes (Signed)
[facility] Progress Note [place] TEAM 1 - Stepdown/ICU TEAM   MAIRA CHRISTON ZOX:[id number] DOB: 07-27-1928 DOA: 03/02/2012 PCP: Michele Mcalpine, MD  Assessment/Plan:  Acute Superior mesenteric artery thrombosis Felt to likely represent embolic phenomenon related to atrial fibrillation in that there were no preceding symptoms consistent with gut claudication - now status post embolectomy of the superior mesenteric artery with bovine pericardial patch angioplasty of superior mesenteric artery - holding anticoag as discussed below  Acute blood loss anemia with intra-abdominal hematoma In that Hgb continued to drift downward, CTabdom was accomplished - this revealed findings worrisome for intr-abdom bleeding - the pt was taken to the OR on 03/07/12 for an explor lap wch revealed an intra-abdom hematoma w/o evidence of active bleeding - no choice at this time but to hold heparin for 72 hours - goal should be to keep Hgb > 8 consistently  Coumadin induced coagulopathy Pt did receive coumadin on 7/14 and 7/15 - her INR is now increased as a result   ATRIAL FIBRILLATION, CHRONIC a transthoracic echo reveals no evidence of an intracardiac thrombus - RVR has been an issue- controlled with IV Cardizem infusion and IV Metoprolol Will switch to orals today as she has started full liquids  HYPERLIPIDEMIA Resume treatment when able to take oral meds/food consistently  Possible right lung nodule Will consider CT of the chest once the patient is more stable hemodynamically  ANGIODYSPLASIA, COLON with prior history of GI bleeding Patient was previously taken off of Coumadin due to known AVMs (pt now states she never actually had a GIB, but was simply found to have AVMs)   Code Status: Full Family Communication: communicated with the patient and family at the bedside Disposition Plan: Remain in step down unit until heart rate/A. fib better controlled and Hgb proven to be stable  Brief  narrative: 78 y.o. female with history of chronic atrial fibrillation (not on full anticoagulation due to AVM), anxiety, GERD, and hyperlipidemia, who presented to the emergency room complaining of the acute onset of abdominal pain for 2 days, accompanied with nausea and vomiting along with watery diarrhea. She denied any recent antibiotic. She denied black stool or bloody stool. She denied any fever or chills. Evaluation in emergency room included an abdominal pelvic CT which revealed a superior mesenteric artery non-occlusive thrombus, but no evidence of ischemic bowel (along with incidental finding of a small pulmonary nodule and calcification in the aorta).  Consultants: Vascular surgery GI - telephone consultation  Procedures: 03/03/2012 - embolectomy of the superior mesenteric artery with bovine pericardial patch angioplasty of superior mesenteric artery  03/07/2012 - exploratory laparotomy with evacuation of intra-abdom hematoma  03/04/2012 2D Echo - no evidence of intracardiac thrombus - preserved LV systolic fxn  Antibiotics: None   HPI/Subjective: Not yet moving her bowels post surgery #2.  No cp, sob, n/v, f/c.  Alert and conversant.   Objective: Blood pressure 144/62, pulse 96, temperature 97.6 F (36.4 C), temperature source Oral, resp. rate 18, height 5\' 5"  (1.651 m), weight 68.8 kg (151 lb 10.8 oz), SpO2 93.00%.  Intake/Output Summary (Last 24 hours) at 03/10/12 2205 Last data filed at 03/10/12 1800  Gross per 24 hour  Intake   2200 ml  Output    700 ml  Net   1500 ml     Exam: General: No acute respiratory distress Lungs: Clear to auscultation bilaterally without wheezes or crackles Cardiovascular: Irregularly irregular without gallop or rub - rate ~130 Abdomen: Midline incision dressed and  dry, bowel sounds present, mildly tender to soft palpation throughout, nondistended, no rebound Extremities: No significant cyanosis, clubbing, or edema bilateral lower  extremities  Data Reviewed: Basic Metabolic Panel:  Lab 03/10/12 1610 03/08/12 0500 03/07/12 2027 03/07/12 0421 03/05/12 0430 03/04/12 0426  NA 142 140 140 144 139 --  K 3.3* 3.8 4.0 3.2* 4.0 --  CL 109 107 107 111 109 --  CO2 27 25 24 25 22  --  GLUCOSE 127* 162* 131* 119* 141* --  BUN 9 11 12 10 9  --  CREATININE 0.49* 0.43* 0.49* 0.54 0.52 --  CALCIUM 7.5* 7.2* 7.5* 7.5* 7.9* --  MG -- 1.7 1.7 -- 1.9 1.6  PHOS -- 1.6* -- -- -- --   Liver Function Tests:  Lab 03/08/12 0500 03/04/12 0426  AST 35 28  ALT 22 18  ALKPHOS 55 46  BILITOT 0.8 0.4  PROT 4.5* 5.4*  ALBUMIN 2.0* 2.7*    Lab 03/08/12 0500 03/04/12 0426  LIPASE -- --  AMYLASE 27 30   CBC:  Lab 03/10/12 0423 03/09/12 0500 03/08/12 0500 03/07/12 2030 03/07/12 1339  WBC 9.5 11.2* 11.4* 10.8* 11.3*  NEUTROABS -- -- -- -- --  HGB 7.8* 8.5* 9.0* 9.8* 8.0*  HCT 24.0* 25.9* 26.9* 28.4* 24.0*  MCV 93.0 92.2 89.4 90.7 93.8  PLT 190 199 194 209 207   Cardiac Enzymes: No results found for this basename: CKTOTAL:5,CKMB:5,CKMBINDEX:5,TROPONINI:5 in the last 168 hours  Recent Results (from the past 240 hour(s))  MRSA PCR SCREENING     Status: Normal   Collection Time   03/03/12  8:54 PM      Component Value Range Status Comment   MRSA by PCR NEGATIVE  NEGATIVE Final      Studies:  Recent x-ray studies have been reviewed in detail by the Attending Physician  Scheduled Meds:  Reviewed in detail by the Attending Physician   Calvert Cantor, MD [facility] Office  [phone] Pager [phone]  On-Call/Text Page:      [email or web address]      password [email or web address]  If 7PM-7AM, please contact night-coverage [email or web address] Password [email or web address] 03/10/2012, 10:05 PM   LOS: 8 days

## 2012-03-10 NOTE — Progress Notes (Addendum)
Patient was able to be weaned off O2 prior to ambulation. Tolerated well during ambulation and has maintained sats. Lisa Douglas E

## 2012-03-11 LAB — CBC
Hemoglobin: 8 g/dL — ABNORMAL LOW (ref 12.0–15.0)
MCH: 30 pg (ref 26.0–34.0)
MCV: 93.3 fL (ref 78.0–100.0)
Platelets: 199 10*3/uL (ref 150–400)
RBC: 2.67 MIL/uL — ABNORMAL LOW (ref 3.87–5.11)
WBC: 8.3 10*3/uL (ref 4.0–10.5)

## 2012-03-11 MED ORDER — PROSIGHT PO TABS
1.0000 | ORAL_TABLET | Freq: Every day | ORAL | Status: DC
  Administered 2012-03-11 – 2012-03-17 (×7): 1 via ORAL
  Filled 2012-03-11 (×8): qty 1

## 2012-03-11 MED ORDER — ADULT MULTIVITAMIN W/MINERALS CH
1.0000 | ORAL_TABLET | Freq: Every day | ORAL | Status: DC
  Administered 2012-03-11 – 2012-03-17 (×8): 1 via ORAL
  Filled 2012-03-11 (×8): qty 1

## 2012-03-11 MED ORDER — OCUVITE-LUTEIN PO CAPS: 1.0000 | ORAL_CAPSULE | Freq: Every day | ORAL | Status: DC

## 2012-03-11 NOTE — Progress Notes (Addendum)
[facility] Progress Note  03/11/2012 7:52 AM POD 8 from embolectomy of the superior mesenteric artery with bovine pericardial patch angioplasty of superior mesenteric artery POD 4 from exploratory laparotomy evacuation of intra-abdominal hematoma   Subjective: Passed flatus. No BM. Has been ambulating. Tolerated liquids.  No N/V.    Filed Vitals:   03/11/12 0400  BP: 134/48  Pulse: 86  Temp: 97.8 F (36.6 C)  Resp: 18     Physical Exam: Cardiac:  Irregularly irregular  Abdomen:  Soft with incisional tenderness Incisions:  Clean , dry and intact with minimal ecchymosis. Extremities:  Active range of motion all four extremities.  Foot and ankle edema bilaterally.    CBC    Component Value Date/Time   WBC 8.3 03/11/2012 0355   RBC 2.67* 03/11/2012 0355   HGB 8.0* 03/11/2012 0355   HCT 24.9* 03/11/2012 0355   PLT 199 03/11/2012 0355   MCV 93.3 03/11/2012 0355   MCH 30.0 03/11/2012 0355   MCHC 32.1 03/11/2012 0355   RDW 17.0* 03/11/2012 0355   LYMPHSABS 1.1 03/02/2012 2340   MONOABS 0.6 03/02/2012 2340   EOSABS 0.0 03/02/2012 2340   BASOSABS 0.0 03/02/2012 2340    BMET    Component Value Date/Time   NA 142 03/10/2012 0423   K 3.3* 03/10/2012 0423   CL 109 03/10/2012 0423   CO2 27 03/10/2012 0423   GLUCOSE 127* 03/10/2012 0423   BUN 9 03/10/2012 0423   CREATININE 0.49* 03/10/2012 0423   CALCIUM 7.5* 03/10/2012 0423   GFRNONAA 88* 03/10/2012 0423   GFRAA >90 03/10/2012 0423    INR    Component Value Date/Time   INR 1.23 03/09/2012 0500     Intake/Output Summary (Last 24 hours) at 03/11/12 0752 Last data filed at 03/11/12 0500  Gross per 24 hour  Intake   1940 ml  Output    850 ml  Net   1090 ml     Assessment/Plan:  78 y.o. female is s/p    SMA embolectomy/exp lap for intraabdominal hematoma  POD 8/4 Advance diet slowly  I will discuss anticoagulation with Dr. Hart Rochester this morning.   Lisa Douglas Lisa Douglas , [place] [facility] [phone] 03/11/2012 7:52 AM  Agree with above Abdomen mildly distended-no bowel movement yet Currently on full liquids tolerating well Vital signs stable-remains in atrial fib with rate of 110-120  Plan continue ambulation Duplex suppository Okay to resume anticoagulation slowly if cardiology desires  Possible transfer to floor in a.m.

## 2012-03-11 NOTE — Progress Notes (Signed)
Pt given PO Cardizem and PO lopressor per order. Cardizem gtt turned off at 2200. Pt VVS, see doc flow sheets. Will continue to monitor.  Leta Baptist, RN

## 2012-03-11 NOTE — Progress Notes (Signed)
[facility] Progress Note [place] TEAM 1 - Stepdown/ICU TEAM   Lisa Douglas YTK:[id number] DOB: Aug 21, 1928 DOA: 03/02/2012 PCP: Michele Mcalpine, MD  Assessment/Plan:  Acute Superior mesenteric artery thrombosis Felt to likely represent embolic phenomenon related to atrial fibrillation in that there were no preceding symptoms consistent with gut claudication - now status post embolectomy of the superior mesenteric artery with bovine pericardial patch angioplasty of superior mesenteric artery - will resume anticoagulation today   Acute blood loss anemia with intra-abdominal hematoma In that Hgb continued to drift downward, CTabdom was accomplished - this revealed findings worrisome for intr-abdom bleeding - the pt was taken to the OR on 03/07/12 for an explor lap wch revealed an intra-abdom hematoma w/o evidence of active bleeding -   Coumadin induced coagulopathy resolved  ATRIAL FIBRILLATION, CHRONIC a transthoracic echo reveals no evidence of an intracardiac thrombus - RVR has been an issue- controlled with IV Cardizem infusion and IV Metoprolol Tolerating oral meds and off Cardizem infusion  HYPERLIPIDEMIA Resume treatment when able to take oral meds/food consistently  Possible right lung nodule Will consider CT of the chest once the patient is more stable hemodynamically  ANGIODYSPLASIA, COLON with prior history of GI bleeding Patient was previously taken off of Coumadin due to known AVMs (pt now states she never actually had a GIB, but was simply found to have AVMs)   Code Status: Full Family Communication: communicated with the patient and family at the bedside Disposition Plan: tranfger to surgical tele  Brief narrative: 78 y.o. female with history of chronic atrial fibrillation (not on full anticoagulation due to AVM), anxiety, GERD, and hyperlipidemia, who presented to the emergency room complaining of the acute onset of abdominal pain for 2 days, accompanied with  nausea and vomiting along with watery diarrhea. She denied any recent antibiotic. She denied black stool or bloody stool. She denied any fever or chills. Evaluation in emergency room included an abdominal pelvic CT which revealed a superior mesenteric artery non-occlusive thrombus, but no evidence of ischemic bowel (along with incidental finding of a small pulmonary nodule and calcification in the aorta).  Consultants: Vascular surgery GI - telephone consultation  Procedures: 03/03/2012 - embolectomy of the superior mesenteric artery with bovine pericardial patch angioplasty of superior mesenteric artery  03/07/2012 - exploratory laparotomy with evacuation of intra-abdom hematoma  03/04/2012 2D Echo - no evidence of intracardiac thrombus - preserved LV systolic fxn  Antibiotics: None   HPI/Subjective: Not yet moving her bowels post surgery #2.  No cp, sob, n/v, f/c.  Alert and conversant.   Objective: Blood pressure 131/61, pulse 87, temperature 97.9 F (36.6 C), temperature source Oral, resp. rate 23, height 5\' 5"  (1.651 m), weight 68.8 kg (151 lb 10.8 oz), SpO2 96.00%.  Intake/Output Summary (Last 24 hours) at 03/11/12 1944 Last data filed at 03/11/12 1700  Gross per 24 hour  Intake   1760 ml  Output   1025 ml  Net    735 ml     Exam: General: No acute respiratory distress Lungs: Clear to auscultation bilaterally without wheezes or crackles Cardiovascular: Irregularly irregular without gallop or rub - rate ~130 Abdomen: Midline incision dressed and dry, bowel sounds present, mildly tender to soft palpation throughout, nondistended, no rebound Extremities: No significant cyanosis, clubbing, or edema bilateral lower extremities  Data Reviewed: Basic Metabolic Panel:  Lab 03/10/12 5573 03/08/12 0500 03/07/12 2027 03/07/12 0421 03/05/12 0430  NA 142 140 140 144 139  K 3.3* 3.8 4.0 3.2* 4.0  CL 109 107 107 111 109  CO2 27 25 24 25 22   GLUCOSE 127* 162* 131* 119* 141*  BUN  9 11 12 10 9   CREATININE 0.49* 0.43* 0.49* 0.54 0.52  CALCIUM 7.5* 7.2* 7.5* 7.5* 7.9*  MG -- 1.7 1.7 -- 1.9  PHOS -- 1.6* -- -- --   Liver Function Tests:  Lab 03/08/12 0500  AST 35  ALT 22  ALKPHOS 55  BILITOT 0.8  PROT 4.5*  ALBUMIN 2.0*    Lab 03/08/12 0500  LIPASE --  AMYLASE 27   CBC:  Lab 03/11/12 0355 03/10/12 0423 03/09/12 0500 03/08/12 0500 03/07/12 2030  WBC 8.3 9.5 11.2* 11.4* 10.8*  NEUTROABS -- -- -- -- --  HGB 8.0* 7.8* 8.5* 9.0* 9.8*  HCT 24.9* 24.0* 25.9* 26.9* 28.4*  MCV 93.3 93.0 92.2 89.4 90.7  PLT 199 190 199 194 209   Cardiac Enzymes: No results found for this basename: CKTOTAL:5,CKMB:5,CKMBINDEX:5,TROPONINI:5 in the last 168 hours  Recent Results (from the past 240 hour(s))  MRSA PCR SCREENING     Status: Normal   Collection Time   03/03/12  8:54 PM      Component Value Range Status Comment   MRSA by PCR NEGATIVE  NEGATIVE Final      Studies:  Recent x-ray studies have been reviewed in detail by the Attending Physician  Scheduled Meds:  Reviewed in detail by the Attending Physician   Calvert Cantor, MD [facility] Office  [phone] Pager [phone]  On-Call/Text Page:      [email or web address]      password [email or web address]  If 7PM-7AM, please contact night-coverage [email or web address] Password [email or web address] 03/11/2012, 7:44 PM   LOS: 9 days

## 2012-03-12 DIAGNOSIS — D62 Acute posthemorrhagic anemia: Secondary | ICD-10-CM

## 2012-03-12 DIAGNOSIS — R58 Hemorrhage, not elsewhere classified: Secondary | ICD-10-CM

## 2012-03-12 HISTORY — DX: Acute posthemorrhagic anemia: D62

## 2012-03-12 LAB — CBC
HCT: 25.8 % — ABNORMAL LOW (ref 36.0–46.0)
Hemoglobin: 8.5 g/dL — ABNORMAL LOW (ref 12.0–15.0)
MCV: 93.5 fL (ref 78.0–100.0)
WBC: 7 10*3/uL (ref 4.0–10.5)

## 2012-03-12 MED ORDER — WARFARIN SODIUM 2 MG PO TABS
2.0000 mg | ORAL_TABLET | Freq: Once | ORAL | Status: AC
  Administered 2012-03-13: 2 mg via ORAL
  Filled 2012-03-12: qty 1

## 2012-03-12 MED ORDER — POTASSIUM CHLORIDE CRYS ER 20 MEQ PO TBCR
40.0000 meq | EXTENDED_RELEASE_TABLET | Freq: Once | ORAL | Status: AC
  Administered 2012-03-12: 40 meq via ORAL
  Filled 2012-03-12: qty 2

## 2012-03-12 MED ORDER — DILTIAZEM HCL ER COATED BEADS 240 MG PO CP24
240.0000 mg | ORAL_CAPSULE | Freq: Every day | ORAL | Status: DC
  Administered 2012-03-13 – 2012-03-17 (×5): 240 mg via ORAL
  Filled 2012-03-12 (×5): qty 1

## 2012-03-12 MED ORDER — FUROSEMIDE 10 MG/ML IJ SOLN
40.0000 mg | Freq: Once | INTRAMUSCULAR | Status: DC
  Filled 2012-03-12 (×2): qty 4

## 2012-03-12 MED ORDER — WARFARIN - PHARMACIST DOSING INPATIENT
Freq: Every day | Status: DC
  Administered 2012-03-14: 18:00:00

## 2012-03-12 MED ORDER — ENSURE COMPLETE PO LIQD
237.0000 mL | Freq: Three times a day (TID) | ORAL | Status: DC
  Administered 2012-03-12 – 2012-03-17 (×8): 237 mL via ORAL

## 2012-03-12 MED ORDER — DILTIAZEM HCL ER COATED BEADS 120 MG PO CP24
120.0000 mg | ORAL_CAPSULE | Freq: Once | ORAL | Status: AC
  Administered 2012-03-12: 120 mg via ORAL
  Filled 2012-03-12: qty 1

## 2012-03-12 MED ORDER — METOPROLOL TARTRATE 50 MG PO TABS
50.0000 mg | ORAL_TABLET | Freq: Two times a day (BID) | ORAL | Status: DC
  Administered 2012-03-12 – 2012-03-13 (×3): 50 mg via ORAL
  Filled 2012-03-12 (×4): qty 1

## 2012-03-12 NOTE — Progress Notes (Signed)
PCP: Michele Mcalpine, MD  Brief HPI:  78 y.o. female with history of chronic atrial fibrillation (not on full anticoagulation due to AVM), anxiety, GERD, and hyperlipidemia, who presented to the emergency room complaining of the acute onset of abdominal pain for 2 days, accompanied with nausea and vomiting along with watery diarrhea. She denied any recent antibiotic. She denied black stool or bloody stool. She denied any fever or chills. Evaluation in emergency room included an abdominal pelvic CT which revealed a superior mesenteric artery non-occlusive thrombus, but no evidence of ischemic bowel (along with incidental finding of a small pulmonary nodule and calcification in the aorta).  Past medical history:  Past Medical History  Diagnosis Date  . Macular degeneration (senile) of retina, unspecified   . Atrial fibrillation   . Unspecified venous (peripheral) insufficiency   . Other and unspecified hyperlipidemia   . Esophageal reflux   . Diverticulosis of colon (without mention of hemorrhage)   . Angiodysplasia of intestine (without mention of hemorrhage)   . Osteoarthrosis, unspecified whether generalized or localized, unspecified site   . Osteoporosis, unspecified   . Anxiety state, unspecified     Consultants:  Vascular surgery  [facility] - telephone consultation [facility]: Over the phone  Procedures:  03/03/2012 - embolectomy of the superior mesenteric artery with bovine pericardial patch angioplasty of superior mesenteric artery  03/07/2012 - exploratory laparotomy with evacuation of intra-abdom hematoma   Subjective: Patient denies any pain. No nausea. Some SOB with exertion. No CP.  Objective: Vital signs in last 24 hours: Temp:  [97.6 F (36.4 C)-98.7 F (37.1 C)] 97.6 F (36.4 C) (07/21 0430) Pulse Rate:  [87-130] 130  (07/21 0430) Resp:  [20-23] 20  (07/21 0430) BP: (130-150)/(61-84) 130/78 mmHg (07/21 1043) SpO2:  [91 %-96 %] 91 % (07/21 0430) Weight:  [82.5  kg (181 lb 14.1 oz)] 82.5 kg (181 lb 14.1 oz) (07/20 2034) Weight change:  Last BM Date: 03/12/12  Intake/Output from previous day: 07/20 0701 - 07/21 0700 In: 1090 [P.O.:640; I.V.:450] Out: 478 [Urine:477; Stool:1] Intake/Output this shift: Total I/O In: 240 [P.O.:240] Out: 1 [Urine:1]  General appearance: alert, cooperative, appears stated age and no distress Head: Normocephalic, without obvious abnormality, atraumatic Back: symmetric, no curvature. ROM normal. No CVA tenderness. Resp: clear to auscultation bilaterally Cardio: irregularly irregular, tachycardic. No murmur, click, rub or gallop GI: soft, non-tender; bowel sounds normal; no masses,  no organomegaly. Incision site appears to be clean Extremities: extremities normal, atraumatic, no cyanosis. Pitting edema is noted. Pulses: 2+ and symmetric Skin: Skin color, texture, turgor normal. No rashes or lesions Lymph nodes: Cervical, supraclavicular, and axillary nodes normal. Neurologic: Alert and oriented. No focal deficits.  Lab Results:  Basename 03/12/12 0545 03/11/12 0355  WBC 7.0 8.3  HGB 8.5* 8.0*  HCT 25.8* 24.9*  PLT 204 199   BMET  Basename 03/10/12 0423  NA 142  K 3.3*  CL 109  CO2 27  GLUCOSE 127*  BUN 9  CREATININE 0.49*  CALCIUM 7.5*  ALT --    Studies/Results: No results found.  Medications:  Scheduled:    . diltiazem  120 mg Oral Once  . diltiazem  240 mg Oral Daily  . feeding supplement  237 mL Oral TID BM  . furosemide  40 mg Intravenous Once  . metoprolol tartrate  50 mg Oral BID  . multivitamin  1 tablet Oral Daily  . multivitamin with minerals  1 tablet Oral Daily  . pantoprazole  40  mg Oral QAC breakfast  . DISCONTD: diltiazem  180 mg Oral Daily  . DISCONTD: metoprolol succinate  25 mg Oral Daily  . DISCONTD: multivitamin-lutein  1 capsule Oral Daily   Continuous:    . DISCONTD: dextrose 5 % and 0.9 % NaCl with KCl 20 mEq/L 50 mL/hr at 03/10/12 2228    AVW:[id number], bisacodyl, dextromethorphan, HYDROmorphone (DILAUDID) injection, metoprolol, ondansetron, phenol, sodium chloride  Assessment/Plan:  Principal Problem:  *Superior mesenteric artery thrombosis Active Problems:  HYPERLIPIDEMIA  ATRIAL FIBRILLATION, CHRONIC  ANGIODYSPLASIA, COLON  Retroperitoneal hematoma  Anemia due to blood loss, acute  Atrial fibrillation with RVR  Malnutrition of moderate degree  Pedal edema    Acute Superior mesenteric artery thrombosis  Felt to likely represent embolic phenomenon related to atrial fibrillation in that there were no preceding symptoms consistent with gut claudication - now status post embolectomy of the superior mesenteric artery with bovine pericardial patch angioplasty of superior mesenteric artery. Anticoagulation discussed with Dr. Shirlee Latch with [facility] and he recommends Warfarin instead of alternative agents due to ease of reversal. Will aim for INR between 2 and 2.5.  Acute blood loss anemia with intra-abdominal hematoma  In that Hgb continued to drift downward, CTabdom was accomplished - this revealed findings worrisome for intr-abdom bleeding - the pt was taken to the OR on 03/07/12 for an explor lap wch revealed an intra-abdom hematoma w/o evidence of active bleeding. Hgb is stable.  ATRIAL FIBRILLATION, CHRONIC with RVR Have discussed with Dr, Shirlee Latch and he recommends increasing dose of her BB and CCB. Will do the same. Patient remains asymptomatic. A transthoracic echo reveals no evidence of an intracardiac thrombus.  Pedal edema She has been in positive fluid balance during this hospitalization. Will giver her Lasix IV for now.   HYPERLIPIDEMIA  Resume treatment when able to take oral meds/food consistently   Malnutrition Ensure. Encourage PO intake.  Possible right lung nodule  This can be pursued as an OP by her PCP   ANGIODYSPLASIA, COLON with prior history of GI bleeding  Patient was previously taken off  of Coumadin due to known AVMs (pt now states she never actually had a GIB, but was simply found to have AVMs)   DVT Prophylaxis SCD's  Code Status: Full  Family Communication: communicated with the patient and family at the bedside  Disposition Plan: Per PT/OT. Will need to watch for bleeding as her INR becomes therapeutic.     LOS: 10 days   [facility]  [facility] Pager [phone] 03/12/2012, 11:25 AM

## 2012-03-12 NOTE — Progress Notes (Signed)
ANTICOAGULATION CONSULT NOTE - Follow Up Consult  Pharmacy Consult for Coumadin Indication: atrial fibrillation and SMA embolus s/p embolectomy  Allergies  Allergen Reactions  . Penicillins     angioedema  . Alendronate Sodium     REACTION: INTOL to Fosamax per pt.  . Atorvastatin     REACTION: INTOL to Lipitor per pt.    Patient Measurements: Height: 5\' 5"  (165.1 cm) Weight: 181 lb 14.1 oz (82.5 kg) IBW/kg (Calculated) : 57    Vital Signs: Temp: 98 F (36.7 C) (07/21 1327) Temp src: Oral (07/21 1327) BP: 133/74 mmHg (07/21 1327) Pulse Rate: 112  (07/21 1327)  Labs:  Basename 03/12/12 0545 03/11/12 0355 03/10/12 0423  HGB 8.5* 8.0* --  HCT 25.8* 24.9* 24.0*  PLT 204 199 190  APTT -- -- --  LABPROT -- -- --  INR -- -- --  HEPARINUNFRC -- -- --  CREATININE -- -- 0.49*  CKTOTAL -- -- --  CKMB -- -- --  TROPONINI -- -- --    Estimated Creatinine Clearance: 57.5 ml/min (by C-G formula based on Cr of 0.49).   Assessment: [name] is known to pharmacy from prior management of heparin/Coumadin. She is POD9 from embolectomy of SMA with patch angioplasty and POD5 from exp lap evacuation of intraabdominal hematoma. She has a hx of Afib, not previously on chronic anticoag d/t hx of AVMs. Now to begin Coumadin d/t concern for afib related SMA embolus. Noted H/H decreased d/t ABLA post-op/hematoma, Plts are WNL.  INR was 1.23 on 7/18. Vit K 5mg  IV given 7/16 for INR elevation s/p just one dose of Coumadin 4mg  on 7/15.  Goal of Therapy:  INR 2-2.5 Monitor platelets by anticoagulation protocol: Yes   Plan:  - Coumadin 2 mg po x 1 today - Will f/up daily INR - D/w Dr. Rito Ehrlich, no plans for low dose LMWH d/t recent hematoma evac  Mada Sadik K. Allena Katz, PharmD, BCPS.  Clinical Pharmacist Pager [phone]. 03/12/2012 2:38 PM

## 2012-03-12 NOTE — Progress Notes (Signed)
Patient ID: Lisa Douglas, female   DOB: [birth year], 78 y.o.   MRN: [id number] Vascular Surgery Progress Note  Subjective: Continues to improve post SMA embolectomy with patch angioplasty and reexploration Having some loose stools Tolerating full liquids well Beginning to ambulate independently No blood per loose stools Denies abdominal pain  Objective:  Filed Vitals:   03/12/12 0430  BP: 150/84  Pulse: 130  Temp: 97.6 F (36.4 C)  Resp: 20    General alert and oriented x3 Lungs no rhonchi or wheezing Abdomen mildly distended nontender    Labs:  Lab 03/10/12 0423 03/08/12 0500 03/07/12 2027  CREATININE 0.49* 0.43* 0.49*    Lab 03/10/12 0423 03/08/12 0500 03/07/12 2027  NA 142 140 140  K 3.3* 3.8 4.0  CL 109 107 107  CO2 27 25 24   BUN 9 11 12   CREATININE 0.49* 0.43* 0.49*  LABGLOM -- -- --  GLUCOSE 127* -- --  CALCIUM 7.5* 7.2* 7.5*    Lab 03/12/12 0545 03/11/12 0355 03/10/12 0423  WBC 7.0 8.3 9.5  HGB 8.5* 8.0* 7.8*  HCT 25.8* 24.9* 24.0*  PLT 204 199 190    Lab 03/09/12 0500 03/08/12 0500 03/07/12 2027  INR 1.23 2.18* 1.90*    I/O last 3 completed shifts: In: 1760 [P.O.:640; I.V.:1120] Out: 1028 [Urine:1027; Stool:1]  Imaging: No results found.  Assessment/Plan:  POD #10  LOS: 10 days  s/p Procedure(s): EXPLORATORY LAPAROTOMY  Patient has had resumption of anti-coagulation Okay to increase to regular diet Continue to watch loose bowel movements Generally doing well   Josephina Gip, MD 03/12/2012 9:05 AM

## 2012-03-13 DIAGNOSIS — R609 Edema, unspecified: Secondary | ICD-10-CM

## 2012-03-13 LAB — BASIC METABOLIC PANEL
GFR calc non Af Amer: 83 mL/min — ABNORMAL LOW (ref >90)
Glucose, Bld: 88 mg/dL (ref 70–99)
Potassium: 3.2 mEq/L — ABNORMAL LOW (ref 3.5–5.1)
Sodium: 140 mEq/L (ref 135–145)

## 2012-03-13 LAB — CBC
Hemoglobin: 9.1 g/dL — ABNORMAL LOW (ref 12.0–15.0)
MCHC: 32.4 g/dL (ref 30.0–36.0)
Platelets: 236 10*3/uL (ref 150–400)
RBC: 3 MIL/uL — ABNORMAL LOW (ref 3.87–5.11)

## 2012-03-13 LAB — PROTIME-INR
INR: 1.13 (ref 0.00–1.49)
Prothrombin Time: 14.7 seconds (ref 11.6–15.2)

## 2012-03-13 LAB — CLOSTRIDIUM DIFFICILE BY PCR: Toxigenic C. Difficile by PCR: NEGATIVE

## 2012-03-13 MED ORDER — METOPROLOL TARTRATE 50 MG PO TABS
75.0000 mg | ORAL_TABLET | Freq: Two times a day (BID) | ORAL | Status: DC
  Administered 2012-03-13 – 2012-03-17 (×8): 75 mg via ORAL
  Filled 2012-03-13 (×9): qty 1

## 2012-03-13 MED ORDER — WARFARIN SODIUM 2 MG PO TABS
2.0000 mg | ORAL_TABLET | Freq: Once | ORAL | Status: AC
  Filled 2012-03-13: qty 1

## 2012-03-13 MED ORDER — POTASSIUM CHLORIDE CRYS ER 20 MEQ PO TBCR
40.0000 meq | EXTENDED_RELEASE_TABLET | ORAL | Status: AC
  Administered 2012-03-13 (×2): 40 meq via ORAL
  Filled 2012-03-13 (×2): qty 1
  Filled 2012-03-13: qty 2

## 2012-03-13 MED ORDER — FUROSEMIDE 10 MG/ML IJ SOLN
40.0000 mg | Freq: Two times a day (BID) | INTRAMUSCULAR | Status: DC
  Administered 2012-03-14: 40 mg via INTRAVENOUS
  Filled 2012-03-13 (×2): qty 4

## 2012-03-13 MED ORDER — FUROSEMIDE 10 MG/ML IJ SOLN
40.0000 mg | Freq: Two times a day (BID) | INTRAMUSCULAR | Status: DC
  Administered 2012-03-13: 40 mg via INTRAVENOUS
  Filled 2012-03-13 (×2): qty 4

## 2012-03-13 NOTE — Progress Notes (Signed)
Pt ambulated 300 feet with rolling walker. Tolerated well.

## 2012-03-13 NOTE — Progress Notes (Addendum)
[facility] Progress Note  03/13/2012 8:05 AM POD 10/6  Subjective:  Wants to go home.  VSS Filed Vitals:   03/13/12 0529  BP: 126/66  Pulse: 80  Temp: 98.7 F (37.1 C)  Resp: 18     Physical Exam: Cardiac:  irreg Lungs:  Non labored Abdomen:  Slight distension; denies N/V Incisions:  C/d/i-healing well   CBC    Component Value Date/Time   WBC 8.3 03/13/2012 0500   RBC 3.00* 03/13/2012 0500   HGB 9.1* 03/13/2012 0500   HCT 28.1* 03/13/2012 0500   PLT 236 03/13/2012 0500   MCV 93.7 03/13/2012 0500   MCH 30.3 03/13/2012 0500   MCHC 32.4 03/13/2012 0500   RDW 16.8* 03/13/2012 0500   LYMPHSABS 1.1 03/02/2012 2340   MONOABS 0.6 03/02/2012 2340   EOSABS 0.0 03/02/2012 2340   BASOSABS 0.0 03/02/2012 2340    BMET    Component Value Date/Time   NA 140 03/13/2012 0500   K 3.2* 03/13/2012 0500   CL 102 03/13/2012 0500   CO2 28 03/13/2012 0500   GLUCOSE 88 03/13/2012 0500   BUN 5* 03/13/2012 0500   CREATININE 0.59 03/13/2012 0500   CALCIUM 8.0* 03/13/2012 0500   GFRNONAA 83* 03/13/2012 0500   GFRAA >90 03/13/2012 0500    INR    Component Value Date/Time   INR 1.13 03/13/2012 0500     Intake/Output Summary (Last 24 hours) at 03/13/12 0805 Last data filed at 03/12/12 1300  Gross per 24 hour  Intake    480 ml  Output      2 ml  Net    478 ml     Assessment/Plan:  78 y.o. female is s/p SMA embolectomy and re-exploration  POD 10/8  -doing well from surgical standpoint.   -coumadin per primary team -continue PT/OT  Doreatha Massed, PA-C [facility] [phone] 03/13/2012 8:05 AM  Agree with above.  Ok for discharge soon if Coumadin can be managed as an out-pt. She is requesting a walker.  Di Kindle. Edilia Bo, MD, FACS Beeper [phone] 03/13/2012

## 2012-03-13 NOTE — Progress Notes (Signed)
ANTICOAGULATION CONSULT NOTE - Follow Up Consult  Pharmacy Consult for Coumadin Indication: atrial fibrillation and SMA embolus s/p embolectomy  Allergies  Allergen Reactions  . Penicillins     angioedema  . Alendronate Sodium     REACTION: INTOL to Fosamax per pt.  . Atorvastatin     REACTION: INTOL to Lipitor per pt.   Labs:  Basename 03/13/12 0500 03/12/12 0545 03/11/12 0355  HGB 9.1* 8.5* --  HCT 28.1* 25.8* 24.9*  PLT 236 204 199  APTT -- -- --  LABPROT 14.7 -- --  INR 1.13 -- --  HEPARINUNFRC -- -- --  CREATININE 0.59 -- --  CKTOTAL -- -- --  CKMB -- -- --  TROPONINI -- -- --    Estimated Creatinine Clearance: 57.5 ml/min (by C-G formula based on Cr of 0.59).   Assessment: Ms. Truss is known to pharmacy from prior management of heparin/Coumadin. She is POD10 from embolectomy of SMA with patch angioplasty and POD8 from exp lap evacuation of intraabdominal hematoma. She has a hx of Afib, not previously on chronic anticoag d/t hx of AVMs. Now to begin Coumadin d/t concern for afib related SMA embolus.  INR=1.13 today  Goal of Therapy:  INR 2-2.5 Monitor platelets by anticoagulation protocol: Yes   Plan:  - Repeat Coumadin 2 mg po x 1 today - Will f/up daily INR  Okey Regal, PharmD [phone]  -03/13/2012 8:48 AM

## 2012-03-13 NOTE — Progress Notes (Signed)
PCP: Michele Mcalpine, MD  Brief HPI:  78 y.o. female with history of chronic atrial fibrillation (not on full anticoagulation due to AVM), anxiety, GERD, and hyperlipidemia, who presented to the emergency room complaining of the acute onset of abdominal pain for 2 days, accompanied with nausea and vomiting along with watery diarrhea. She denied any recent antibiotic. She denied black stool or bloody stool. She denied any fever or chills. Evaluation in emergency room included an abdominal pelvic CT which revealed a superior mesenteric artery non-occlusive thrombus, but no evidence of ischemic bowel (along with incidental finding of a small pulmonary nodule and calcification in the aorta).  Past medical history:  Past Medical History  Diagnosis Date  . Macular degeneration (senile) of retina, unspecified   . Atrial fibrillation   . Unspecified venous (peripheral) insufficiency   . Other and unspecified hyperlipidemia   . Esophageal reflux   . Diverticulosis of colon (without mention of hemorrhage)   . Angiodysplasia of intestine (without mention of hemorrhage)   . Osteoarthrosis, unspecified whether generalized or localized, unspecified site   . Osteoporosis, unspecified   . Anxiety state, unspecified     Consultants:  Vascular surgery  [facility] - telephone consultation [facility]: Over the phone  Procedures:  03/03/2012 - embolectomy of the superior mesenteric artery with bovine pericardial patch angioplasty of superior mesenteric artery  03/07/2012 - exploratory laparotomy with evacuation of intra-abdom hematoma   Subjective: Patient feels well. Having loose stools. No nausea. Hasn't ambulated much. No pain.  Objective: Vital signs in last 24 hours: Temp:  [98 F (36.7 C)-98.7 F (37.1 C)] 98.7 F (37.1 C) (07/22 0529) Pulse Rate:  [80-112] 80  (07/22 0529) Resp:  [16-18] 18  (07/22 0529) BP: (126-133)/(66-74) 126/66 mmHg (07/22 0529) SpO2:  [92 %-94 %] 94 % (07/22  0529) Weight change:  Last BM Date: 03/12/12  Intake/Output from previous day: 07/21 0701 - 07/22 0700 In: 480 [P.O.:480] Out: 2 [Urine:1; Stool:1] Intake/Output this shift: Total I/O In: 240 [P.O.:240] Out: -   General appearance: alert, cooperative, appears stated age and no distress Head: Normocephalic, without obvious abnormality, atraumatic Resp: clear to auscultation bilaterally Cardio: irregularly irregular, tachycardic. No murmur, click, rub or gallop GI: soft, non-tender; bowel sounds normal; no masses,  no organomegaly. Incision site appears to be clean Extremities: extremities normal, atraumatic, no cyanosis. Pitting edema is noted. Skin: Skin color, texture, turgor normal. No rashes or lesions Neurologic: Alert and oriented. No focal deficits.  Lab Results:  Basename 03/13/12 0500 03/12/12 0545  WBC 8.3 7.0  HGB 9.1* 8.5*  HCT 28.1* 25.8*  PLT 236 204   BMET  Basename 03/13/12 0500  NA 140  K 3.2*  CL 102  CO2 28  GLUCOSE 88  BUN 5*  CREATININE 0.59  CALCIUM 8.0*  ALT --    Studies/Results: No results found.  Medications:  Scheduled:    . diltiazem  120 mg Oral Once  . diltiazem  240 mg Oral Daily  . feeding supplement  237 mL Oral TID BM  . furosemide  40 mg Intravenous Once  . metoprolol tartrate  50 mg Oral BID  . multivitamin  1 tablet Oral Daily  . multivitamin with minerals  1 tablet Oral Daily  . pantoprazole  40 mg Oral QAC breakfast  . potassium chloride  40 mEq Oral Once  . warfarin  2 mg Oral ONCE-1800  . warfarin  2 mg Oral ONCE-1800  . Warfarin - Pharmacist Dosing Inpatient  Does not apply q1800   Continuous:   ZOX:[id number], bisacodyl, dextromethorphan, HYDROmorphone (DILAUDID) injection, metoprolol, ondansetron, phenol, sodium chloride  Assessment/Plan:  Principal Problem:  *Superior mesenteric artery thrombosis Active Problems:  HYPERLIPIDEMIA  ATRIAL FIBRILLATION, CHRONIC  ANGIODYSPLASIA, COLON   Retroperitoneal hematoma  Anemia due to blood loss, acute  Atrial fibrillation with RVR  Malnutrition of moderate degree  Pedal edema    Acute Superior mesenteric artery thrombosis  Felt to likely represent embolic phenomenon related to atrial fibrillation in that there were no preceding symptoms consistent with gut claudication - now status post embolectomy of the superior mesenteric artery with bovine pericardial patch angioplasty of superior mesenteric artery. Anticoagulation discussed with Dr. Shirlee Latch (on 7/21) with [facility] and he recommended Warfarin instead of alternative agents due to ease of reversal. Will aim for INR between 2 and 2.5.  Acute blood loss anemia with intra-abdominal hematoma  Since Hgb continued to drift downward, CTabdom was done - this revealed findings worrisome for intr-abdom bleeding - the pt was taken to the OR on 03/07/12 for an explor lap wch revealed an intra-abdom hematoma w/o evidence of active bleeding. Hgb is stable.  ATRIAL FIBRILLATION, CHRONIC with RVR HR is better controlled but increases with exertion. Had discussed with Dr, Shirlee Latch on 7/21 and he recommended increasing dose of her BB and CCB. Patient remains asymptomatic. A transthoracic echo reveals no evidence of an intracardiac thrombus. Continue to monitor and adjust meds as needed.  Pedal edema She has been in positive fluid balance during this hospitalization. Will giver her Lasix IV for now.  Hypokalemia Replete.  HYPERLIPIDEMIA  Resume treatment when able to take oral meds/food consistently   Malnutrition Ensure. Encourage PO intake.  Possible right lung nodule  This can be pursued as an OP by her PCP   ANGIODYSPLASIA, COLON with prior history of GI bleeding  Patient was previously taken off of Coumadin due to known AVMs (pt now states she never actually had a GIB, but was simply found to have AVMs)   DVT Prophylaxis SCD's  Code Status: Full  Family Communication: communicated  with the patient and family at the bedside  Disposition Plan: Per PT/OT. Will need to watch for bleeding as her INR becomes therapeutic. She may require SNF.     LOS: 11 days   [facility]  [facility] Pager [phone] 03/13/2012, 11:28 AM

## 2012-03-13 NOTE — Progress Notes (Signed)
Physical Therapy Treatment Patient Details Name: Lisa Douglas MRN: [id number] DOB: [birth year] Today's Date: 03/13/2012 Time: 4132-4401 PT Time Calculation (min): 22 min  PT Assessment / Plan / Recommendation Comments on Treatment Session  Pt continues to improve with mobility.      Follow Up Recommendations  Home health PT;Supervision/Assistance - 24 hour    Barriers to Discharge        Equipment Recommendations  None recommended by PT    Recommendations for Other Services    Frequency Min 3X/week   Plan Discharge plan remains appropriate    Precautions / Restrictions Precautions Precautions: Fall Restrictions Weight Bearing Restrictions: No   Pertinent Vitals/Pain No c/o pain     Mobility  Bed Mobility Bed Mobility: Supine to Sit Supine to Sit: 5: Supervision Details for Bed Mobility Assistance: supervision for safety Transfers Transfers: Sit to Stand;Stand to Sit Sit to Stand: 5: Supervision Details for Transfer Assistance: supervision for safety.  Ambulation/Gait Ambulation/Gait Assistance: 5: Supervision Ambulation Distance (Feet): 200 Feet Assistive device: Rolling walker Ambulation/Gait Assistance Details: Cueing for upright posture and decreased distance from walker.   Gait Pattern: Step-through pattern;Decreased step length - right;Decreased step length - left;Decreased stride length;Trunk flexed Gait velocity: Slow Stairs: Yes Stairs Assistance: 4: Min assist Stairs Assistance Details (indicate cue type and reason): HHA for balance and increase use of L UE secondary to LE weakness.  Stair Management Technique: One rail Right;Step to pattern;Forwards Number of Stairs: 5  Wheelchair Mobility Wheelchair Mobility: No    Exercises Total Joint Exercises Ankle Circles/Pumps: Both;10 reps;Seated Long Arc Quad: 10 reps;Both;Seated   PT Diagnosis:    PT Problem List:   PT Treatment Interventions:     PT Goals Acute Rehab PT Goals PT Goal Formulation:  With patient Time For Goal Achievement: 03/11/12 Potential to Achieve Goals: Good Pt will go Supine/Side to Sit: with modified independence PT Goal: Supine/Side to Sit - Progress: Progressing toward goal Pt will go Sit to Supine/Side: with modified independence PT Goal: Sit to Supine/Side - Progress: Progressing toward goal Pt will go Sit to Stand: with supervision PT Goal: Sit to Stand - Progress: Met Pt will go Stand to Sit: with supervision PT Goal: Stand to Sit - Progress: Met Pt will Transfer Bed to Chair/Chair to Bed: with supervision PT Transfer Goal: Bed to Chair/Chair to Bed - Progress: Met Pt will Ambulate: 51 - 150 feet;with supervision;with least restrictive assistive device PT Goal: Ambulate - Progress: Met Pt will Go Up / Down Stairs: 3-5 stairs;with supervision;with rail(s) PT Goal: Up/Down Stairs - Progress: Progressing toward goal  Visit Information  Last PT Received On: 03/13/12 Assistance Needed: +1    Subjective Data  Subjective: I feel alright    Cognition  Overall Cognitive Status: Appears within functional limits for tasks assessed/performed Arousal/Alertness: Awake/alert Orientation Level: Appears intact for tasks assessed Behavior During Session: [facility] for tasks performed    Balance  Balance Balance Assessed: No  End of Session PT - End of Session Equipment Utilized During Treatment: Gait belt Activity Tolerance: Patient tolerated treatment well Patient left: in chair;with call bell/phone within reach Nurse Communication: Mobility status   GP     Lisa Douglas 03/13/2012, 2:13 PM [name] L. Jonesha Tsuchiya DPT [phone]

## 2012-03-14 LAB — BASIC METABOLIC PANEL
BUN: 7 mg/dL (ref 6–23)
Calcium: 8.5 mg/dL (ref 8.4–10.5)
Creatinine, Ser: 0.6 mg/dL (ref 0.50–1.10)
GFR calc non Af Amer: 83 mL/min — ABNORMAL LOW (ref >90)
Glucose, Bld: 95 mg/dL (ref 70–99)
Sodium: 140 mEq/L (ref 135–145)

## 2012-03-14 LAB — CBC
MCH: 30.9 pg (ref 26.0–34.0)
MCHC: 32.7 g/dL (ref 30.0–36.0)
Platelets: 261 10*3/uL (ref 150–400)
RBC: 3.14 MIL/uL — ABNORMAL LOW (ref 3.87–5.11)
RDW: 16.6 % — ABNORMAL HIGH (ref 11.5–15.5)

## 2012-03-14 MED ORDER — FUROSEMIDE 10 MG/ML IJ SOLN
40.0000 mg | Freq: Two times a day (BID) | INTRAMUSCULAR | Status: AC
  Administered 2012-03-15: 40 mg via INTRAVENOUS
  Filled 2012-03-14: qty 4

## 2012-03-14 MED ORDER — FUROSEMIDE 10 MG/ML IJ SOLN
40.0000 mg | Freq: Two times a day (BID) | INTRAMUSCULAR | Status: DC
  Administered 2012-03-14: 40 mg via INTRAVENOUS
  Filled 2012-03-14 (×2): qty 4

## 2012-03-14 MED ORDER — FUROSEMIDE 40 MG PO TABS
40.0000 mg | ORAL_TABLET | Freq: Every day | ORAL | Status: DC
  Administered 2012-03-15 – 2012-03-17 (×3): 40 mg via ORAL
  Filled 2012-03-14 (×3): qty 1

## 2012-03-14 MED ORDER — WARFARIN SODIUM 4 MG PO TABS
4.0000 mg | ORAL_TABLET | Freq: Once | ORAL | Status: AC
  Administered 2012-03-14: 4 mg via ORAL
  Filled 2012-03-14: qty 1

## 2012-03-14 MED ORDER — POTASSIUM CHLORIDE CRYS ER 20 MEQ PO TBCR
20.0000 meq | EXTENDED_RELEASE_TABLET | Freq: Every day | ORAL | Status: DC
  Administered 2012-03-15 – 2012-03-17 (×3): 20 meq via ORAL
  Filled 2012-03-14: qty 2
  Filled 2012-03-14 (×3): qty 1

## 2012-03-14 MED ORDER — POTASSIUM CHLORIDE CRYS ER 20 MEQ PO TBCR
40.0000 meq | EXTENDED_RELEASE_TABLET | Freq: Once | ORAL | Status: AC
  Administered 2012-03-14: 40 meq via ORAL

## 2012-03-14 MED FILL — Warfarin Sodium Tab 2 MG: ORAL | Qty: 1 | Status: AC

## 2012-03-14 MED FILL — Metoprolol Tartrate Tab 50 MG: ORAL | Qty: 1 | Status: AC

## 2012-03-14 NOTE — Progress Notes (Signed)
Physical Therapy Treatment Patient Details Name: Lisa Douglas MRN: [id number] DOB: 05/16/1928 Today's Date: 03/14/2012 Time: 4098-1191 PT Time Calculation (min): 13 min  PT Assessment / Plan / Recommendation Comments on Treatment Session  Pt adm with superior mesenteric artery thrombosis and underwent thrombectomy.  Pt limited by general fatigue.    Follow Up Recommendations  Home health PT;Supervision - Intermittent    Barriers to Discharge        Equipment Recommendations  None recommended by PT    Recommendations for Other Services    Frequency Min 3X/week   Plan Discharge plan remains appropriate;Frequency remains appropriate    Precautions / Restrictions Precautions Precautions: Fall Restrictions Weight Bearing Restrictions: No   Pertinent Vitals/Pain N/A    Mobility  Bed Mobility Sit to Supine: HOB flat;4: Min assist Details for Bed Mobility Assistance: assist to bring foot up Transfers Sit to Stand: 5: Supervision;With upper extremity assist;From toilet;From chair/3-in-1 Stand to Sit: 5: Supervision;To bed;To toilet Ambulation/Gait Ambulation/Gait Assistance: 5: Supervision Ambulation Distance (Feet): 125 Feet Assistive device: Rolling walker Ambulation/Gait Assistance Details: verbal cues to stay closer to walker Gait Pattern: Step-through pattern;Decreased stride length;Trunk flexed    Exercises     PT Diagnosis:    PT Problem List:   PT Treatment Interventions:     PT Goals Acute Rehab PT Goals PT Goal: Sit to Supine/Side - Progress: Progressing toward goal Pt will go Sit to Stand: with modified independence PT Goal: Sit to Stand - Progress: Updated due to goal met Pt will go Stand to Sit: with modified independence PT Goal: Stand to Sit - Progress: Updated due to goals met Pt will Ambulate: 51 - 150 feet;with modified independence PT Goal: Ambulate - Progress: Updated due to goal met  Visit Information  Last PT Received On: 03/14/12 Assistance  Needed: +1    Subjective Data  Subjective: "That took something out of me."   Cognition  Overall Cognitive Status: Appears within functional limits for tasks assessed/performed Arousal/Alertness: Awake/alert Orientation Level: Appears intact for tasks assessed Behavior During Session: [facility] for tasks performed    Balance  Static Standing Balance Static Standing - Balance Support: During functional activity Static Standing - Level of Assistance: 5: Stand by assistance  End of Session PT - End of Session Activity Tolerance: Patient tolerated treatment well Patient left: in bed;with call bell/phone within reach Nurse Communication: Mobility status   GP     Dasean Brow 03/14/2012, 1:15 PM  [employer] PT [phone]

## 2012-03-14 NOTE — Progress Notes (Signed)
Occupational Therapy Treatment Patient Details Name: Lisa Douglas MRN: [id number] DOB: Sep 21, 1927 Today's Date: 03/14/2012 Time: 7829-5621 OT Time Calculation (min): 22 min  OT Assessment / Plan / Recommendation Comments on Treatment Session needed max encouragement to participate    Follow Up Recommendations  Home health OT;Supervision/Assistance - 24 hour             Frequency Min 2X/week   Plan Discharge plan remains appropriate           ADL  Grooming: Performed;Wash/dry hands;Wash/dry face Where Assessed - Grooming: Unsupported standing;Other (comment) (standing at sink) Lower Body Dressing: Performed;Minimal assistance Where Assessed - Lower Body Dressing: Supported sit to stand Toilet Transfer: Performed;Min Pension scheme manager Method: Sit to stand;Other (comment) (walk to bathroom with walker) Toilet Transfer Equipment: Regular height toilet;Other (comment) (has elevated seat for home) Toileting - Clothing Manipulation and Hygiene: Performed;Min guard Where Assessed - Toileting Clothing Manipulation and Hygiene: Standing ADL Comments: Pt needed max encouragement.  Pt stated she juist wasnt very motivated, but stated she was pleased she did get up and participate with OT      OT Goals ADL Goals ADL Goal: Grooming - Progress: Progressing toward goals ADL Goal: Lower Body Bathing - Progress: Progressing toward goals ADL Goal: Lower Body Dressing - Progress: Progressing toward goals ADL Goal: Toilet Transfer - Progress: Progressing toward goals ADL Goal: Toileting - Clothing Manipulation - Progress: Progressing toward goals ADL Goal: Toileting - Hygiene - Progress: Progressing toward goals  Visit Information  Last OT Received On: 03/14/12          Cognition  Overall Cognitive Status: Appears within functional limits for tasks assessed/performed Arousal/Alertness: Awake/alert Orientation Level: Appears intact for tasks assessed Behavior During Session: [facility]  for tasks performed    Mobility Transfers Transfers: Sit to Stand;Stand to Sit Sit to Stand: 4: Min assist;From toilet;From chair/3-in-1 Stand to Sit: To chair/3-in-1;4: Min assist         End of Session OT - End of Session Activity Tolerance: Patient limited by fatigue Patient left: in chair;with call bell/phone within reach       [facility], Karin Golden D 03/14/2012, 12:14 PM

## 2012-03-14 NOTE — Progress Notes (Addendum)
[facility] Progress Note  03/14/2012 7:34 AM POD 11/7  Subjective:  No complaints  Afebrile x 24 hrs VSS Filed Vitals:   03/14/12 0415  BP: 122/70  Pulse: 82  Temp: 97.6 F (36.4 C)  Resp: 19     Physical Exam: Cardiac:  irreg Lungs:  Non labored Abdomen:  Soft NT/ND +BS denies N/V Incisions:  C/d/i   CBC    Component Value Date/Time   WBC 8.7 03/14/2012 0600   RBC 3.14* 03/14/2012 0600   HGB 9.7* 03/14/2012 0600   HCT 29.7* 03/14/2012 0600   PLT 261 03/14/2012 0600   MCV 94.6 03/14/2012 0600   MCH 30.9 03/14/2012 0600   MCHC 32.7 03/14/2012 0600   RDW 16.6* 03/14/2012 0600   LYMPHSABS 1.1 03/02/2012 2340   MONOABS 0.6 03/02/2012 2340   EOSABS 0.0 03/02/2012 2340   BASOSABS 0.0 03/02/2012 2340    BMET    Component Value Date/Time   NA 140 03/14/2012 0600   K 3.9 03/14/2012 0600   CL 102 03/14/2012 0600   CO2 31 03/14/2012 0600   GLUCOSE 95 03/14/2012 0600   BUN 7 03/14/2012 0600   CREATININE 0.60 03/14/2012 0600   CALCIUM 8.5 03/14/2012 0600   GFRNONAA 83* 03/14/2012 0600   GFRAA >90 03/14/2012 0600    INR    Component Value Date/Time   INR 1.16 03/14/2012 0600     Intake/Output Summary (Last 24 hours) at 03/14/12 0734 Last data filed at 03/14/12 0416  Gross per 24 hour  Intake    240 ml  Output   1000 ml  Net   -760 ml     Assessment/Plan:  78 y.o. female is s/p SMA embolectomy and re-exploration  POD 11/7  -pt doing well from surgical standpoint -continue coumadin per primary team.  Doreatha Massed, PA-C [facility] [phone] 03/14/2012 7:34 AM  Agree with above. INR = 1.16; Hgb = 9.7  Jayel Inks S. Edilia Bo, MD, FACS Beeper [phone] 03/14/2012 '

## 2012-03-14 NOTE — Progress Notes (Signed)
PCP: Michele Mcalpine, MD  Brief HPI:  78 y.o. female with history of chronic atrial fibrillation (not on full anticoagulation due to AVM), anxiety, GERD, and hyperlipidemia, who presented to the emergency room complaining of the acute onset of abdominal pain for 2 days, accompanied with nausea and vomiting along with watery diarrhea. She denied any recent antibiotic. She denied black stool or bloody stool. She denied any fever or chills. Evaluation in emergency room included an abdominal pelvic CT which revealed a superior mesenteric artery non-occlusive thrombus, but no evidence of ischemic bowel (along with incidental finding of a small pulmonary nodule and calcification in the aorta).  Past medical history:  Past Medical History  Diagnosis Date  . Macular degeneration (senile) of retina, unspecified   . Atrial fibrillation   . Unspecified venous (peripheral) insufficiency   . Other and unspecified hyperlipidemia   . Esophageal reflux   . Diverticulosis of colon (without mention of hemorrhage)   . Angiodysplasia of intestine (without mention of hemorrhage)   . Osteoarthrosis, unspecified whether generalized or localized, unspecified site   . Osteoporosis, unspecified   . Anxiety state, unspecified     Consultants:  Vascular surgery  [facility] - telephone consultation [facility]: Over the phone  Procedures:  03/03/2012 - embolectomy of the superior mesenteric artery with bovine pericardial patch angioplasty of superior mesenteric artery  03/07/2012 - exploratory laparotomy with evacuation of intra-abdom hematoma   Subjective: Patient feels well. Denies any pain. No Cp or SOB. Ambulated with a walker and did well.  Objective: Vital signs in last 24 hours: Temp:  [97.6 F (36.4 C)-97.8 F (36.6 C)] 97.6 F (36.4 C) (07/23 0415) Pulse Rate:  [82-92] 82  (07/23 0415) Resp:  [16-19] 19  (07/23 0415) BP: (107-122)/(56-73) 122/70 mmHg (07/23 0415) SpO2:  [90 %-94 %] 91 % (07/23  0415) Weight change:  Last BM Date: 03/13/12  Intake/Output from previous day: 07/22 0701 - 07/23 0700 In: 240 [P.O.:240] Out: 1000 [Urine:1000] Intake/Output this shift: Total I/O In: -  Out: 1501 [Urine:1500; Stool:1]  General appearance: alert, cooperative, appears stated age and no distress Resp: clear to auscultation bilaterally Cardio: irregularly irregular, normal rate. No murmur, click, rub or gallop GI: soft, non-tender; bowel sounds normal; no masses,  no organomegaly. Incision site appears to be clean Extremities: extremities normal, atraumatic, no cyanosis. Pitting edema is noted. Skin: Skin color, texture, turgor normal. No rashes or lesions Neurologic: Alert and oriented. No focal deficits.  Lab Results:  Basename 03/14/12 0600 03/13/12 0500  WBC 8.7 8.3  HGB 9.7* 9.1*  HCT 29.7* 28.1*  PLT 261 236   BMET  Basename 03/14/12 0600 03/13/12 0500  NA 140 140  K 3.9 3.2*  CL 102 102  CO2 31 28  GLUCOSE 95 88  BUN 7 5*  CREATININE 0.60 0.59  CALCIUM 8.5 8.0*  ALT -- --    Studies/Results: No results found.  Medications:  Scheduled:    . diltiazem  240 mg Oral Daily  . feeding supplement  237 mL Oral TID BM  . furosemide  40 mg Intravenous Once  . furosemide  40 mg Intravenous Q12H  . metoprolol tartrate  75 mg Oral BID  . multivitamin  1 tablet Oral Daily  . multivitamin with minerals  1 tablet Oral Daily  . pantoprazole  40 mg Oral QAC breakfast  . potassium chloride  40 mEq Oral Q4H  . warfarin  2 mg Oral ONCE-1800  . warfarin  2 mg  Oral ONCE-1800  . warfarin  4 mg Oral ONCE-1800  . Warfarin - Pharmacist Dosing Inpatient   Does not apply q1800  . DISCONTD: furosemide  40 mg Intravenous Q12H   Continuous:   ZOX:[id number], bisacodyl, dextromethorphan, HYDROmorphone (DILAUDID) injection, metoprolol, ondansetron, phenol, sodium chloride  Assessment/Plan:  Principal Problem:  *Superior mesenteric artery thrombosis Active Problems:   HYPERLIPIDEMIA  ATRIAL FIBRILLATION, CHRONIC  ANGIODYSPLASIA, COLON  Retroperitoneal hematoma  Anemia due to blood loss, acute  Atrial fibrillation with RVR  Malnutrition of moderate degree  Pedal edema    Acute Superior mesenteric artery thrombosis  Felt to likely represent embolic phenomenon related to atrial fibrillation in that there were no preceding symptoms consistent with gut claudication - now status post embolectomy of the superior mesenteric artery with bovine pericardial patch angioplasty of superior mesenteric artery. Anticoagulation discussed with Dr. Shirlee Latch (on 7/21) with [facility] and he recommended Warfarin instead of alternative agents due to ease of reversal. Will aim for INR between 2 and 2.5.  Acute blood loss anemia with intra-abdominal hematoma  Since Hgb continued to drift downward, CTabdom was done - this revealed findings worrisome for intr-abdom bleeding - the pt was taken to the OR on 03/07/12 for an explor lap wch revealed an intra-abdom hematoma w/o evidence of active bleeding. Monitor CBC closely while she is being anticoagulated  ATRIAL FIBRILLATION, CHRONIC with RVR HR is better controlled. Had discussed with Dr, Shirlee Latch on 7/21 and he recommended increasing dose of her BB and CCB. Patient remains asymptomatic. A transthoracic echo reveals no evidence of an intracardiac thrombus. Continue to monitor and adjust meds as needed.  Pedal edema She has been in positive fluid balance during this hospitalization. Will giver her Lasix IV for now. Change to oral Lasix from 7/24. Add KCL as well.  Hypokalemia Repleted.  HYPERLIPIDEMIA  Resume treatment when able to take oral meds/food consistently   Malnutrition Ensure. Encourage PO intake.  Possible right lung nodule  This can be pursued as an OP by her PCP   ANGIODYSPLASIA, COLON with prior history of GI bleeding  Patient was previously taken off of Coumadin due to known AVMs (pt now states she never actually  had a GIB, but was simply found to have AVMs)   DVT Prophylaxis SCD's  Code Status: Full   Disposition Plan: Will need HH which has been ordered. Will need to watch for bleeding as her INR becomes therapeutic. Anticipate discharge in 2-3 days once INR is therapeutic and if no evidence for rebleeding. She will need INR and CBC checks at home.     LOS: 12 days   [facility]  [facility] Pager [phone] 03/14/2012, 1:34 PM

## 2012-03-14 NOTE — Progress Notes (Signed)
Thank you to Dr. Jacky Kindle for this referral via the length of stay meeting.  Chart review complete.  Provided service overview at bedside to patient.  She was very accepting to the information, but declines to participate at this time.  She indicated that she has no difficulty procuring medications.  She is accustomed to driving to her PCP appointments and the like and has family available to assist until she feels confident enough to drive postdischarge.  Contact information provided.  For any additional questions or new referrals please contact Anibal Henderson BSN RN [facility] Liaison at [phone].

## 2012-03-14 NOTE — Progress Notes (Signed)
ANTICOAGULATION CONSULT NOTE - Follow Up Consult  Pharmacy Consult for Coumadin Indication: atrial fibrillation and SMA embolus s/p embolectomy  Allergies  Allergen Reactions  . Penicillins     angioedema  . Alendronate Sodium     REACTION: INTOL to Fosamax per pt.  . Atorvastatin     REACTION: INTOL to Lipitor per pt.   Labs:  [facility] 03/14/12 0600 03/13/12 0500 03/12/12 0545  HGB 9.7* 9.1* --  HCT 29.7* 28.1* 25.8*  PLT 261 236 204  APTT -- -- --  LABPROT 15.0 14.7 --  INR 1.16 1.13 --  HEPARINUNFRC -- -- --  CREATININE 0.60 0.59 --  CKTOTAL -- -- --  CKMB -- -- --  TROPONINI -- -- --    Estimated Creatinine Clearance: 57.5 ml/min (by C-G formula based on Cr of 0.6).   Assessment: Lisa Douglas is known to pharmacy from prior management of heparin/Coumadin. She is POD10 from embolectomy of SMA with patch angioplasty and POD8 from exp lap evacuation of intraabdominal hematoma. She has a hx of Afib, not previously on chronic anticoag d/t hx of AVMs. Now to begin Coumadin d/t concern for afib related SMA embolus.  INR=1.18 today  Goal of Therapy:  INR 2-2.5 Monitor platelets by anticoagulation protocol: Yes   Plan:  - Coumadin 4 mg po x 1 today - Will f/up daily INR  Okey Regal, PharmD [phone]  -03/14/2012 9:31 AM

## 2012-03-15 LAB — CBC
MCH: 30.8 pg (ref 26.0–34.0)
MCHC: 32.6 g/dL (ref 30.0–36.0)
MCV: 94.6 fL (ref 78.0–100.0)
Platelets: 289 10*3/uL (ref 150–400)
RDW: 16.4 % — ABNORMAL HIGH (ref 11.5–15.5)

## 2012-03-15 LAB — PROTIME-INR: Prothrombin Time: 15.4 seconds — ABNORMAL HIGH (ref 11.6–15.2)

## 2012-03-15 MED ORDER — WARFARIN SODIUM 6 MG PO TABS
6.0000 mg | ORAL_TABLET | Freq: Once | ORAL | Status: AC
  Administered 2012-03-15: 6 mg via ORAL
  Filled 2012-03-15: qty 1

## 2012-03-15 NOTE — Care Management Note (Unsigned)
    Page 1 of 1   03/15/2012     9:21:15 AM   CARE MANAGEMENT NOTE 03/15/2012  Patient:  Lisa Douglas, Lisa Douglas   Account Number:  [id number]  Date Initiated:  03/10/2012  Documentation initiated by:  Donn Pierini  Subjective/Objective Assessment:   Pt admitted with superior mesenteric artery thrombosis, now also s/p exp lab for post op bleeding     Action/Plan:   PTA pt lived at home, PT/OT evals   Anticipated DC Date:  03/16/2012   Anticipated DC Plan:  HOME W HOME HEALTH SERVICES      DC Planning Services  CM consult      Choice offered to / List presented to:  C-1 Patient           Status of service:  In process, will continue to follow Medicare Important Message given?   (If response is "NO", the following Medicare IM given date fields will be blank) Date Medicare IM given:   Date Additional Medicare IM given:    Discharge Disposition:    Per UR Regulation:  Reviewed for med. necessity/level of care/duration of stay  If discussed at Long Length of Stay Meetings, dates discussed:   03/14/2012    Comments:  03/15/12  0900  Aldon Hengst SIMMONS RN, BSN [phone] DISCUSSED DISCHARGE PLANNING AT BEDSIDE; PT REFUSES HH SERVICES, STATES HER NEICES ARE "GOING TO HELP TAKE CARE OF ME AT HOME, 2 ARE CNA'S AND 1 IS AN RN";  NCM INFORMED HER TO LET ME KNOW IF SHE CHANGES HER MIND.      PCP- [name]

## 2012-03-15 NOTE — Progress Notes (Signed)
Physical Therapy Treatment Patient Details Name: Lisa Douglas MRN: [id number] DOB: Feb 19, 1928 Today's Date: 03/15/2012 Time: 4098-1191 PT Time Calculation (min): 17 min  PT Assessment / Plan / Recommendation Comments on Treatment Session  Spoke with pt at length about activity modification to prevent another A-Fib episode.  Pt not interested in HHPT.  Attempted to educate pt on importance of having her vitals signs monitored while performing threrapy to avoid another A-FIB episode.      Follow Up Recommendations  Home health PT;Supervision - Intermittent    Barriers to Discharge        Equipment Recommendations  None recommended by PT    Recommendations for Other Services    Frequency Min 3X/week   Plan Discharge plan remains appropriate;Frequency remains appropriate    Precautions / Restrictions Precautions Precautions: Fall Restrictions Weight Bearing Restrictions: No   Pertinent Vitals/Pain No c/o pain.     Mobility  Bed Mobility Bed Mobility: Not assessed Transfers Transfers: Sit to Stand;Stand to Sit Sit to Stand: 6: Modified independent (Device/Increase time) Stand to Sit: 5: Supervision;To chair/3-in-1 Details for Transfer Assistance: Pt continues to require repeated cueing for hand placement and controlled descent to chair. Pt transferred 3 trials and required cueing each trial  Ambulation/Gait Ambulation/Gait Assistance: 5: Supervision Ambulation Distance (Feet): 250 Feet Assistive device: Rolling walker Ambulation/Gait Assistance Details: Pt HR increased from low 90s to 136 while ambulating.   Gait Pattern: Step-through pattern;Decreased stride length;Trunk flexed Stairs: No    Exercises     PT Diagnosis:    PT Problem List:   PT Treatment Interventions:     PT Goals Acute Rehab PT Goals PT Goal Formulation: With patient Time For Goal Achievement: 03/11/12 Potential to Achieve Goals: Good Pt will go Supine/Side to Sit: with modified independence PT  Goal: Supine/Side to Sit - Progress: Goal set today Pt will go Sit to Supine/Side: with modified independence PT Goal: Sit to Supine/Side - Progress: Progressing toward goal Pt will go Sit to Stand: with modified independence PT Goal: Sit to Stand - Progress: Met Pt will go Stand to Sit: with modified independence PT Goal: Stand to Sit - Progress: Progressing toward goal Pt will Transfer Bed to Chair/Chair to Bed: with supervision PT Transfer Goal: Bed to Chair/Chair to Bed - Progress: Met Pt will Ambulate: 51 - 150 feet;with modified independence;with least restrictive assistive device PT Goal: Ambulate - Progress: Met Pt will Go Up / Down Stairs: 3-5 stairs;with supervision;with rail(s) PT Goal: Up/Down Stairs - Progress: Progressing toward goal  Visit Information  Last PT Received On: 03/15/12 Assistance Needed: +1    Subjective Data  Subjective: I don't feel my heart racing when I am in A-fib Patient Stated Goal: return to home independent   Cognition  Overall Cognitive Status: Appears within functional limits for tasks assessed/performed Arousal/Alertness: Awake/alert Orientation Level: Appears intact for tasks assessed Behavior During Session: [facility] for tasks performed    Balance  Balance Balance Assessed: No  End of Session PT - End of Session Equipment Utilized During Treatment: Gait belt Activity Tolerance: Patient tolerated treatment well (HR increased during session. ) Patient left: in chair;with call bell/phone within reach Nurse Communication: Mobility status   GP     Jasmond River 03/15/2012, 4:38 PM Mckaylee Dimalanta L. Josilyn Shippee DPT [phone]

## 2012-03-15 NOTE — Progress Notes (Signed)
PCP: Michele Mcalpine, MD  Brief HPI:  78 y.o. female with history of chronic atrial fibrillation (not on full anticoagulation due to AVM), anxiety, GERD, and hyperlipidemia, who presented to the emergency room complaining of the acute onset of abdominal pain for 2 days, accompanied with nausea and vomiting along with watery diarrhea. She denied any recent antibiotic. She denied black stool or bloody stool. She denied any fever or chills. Evaluation in emergency room included an abdominal pelvic CT which revealed a superior mesenteric artery non-occlusive thrombus, but no evidence of ischemic bowel (along with incidental finding of a small pulmonary nodule and calcification in the aorta),S/P embolectomy, had  Retroperitoneal bleed on anti coag, now resumed on warfarin.  Past medical history:  Past Medical History  Diagnosis Date  . Macular degeneration (senile) of retina, unspecified   . Atrial fibrillation   . Unspecified venous (peripheral) insufficiency   . Other and unspecified hyperlipidemia   . Esophageal reflux   . Diverticulosis of colon (without mention of hemorrhage)   . Angiodysplasia of intestine (without mention of hemorrhage)   . Osteoarthrosis, unspecified whether generalized or localized, unspecified site   . Osteoporosis, unspecified   . Anxiety state, unspecified     Consultants:  Vascular surgery  [facility] - telephone consultation [facility]: Over the phone  Procedures:  03/03/2012 - embolectomy of the superior mesenteric artery with bovine pericardial patch angioplasty of superior mesenteric artery  03/07/2012 - exploratory laparotomy with evacuation of intra-abdom hematoma   Subjective: Patient feels well. Denies any pain. No Cp or SOB. Ambulated with a walker and did well.  Objective: Vital signs in last 24 hours: Temp:  [96.8 F (36 C)-98 F (36.7 C)] 97 F (36.1 C) (07/24 1341) Pulse Rate:  [82-87] 82  (07/24 1341) Resp:  [12-18] 12  (07/24 1341) BP:  (109-114)/(58-64) 109/61 mmHg (07/24 1341) SpO2:  [90 %-95 %] 93 % (07/24 1341) Weight:  [72.1 kg (158 lb 15.2 oz)] 72.1 kg (158 lb 15.2 oz) (07/24 0522) Weight change:  Last BM Date: 03/14/12  Intake/Output from previous day: 07/23 0701 - 07/24 0700 In: 720 [P.O.:720] Out: 2402 [Urine:2400; Stool:2] Intake/Output this shift: Total I/O In: -  Out: 1 [Stool:1]  General appearance: alert, cooperative, appears stated age and no distress Resp: clear to auscultation bilaterally Cardio: irregularly irregular, normal rate. No murmur, click, rub or gallop GI: soft, non-tender; bowel sounds normal; no masses,  no organomegaly. Incision site appears to be clean Extremities: extremities normal, atraumatic, no cyanosis. Pitting edema is noted. Skin: Skin color, texture, turgor normal. No rashes or lesions Neurologic: Alert and oriented. No focal deficits.  Lab Results:  Basename 03/15/12 0640 03/14/12 0600  WBC 8.7 8.7  HGB 10.3* 9.7*  HCT 31.6* 29.7*  PLT 289 261   BMET  Basename 03/14/12 0600 03/13/12 0500  NA 140 140  K 3.9 3.2*  CL 102 102  CO2 31 28  GLUCOSE 95 88  BUN 7 5*  CREATININE 0.60 0.59  CALCIUM 8.5 8.0*  ALT -- --    Studies/Results: No results found.  Medications:  Scheduled:    . diltiazem  240 mg Oral Daily  . feeding supplement  237 mL Oral TID BM  . furosemide  40 mg Intravenous Q12H  . furosemide  40 mg Oral Daily  . metoprolol tartrate  75 mg Oral BID  . multivitamin  1 tablet Oral Daily  . multivitamin with minerals  1 tablet Oral Daily  . pantoprazole  40 mg Oral QAC breakfast  . potassium chloride  20 mEq Oral Daily  . warfarin  4 mg Oral ONCE-1800  . warfarin  6 mg Oral ONCE-1800  . Warfarin - Pharmacist Dosing Inpatient   Does not apply q1800  . DISCONTD: furosemide  40 mg Intravenous Q12H   Continuous:   ZOX:[id number], bisacodyl, dextromethorphan, HYDROmorphone (DILAUDID) injection, metoprolol, ondansetron, phenol, sodium  chloride  Assessment/Plan:  Principal Problem:  *Superior mesenteric artery thrombosis Active Problems:  HYPERLIPIDEMIA  ATRIAL FIBRILLATION, CHRONIC  ANGIODYSPLASIA, COLON  Retroperitoneal hematoma  Anemia due to blood loss, acute  Atrial fibrillation with RVR  Malnutrition of moderate degree  Pedal edema    Acute Superior mesenteric artery thrombosis  Felt to likely represent embolic phenomenon related to atrial fibrillation in that there were no preceding symptoms consistent with gut claudication - now status post embolectomy of the superior mesenteric artery with bovine pericardial patch angioplasty of superior mesenteric artery. Anticoagulation discussed with Dr. Shirlee Latch (on 7/21) with [facility] and he recommended Warfarin instead of alternative agents due to ease of reversal. Will aim for INR between 2 and 2.5.  Acute blood loss anemia with intra-abdominal hematoma  Since Hgb continued to drift downward, CTabdom was done - this revealed findings worrisome for intr-abdom bleeding - the pt was taken to the OR on 03/07/12 for an explor lap wch revealed an intra-abdom hematoma w/o evidence of active bleeding. Monitor CBC closely while she is being anticoagulated  ATRIAL FIBRILLATION, CHRONIC with RVR HR is better controlled. Had discussed with Dr, Shirlee Latch on 7/21 and he recommended increasing dose of her BB and CCB. Patient remains asymptomatic. A transthoracic echo reveals no evidence of an intracardiac thrombus. Continue to monitor and adjust meds as needed.  Pedal edema She has been in positive fluid balance during this hospitalization. Will giver her Lasix IV for now. Change to oral Lasix from 7/24. Add KCL as well.   HYPERLIPIDEMIA  Resume treatment when able to take oral meds/food consistently   Malnutrition Ensure. Encourage PO intake.  Possible right lung nodule  This can be pursued as an OP by her PCP   ANGIODYSPLASIA, COLON with prior history of GI bleeding  Patient  was previously taken off of Coumadin due to known AVMs (pt now states she never actually had a GIB, but was simply found to have AVMs)   DVT Prophylaxis SCD's  Code Status: Full   Disposition Plan: Will need HH which has been ordered. Will need to watch for bleeding as her INR becomes therapeutic. Anticipate discharge in 2-3 days once INR is therapeutic and if no evidence for rebleeding. She will need INR and CBC checks at home.     LOS: 13 days   [facility], Amisadai Woodford  [facility] Pager [phone] 03/15/2012, 3:04 PM

## 2012-03-15 NOTE — Progress Notes (Signed)
ANTICOAGULATION CONSULT NOTE - Follow Up Consult  Pharmacy Consult for Coumadin Indication: atrial fibrillation and SMA embolus s/p embolectomy  Allergies  Allergen Reactions  . Penicillins     angioedema  . Alendronate Sodium     REACTION: INTOL to Fosamax per pt.  . Atorvastatin     REACTION: INTOL to Lipitor per pt.   Labs:  [facility] 03/15/12 0640 03/14/12 0600 03/13/12 0500  HGB 10.3* 9.7* --  HCT 31.6* 29.7* 28.1*  PLT 289 261 236  APTT -- -- --  LABPROT 15.4* 15.0 14.7  INR 1.19 1.16 1.13  HEPARINUNFRC -- -- --  CREATININE -- 0.60 0.59  CKTOTAL -- -- --  CKMB -- -- --  TROPONINI -- -- --    Estimated Creatinine Clearance: 53.9 ml/min (by C-G formula based on Cr of 0.6).   Assessment: Ms. Feagan is known to pharmacy from prior management of heparin/Coumadin. She is POD10 from embolectomy of SMA with patch angioplasty and POD8 from exp lap evacuation of intraabdominal hematoma. She has a hx of Afib, not previously on chronic anticoag d/t hx of AVMs. Now to begin Coumadin d/t concern for afib related SMA embolus.  INR=1.19 today  Goal of Therapy:  INR 2-2.5 Monitor platelets by anticoagulation protocol: Yes   Plan:  - Coumadin 6 mg po x 1 today - Will f/up daily INR  Okey Regal, PharmD [phone]  -03/15/2012 9:53 AM

## 2012-03-15 NOTE — Progress Notes (Signed)
VASCULAR PROGRESS NOTE  SUBJECTIVE: Tolerating diet. Bowels functioning.  PHYSICAL EXAM: Filed Vitals:   03/14/12 0415 03/14/12 1403 03/14/12 2040 03/15/12 0522  BP: 122/70 112/72 112/64 114/58  Pulse: 82 93 87 83  Temp: 97.6 F (36.4 C)  96.8 F (36 C) 98 F (36.7 C)  TempSrc: Oral  Oral Axillary  Resp: 19 18 18 18   Height:      Weight:    158 lb 15.2 oz (72.1 kg)  SpO2: 91% 93% 95% 90%   Abd: soft, non-tender. Incision looks fine.   LABS: Lab Results  Component Value Date   WBC 8.7 03/14/2012   HGB 9.7* 03/14/2012   HCT 29.7* 03/14/2012   MCV 94.6 03/14/2012   PLT 261 03/14/2012   INR today = 1.19   ASSESSMENT/PLAN: 1.Doing well S/P SMA embolectomy. 2. I'll arrange F/U once discharged.  Waverly Ferrari, MD, FACS Beeper: [phone] 03/15/2012

## 2012-03-16 LAB — CBC
MCHC: 32.2 g/dL (ref 30.0–36.0)
Platelets: 307 10*3/uL (ref 150–400)
RDW: 16.2 % — ABNORMAL HIGH (ref 11.5–15.5)
WBC: 8.7 10*3/uL (ref 4.0–10.5)

## 2012-03-16 LAB — PROTIME-INR: INR: 1.35 (ref 0.00–1.49)

## 2012-03-16 MED ORDER — WARFARIN SODIUM 6 MG PO TABS
6.0000 mg | ORAL_TABLET | Freq: Once | ORAL | Status: DC
Start: 2012-03-16 — End: 2012-03-16
  Filled 2012-03-16: qty 1

## 2012-03-16 MED ORDER — WARFARIN SODIUM 6 MG PO TABS
6.0000 mg | ORAL_TABLET | Freq: Once | ORAL | Status: AC
  Administered 2012-03-16: 6 mg via ORAL
  Filled 2012-03-16: qty 1

## 2012-03-16 MED ORDER — ALPRAZOLAM 0.25 MG PO TABS
0.2500 mg | ORAL_TABLET | Freq: Two times a day (BID) | ORAL | Status: DC | PRN
  Filled 2012-03-16: qty 1

## 2012-03-16 NOTE — Progress Notes (Addendum)
ANTICOAGULATION CONSULT NOTE - Follow Up Consult  Pharmacy Consult for Coumadin Indication: atrial fibrillation and SMA embolus s/p embolectomy  Allergies  Allergen Reactions  . Penicillins     angioedema  . Alendronate Sodium     REACTION: INTOL to Fosamax per pt.  . Atorvastatin     REACTION: INTOL to Lipitor per pt.   Labs:  Basename 03/16/12 0500 03/15/12 0640 03/14/12 0600  HGB 9.4* 10.3* --  HCT 29.2* 31.6* 29.7*  PLT 307 289 261  APTT -- -- --  LABPROT 16.9* 15.4* 15.0  INR 1.35 1.19 1.16  HEPARINUNFRC -- -- --  CREATININE -- -- 0.60  CKTOTAL -- -- --  CKMB -- -- --  TROPONINI -- -- --    Estimated Creatinine Clearance: 53 ml/min (by C-G formula based on Cr of 0.6).   Assessment: Lisa Douglas is known to pharmacy from prior management of heparin/Coumadin. She is POD11 from embolectomy of SMA with patch angioplasty and POD9 from exp lap evacuation of intraabdominal hematoma. She has a hx of Afib, not previously on chronic anticoag d/t hx of AVMs. Now to begin Coumadin d/t concern for afib related SMA embolus.  INR=1.35 today  Goal of Therapy:  INR 2-2.5 Monitor platelets by anticoagulation protocol: Yes   Plan:  - Repeat Coumadin 6 mg po x 1 today - Planning to discharge home on Coumadin 4 mg daily - Will f/up daily INR  Okey Regal, PharmD [phone]  -03/16/2012 9:41 AM

## 2012-03-16 NOTE — Progress Notes (Signed)
Occupational Therapy Treatment Patient Details Name: Lisa Douglas MRN: [id number] DOB: June 01, 1928 Today's Date: 03/16/2012 Time: 5284-1324 OT Time Calculation (min): 15 min  OT Assessment / Plan / Recommendation Comments on Treatment Session Pt. progressing in goals and did well in session.     Follow Up Recommendations  Home health OT;Supervision/Assistance - 24 hour    Barriers to Discharge       Equipment Recommendations  None recommended by OT    Recommendations for Other Services    Frequency Min 2X/week   Plan Discharge plan remains appropriate    Precautions / Restrictions Precautions Precautions: Fall Restrictions Weight Bearing Restrictions: No   Pertinent Vitals/Pain No pain reported.    ADL  Grooming: Performed;Wash/dry hands;Teeth care;Modified independent Where Assessed - Grooming: Supported standing Toilet Transfer: Performed;Modified independent Toilet Transfer Method: Sit to Barista: Regular height toilet;Grab bars Toileting - Clothing Manipulation and Hygiene: Performed;Supervision/safety Where Assessed - Engineer, mining and Hygiene: Standing Equipment Used: Gait belt;Rolling walker ADL Comments: Pt. did well in session. She ambulated to bathroom and performed toilet transfer with Mod I and required supervision for safety while performing hygiene and clothing manipulation while standing. Pt. brushed teeth at sink while standing supported with Mod I, but stated that she needed to sit down because she was tired. OT educated on energy conservation.      OT Goals Acute Rehab OT Goals OT Goal Formulation: With patient Time For Goal Achievement: 03/20/12 Potential to Achieve Goals: Good ADL Goals Pt Will Perform Grooming: with modified independence;Standing at sink ADL Goal: Grooming - Progress: Met Pt Will Perform Upper Body Bathing: with modified independence;Sitting at sink Pt Will Perform Lower Body Bathing:  with modified independence;Sit to stand from chair Pt Will Perform Upper Body Dressing: with modified independence;Sitting, bed;Sitting, chair Pt Will Perform Lower Body Dressing: with modified independence;Sit to stand from chair;Sit to stand from bed Pt Will Transfer to Toilet: with modified independence;Ambulation;Regular height toilet ADL Goal: Toilet Transfer - Progress: Met Pt Will Perform Toileting - Clothing Manipulation: with modified independence;Sitting on 3-in-1 or toilet (Able to perform clothing mani. with supervision standing) ADL Goal: Toileting - Clothing Manipulation - Progress: Met Pt Will Perform Toileting - Hygiene: with modified independence;Leaning right and/or left on 3-in-1/toilet ADL Goal: Toileting - Hygiene - Progress: Other (comment) (pt. able to stand and perform hygiene with Supervision)  Visit Information  Last OT Received On: 03/16/12 Assistance Needed: +1    Subjective Data   Pt. Reported she was tired after brushing teeth.   Prior Functioning       Cognition  Overall Cognitive Status: Appears within functional limits for tasks assessed/performed Arousal/Alertness: Awake/alert Orientation Level: Appears intact for tasks assessed Behavior During Session: [facility] for tasks performed    Mobility Bed Mobility Bed Mobility: Supine to Sit;Sitting - Scoot to [place] of Bed;Sit to Supine;Scooting to [facility] Supine to Sit: HOB flat;6: Modified independent (Device/Increase time) Sitting - Scoot to Edge of Bed: 6: Modified independent (Device/Increase time) Sit to Supine: 6: Modified independent (Device/Increase time);HOB flat Scooting to HOB: 6: Modified independent (Device/Increase time) Transfers Transfers: Sit to Stand;Stand to Sit Sit to Stand: 6: Modified independent (Device/Increase time);From bed;From toilet Stand to Sit: 6: Modified independent (Device/Increase time);To bed;To toilet         End of Session OT - End of Session Activity Tolerance: Patient  tolerated treatment well Patient left: in bed;with call bell/phone within reach  GO     Jenell Milliner 03/16/2012, 3:18 PM

## 2012-03-16 NOTE — Progress Notes (Signed)
Physical Therapy Treatment Patient Details Name: Lisa Douglas MRN: [id number] DOB: 30-Jul-1928 Today's Date: 03/16/2012 Time: 4098-1191 PT Time Calculation (min): 12 min  PT Assessment / Plan / Recommendation Comments on Treatment Session  Pt making good progress.    Follow Up Recommendations  Home health PT;Supervision - Intermittent    Barriers to Discharge        Equipment Recommendations  None recommended by PT    Recommendations for Other Services    Frequency Min 3X/week   Plan Discharge plan remains appropriate;Frequency remains appropriate    Precautions / Restrictions Precautions Precautions: Fall Restrictions Weight Bearing Restrictions: No   Pertinent Vitals/Pain N/A    Mobility  Bed Mobility Sit to Supine: HOB flat;6: Modified independent (Device/Increase time) Transfers Sit to Stand: 6: Modified independent (Device/Increase time);From bed;From toilet;With upper extremity assist Stand to Sit: 6: Modified independent (Device/Increase time);To chair/3-in-1;To toilet;With armrests;With upper extremity assist Ambulation/Gait Ambulation/Gait Assistance: 5: Supervision Ambulation Distance (Feet): 250 Feet Assistive device: Rolling walker Ambulation/Gait Assistance Details: verbal cues to stay closer to walker and to stand more erect especially when turning. Gait Pattern: Step-through pattern;Decreased stride length    Exercises     PT Diagnosis:    PT Problem List:   PT Treatment Interventions:     PT Goals Acute Rehab PT Goals PT Goal: Supine/Side to Sit - Progress: Met PT Goal: Stand to Sit - Progress: Met PT Goal: Ambulate - Progress: Progressing toward goal  Visit Information  Last PT Received On: 03/16/12 Assistance Needed: +1    Subjective Data  Subjective: "I don't know," pt stated when asked if she was going to get to go home soon.   Cognition  Overall Cognitive Status: Appears within functional limits for tasks  assessed/performed Arousal/Alertness: Awake/alert Orientation Level: Appears intact for tasks assessed Behavior During Session: [facility] for tasks performed    Balance  Static Standing Balance Static Standing - Balance Support: No upper extremity supported;During functional activity Static Standing - Level of Assistance: 6: Modified independent (Device/Increase time)  End of Session PT - End of Session Activity Tolerance: Patient tolerated treatment well Patient left: in chair;with call bell/phone within reach   GP     [facility] 03/16/2012, 11:53 AM  [facility] PT [phone]

## 2012-03-16 NOTE — Progress Notes (Signed)
I agree with the following treatment note after reviewing documentation.   [place], Roan Miklos Brynn   OTR/L Pager: [phone] Office: [phone] .   

## 2012-03-16 NOTE — Progress Notes (Signed)
PCP: Michele Mcalpine, MD  Brief HPI:  78 y.o. female with history of chronic atrial fibrillation (not on full anticoagulation due to AVM), anxiety, GERD, and hyperlipidemia, who presented to the emergency room complaining of the acute onset of abdominal pain for 2 days, accompanied with nausea and vomiting along with watery diarrhea. She denied any recent antibiotic. She denied black stool or bloody stool. She denied any fever or chills. Evaluation in emergency room included an abdominal pelvic CT which revealed a superior mesenteric artery non-occlusive thrombus, but no evidence of ischemic bowel (along with incidental finding of a small pulmonary nodule and calcification in the aorta),S/P embolectomy, had  Retroperitoneal bleed on anti coag, now resumed on warfarin.  Past medical history:  Past Medical History  Diagnosis Date  . Macular degeneration (senile) of retina, unspecified   . Atrial fibrillation   . Unspecified venous (peripheral) insufficiency   . Other and unspecified hyperlipidemia   . Esophageal reflux   . Diverticulosis of colon (without mention of hemorrhage)   . Angiodysplasia of intestine (without mention of hemorrhage)   . Osteoarthrosis, unspecified whether generalized or localized, unspecified site   . Osteoporosis, unspecified   . Anxiety state, unspecified     Consultants:  Vascular surgery  [facility] - telephone consultation [facility]: Over the phone  Procedures:  03/03/2012 - embolectomy of the superior mesenteric artery with bovine pericardial patch angioplasty of superior mesenteric artery  03/07/2012 - exploratory laparotomy with evacuation of intra-abdom hematoma   Subjective: Patient feels well. Denies any pain. No Cp or SOB. Ambulated with a walker and did well.  Objective: Vital signs in last 24 hours: Temp:  [97.3 F (36.3 C)-98 F (36.7 C)] 97.3 F (36.3 C) (07/25 1338) Pulse Rate:  [72-82] 72  (07/25 1338) Resp:  [16-18] 16  (07/25  1338) BP: (97-105)/(61-68) 97/61 mmHg (07/25 1338) SpO2:  [90 %-92 %] 90 % (07/25 1338) Weight:  [69.3 kg (152 lb 12.5 oz)] 69.3 kg (152 lb 12.5 oz) (07/25 0409) Weight change: -2.8 kg (-6 lb 2.8 oz) Last BM Date: 03/15/12  Intake/Output from previous day: 07/24 0701 - 07/25 0700 In: 360 [P.O.:360] Out: 3 [Urine:2; Stool:1] Intake/Output this shift: Total I/O In: 240 [P.O.:240] Out: -   General appearance: alert, cooperative, appears stated age and no distress Resp: clear to auscultation bilaterally Cardio: irregularly irregular, normal rate. No murmur, click, rub or gallop GI: soft, non-tender; bowel sounds normal; no masses,  no organomegaly. Incision site appears to be clean Extremities: extremities normal, atraumatic, no cyanosis. Pitting edema is noted. Skin: Skin color, texture, turgor normal. No rashes or lesions Neurologic: Alert and oriented. No focal deficits.  Lab Results:  Basename 03/16/12 0500 03/15/12 0640  WBC 8.7 8.7  HGB 9.4* 10.3*  HCT 29.2* 31.6*  PLT 307 289   BMET  Basename 03/14/12 0600  NA 140  K 3.9  CL 102  CO2 31  GLUCOSE 95  BUN 7  CREATININE 0.60  CALCIUM 8.5  ALT --    Studies/Results: No results found.  Medications:  Scheduled:    . diltiazem  240 mg Oral Daily  . feeding supplement  237 mL Oral TID BM  . furosemide  40 mg Oral Daily  . metoprolol tartrate  75 mg Oral BID  . multivitamin  1 tablet Oral Daily  . multivitamin with minerals  1 tablet Oral Daily  . pantoprazole  40 mg Oral QAC breakfast  . potassium chloride  20 mEq Oral Daily  .  warfarin  6 mg Oral ONCE-1800  . warfarin  6 mg Oral ONCE-1800  . Warfarin - Pharmacist Dosing Inpatient   Does not apply q1800  . DISCONTD: warfarin  6 mg Oral Once   Continuous:   ZOX:[id number], bisacodyl, dextromethorphan, HYDROmorphone (DILAUDID) injection, metoprolol, ondansetron, phenol, sodium chloride  Assessment/Plan:  Principal Problem:  *Superior mesenteric  artery thrombosis Active Problems:  HYPERLIPIDEMIA  ATRIAL FIBRILLATION, CHRONIC  ANGIODYSPLASIA, COLON  Retroperitoneal hematoma  Anemia due to blood loss, acute  Atrial fibrillation with RVR  Malnutrition of moderate degree  Pedal edema    Acute Superior mesenteric artery thrombosis  Felt to likely represent embolic phenomenon related to atrial fibrillation in that there were no preceding symptoms consistent with gut claudication - now status post embolectomy of the superior mesenteric artery with bovine pericardial patch angioplasty of superior mesenteric artery. Anticoagulation discussed with Dr. Shirlee Latch (on 7/21) with [facility] and he recommended Warfarin instead of alternative agents due to ease of reversal. Will aim for INR between 2 and 2.5.  Acute blood loss anemia with intra-abdominal hematoma  Since Hgb continued to drift downward, CTabdom was done - this revealed findings worrisome for intr-abdom bleeding - the pt was taken to the OR on 03/07/12 for an explor lap wch revealed an intra-abdom hematoma w/o evidence of active bleeding. Monitor CBC closely while she is being anticoagulated  ATRIAL FIBRILLATION, CHRONIC with RVR HR is better controlled. Had discussed with Dr, Shirlee Latch on 7/21 and he recommended increasing dose of her BB and CCB. Patient remains asymptomatic. A transthoracic echo reveals no evidence of an intracardiac thrombus. Continue to monitor and adjust meds as needed.  Pedal edema She has been in positive fluid balance during this hospitalization. Will giver her Lasix IV for now. Change to oral Lasix from 7/24. Add KCL as well.   HYPERLIPIDEMIA  Resume treatment when able to take oral meds/food consistently   Malnutrition Ensure. Encourage PO intake.  Possible right lung nodule  This can be pursued as an OP by her PCP   ANGIODYSPLASIA, COLON with prior history of GI bleeding  Patient was previously taken off of Coumadin due to known AVMs (pt now states she  never actually had a GIB, but was simply found to have AVMs)   DVT Prophylaxis SCD's  Possible D/C in am ,where she'll follow INR levels with [facility].  Code Status: Full   Disposition Plan: Will need HH which has been ordered. Will need to watch for bleeding as her INR becomes therapeutic. Anticipate discharge in 2-3 days once INR is therapeutic and if no evidence for rebleeding. She will need INR and CBC checks at home.     LOS: 14 days   [facility], Kirrah Mustin  [facility] Pager [phone] 03/16/2012, 4:07 PM

## 2012-03-16 NOTE — Progress Notes (Signed)
VASCULAR PROGRESS NOTE  SUBJECTIVE: Tolerating diet. No pain  PHYSICAL EXAM: Filed Vitals:   03/15/12 0522 03/15/12 1341 03/15/12 2044 03/16/12 0409  BP: 114/58 109/61 105/68 99/65  Pulse: 83 82 82 76  Temp: 98 F (36.7 C) 97 F (36.1 C) 98 F (36.7 C) 98 F (36.7 C)  TempSrc: Axillary Oral Oral Oral  Resp: 18 12 18 18   Height:      Weight: 158 lb 15.2 oz (72.1 kg)   152 lb 12.5 oz (69.3 kg)  SpO2: 90% 93% 92% 90%   ABD: soft, NT Incision fine  LABS: Lab Results  Component Value Date   WBC 8.7 03/16/2012   HGB 9.4* 03/16/2012   HCT 29.2* 03/16/2012   MCV 94.5 03/16/2012   PLT 307 03/16/2012   Lab Results  Component Value Date   INR 1.35 03/16/2012   ASSESSMENT/PLAN: 1. Doing well s/p SMA embolectomy. 2. I'll arrange f/u in 3 weeks.  Waverly Ferrari, MD, FACS Beeper: [phone] 03/16/2012

## 2012-03-17 ENCOUNTER — Other Ambulatory Visit: Payer: Self-pay | Admitting: Cardiology

## 2012-03-17 DIAGNOSIS — D649 Anemia, unspecified: Secondary | ICD-10-CM

## 2012-03-17 LAB — PROTIME-INR: Prothrombin Time: 20.9 seconds — ABNORMAL HIGH (ref 11.6–15.2)

## 2012-03-17 MED ORDER — DILTIAZEM HCL ER COATED BEADS 240 MG PO CP24: 240.0000 mg | ORAL_CAPSULE | Freq: Every day | ORAL | Status: DC

## 2012-03-17 MED ORDER — POTASSIUM CHLORIDE ER 10 MEQ PO TBCR: 10.0000 meq | EXTENDED_RELEASE_TABLET | Freq: Two times a day (BID) | ORAL | Status: DC

## 2012-03-17 MED ORDER — WARFARIN SODIUM 4 MG PO TABS
4.0000 mg | ORAL_TABLET | Freq: Once | ORAL | Status: DC
Start: 2012-03-17 — End: 2012-03-17
  Filled 2012-03-17: qty 1

## 2012-03-17 MED ORDER — WARFARIN SODIUM 3 MG PO TABS: 3.0000 mg | ORAL_TABLET | Freq: Every day | ORAL | Status: DC

## 2012-03-17 MED ORDER — METOPROLOL TARTRATE 25 MG PO TABS: 75.0000 mg | ORAL_TABLET | Freq: Two times a day (BID) | ORAL | Status: DC

## 2012-03-17 NOTE — Discharge Summary (Signed)
[facility]                                                                                   Lisa Douglas, is a 78 y.o. female  DOB July 02, 1928  MRN [id number].  Admission date:  03/02/2012  Discharge Date:  03/17/2012  Primary MD  Michele Mcalpine, MD  Admitting Physician  Michele Mcalpine, MD  Admission Diagnosis  Abdominal pain [789.00] Abd pain; diarrhea; Emesis AAA Bleeding  Discharge Diagnosis     Principal Problem:  *Superior mesenteric artery thrombosis Active Problems:  HYPERLIPIDEMIA  ATRIAL FIBRILLATION, CHRONIC  ANGIODYSPLASIA, COLON  Retroperitoneal hematoma  Anemia due to blood loss, acute  Atrial fibrillation with RVR  Malnutrition of moderate degree  Pedal edema      Past Medical History  Diagnosis Date  . Macular degeneration (senile) of retina, unspecified   . Atrial fibrillation   . Unspecified venous (peripheral) insufficiency   . Other and unspecified hyperlipidemia   . Esophageal reflux   . Diverticulosis of colon (without mention of hemorrhage)   . Angiodysplasia of intestine (without mention of hemorrhage)   . Osteoarthrosis, unspecified whether generalized or localized, unspecified site   . Osteoporosis, unspecified   . Anxiety state, unspecified     Past Surgical History  Procedure Date  . Appendectomy 1951  . Tonsillectomy 1943  . Laparotomy 03/07/2012    Procedure: EXPLORATORY LAPAROTOMY;  Surgeon: Chuck Hint, MD;  Location: [facility] OR;  Service: Vascular;  Laterality: N/A;  Exploratory Laparotomy with Evacuation of Hematoma.     Recommendations for primary care physician for things to follow:  Follow INR and H&H,   Discharge Diagnoses:   Principal Problem:  *Superior mesenteric artery thrombosis Active Problems:  HYPERLIPIDEMIA  ATRIAL FIBRILLATION, CHRONIC  ANGIODYSPLASIA, COLON  Retroperitoneal hematoma  Anemia due to blood loss, acute  Atrial fibrillation with RVR  Malnutrition of moderate degree  Pedal edema    Discharge Condition: stable   Diet recommendation: See Discharge Instructions below   Consults  Vascular surgery  [facility] - telephone consultation  [facility]: Over the phone    History of present illness and  Hospital Course:  See H&P, Labs, Consult and Test reports for all details in brief,   78 y.o. female with history of chronic atrial fibrillation (not on full anticoagulation due to AVM), anxiety, GERD, and hyperlipidemia, who presented to the emergency room complaining of the acute onset of abdominal pain for 2 days, accompanied with nausea and vomiting along with watery diarrhea. She denied any recent antibiotic. She denied black stool or bloody stool. She denied any fever or chills. Evaluation in emergency room included an abdominal pelvic CT which revealed a superior mesenteric artery non-occlusive thrombus, but no evidence of ischemic bowel (along with incidental finding of a small pulmonary nodule and calcification in the aorta), status post embolectomy of the superior mesenteric artery with bovine pericardial patch angioplasty of superior mesenteric artery by Dr. Waverly Ferrari, patient was resumed back on anticoagulation, patient did develop intra-abdominal hematoma, where she had exploratory laparotomy with evacuation of intra-abdominal hematoma on July 16, discussion was carried with vascular surgery and cardiology and recommendation  was to resume anticoagulation with date close observation, so patient was resumed back on warfarin, most recent INR 1.77 as of today, patient to be discharged on 3 mg of warfarin were she will follow with lobar warfarin clinic on Monday at 9:30 AM to check her hemoglobin and INR level.  Acute Superior mesenteric artery thrombosis  Felt to likely represent embolic phenomenon related to atrial fibrillation in that there were no preceding symptoms consistent with gut claudication - now status post embolectomy of the superior  mesenteric artery with bovine pericardial patch angioplasty of superior mesenteric artery. Anticoagulation discussed with Dr. Shirlee Latch (on 7/21) with [facility] and he recommended Warfarin instead of alternative agents due to ease of reversal. Will aim for INR between 2 and 2.5.  Acute blood loss anemia with intra-abdominal hematoma  Since Hgb continued to drift downward, CTabdom was done - this revealed findings worrisome for intr-abdom bleeding - the pt was taken to the OR on 03/07/12 for an explor lap wch revealed an intra-abdom hematoma w/o evidence of active bleeding. Monitor CBC closely while she is being anticoagulated  ATRIAL FIBRILLATION, CHRONIC with RVR  HR is better controlled. Had discussed with Dr, Shirlee Latch on 7/21 and he recommended increasing dose of her BB . Patient remains asymptomatic. A transthoracic echo reveals no evidence of an intracardiac thrombus.         Today   Subjective:   Lisa Douglas today has no headache,no chest abdominal pain,no new weakness tingling or numbness, feels much better wants to go home today.   Objective:   Blood pressure 102/60, pulse 105, temperature 97.6 F (36.4 C), temperature source Oral, resp. rate 18, height 5\' 5"  (1.651 m), weight 69 kg (152 lb 1.9 oz), SpO2 91.00%.  No intake or output data in the 24 hours ending 03/17/12 1233  Exam General appearance: alert, cooperative, appears stated age and no distress  Resp: clear to auscultation bilaterally  Cardio: irregularly irregular, normal rate. No murmur, click, rub or gallop  GI: soft, non-tender; bowel sounds normal; no masses, no organomegaly. Incision site appears to be clean  Extremities: extremities normal, atraumatic, no cyanosis. Pitting edema is noted.  Skin: Skin color, texture, turgor normal. No rashes or lesions  Neurologic: Alert and oriented. No focal deficits.     Data Review   Major procedures and Radiology Reports - PLEASE review detailed and final reports for all  details in brief -      Ct Abdomen Pelvis Wo Contrast  03/07/2012  *RADIOLOGY REPORT*  Clinical Data: Anemia, evaluate for possible retroperitoneal bleed, on anticoagulation  CT ABDOMEN AND PELVIS WITHOUT CONTRAST  Technique:  Multidetector CT imaging of the abdomen and pelvis was performed following the standard protocol without intravenous contrast.  Comparison: CT abdomen and pelvis of 03/03/2012  Findings: There are now small to moderate-sized bilateral pleural effusions present with basilar atelectasis.  The liver is very inhomogeneous on this unenhanced study with multiple low attenuation lesions consistent with cyst.  The periphery of the liver is somewhat nodular raising the suspicion of cirrhosis with probable areas of fatty infiltration as well.  There is a ascites present which is a new finding.  However this peritoneal fluid has a higher attenuation than expected for ascites and could represent blood.  Higher attenuation contrast layers within the gallbladder presumably due to vicarious excretion of contrast.  The pancreas demonstrates fatty infiltration and the pancreatic duct is not dilated.  The adrenal glands and spleen are unremarkable other than some higher  attenuation fluid adjacent to the spleen as well.  The stomach is decompressed.  In the unenhanced state, the kidneys are unremarkable with an extrarenal pelvis again noted on the left. The abdominal aorta is normal in caliber.  There is an oval collection of fluid within the anterior right lower abdomen and upper pelvis with apparent blood serum interface consistent with acute intraperitoneal hematoma.  In addition adjacent to the SMA within the midabdomen there are several similar attenuation collections of fluid presumably representing acute blood near the  SMA.  A few air bubbles are noted within the peritoneal cavity as well most likely due to the recent SMA embolectomy.  Higher attenuation fluid layers dependently within the pelvis  as well consistent with acute intraperitoneal blood. The urinary bladder is unremarkable containing air most likely due to recent catheterization.  The uterus appears atrophic.  Multiple rectosigmoid colonic diverticula are noted.  IMPRESSION:  1.  Intraperitoneal collection of blood with blood serum level worrisome for acute intraperitoneal bleeding in view of the high attenuation on this unenhanced study. 2.  Several smaller high attenuation collections within the mid abdomen near the SMA worrisome for foci of acute bleeding as well. 3.  Higher attenuation fluid consistent with blood throughout the peritoneal cavity and layering within the pelvis posteriorly. 4.  Small to moderate-sized bilateral pleural effusions.  Critical Value/emergent results were called by telephone at the time of interpretation on 03/07/2012 at 4:58 p.m. to Dr. Cari Caraway, who verbally acknowledged these results.  Original Report Authenticated By: Juline Patch, M.D.   Ct Abdomen Pelvis W Contrast  03/03/2012  *RADIOLOGY REPORT*  Clinical Data: Abdominal pain, nausea, vomiting and diarrhea after eating; leukocytosis.  CT ABDOMEN AND PELVIS WITH CONTRAST  Technique:  Multidetector CT imaging of the abdomen and pelvis was performed following the standard protocol during bolus administration of intravenous contrast.  Contrast: OMNIPAQUE IOHEXOL 300 MG/ML  SOLN  Comparison: None.  Findings: Minimal bibasilar atelectasis is noted.  A 7 mm pleural based nodule is noted at the left lung base.  Numerous scattered hypodensities are noted within the liver.  These most likely reflect cysts, given their appearance and multiplicity. The liver is otherwise grossly unremarkable in appearance.  The spleen is within normal limits.  The pancreas and adrenal glands are unremarkable.  The triangular appearance of the right adrenal gland is thought to be developmental in nature.  The gallbladder is mildly decompressed and grossly unremarkable in  appearance.  Scattered bilateral renal cysts are noted, with a dominant left renal parapelvic cyst measuring 3.6 cm in size.  The kidneys are otherwise unremarkable in appearance.  There is no evidence of hydronephrosis.  No renal or ureteral stones are seen.  No perinephric stranding is appreciated.  No free fluid is identified.  The small bowel is unremarkable in appearance.  Postoperative change is noted at the stomach.  No acute vascular abnormalities are seen.  Scattered calcification is noted along the abdominal aorta and its branches, including at the origins of both renal arteries.  There is suggestion of nonocclusive thrombus within the superior mesenteric artery; no associated ischemic bowel is identified on CT at this time.  The patient is status post appendectomy.  The colon is largely decompressed.  Diffuse diverticulosis is noted along the sigmoid colon, without evidence of diverticulitis.  The colon is otherwise unremarkable in appearance.  The bladder is decompressed and not well assessed.  The uterus is grossly unremarkable in appearance.  The ovaries are grossly  symmetric; no suspicious adnexal masses are seen.  No inguinal lymphadenopathy is seen.  No acute osseous abnormalities are identified.  IMPRESSION:  1.  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  No associated ischemic bowel identified on CT at this time.  However, this may explain the patient's symptoms. 2.  Diffuse diverticulosis along the sigmoid colon, without evidence of diverticulitis. 3.  Scattered calcification along the abdominal aorta and its branches, including at the origins of both renal arteries. 4.  Scattered bilateral renal cysts and diffuse small hepatic cysts seen. 5.  7 mm pleural based nodule noted at the left lung base.  If the patient is at high risk for bronchogenic carcinoma, follow-up chest CT at 3-6 months is recommended.  If the patient is at low  risk for bronchogenic carcinoma, follow-up chest CT at 6-12 months is recommended.  This recommendation follows the consensus statement: Guidelines for Management of Small Pulmonary Nodules Detected on CT Scans: A Statement from the Fleischner Society as published in Radiology 2005; 237:395-400.  These results were called by telephone on 03/03/2012  at  02:58 a.m. to  Renne Crigler, who verbally acknowledged these results.  Original Report Authenticated By: Tonia Ghent, M.D.   Dg Chest Portable 1 View  03/03/2012  *RADIOLOGY REPORT*  Clinical Data: Postop abdominal surgery.  Line placement.  PORTABLE CHEST - 1 VIEW  Comparison: 09/30/2010.  Findings: 1747 hours.  Nasogastric tube projects below the diaphragm.  Oxygen tubing and telemetry leads are noted.  No central venous catheter is visualized.  There are low lung volumes with mild bibasilar atelectasis.  There is asymmetric density in the right apex which has increased compared with the prior study. No pleural effusion or pneumothorax is seen.  Heart size and mediastinal contours are stable.  IMPRESSION:  1.  No central line visible.  Nasogastric tube projects below the diaphragm. 2.  Bibasilar atelectasis.  Asymmetric right apical density has increased from the prior examination and could reflect inflammation or atelectasis.  Radiographic followup is recommended to exclude a developing nodule.  Original Report Authenticated By: Gerrianne Scale, M.D.   Dg Abd Portable 1v  03/03/2012  *RADIOLOGY REPORT*  Clinical Data: Postop for blood clot in the abdomen.  PORTABLE ABDOMEN - 1 VIEW  Comparison: Abdominal CT 03/03/2012.  Findings: 1744 hours.  Nasogastric tube tip is in the distal stomach or proximal duodenum.  There is contrast material within the colon. Diverticular changes are present in the sigmoid colon. No extravasated contrast is identified. There is no evidence of bowel obstruction or free intraperitoneal air.  IMPRESSION: No acute abdominal  findings identified.  Nasogastric tube tip in the distal stomach or proximal duodenum.  Original Report Authenticated By: Gerrianne Scale, M.D.     Recent Results (from the past 240 hour(s))  CLOSTRIDIUM DIFFICILE BY PCR     Status: Normal   Collection Time   03/12/12  5:45 PM      Component Value Range Status Comment   C difficile by pcr NEGATIVE  NEGATIVE Final      CBC w Diff:  Lab Results  Component Value Date   WBC 8.7 03/16/2012   HGB 10.4* 03/17/2012   HCT 32.1* 03/17/2012   PLT 307 03/16/2012   LYMPHOPCT 8* 03/02/2012   MONOPCT 5 03/02/2012   EOSPCT 0 03/02/2012   BASOPCT 0 03/02/2012    CMP:  Lab Results  Component Value Date   NA 140 03/14/2012  K 3.9 03/14/2012   CL 102 03/14/2012   CO2 31 03/14/2012   BUN 7 03/14/2012   CREATININE 0.60 03/14/2012   PROT 4.5* 03/08/2012   ALBUMIN 2.0* 03/08/2012   BILITOT 0.8 03/08/2012   ALKPHOS 55 03/08/2012   AST 35 03/08/2012   ALT 22 03/08/2012  .   Discharge Instructions     Follow with Primary MD NADEL,SCOTT M, MD in 7 days   Get CBC, CMP, checked 7 days by Primary MD and again as instructed by your Primary MD. Get a 2 view Chest X ray done next visit if you had Pneumonia of Lung problems at the Hospital.  Get Medicines reviewed and adjusted.  Please request your Prim.MD to go over all Hospital Tests and Procedure/Radiological results at the follow up, please get all Hospital records sent to your Prim MD by signing hospital release before you go home.  Activity: As tolerated with Full fall precautions use walker/cane & assistance as needed   Diet: Heart healthy , Aspiration precautions.  For Heart failure patients - Check your Weight same time everyday, if you gain over 2 pounds, or you develop in leg swelling, experience more shortness of breath or chest pain, call your Primary MD immediately. Follow Cardiac Low Salt Diet and 1.8 lit/day fluid restriction.  Disposition Home   If you experience worsening of your  admission symptoms, develop shortness of breath, life threatening emergency, suicidal or homicidal thoughts you must seek medical attention immediately by calling 911 or calling your MD immediately  if symptoms less severe.  You Must read complete instructions/literature along with all the possible adverse reactions/side effects for all the Medicines you take and that have been prescribed to you. Take any new Medicines after you have completely understood and accpet all the possible adverse reactions/side effects.   Do not drive if your were admitted for syncope or siezures until you have seen by Primary MD or a Neurologist and advised to drive.  Do not drive when taking Pain medications.    Do not take more than prescribed Pain, Sleep and Anxiety Medications  Special Instructions: If you have smoked or chewed Tobacco  in the last 2 yrs please stop smoking, stop any regular Alcohol  and or any Recreational drug use.  Wear Seat belts while driving.   Follow-up Information    Follow up with NADEL,SCOTT M, MD. Schedule an appointment as soon as possible for a visit in 1 week.   Contact information:   [facility] [employer] [address] [place] [address] [phone]       Follow up with DICKSON,CHRISTOPHER S, MD. Schedule an appointment as soon as possible for a visit in 3 weeks.   Contact information:   [address] [place] [address] [phone]            Discharge Medications   Medication List  As of 03/17/2012 12:33 PM   START taking these medications         metoprolol tartrate 25 MG tablet   Commonly known as: LOPRESSOR   Take 3 tablets (75 mg total) by mouth 2 (two) times daily.      potassium chloride 10 MEQ tablet   Commonly known as: K-DUR   Take 1 tablet (10 mEq total) by mouth 2 (two) times daily.      warfarin 3 MG tablet   Commonly known as: COUMADIN   Take 1 tablet (3 mg total) by mouth daily.  CONTINUE  taking these medications         ALPRAZolam 0.5 MG tablet   Commonly known as: XANAX      CALTRATE 600+D 600-400 MG-UNIT per tablet   Generic drug: Calcium Carbonate-Vitamin D      * CENTRUM SILVER PO      * Eye Vitamins Tabs      cholecalciferol 1000 UNITS tablet   Commonly known as: VITAMIN D      omeprazole 20 MG tablet   Commonly known as: PRILOSEC OTC      simvastatin 20 MG tablet   Commonly known as: ZOCOR   TAKE ONE TABLET BY MOUTH AT BEDTIME     * Notice: This list has 2 medication(s) that are the same as other medications prescribed for you. Read the directions carefully, and ask your doctor or other care provider to review them with you.       STOP taking these medications         aspirin 81 MG tablet      diltiazem 180 MG 24 hr capsule      metoprolol succinate 25 MG 24 hr tablet          Where to get your medications    These are the prescriptions that you need to pick up. We sent them to a specific pharmacy, so you will need to go there to get them.   [id number] - [place], [place] - [address]    [address] [place] [address]    Phone: [phone]        metoprolol tartrate 25 MG tablet   potassium chloride 10 MEQ tablet   warfarin 3 MG tablet               Total Time in preparing paper work, data evaluation and todays exam - 35 minutes  Nakita Santerre M.D on 03/17/2012 at 12:33 PM  [facility] Office  [phone]

## 2012-03-17 NOTE — Progress Notes (Signed)
Physical Therapy Treatment Patient Details Name: Lisa Douglas MRN: [id number] DOB: 1928-02-14 Today's Date: 03/17/2012 Time: 4782-9562 PT Time Calculation (min): 23 min  PT Assessment / Plan / Recommendation Comments on Treatment Session  Pt s/p SMA embolectomy progressing with mobility but still limited with posture and RW use with ambulation. Pt denied attempting any leg exercises today. Pt able to perform selfcare after BM but fatigued after and required 3 min rest prior to ambulation. Family present and educated for cueing for RW use.     Follow Up Recommendations       Barriers to Discharge        Equipment Recommendations       Recommendations for Other Services    Frequency     Plan Discharge plan remains appropriate;Frequency remains appropriate    Precautions / Restrictions Precautions Precautions: Fall   Pertinent Vitals/Pain No pain    Mobility  Bed Mobility Bed Mobility: Not assessed Transfers Transfers: Sit to Stand;Stand to Sit Sit to Stand: 6: Modified independent (Device/Increase time);From chair/3-in-1;From toilet Stand to Sit: 6: Modified independent (Device/Increase time);To toilet;To chair/3-in-1 Details for Transfer Assistance: x 3 trials with one episode of sitting without control of descent and educated for importance of bil UE use ot control descent and prevent fall Ambulation/Gait Ambulation/Gait Assistance: 5: Supervision Ambulation Distance (Feet): 300 Feet Assistive device: Rolling walker Ambulation/Gait Assistance Details: cueing to step into RW, extend trunk and look up Gait Pattern: Step-through pattern;Decreased stride length;Trunk flexed Stairs: Yes Stairs Assistance: 6: Modified independent (Device/Increase time) Stairs Assistance Details (indicate cue type and reason): pt completed without assist Stair Management Technique: One rail Right Number of Stairs: 3     Exercises     PT Diagnosis:    PT Problem List:   PT Treatment  Interventions:     PT Goals Acute Rehab PT Goals PT Goal: Ambulate - Progress: Progressing toward goal PT Goal: Up/Down Stairs - Progress: Met  Visit Information  Last PT Received On: 03/17/12 Assistance Needed: +1    Subjective Data  Subjective: I"m just ready to go home   Cognition  Overall Cognitive Status: Appears within functional limits for tasks assessed/performed Arousal/Alertness: Awake/alert Orientation Level: Appears intact for tasks assessed Behavior During Session: [facility] for tasks performed    Balance     End of Session PT - End of Session Activity Tolerance: Patient tolerated treatment well Patient left: in chair;with family/visitor present   GP     Toney Sang Beth 03/17/2012, 1:06 PM Delaney Meigs, PT [phone]

## 2012-03-17 NOTE — Progress Notes (Signed)
ANTICOAGULATION CONSULT NOTE - Follow Up Consult  Pharmacy Consult for Coumadin Indication: atrial fibrillation  Allergies  Allergen Reactions  . Penicillins     angioedema  . Alendronate Sodium     REACTION: INTOL to Fosamax per pt.  . Atorvastatin     REACTION: INTOL to Lipitor per pt.    Patient Measurements: Height: 5\' 5"  (165.1 cm) Weight: 152 lb 1.9 oz (69 kg) IBW/kg (Calculated) : 57  Heparin Dosing Weight:   Vital Signs: Temp: 97.6 F (36.4 C) (07/26 0610) Temp src: Oral (07/26 0610) BP: 102/60 mmHg (07/26 0610) Pulse Rate: 105  (07/26 0610)  Labs:  Basename 03/17/12 0545 03/16/12 0500 03/15/12 0640  HGB 10.4* 9.4* --  HCT 32.1* 29.2* 31.6*  PLT -- 307 289  APTT -- -- --  LABPROT 20.9* 16.9* 15.4*  INR 1.77* 1.35 1.19  HEPARINUNFRC -- -- --  CREATININE -- -- --  CKTOTAL -- -- --  CKMB -- -- --  TROPONINI -- -- --    Estimated Creatinine Clearance: 52.9 ml/min (by C-G formula based on Cr of 0.6).   Medications:  Scheduled:    . diltiazem  240 mg Oral Daily  . feeding supplement  237 mL Oral TID BM  . furosemide  40 mg Oral Daily  . metoprolol tartrate  75 mg Oral BID  . multivitamin  1 tablet Oral Daily  . multivitamin with minerals  1 tablet Oral Daily  . pantoprazole  40 mg Oral QAC breakfast  . potassium chloride  20 mEq Oral Daily  . warfarin  6 mg Oral ONCE-1800  . Warfarin - Pharmacist Dosing Inpatient   Does not apply q1800  . DISCONTD: warfarin  6 mg Oral Once    Assessment: 78yo female s/p embolectomy SMA with patch angioplasty pod#12 and s/p exp lap for evacuation of intra-abd hematoma pod#10.  Coumadin initiated for AFib & concern for SMA embolism.  INR 1.77 today, climbing appropriately.  No bleeding problems noted, Hg 10.4.    Goal of Therapy:  INR 2-3  Plan:  1.  Coumadin 4mg  today 2.  Plan is for home today on Coumadin 4mg  daily  Marisue Humble, PharmD Clinical Pharmacist [facility]

## 2012-03-20 ENCOUNTER — Other Ambulatory Visit (INDEPENDENT_AMBULATORY_CARE_PROVIDER_SITE_OTHER): Payer: Medicare Other

## 2012-03-20 ENCOUNTER — Ambulatory Visit (INDEPENDENT_AMBULATORY_CARE_PROVIDER_SITE_OTHER): Payer: Medicare Other | Admitting: *Deleted

## 2012-03-20 DIAGNOSIS — K55069 Acute infarction of intestine, part and extent unspecified: Secondary | ICD-10-CM

## 2012-03-20 DIAGNOSIS — I4891 Unspecified atrial fibrillation: Secondary | ICD-10-CM

## 2012-03-20 DIAGNOSIS — K661 Hemoperitoneum: Secondary | ICD-10-CM

## 2012-03-20 DIAGNOSIS — Z7901 Long term (current) use of anticoagulants: Secondary | ICD-10-CM | POA: Diagnosis not present

## 2012-03-20 DIAGNOSIS — D649 Anemia, unspecified: Secondary | ICD-10-CM | POA: Diagnosis not present

## 2012-03-20 DIAGNOSIS — K55059 Acute (reversible) ischemia of intestine, part and extent unspecified: Secondary | ICD-10-CM

## 2012-03-20 DIAGNOSIS — R58 Hemorrhage, not elsewhere classified: Secondary | ICD-10-CM

## 2012-03-20 LAB — CBC WITH DIFFERENTIAL/PLATELET
Basophils Absolute: 0 10*3/uL (ref 0.0–0.1)
HCT: 35.5 % — ABNORMAL LOW (ref 36.0–46.0)
Lymphs Abs: 1.3 10*3/uL (ref 0.7–4.0)
Monocytes Absolute: 0.7 10*3/uL (ref 0.1–1.0)
Monocytes Relative: 10.8 % (ref 3.0–12.0)
Platelets: 533 10*3/uL — ABNORMAL HIGH (ref 150.0–400.0)
RDW: 16.3 % — ABNORMAL HIGH (ref 11.5–14.6)

## 2012-03-20 LAB — POCT INR: INR: 3.5

## 2012-03-20 NOTE — Patient Instructions (Addendum)
A full discussion of the nature of anticoagulants has been carried out.  A benefit risk analysis has been presented to the patient, so that they understand the justification for choosing anticoagulation at this time. The need for frequent and regular monitoring, precise dosage adjustment and compliance is stressed.  Side effects of potential bleeding are discussed.  The patient should avoid any OTC items containing aspirin or ibuprofen, and should avoid great swings in general diet.  Avoid alcohol consumption.  Call if any signs of abnormal bleeding. Phone [phone].     

## 2012-03-21 ENCOUNTER — Encounter: Payer: Self-pay | Admitting: Pulmonary Disease

## 2012-03-21 ENCOUNTER — Ambulatory Visit (INDEPENDENT_AMBULATORY_CARE_PROVIDER_SITE_OTHER): Payer: Medicare Other | Admitting: Pulmonary Disease

## 2012-03-21 VITALS — BP 112/62 | HR 96 | Temp 95.2°F | Ht 65.5 in | Wt 147.2 lb

## 2012-03-21 DIAGNOSIS — K55069 Acute infarction of intestine, part and extent unspecified: Secondary | ICD-10-CM

## 2012-03-21 DIAGNOSIS — K55059 Acute (reversible) ischemia of intestine, part and extent unspecified: Secondary | ICD-10-CM | POA: Diagnosis not present

## 2012-03-21 DIAGNOSIS — E785 Hyperlipidemia, unspecified: Secondary | ICD-10-CM | POA: Diagnosis not present

## 2012-03-21 DIAGNOSIS — F411 Generalized anxiety disorder: Secondary | ICD-10-CM

## 2012-03-21 DIAGNOSIS — I872 Venous insufficiency (chronic) (peripheral): Secondary | ICD-10-CM

## 2012-03-21 DIAGNOSIS — I4891 Unspecified atrial fibrillation: Secondary | ICD-10-CM

## 2012-03-21 DIAGNOSIS — K5730 Diverticulosis of large intestine without perforation or abscess without bleeding: Secondary | ICD-10-CM

## 2012-03-21 DIAGNOSIS — M1990 Unspecified osteoarthritis, unspecified site: Secondary | ICD-10-CM

## 2012-03-21 DIAGNOSIS — K5520 Angiodysplasia of colon without hemorrhage: Secondary | ICD-10-CM

## 2012-03-21 DIAGNOSIS — K219 Gastro-esophageal reflux disease without esophagitis: Secondary | ICD-10-CM

## 2012-03-21 DIAGNOSIS — M810 Age-related osteoporosis without current pathological fracture: Secondary | ICD-10-CM

## 2012-03-21 DIAGNOSIS — D62 Acute posthemorrhagic anemia: Secondary | ICD-10-CM

## 2012-03-21 MED ORDER — TRAMADOL HCL 50 MG PO TABS: ORAL_TABLET | ORAL | Status: DC

## 2012-03-21 NOTE — Patient Instructions (Addendum)
Today we updated your med list in our EPIC system...    Continue your current medications the same...  We wrote a new prescription for TRAMADOL 50mg  one tab up to 3 times daily w/ food as needed for pain...  Gradually increase your activity level...  Call for any questions...  Let's plan a follow up visit in 1 month w/ FASTING blood work at that time.Marland KitchenMarland Kitchen

## 2012-03-21 NOTE — Progress Notes (Signed)
Subjective:    Patient ID: Lisa Douglas, female    DOB: [birth year], 78 y.o.   MRN: [id number]  HPI 78 y/o WF here for a follow up visit... he has multiple medical problems as noted below...   ~  September 30, 2010:  she's had another good 182mo- no new complaints or concerns... she saw [name] 1/12 for f/u chr AFib, unable to take Coumadin due to AVMs, on daily ASA/ Metoprolol/ Cardizem, doing well- no changes made...  she had BMD done by GYN, [name]- TScore -2.5 spine & -2.4 left FemNeck (she has refused meds & he didn't change management)...     Today she requests CXR "I wheeze" although exam is clear at present (CXR showed min basilar scarring, otherw neg);  still followed at [facility] clinic for macular degen but she states vision is fine;  Lipids stable on Simva20;  GI followed by [name] & [place];  mod DJD but active at home she says...  ~  April 01, 2011:  182mo ROV & she continues stable w/o new complaints or concerns...    She remains in AFib w/ rate control strategy on ASA, Metoprolol, Diltiazem;  BP= 120/72 & pulse= 74;  She denies CP, palpit, dizzy, edema, etc; she notes DOE w/o change from baseline; she knows to elim sodium, elev legs, wear support hose if needed...    Lipids controlled on Simva20 + diet & FLP today looks good; chemistries are similarly wnl...    GI is stable on Prilosec, stool softeners, etc...  ~  October 05, 2011:  182mo ROV & she is stable w/ mult chronic complaints> notes gas, belching, occas nausea on & off x yrs; on Prilosec 20mg /d, "nothing bothering me now"; also loose stool x1 that was yellow, now gone & hasn't recurred; we discussed taking Align & Activia...  We reviewed her meds & full labs done 8/12>>    Cardiac stable w/ chr AFib on ASA, Cardizem & Metoprolol> BP= 126/74 & feels well w/o HA, visual symptoms, CP, palpit, ch in SOB, edema, etc; not on Coumadin due to [place] & GIB; she sees [name] once a yr & she's been stable...    We reviewed  her Osteoporosis, prev followed by [name] but she doesn't think she needs any further GYN checks; she has repeatedly refused any & all osteoporosis rx; again rec that she consider Alendronate or Atelvia orally or Reclast by infusion but she declines...    We reviewed meds & prev labs; she requests Rx for Shingles vaccine and refill Alprazolam...  ~  March 13,2013:  14mo ROV & add-on for Acute URI symptoms> c/o 4-5d hx "cold" w/ sneezing, head congestion, sinus draining, then cough, low grade temp, clear phlegm, sore from the coughing & can't rest; she has tried OTC Tylenol & nasal saline but no relief; exam shows T99.4, red inflammed pharynx, nasal congestion & drainage, chest rhonchi & mild exp wheezing; we discussed treatment w/ Rest, Tylenol, MucinexDM, Fluids, and ZPak/ Depo/ Pred taper/ & Tussionex...    Other problems including her AFib, Chol, Gerd, DJD, etc are essentially stable- continue same meds...  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    She is requesting a pain piull for her c/o LBP & we agreed to Tramadol prn; her energy is basically pretty good- "I get tired" she says; BP= 112/62 on Metoprolol25mg -3Bid (75mg Bid) and Diltiazem240mg /d; she remains in AFib w/ controlled rate on these meds; other meds as listed remain the same...    We  reviewed prob list, meds, xrays and labs> see below for updates >>   PROBLEM LIST:    MACULAR DEGENERATION (ICD-362.50) - she's followed in the [facility] clinic Q16mo; on Eye Vits and she states she may need shots in her left eye per [name]; she also reports cataracts which may require surg...  ATRIAL FIBRILLATION, CHRONIC (ICD-427.31) - on ASA 81mg /d; rate control w/ METOPROLOL 25mg -3Bid & DILTIAZEM 240mg /d; now on Coumadin since 7/13 Hosp for SMA embolus & surg (prev not felt to be a Coumadin candidate due to colonoc AVMs); note- she had intra-abd hematoma requiring a second operation/ ELap during the 7/13 Hosp... ~  Cards f/u by [name] yearly- doing well, no changes, made... ~  EKG 3/13 showed Fib/flutter w/ variable conduction & NSSTTWA... ~  [facility] 7/13 for Abd pain, SMA embolectomy, started on anticoagulation, subseq re-op/ ELap for intra-abd hematoma; AFib meds adjusted=> Metoprolol75mg Bid & Diltiazen240mg /d...  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  VENOUS INSUFFICIENCY (ICD-459.81) - she follows a low sodium diet... not on diuretic therapy.  HYPERLIPIDEMIA (ICD-272.4) - on SIMVASTATIN 20mg /d... ~  FLP was 6/08 showing TChol 135, TG 90, HDL 56, LDL 61... ~  FLP 2/09 showed TChol 140, TG 99, HDL 59, LDL 62... great! ~  FLP 2/10  on Vytor10/20 showed TChol 133, TG 83, HDL 56, LDL 60... rec> change to Simva20. ~  FLP 8/10 on Simva20 showed TChol 143, TG 99, HDL 51, LDL 73 ~  FLP 8/11 on Simva20 showed TChol 147, TG 103, HDL 54, LDL 72 ~  FLP 8/12 on Simva20 showed TChol 142, TG 106, HDL 63, LDL 58  GERD (ICD-530.81) - she takes PRILOSEC 20mg /d and remains asymptomatic...  DIVERTICULOSIS OF COLON (ICD-562.10) - no abd pain or episodes of diverticulitis; she uses stool softeners for constipation... ANGIODYSPLASIA, COLON (ZOX-096.04) - last colonoscopy was 10/05 by [name] showing divertics and colon AVMs...  DEGENERATIVE JOINT DISEASE (ICD-715.90) - only c/o some hand pain... uses OTC meds as needed... her main exercise is "up and down the steps 40-11 times  a day"...  OSTEOPOROSIS (ICD-733.00) - prev took Actonel but stopped on her own 01-23-09...  takes Ca++, MVI, VitD OTC... her last BMD was 3/08 by [name] for GYN and he follows this (she gets BMD and Mammograms at his office)... she states INTOL to Alendronate "it made me sick" & refuses to restart meds for her bones (either pills, shots, or IV Rx)... ~  labs 2/10 showed Vit D level = 29... rec> add Vit D 1000 u OTC daily. ~  labs 8/10 showed Vit D level = 31... rec> continue 1000 u daily. ~  BMD 10/11 by [name] showed TScores -2.5 in Spine, and -2.4 in left FemNeck... ~  2/12:  Reminded of the importance of bisphos or other therapy for osteoporosis but she continues to decline treatment... ~  2/13:  Asked to reconsider treatment for her osteoporosis but she declines; asked her to discuss w/ her GYN (they do her BMDs)...  ANXIETY (ICD-300.00) - she has ALPRAZOLAM 0.5mg  - 1/2 to 1 tab Tid Prn... ~  Stress level decr since [name] passed away 01/23/2009 & caregiver roll is diminished...   Past Surgical History  Procedure Date  . Appendectomy 1951  . Tonsillectomy 1943  . Laparotomy 03/07/2012    Procedure: EXPLORATORY LAPAROTOMY;  Surgeon: Chuck Hint, MD;   Location: [facility] OR;  Service: Vascular;  Laterality: N/A;  Exploratory Laparotomy with Evacuation of Hematoma.    Outpatient Encounter Prescriptions as of 03/21/2012  Medication Sig Dispense Refill  . ALPRAZolam (XANAX) 0.5 MG tablet Take 0.25 mg by mouth 3 (three) times daily as needed. For anxiety      . Calcium Carbonate-Vitamin D (CALTRATE 600+D) 600-400 MG-UNIT per tablet Take 1 tablet by mouth 2 (two) times daily.        . cholecalciferol (VITAMIN D) 1000 UNITS tablet Take 2,000 Units by mouth daily.       Marland Kitchen diltiazem (CARDIZEM CD) 240 MG 24 hr capsule Take 1 capsule (240 mg total) by mouth daily.  30 capsule  0  . metoprolol tartrate (LOPRESSOR) 25 MG tablet Take 3 tablets (75 mg total) by mouth 2 (two) times daily.  60 tablet  0  . Multiple Vitamins-Minerals (CENTRUM SILVER PO) Take 1 tablet by mouth daily.        . Multiple Vitamins-Minerals (EYE VITAMINS) TABS Take 1 tablet by mouth daily.        Marland Kitchen omeprazole (PRILOSEC OTC) 20 MG tablet Take 20 mg by mouth daily.        . potassium chloride (K-DUR) 10 MEQ tablet Take 1 tablet (10 mEq total) by mouth 2 (two) times daily.  30 tablet  0  . simvastatin (ZOCOR) 20 MG tablet TAKE ONE TABLET BY MOUTH AT BEDTIME  30 tablet  5  . warfarin (COUMADIN) 3 MG tablet Take 3 mg by mouth as directed.      Marland Kitchen DISCONTD: KLOR-CON M10 10 MEQ tablet Take 1 tablet by mouth Twice daily.      Marland Kitchen DISCONTD: warfarin (COUMADIN) 3 MG tablet Take 1 tablet (3 mg total) by mouth daily.  15 tablet  0  . DISCONTD: metoprolol succinate (TOPROL-XL) 25 MG 24 hr tablet Take 3 tablets by mouth Twice daily.        Allergies  Allergen Reactions  . Penicillins     angioedema  . Alendronate Sodium     REACTION: INTOL to Fosamax per pt.  . Atorvastatin     REACTION: INTOL to Lipitor per pt.  Current Medications, Allergies, Past Medical History, Past Surgical History, Family History, and Social History were reviewed in [employer] record.    Review  of Systems    See HPI - all other systems neg except as noted...  The patient complains of dyspnea on exertion.  The patient denies anorexia, fever, weight loss, weight gain, vision loss, decreased hearing, hoarseness, chest pain, syncope, peripheral edema, prolonged cough, headaches, hemoptysis, abdominal pain, melena, hematochezia, severe indigestion/heartburn, hematuria, incontinence, muscle weakness, suspicious skin lesions, transient blindness, difficulty walking, depression, unusual weight change, abnormal bleeding, enlarged lymph nodes, and angioedema.    Objective:   Physical Exam     WD, WN, 78 y/o WF in NAD... GENERAL:  Alert & oriented; pleasant & cooperative... HEENT:  [place]/AT, EOM-wnl, PERRLA, EACs-clear, TMs-wnl, NOSE- red, congested, THROAT- red/ inflammed, mucoid drainage. NECK:  Supple w/ fairROM; no JVD; normal carotid impulses w/o bruits; no thyromegaly or nodules palpated; no lymphadenopathy. CHEST:  Rhonchi R>L w/ some exp wheezing; no rales or signs of consolidation... HEART:  irreg w/ more rapid response, without murmurs/ rubs/ or gallops heard... ABDOMEN:  Soft & nontender; normal bowel sounds; no organomegaly or masses detected. EXT: without deformities, mild arthritic changes; no varicose veins/ +venous insuffic/ no edema. NEURO:  CN's intact; no focal neuro deficits... DERM:  No lesions noted; no rash etc...  RADIOLOGY DATA:  Reviewed in the EPIC EMR & discussed w/ the patient...  LABORATORY DATA:  Reviewed in the EPIC EMR & discussed w/ the patient...   Assessment & Plan:   S/P Hosp 7/13 for Abd pain found to be due to SMA embolus=> s/p embolectomy etc; subseq intra-abd hematoma ELap surg, etc... Now much improved & stable on Coumadin via CC...   Atrial Fibrillation>  On Coumadin, Metoprolol, Diltiazem; rate control strategy w/ improved parameters...  Ven Insuffic>  Doing satis w/o signif edema...  Hyperlipid> stable on Simva20 w/ excellent FLP...  GI>  GERD, Divertics, AVMs>  Followed by [name] & stable on Prilosec & stool softeners...  DJD>  She uses OTC analgesics prn...  Osteoporosis>  BMDs followed by her GYN [name]; she's refused Bisphos Rx- on calcium, MVI, Vit D...  Anxiety>  On Alprazolam 0.5mg  prn...  MACULAR DEGEN, Cataracts>  Followed by [facility] in [place] clinic...   Patient's Medications  New Prescriptions   TRAMADOL (ULTRAM) 50 MG TABLET    Take 1 tablet by mouth three times daily with food as needed for pain  Previous Medications   ALPRAZOLAM (XANAX) 0.5 MG TABLET    Take 0.25 mg by mouth 3 (three) times daily as needed. For anxiety   CALCIUM CARBONATE-VITAMIN D (CALTRATE 600+D) 600-400 MG-UNIT PER TABLET    Take 1 tablet by mouth 2 (two) times daily.     CHOLECALCIFEROL (VITAMIN D) 1000 UNITS TABLET    Take 2,000 Units by mouth daily.    DILTIAZEM (CARDIZEM CD) 240 MG 24 HR CAPSULE    Take 1 capsule (240 mg total) by mouth daily.   METOPROLOL TARTRATE (LOPRESSOR) 25 MG TABLET    Take 3 tablets (75 mg total) by mouth 2 (two) times daily.   MULTIPLE VITAMINS-MINERALS (CENTRUM SILVER PO)    Take 1 tablet by mouth daily.     MULTIPLE VITAMINS-MINERALS (EYE VITAMINS) TABS    Take 1 tablet by mouth daily.     OMEPRAZOLE (PRILOSEC OTC) 20 MG TABLET    Take 20 mg by mouth daily.     POTASSIUM CHLORIDE (K-DUR) 10 MEQ TABLET  Take 1 tablet (10 mEq total) by mouth 2 (two) times daily.   SIMVASTATIN (ZOCOR) 20 MG TABLET    TAKE ONE TABLET BY MOUTH AT BEDTIME  Modified Medications   Modified Medication Previous Medication   WARFARIN (COUMADIN) 3 MG TABLET warfarin (COUMADIN) 3 MG tablet      Take 3 mg by mouth as directed.    Take 1 tablet (3 mg total) by mouth daily.  Discontinued Medications   KLOR-CON M10 10 MEQ TABLET    Take 1 tablet by mouth Twice daily.   METOPROLOL SUCCINATE (TOPROL-XL) 25 MG 24 HR TABLET    Take 3 tablets by mouth Twice daily.

## 2012-03-22 ENCOUNTER — Other Ambulatory Visit (HOSPITAL_COMMUNITY): Payer: Self-pay | Admitting: Internal Medicine

## 2012-03-24 ENCOUNTER — Ambulatory Visit (INDEPENDENT_AMBULATORY_CARE_PROVIDER_SITE_OTHER): Payer: Medicare Other | Admitting: Pharmacist

## 2012-03-24 DIAGNOSIS — R58 Hemorrhage, not elsewhere classified: Secondary | ICD-10-CM

## 2012-03-24 DIAGNOSIS — K55059 Acute (reversible) ischemia of intestine, part and extent unspecified: Secondary | ICD-10-CM

## 2012-03-24 DIAGNOSIS — I4891 Unspecified atrial fibrillation: Secondary | ICD-10-CM

## 2012-03-24 DIAGNOSIS — Z7901 Long term (current) use of anticoagulants: Secondary | ICD-10-CM | POA: Diagnosis not present

## 2012-03-24 DIAGNOSIS — K55069 Acute infarction of intestine, part and extent unspecified: Secondary | ICD-10-CM

## 2012-03-24 DIAGNOSIS — K661 Hemoperitoneum: Secondary | ICD-10-CM

## 2012-03-24 LAB — POCT INR: INR: 4.6

## 2012-03-29 ENCOUNTER — Telehealth: Payer: Self-pay | Admitting: Pulmonary Disease

## 2012-03-29 MED ORDER — METOPROLOL TARTRATE 25 MG PO TABS: ORAL_TABLET | ORAL | Status: DC

## 2012-03-29 NOTE — Telephone Encounter (Signed)
I spoke with pt and she states when she left the hospital back in July they changed her metoprolol to 25 mg 3 po BID. She thinks this is too much. She is needing an rx sent to [facility] but is wanting to know if this can be cut down. Please advise Dr. [name], thanks

## 2012-03-29 NOTE — Telephone Encounter (Signed)
Per SN---ok to change the metoprolol to 2 tabs po bid and a new rx has been sent to the pts pharmacy for this.  Nothing further is needed.

## 2012-03-30 ENCOUNTER — Telehealth: Payer: Self-pay | Admitting: Pulmonary Disease

## 2012-03-30 NOTE — Telephone Encounter (Signed)
Pt was told to call her PCP for refills on the Coumadin but does have enough until her check at the clinic tomorrow. She will let them know she will need this sent to her pharmacy and verbalized understanding.

## 2012-03-30 NOTE — Telephone Encounter (Signed)
Pt states she was started on Coumadin when in the hospital back in July 2013. She is needing this medication refilled and will be seen at the Coumadin clinic tomorrow. SN, pls advise on refills.

## 2012-03-30 NOTE — Telephone Encounter (Signed)
Does she have enough of the med to last till her appt tomorrow?   Coumadin clinic will be following her for her checks and they should be filling this med for her.  thanks

## 2012-03-31 ENCOUNTER — Ambulatory Visit (INDEPENDENT_AMBULATORY_CARE_PROVIDER_SITE_OTHER): Payer: Medicare Other | Admitting: Pharmacist

## 2012-03-31 DIAGNOSIS — K55059 Acute (reversible) ischemia of intestine, part and extent unspecified: Secondary | ICD-10-CM | POA: Diagnosis not present

## 2012-03-31 DIAGNOSIS — Z7901 Long term (current) use of anticoagulants: Secondary | ICD-10-CM

## 2012-03-31 DIAGNOSIS — R58 Hemorrhage, not elsewhere classified: Secondary | ICD-10-CM | POA: Diagnosis not present

## 2012-03-31 DIAGNOSIS — K55069 Acute infarction of intestine, part and extent unspecified: Secondary | ICD-10-CM

## 2012-03-31 DIAGNOSIS — K661 Hemoperitoneum: Secondary | ICD-10-CM

## 2012-03-31 DIAGNOSIS — I4891 Unspecified atrial fibrillation: Secondary | ICD-10-CM | POA: Diagnosis not present

## 2012-03-31 MED ORDER — WARFARIN SODIUM 3 MG PO TABS: ORAL_TABLET | ORAL | Status: DC

## 2012-04-04 ENCOUNTER — Encounter: Payer: Self-pay | Admitting: Vascular Surgery

## 2012-04-05 ENCOUNTER — Encounter: Payer: Self-pay | Admitting: Vascular Surgery

## 2012-04-05 ENCOUNTER — Ambulatory Visit: Payer: Medicare Other | Admitting: Pulmonary Disease

## 2012-04-05 ENCOUNTER — Ambulatory Visit (INDEPENDENT_AMBULATORY_CARE_PROVIDER_SITE_OTHER): Payer: Medicare Other | Admitting: Vascular Surgery

## 2012-04-05 VITALS — BP 116/72 | HR 90 | Resp 18 | Ht 65.5 in | Wt 143.0 lb

## 2012-04-05 DIAGNOSIS — S3690XA Unspecified injury of unspecified intra-abdominal organ, initial encounter: Secondary | ICD-10-CM

## 2012-04-05 NOTE — Progress Notes (Signed)
[facility]ist [facility]  Patient name: Lisa Douglas MRN: [id number] DOB: [birth year] Sex: female  REASON FOR VISIT: follow up after SMA embolectomy.  HPI: Lisa Douglas is a 78 y.o. female who presented with a sudden onset of acute abdominal pain and CT scan showed an SMA embolus. She underwent SMA embolectomy on 03/03/2012. She had to be taken back to the R. On 03/07/2012 after a CT scan had been obtained because of low hemoglobin and was interpreted as showing evidence of active bleeding. Of note no active bleeding was found the time of surgery. After that she did well and ultimately went home. She comes in for her first outpatient visit. She has been eating and has had no postprandial abdominal pain. She is gradually resuming her normal activities. She's had no nausea or vomiting.   REVIEW OF SYSTEMS: Arly.Keller ] denotes positive finding; [  ] denotes negative finding  CARDIOVASCULAR:  [ ]  chest pain   [ ]  dyspnea on exertion    CONSTITUTIONAL:  [ ]  fever   [ ]  chills  PHYSICAL EXAM: Filed Vitals:   04/05/12 1607  BP: 116/72  Pulse: 90  Resp: 18  Height: 5' 5.5" (1.664 m)  Weight: 143 lb (64.864 kg)   Body mass index is 23.43 kg/(m^2). GENERAL: The patient is a well-nourished female, in no acute distress. The vital signs are documented above. CARDIOVASCULAR: There is a regular rate and rhythm  PULMONARY: There is good air exchange bilaterally without wheezing or rales. Abdomen is soft and nontender with normal pitched bowel sounds. Her incision is healing nicely.  MEDICAL ISSUES: Overall pleased with her progress. I've instructed her not to do any heavy lifting for 6 months. She is on Coumadin and will need to remain on this to prevent further embolic events. I'll see her back as needed.  Lisa Douglas S [facility] [place] Beeper: [phone]

## 2012-04-07 ENCOUNTER — Ambulatory Visit (INDEPENDENT_AMBULATORY_CARE_PROVIDER_SITE_OTHER): Payer: Medicare Other | Admitting: Pharmacist

## 2012-04-07 DIAGNOSIS — Z7901 Long term (current) use of anticoagulants: Secondary | ICD-10-CM

## 2012-04-07 DIAGNOSIS — R58 Hemorrhage, not elsewhere classified: Secondary | ICD-10-CM | POA: Diagnosis not present

## 2012-04-07 DIAGNOSIS — I4891 Unspecified atrial fibrillation: Secondary | ICD-10-CM | POA: Diagnosis not present

## 2012-04-07 DIAGNOSIS — K55059 Acute (reversible) ischemia of intestine, part and extent unspecified: Secondary | ICD-10-CM | POA: Diagnosis not present

## 2012-04-07 DIAGNOSIS — K661 Hemoperitoneum: Secondary | ICD-10-CM

## 2012-04-07 DIAGNOSIS — K55069 Acute infarction of intestine, part and extent unspecified: Secondary | ICD-10-CM

## 2012-04-13 ENCOUNTER — Telehealth: Payer: Self-pay | Admitting: Pulmonary Disease

## 2012-04-13 MED ORDER — DILTIAZEM HCL ER COATED BEADS 240 MG PO CP24: 240.0000 mg | ORAL_CAPSULE | Freq: Every day | ORAL | Status: DC

## 2012-04-13 NOTE — Telephone Encounter (Signed)
Pt said that she does not have f/u with cardiology and that she was told to let her primary md do her refills now.  Refill for diltiazem sent to pharmacy.  Pt aware.

## 2012-04-14 ENCOUNTER — Ambulatory Visit (INDEPENDENT_AMBULATORY_CARE_PROVIDER_SITE_OTHER): Payer: Medicare Other | Admitting: *Deleted

## 2012-04-14 DIAGNOSIS — K661 Hemoperitoneum: Secondary | ICD-10-CM

## 2012-04-14 DIAGNOSIS — Z7901 Long term (current) use of anticoagulants: Secondary | ICD-10-CM | POA: Diagnosis not present

## 2012-04-14 DIAGNOSIS — K55059 Acute (reversible) ischemia of intestine, part and extent unspecified: Secondary | ICD-10-CM | POA: Diagnosis not present

## 2012-04-14 DIAGNOSIS — K55069 Acute infarction of intestine, part and extent unspecified: Secondary | ICD-10-CM

## 2012-04-14 DIAGNOSIS — R58 Hemorrhage, not elsewhere classified: Secondary | ICD-10-CM | POA: Diagnosis not present

## 2012-04-14 DIAGNOSIS — I4891 Unspecified atrial fibrillation: Secondary | ICD-10-CM | POA: Diagnosis not present

## 2012-04-14 LAB — POCT INR: INR: 3

## 2012-04-17 ENCOUNTER — Encounter (HOSPITAL_COMMUNITY): Payer: Self-pay | Admitting: *Deleted

## 2012-04-17 ENCOUNTER — Emergency Department (HOSPITAL_COMMUNITY)
Admission: EM | Admit: 2012-04-17 | Discharge: 2012-04-17 | Disposition: A | Discharge status: Home / Self Care | Payer: Medicare Other | Attending: Emergency Medicine | Admitting: Emergency Medicine

## 2012-04-17 DIAGNOSIS — M1990 Unspecified osteoarthritis, unspecified site: Secondary | ICD-10-CM | POA: Diagnosis not present

## 2012-04-17 DIAGNOSIS — R21 Rash and other nonspecific skin eruption: Secondary | ICD-10-CM | POA: Diagnosis not present

## 2012-04-17 DIAGNOSIS — I4891 Unspecified atrial fibrillation: Secondary | ICD-10-CM | POA: Insufficient documentation

## 2012-04-17 DIAGNOSIS — K219 Gastro-esophageal reflux disease without esophagitis: Secondary | ICD-10-CM | POA: Diagnosis not present

## 2012-04-17 DIAGNOSIS — Z87891 Personal history of nicotine dependence: Secondary | ICD-10-CM | POA: Insufficient documentation

## 2012-04-17 DIAGNOSIS — M810 Age-related osteoporosis without current pathological fracture: Secondary | ICD-10-CM | POA: Insufficient documentation

## 2012-04-17 DIAGNOSIS — Z7901 Long term (current) use of anticoagulants: Secondary | ICD-10-CM | POA: Insufficient documentation

## 2012-04-17 DIAGNOSIS — E785 Hyperlipidemia, unspecified: Secondary | ICD-10-CM | POA: Insufficient documentation

## 2012-04-17 DIAGNOSIS — Z79899 Other long term (current) drug therapy: Secondary | ICD-10-CM | POA: Insufficient documentation

## 2012-04-17 HISTORY — DX: Acute (reversible) ischemia of intestine, part and extent unspecified: K55.059

## 2012-04-17 LAB — APTT: aPTT: 34 seconds (ref 24–37)

## 2012-04-17 LAB — CBC WITH DIFFERENTIAL/PLATELET
Basophils Absolute: 0 10*3/uL (ref 0.0–0.1)
Basophils Relative: 0 % (ref 0–1)
Eosinophils Absolute: 0.1 10*3/uL (ref 0.0–0.7)
Eosinophils Relative: 2 % (ref 0–5)
HCT: 39.1 % (ref 36.0–46.0)
MCH: 29.6 pg (ref 26.0–34.0)
MCHC: 32.7 g/dL (ref 30.0–36.0)
MCV: 90.3 fL (ref 78.0–100.0)
Monocytes Absolute: 0.7 10*3/uL (ref 0.1–1.0)
RDW: 14.7 % (ref 11.5–15.5)

## 2012-04-17 LAB — PROTIME-INR
INR: 2.11 — ABNORMAL HIGH (ref 0.00–1.49)
Prothrombin Time: 24 seconds — ABNORMAL HIGH (ref 11.6–15.2)

## 2012-04-17 LAB — COMPREHENSIVE METABOLIC PANEL
AST: 26 U/L (ref 0–37)
Albumin: 3.7 g/dL (ref 3.5–5.2)
Calcium: 9.5 mg/dL (ref 8.4–10.5)
Creatinine, Ser: 0.8 mg/dL (ref 0.50–1.10)
GFR calc non Af Amer: 67 mL/min — ABNORMAL LOW (ref >90)

## 2012-04-17 MED ORDER — PREDNISONE 10 MG PO TABS: 20.0000 mg | ORAL_TABLET | Freq: Every day | ORAL | Status: DC

## 2012-04-17 MED ORDER — PREDNISONE 20 MG PO TABS
40.0000 mg | ORAL_TABLET | Freq: Once | ORAL | Status: AC
  Administered 2012-04-17: 40 mg via ORAL

## 2012-04-17 MED ORDER — PREDNISONE 20 MG PO TABS
ORAL_TABLET | ORAL | Status: AC
  Filled 2012-04-17: qty 2

## 2012-04-17 NOTE — ED Notes (Signed)
Patient denies pain and is resting comfortably.  

## 2012-04-17 NOTE — ED Notes (Signed)
Per daughter pt c/o rash bilateral lower legs x 1 wk; red in appearance; does not itch or hurt; family concerned due to previous history of mesenteric clot in July and pt starting on coumadin

## 2012-04-17 NOTE — ED Notes (Signed)
MD at bedside. 

## 2012-04-17 NOTE — ED Provider Notes (Signed)
History     CSN: [id number]  Arrival date & time 04/17/12  1942   First MD Initiated Contact with Patient 04/17/12 2018      Chief Complaint  Patient presents with  . Rash    (Consider location/radiation/quality/duration/timing/severity/associated sxs/prior treatment) Patient is a 78 y.o. female presenting with rash. The history is provided by the patient.  Rash    issue here with bilateral lower extremity rash x1 week. Denies any pain or pruritus. No new medications used. No fever or excision upper legs. No leg swelling. No prior history of same. No medications used for this prior to arrival. Nothing makes her symptoms better or worse  Past Medical History  Diagnosis Date  . Macular degeneration (senile) of retina, unspecified   . Atrial fibrillation   . Unspecified venous (peripheral) insufficiency   . Other and unspecified hyperlipidemia   . Esophageal reflux   . Diverticulosis of colon (without mention of hemorrhage)   . Angiodysplasia of intestine (without mention of hemorrhage)   . Osteoarthrosis, unspecified whether generalized or localized, unspecified site   . Osteoporosis, unspecified   . Anxiety state, unspecified   . Mesenteric embolus     Past Surgical History  Procedure Date  . Appendectomy 1951  . Tonsillectomy 1943  . Laparotomy 03/07/2012    Procedure: EXPLORATORY LAPAROTOMY;  Surgeon: Chuck Hint, MD;  [facility] OR;  Service: Vascular;  Laterality: N/A;  Exploratory Laparotomy with Evacuation of Hematoma.    Family History  Problem Relation Age of Onset  . Heart disease Sister   . Breast cancer Sister   . Lymphoma Sister   . Hemochromatosis Father     History  Substance Use Topics  . Smoking status: Former Smoker    Types: Cigarettes    Quit date: 08/24/1951  . Smokeless tobacco: Never Used   Comment: smoked at age 71 for a short time--a few months  . Alcohol Use: No    OB History    Grav Para Term Preterm Abortions TAB SAB  Ect Mult Living                  Review of Systems  Skin: Positive for rash.  All other systems reviewed and are negative.    Allergies  Penicillins; Alendronate sodium; Atorvastatin; and Morphine and related  Home Medications   Current Outpatient Rx  Name Route Sig Dispense Refill  . ALPRAZOLAM 0.5 MG PO TABS Oral Take 0.25 mg by mouth 3 (three) times daily as needed. For anxiety    . CALCIUM CARBONATE-VITAMIN D 600-400 MG-UNIT PO TABS Oral Take 1 tablet by mouth daily.     Marland Kitchen VITAMIN D 1000 UNITS PO TABS Oral Take 1,000 Units by mouth daily.     Marland Kitchen DILTIAZEM HCL ER COATED BEADS 240 MG PO CP24 Oral Take 1 capsule (240 mg total) by mouth daily. 30 capsule 6  . METOPROLOL TARTRATE 25 MG PO TABS Oral Take 50 mg by mouth 2 (two) times daily.    Marland Kitchen EYE VITAMINS PO TABS Oral Take 1 tablet by mouth daily.      Marland Kitchen OMEPRAZOLE MAGNESIUM 20 MG PO TBEC Oral Take 20 mg by mouth daily.      Marland Kitchen SIMVASTATIN 20 MG PO TABS  TAKE ONE TABLET BY MOUTH AT BEDTIME 30 tablet 5  . WARFARIN SODIUM 3 MG PO TABS Oral Take 1.5 mg by mouth daily. 0.5 tab daily.      BP 126/59  Pulse 77  Temp  97.7 F (36.5 C)  Resp 20  SpO2 96%  Physical Exam  Nursing note and vitals reviewed. Constitutional: She is oriented to person, place, and time. Vital signs are normal. She appears well-developed and well-nourished.  Non-toxic appearance. No distress.  HENT:  Head: Normocephalic and atraumatic.  Eyes: Conjunctivae, EOM and lids are normal. Pupils are equal, round, and reactive to light.  Neck: Normal range of motion. Neck supple. No tracheal deviation present. No mass present.  Cardiovascular: Normal rate, regular rhythm and normal heart sounds.  Exam reveals no gallop.   No murmur heard. Pulmonary/Chest: Effort normal and breath sounds normal. No stridor. No respiratory distress. She has no decreased breath sounds. She has no wheezes. She has no rhonchi. She has no rales.  Abdominal: Soft. Normal appearance and  bowel sounds are normal. She exhibits no distension. There is no tenderness. There is no rebound and no CVA tenderness.  Musculoskeletal: Normal range of motion. She exhibits no edema and no tenderness.       Bilateral lower extremity rash characterized as petechial in some areas and macular and others. No purpura. No lymphangitis. Not warm to the touch. Extremities not edematous. Eczematous-like rash in areas  Neurological: She is alert and oriented to person, place, and time. She has normal strength. No cranial nerve deficit or sensory deficit. GCS eye subscore is 4. GCS verbal subscore is 5. GCS motor subscore is 6.  Skin: Skin is warm and dry. Petechiae and rash noted. No abrasion noted. Rash is macular. Rash is not nodular, not pustular, not vesicular and not urticarial.  Psychiatric: She has a normal mood and affect. Her speech is normal and behavior is normal.    ED Course  Procedures (including critical care time)   Labs Reviewed  CBC WITH DIFFERENTIAL  COMPREHENSIVE METABOLIC PANEL  PROTIME-INR  APTT   No results found.   No diagnosis found.    MDM  Patient without signs of cellulitis or concern for DVT. Suspect patient is having some type of contact dermatitis. Patient given prednisone and she will see her Dr. later this week        Toy Baker, MD 04/17/12 2138

## 2012-04-21 ENCOUNTER — Ambulatory Visit (INDEPENDENT_AMBULATORY_CARE_PROVIDER_SITE_OTHER): Payer: Medicare Other | Admitting: Pharmacist

## 2012-04-21 DIAGNOSIS — K661 Hemoperitoneum: Secondary | ICD-10-CM

## 2012-04-21 DIAGNOSIS — I4891 Unspecified atrial fibrillation: Secondary | ICD-10-CM | POA: Diagnosis not present

## 2012-04-21 DIAGNOSIS — K55069 Acute infarction of intestine, part and extent unspecified: Secondary | ICD-10-CM

## 2012-04-21 DIAGNOSIS — K55059 Acute (reversible) ischemia of intestine, part and extent unspecified: Secondary | ICD-10-CM

## 2012-04-21 DIAGNOSIS — Z7901 Long term (current) use of anticoagulants: Secondary | ICD-10-CM

## 2012-04-21 DIAGNOSIS — R58 Hemorrhage, not elsewhere classified: Secondary | ICD-10-CM

## 2012-04-26 ENCOUNTER — Other Ambulatory Visit (INDEPENDENT_AMBULATORY_CARE_PROVIDER_SITE_OTHER): Payer: Medicare Other

## 2012-04-26 ENCOUNTER — Ambulatory Visit (INDEPENDENT_AMBULATORY_CARE_PROVIDER_SITE_OTHER): Payer: Medicare Other | Admitting: Pulmonary Disease

## 2012-04-26 ENCOUNTER — Encounter: Payer: Self-pay | Admitting: Pulmonary Disease

## 2012-04-26 VITALS — BP 122/70 | HR 83 | Temp 96.8°F | Ht 65.5 in | Wt 143.0 lb

## 2012-04-26 DIAGNOSIS — K55059 Acute (reversible) ischemia of intestine, part and extent unspecified: Secondary | ICD-10-CM | POA: Diagnosis not present

## 2012-04-26 DIAGNOSIS — D62 Acute posthemorrhagic anemia: Secondary | ICD-10-CM

## 2012-04-26 DIAGNOSIS — K219 Gastro-esophageal reflux disease without esophagitis: Secondary | ICD-10-CM

## 2012-04-26 DIAGNOSIS — E785 Hyperlipidemia, unspecified: Secondary | ICD-10-CM | POA: Diagnosis not present

## 2012-04-26 DIAGNOSIS — K55069 Acute infarction of intestine, part and extent unspecified: Secondary | ICD-10-CM

## 2012-04-26 DIAGNOSIS — K5730 Diverticulosis of large intestine without perforation or abscess without bleeding: Secondary | ICD-10-CM

## 2012-04-26 DIAGNOSIS — I872 Venous insufficiency (chronic) (peripheral): Secondary | ICD-10-CM

## 2012-04-26 DIAGNOSIS — F411 Generalized anxiety disorder: Secondary | ICD-10-CM

## 2012-04-26 DIAGNOSIS — M1990 Unspecified osteoarthritis, unspecified site: Secondary | ICD-10-CM

## 2012-04-26 DIAGNOSIS — M810 Age-related osteoporosis without current pathological fracture: Secondary | ICD-10-CM

## 2012-04-26 DIAGNOSIS — I4891 Unspecified atrial fibrillation: Secondary | ICD-10-CM | POA: Diagnosis not present

## 2012-04-26 LAB — HEPATIC FUNCTION PANEL
ALT: 22 U/L (ref 0–35)
Albumin: 3.9 g/dL (ref 3.5–5.2)
Total Bilirubin: 0.6 mg/dL (ref 0.3–1.2)

## 2012-04-26 LAB — CBC WITH DIFFERENTIAL/PLATELET
Basophils Relative: 0.5 % (ref 0.0–3.0)
Eosinophils Relative: 1.1 % (ref 0.0–5.0)
HCT: 41.8 % (ref 36.0–46.0)
MCV: 91.1 fl (ref 78.0–100.0)
Monocytes Absolute: 0.7 10*3/uL (ref 0.1–1.0)
Monocytes Relative: 9.1 % (ref 3.0–12.0)
Neutrophils Relative %: 61.2 % (ref 43.0–77.0)
RBC: 4.59 Mil/uL (ref 3.87–5.11)
WBC: 7.4 10*3/uL (ref 4.5–10.5)

## 2012-04-26 LAB — IBC PANEL
Iron: 53 ug/dL (ref 42–145)
Saturation Ratios: 15.1 % — ABNORMAL LOW (ref 20.0–50.0)

## 2012-04-26 LAB — BASIC METABOLIC PANEL
BUN: 13 mg/dL (ref 6–23)
Creatinine, Ser: 0.8 mg/dL (ref 0.4–1.2)
GFR: 71.92 mL/min (ref >60.00)
Glucose, Bld: 105 mg/dL — ABNORMAL HIGH (ref 70–99)

## 2012-04-26 LAB — LIPID PANEL
Cholesterol: 155 mg/dL (ref 0–200)
LDL Cholesterol: 72 mg/dL (ref 0–99)
Triglycerides: 142 mg/dL (ref 0.0–149.0)
VLDL: 28.4 mg/dL (ref 0.0–40.0)

## 2012-04-26 LAB — TSH: TSH: 2.31 u[IU]/mL (ref 0.35–5.50)

## 2012-04-26 MED ORDER — DILTIAZEM HCL ER 180 MG PO CP24: 180.0000 mg | ORAL_CAPSULE | Freq: Every day | ORAL | Status: DC

## 2012-04-26 MED ORDER — FLUOCINONIDE-E 0.05 % EX CREA: TOPICAL_CREAM | Freq: Two times a day (BID) | CUTANEOUS | Status: DC

## 2012-04-26 MED ORDER — METOPROLOL SUCCINATE ER 100 MG PO TB24: 100.0000 mg | ORAL_TABLET | Freq: Every day | ORAL | Status: DC

## 2012-04-26 NOTE — Progress Notes (Signed)
Subjective:    Patient ID: Lisa Douglas, female    DOB: [birth year], 78 y.o.   MRN: [id number]  HPI 78 y/o WF here for a follow up visit... he has multiple medical problems as noted below...   ~  September 30, 2010:  she's had another good 182mo- no new complaints or concerns... she saw [facility] 1/12 for f/u chr AFib, unable to take Coumadin due to AVMs, on daily ASA/ Metoprolol/ Cardizem, doing well- no changes made...  she had BMD done by GYN, Lisa Douglas- TScore -2.5 spine & -2.4 left FemNeck (she has refused meds & he didn't change management)...     Today she requests CXR "I wheeze" although exam is clear at present (CXR showed min basilar scarring, otherw neg);  still followed at [facility] for macular degen but she states vision is fine;  Lipids stable on Simva20;  GI followed by [facility] & [place];  mod DJD but active at home she says...  ~  April 01, 2011:  182mo ROV & she continues stable w/o new complaints or concerns...    She remains in AFib w/ rate control strategy on ASA, Metoprolol, Diltiazem;  BP= 120/72 & pulse= 74;  She denies CP, palpit, dizzy, edema, etc; she notes DOE w/o change from baseline; she knows to elim sodium, elev legs, wear support hose if needed...    Lipids controlled on Simva20 + diet & FLP today looks good; chemistries are similarly wnl...    GI is stable on Prilosec, stool softeners, etc...  ~  October 05, 2011:  182mo ROV & she is stable w/ mult chronic complaints> notes gas, belching, occas nausea on & off x yrs; on Prilosec 20mg /d, "nothing bothering me now"; also loose stool x1 that was yellow, now gone & hasn't recurred; we discussed taking Align & Activia...  We reviewed her meds & full labs done 8/12>>    Cardiac stable w/ chr AFib on ASA, Cardizem & Metoprolol> BP= 126/74 & feels well w/o HA, visual symptoms, CP, palpit, ch in SOB, edema, etc; not on Coumadin due to [facility] & GIB; she sees Lisa Douglas once a yr & she's been stable...    We reviewed  her Osteoporosis, prev followed by [facility] but she doesn't think she needs any further GYN checks; she has repeatedly refused any & all osteoporosis rx; again rec that she consider Alendronate or Atelvia orally or Reclast by infusion but she declines...    We reviewed meds & prev labs; she requests Rx for Shingles vaccine and refill Alprazolam...  ~  March 13,2013:  14mo ROV & add-on for Acute URI symptoms> c/o 4-5d hx "cold" w/ sneezing, head congestion, sinus draining, then cough, low grade temp, clear phlegm, sore from the coughing & can't rest; she has tried OTC Tylenol & nasal saline but no relief; exam shows T99.4, red inflammed pharynx, nasal congestion & drainage, chest rhonchi & mild exp wheezing; we discussed treatment w/ Rest, Tylenol, MucinexDM, Fluids, and ZPak/ Depo/ Pred taper/ & Tussionex...    Other problems including her AFib, Chol, Gerd, DJD, etc are essentially stable- continue same meds...  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    She is requesting a pain piull for her c/o LBP & we agreed to Tramadol prn; her energy is basically pretty good- "I get tired" she says; BP= 112/62 on Metoprolol25mg -3Bid (75mg Bid) and Diltiazem240mg /d; she remains in AFib w/ controlled rate on these meds; other meds as listed remain the same...    We  reviewed prob list, meds, xrays and labs> see below for updates >>  ~  April 26, 2012:  51mo ROV & [facility] is improved, getting her appetite back & feeling better;  She had f/u appt w/ [facility] 8/14- s/p SMA embolectomy on Coumadin in the [facility] now & he released her...  She went to the ER w/ rash on legs- dermatitis & given Pred but she stopped it )"made me nervous"); we decided to treat w/ Lidex-E cream...  She is still c/o weakness & wants to blame her meds> we went round & round about poss adjustments but she is very specific about what she will & will not take, therefore we ultimately left everything the same... She cut he Metoprolol on her own down to 2tabsBid (& will not take the XL 100 or 50mg  doses)...     We reviewed prob list, meds, xrays and labs> see below>> LABS 9/13:  FLP- at goals on Simva20;  Chems- wnl;  CBC- wnl;  TSH=2.31;  Fe=53 & 15%sat...    PROBLEM LIST:    MACULAR DEGENERATION (ICD-362.50) - she's followed in the [facility] Q68mo; on Eye Vits and she states she may need shots in her left eye per [facility]; she also reports cataracts which may require surg...  ATRIAL FIBRILLATION, CHRONIC (ICD-427.31) - on ASA 81mg /d; rate control w/ METOPROLOL 25mg -3Bid (she decr to [facility] on her own) & DILTIAZEM 240mg /d; now on Coumadin since 7/13 Hosp for SMA embolus & surg (prev not felt to be a Coumadin candidate due to colonoc AVMs); note- she had intra-abd hematoma requiring a second operation/ ELap during the 7/13 Hosp... ~  Cards f/u by Lisa Douglas yearly- doing well, no changes, made... ~  EKG 3/13 showed Fib/flutter w/ variable conduction & NSSTTWA... ~  [facility] 7/13 for Abd pain, SMA embolectomy, started on anticoagulation, subseq re-op/ ELap for intra-abd hematoma; AFib meds adjusted=> Metoprolol75mg Bid & Diltiazen240mg /d...  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  VENOUS INSUFFICIENCY (ICD-459.81) - she follows a low sodium diet... not on diuretic therapy.  HYPERLIPIDEMIA (ICD-272.4) - on SIMVASTATIN 20mg /d... ~  FLP was 6/08 showing TChol 135, TG 90, HDL 56, LDL 61... ~  FLP 2/09 showed TChol 140, TG 99, HDL 59, LDL 62... great! ~  FLP 2/10 on Vytor10/20 showed TChol 133, TG 83, HDL 56, LDL 60... rec> change to Simva20. ~  FLP 8/10 on Simva20 showed TChol 143, TG 99, HDL 51, LDL 73 ~  FLP 8/11 on Simva20 showed TChol 147, TG 103, HDL 54, LDL 72 ~  FLP 8/12 on Simva20 showed TChol 142, TG 106, HDL 63, LDL 58 ~  FLP 9/13 on Simva20 showed TChol 155, TG 142, HDL 54, LDL 72  GERD (ICD-530.81) - she takes PRILOSEC 20mg /d and remains asymptomatic...  DIVERTICULOSIS OF COLON (ICD-562.10) - no abd pain or episodes of diverticulitis; she uses stool softeners for constipation... ANGIODYSPLASIA, COLON (JXB-147.82) - last colonoscopy was 10/05 by [facility] showing divertics and colon AVMs...  DEGENERATIVE JOINT DISEASE (ICD-715.90) - only c/o some hand pain... uses OTC meds as needed... her main exercise is "up and down the steps 40-11 times a day"...  OSTEOPOROSIS (ICD-733.00) - prev took Actonel but stopped on her own 02-09-09...  takes Ca++, MVI, VitD OTC... her last BMD was 3/08 by [name] for GYN and he follows this (she gets BMD and Mammograms at his office)... she states INTOL to Alendronate "it made me sick" & refuses  to restart meds for her bones (either pills, shots, or IV Rx)... ~  labs 2/10 showed Vit D level = 29... rec> add Vit D 1000 u OTC daily. ~  labs 8/10 showed Vit D level = 31... rec> continue 1000 u daily. ~  BMD 10/11 by Lisa Douglas showed TScores -2.5 in Spine, and -2.4 in left FemNeck... ~  2/12:  Reminded of the importance of bisphos or other therapy for osteoporosis but she continues to decline treatment... ~  2/13:  Asked to reconsider treatment for her osteoporosis but she declines; asked her to discuss w/ her GYN (they do her BMDs)...  ANXIETY (ICD-300.00) - she has ALPRAZOLAM 0.5mg  - 1/2 to 1 tab Tid Prn... ~  Stress level decr since [name] passed away Feb 09, 2009 & caregiver roll is diminished...   Past Surgical History  Procedure Date  . Appendectomy 1951  . Tonsillectomy 1943  . Laparotomy 03/07/2012    Procedure: EXPLORATORY LAPAROTOMY;  Surgeon: Chuck Hint, MD;  Location: [facility] OR;  Service: Vascular;  Laterality: N/A;  Exploratory Laparotomy with Evacuation of Hematoma.    Outpatient Encounter Prescriptions as of 04/26/2012  Medication Sig Dispense Refill  . ALPRAZolam (XANAX) 0.5 MG tablet Take 0.25 mg by mouth 3 (three) times daily as needed. For anxiety      . Calcium Carbonate-Vitamin D (CALTRATE 600+D) 600-400 MG-UNIT per tablet Take 1 tablet by mouth daily.       . cholecalciferol (VITAMIN D) 1000 UNITS tablet Take 1,000 Units by mouth daily.       Marland Kitchen diltiazem (CARDIZEM CD) 240 MG 24 hr capsule Take 1 capsule (240 mg total) by mouth daily.  30 capsule  6  . metoprolol tartrate (LOPRESSOR) 25 MG tablet Take 50 mg by mouth 2 (two) times daily.      . Multiple Vitamins-Minerals (EYE VITAMINS) TABS Take 1 tablet by mouth daily.        . Multiple Vitamins-Minerals (MEGA MULTIVITAMIN FOR WOMEN PO) Take 1 tablet by mouth daily.      Marland Kitchen omeprazole (PRILOSEC OTC) 20 MG tablet Take 20 mg by mouth  daily.        . simvastatin (ZOCOR) 20 MG tablet TAKE ONE TABLET BY MOUTH AT BEDTIME  30  tablet  5  . warfarin (COUMADIN) 3 MG tablet Take 1.5 mg by mouth daily. 0.5 tab daily.      Marland Kitchen DISCONTD: predniSONE (DELTASONE) 10 MG tablet Take 2 tablets (20 mg total) by mouth daily.  10 tablet  0    Allergies  Allergen Reactions  . Penicillins     angioedema  . Alendronate Sodium     REACTION: INTOL to Fosamax per pt.  . Atorvastatin     REACTION: INTOL to Lipitor per pt.  . Morphine And Related     Makes her mean per pt    Current Medications, Allergies, Past Medical History, Past Surgical History, Family History, and Social History were reviewed in [employer] record.    Review of Systems    See HPI - all other systems neg except as noted...  The patient complains of dyspnea on exertion.  The patient denies anorexia, fever, weight loss, weight gain, vision loss, decreased hearing, hoarseness, chest pain, syncope, peripheral edema, prolonged cough, headaches, hemoptysis, abdominal pain, melena, hematochezia, severe indigestion/heartburn, hematuria, incontinence, muscle weakness, suspicious skin lesions, transient blindness, difficulty walking, depression, unusual weight change, abnormal bleeding, enlarged lymph nodes, and angioedema.    Objective:   Physical Exam     WD, WN, 78 y/o WF in NAD... GENERAL:  Alert & oriented; pleasant & cooperative... HEENT:  [place]/AT, EOM-wnl, PERRLA, EACs-clear, TMs-wnl, NOSE- red, congested, THROAT- red/ inflammed, mucoid drainage. NECK:  Supple w/ fairROM; no JVD; normal carotid impulses w/o bruits; no thyromegaly or nodules palpated; no lymphadenopathy. CHEST:  Rhonchi R>L w/ some exp wheezing; no rales or signs of consolidation... HEART:  irreg w/ more rapid response, without murmurs/ rubs/ or gallops heard... ABDOMEN:  Soft & nontender; normal bowel sounds; no organomegaly or masses detected. EXT: without deformities, mild arthritic changes; no varicose veins/ +venous insuffic/ no edema. NEURO:  CN's intact; no focal  neuro deficits... DERM:  No lesions noted; no rash etc...  RADIOLOGY DATA:  Reviewed in the EPIC EMR & discussed w/ the patient...  LABORATORY DATA:  Reviewed in the EPIC EMR & discussed w/ the patient...   Assessment & Plan:    S/P Hosp 7/13 for Abd pain found to be due to SMA embolus=> s/p embolectomy etc; subseq intra-abd hematoma ELap surg, etc... Now much improved & stable on Coumadin via CC...   Atrial Fibrillation>  On Coumadin, Metoprolol, Diltiazem; rate control strategy w/ improved parameters...  Ven Insuffic>  Doing satis w/o signif edema...  Hyperlipid> stable on Simva20 w/ excellent FLP...  GI> GERD, Divertics, AVMs>  Followed by [facility] & stable on Prilosec & stool softeners...  DJD>  She uses OTC analgesics prn...  Osteoporosis>  BMDs followed by her GYN [facility]; she's refused Bisphos Rx- on calcium, MVI, Vit D...  Anxiety>  On Alprazolam 0.5mg  prn...  MACULAR DEGEN, Cataracts>  Followed by [facility] in [place] clinic...   Patient's Medications  New Prescriptions   DILTIAZEM (DILACOR XR) 180 MG 24 HR CAPSULE    Take 1 capsule (180 mg total) by mouth daily.   FLUOCINONIDE-EMOLLIENT (LIDEX-E) 0.05 % CREAM    Apply topically 2 (two) times daily.  Previous Medications   ALPRAZOLAM (XANAX) 0.5 MG TABLET    Take 0.25 mg by mouth 3 (three) times daily as needed. For anxiety   CALCIUM CARBONATE-VITAMIN D (CALTRATE 600+D) 600-400 MG-UNIT  PER TABLET    Take 1 tablet by mouth daily.    CHOLECALCIFEROL (VITAMIN D) 1000 UNITS TABLET    Take 1,000 Units by mouth daily.    METOPROLOL TARTRATE (LOPRESSOR) 25 MG TABLET    Take 50 mg by mouth 2 (two) times daily.   MULTIPLE VITAMINS-MINERALS (EYE VITAMINS) TABS    Take 1 tablet by mouth daily.     MULTIPLE VITAMINS-MINERALS (MEGA MULTIVITAMIN FOR WOMEN PO)    Take 1 tablet by mouth daily.   OMEPRAZOLE (PRILOSEC OTC) 20 MG TABLET    Take 20 mg by mouth daily.     WARFARIN (COUMADIN) 3 MG TABLET    Take 1.5 mg by mouth  daily. 0.5 tab daily.  Modified Medications   Modified Medication Previous Medication   SIMVASTATIN (ZOCOR) 20 MG TABLET simvastatin (ZOCOR) 20 MG tablet      TAKE ONE TABLET BY MOUTH AT BEDTIME    TAKE ONE TABLET BY MOUTH AT BEDTIME  Discontinued Medications   DILTIAZEM (CARDIZEM CD) 240 MG 24 HR CAPSULE    Take 1 capsule (240 mg total) by mouth daily.   METOPROLOL SUCCINATE (TOPROL-XL) 100 MG 24 HR TABLET    Take 1 tablet (100 mg total) by mouth daily. Take with or immediately following a meal.   METOPROLOL TARTRATE (LOPRESSOR) 25 MG TABLET    Take 50 mg by mouth 2 (two) times daily.   PREDNISONE (DELTASONE) 10 MG TABLET    Take 2 tablets (20 mg total) by mouth daily.

## 2012-04-26 NOTE — Patient Instructions (Addendum)
Today we updated your med list in our EPIC system...     We decided to change your DILTIAZEM from 240mg  to 180mg /d...  We decided to change your METOPROLOL to the XL form- 100mg  one daily...  We wrote a new prescription for LIDEX-E cream (generic equiv) to use twice daily on your rash...  For your Constipation:    You may use MIRALAX- one capful of the powder in water or juice daily...     You may use SENAKOT-S 1-2 tabs daily...  Today we did your follow up FASTING blood work...    We will call you w/ the results...  Let's plan a follow up visit in 3 months.Marland KitchenMarland Kitchen

## 2012-04-27 ENCOUNTER — Other Ambulatory Visit: Payer: Self-pay | Admitting: Pulmonary Disease

## 2012-04-27 ENCOUNTER — Telehealth: Payer: Self-pay | Admitting: Pulmonary Disease

## 2012-04-27 NOTE — Telephone Encounter (Signed)
Per SN---if she has been taking the metoprolol 25  2 po bid then she can keep it at this dose.  How has she been taking?  She told SN that she had been taking 25 mg   2 in the am and 1 in the evening.  Tell her to take what she has been taking at home and we will update her med list. thanks

## 2012-04-27 NOTE — Telephone Encounter (Signed)
Per pt she wants to take metoprolol 25 mg 2 po BID. She did not need refills and I have updated med list. Nothing further was needed

## 2012-04-27 NOTE — Telephone Encounter (Signed)
Called spoke with patient who is wondering how much metoprolol she is to be taking.  She is aware SN rec'd she increase her metoprolol 100mg  QD.  Pt feels this is too much for her and that she will not be able to function.  Pt stated she was d/c'd from the hospital on 7.26.13 on metoprolol 25mg  3tabs bid and called the office on 8.7.13 w/ complaints that the metoprolol was too strong for her > decreased to 25mg  2tabs bid.    Dr [name] please advise if pt's metoprolol is to be decreased from the 100mg  qd as directed from yesterday's visit.  Thanks.

## 2012-05-05 ENCOUNTER — Ambulatory Visit (INDEPENDENT_AMBULATORY_CARE_PROVIDER_SITE_OTHER): Payer: Medicare Other | Admitting: *Deleted

## 2012-05-05 DIAGNOSIS — K55059 Acute (reversible) ischemia of intestine, part and extent unspecified: Secondary | ICD-10-CM

## 2012-05-05 DIAGNOSIS — R58 Hemorrhage, not elsewhere classified: Secondary | ICD-10-CM

## 2012-05-05 DIAGNOSIS — Z7901 Long term (current) use of anticoagulants: Secondary | ICD-10-CM | POA: Diagnosis not present

## 2012-05-05 DIAGNOSIS — K661 Hemoperitoneum: Secondary | ICD-10-CM

## 2012-05-05 DIAGNOSIS — K55069 Acute infarction of intestine, part and extent unspecified: Secondary | ICD-10-CM

## 2012-05-05 DIAGNOSIS — I4891 Unspecified atrial fibrillation: Secondary | ICD-10-CM

## 2012-05-18 DIAGNOSIS — Z85820 Personal history of malignant melanoma of skin: Secondary | ICD-10-CM | POA: Diagnosis not present

## 2012-05-18 DIAGNOSIS — L819 Disorder of pigmentation, unspecified: Secondary | ICD-10-CM | POA: Diagnosis not present

## 2012-05-26 ENCOUNTER — Ambulatory Visit (INDEPENDENT_AMBULATORY_CARE_PROVIDER_SITE_OTHER): Payer: Medicare Other | Admitting: *Deleted

## 2012-05-26 DIAGNOSIS — R58 Hemorrhage, not elsewhere classified: Secondary | ICD-10-CM | POA: Diagnosis not present

## 2012-05-26 DIAGNOSIS — K55059 Acute (reversible) ischemia of intestine, part and extent unspecified: Secondary | ICD-10-CM

## 2012-05-26 DIAGNOSIS — I4891 Unspecified atrial fibrillation: Secondary | ICD-10-CM | POA: Diagnosis not present

## 2012-05-26 DIAGNOSIS — K661 Hemoperitoneum: Secondary | ICD-10-CM

## 2012-05-26 DIAGNOSIS — Z7901 Long term (current) use of anticoagulants: Secondary | ICD-10-CM

## 2012-05-26 DIAGNOSIS — K55069 Acute infarction of intestine, part and extent unspecified: Secondary | ICD-10-CM

## 2012-06-01 ENCOUNTER — Ambulatory Visit (INDEPENDENT_AMBULATORY_CARE_PROVIDER_SITE_OTHER): Payer: Medicare Other

## 2012-06-01 DIAGNOSIS — Z23 Encounter for immunization: Secondary | ICD-10-CM

## 2012-06-02 DIAGNOSIS — Z23 Encounter for immunization: Secondary | ICD-10-CM | POA: Diagnosis not present

## 2012-06-09 ENCOUNTER — Ambulatory Visit (INDEPENDENT_AMBULATORY_CARE_PROVIDER_SITE_OTHER): Payer: Medicare Other | Admitting: *Deleted

## 2012-06-09 DIAGNOSIS — K55059 Acute (reversible) ischemia of intestine, part and extent unspecified: Secondary | ICD-10-CM

## 2012-06-09 DIAGNOSIS — I4891 Unspecified atrial fibrillation: Secondary | ICD-10-CM

## 2012-06-09 DIAGNOSIS — K661 Hemoperitoneum: Secondary | ICD-10-CM

## 2012-06-09 DIAGNOSIS — K55069 Acute infarction of intestine, part and extent unspecified: Secondary | ICD-10-CM

## 2012-06-09 DIAGNOSIS — Z7901 Long term (current) use of anticoagulants: Secondary | ICD-10-CM

## 2012-06-09 DIAGNOSIS — R58 Hemorrhage, not elsewhere classified: Secondary | ICD-10-CM

## 2012-06-09 LAB — POCT INR: INR: 1.8

## 2012-06-23 ENCOUNTER — Ambulatory Visit (INDEPENDENT_AMBULATORY_CARE_PROVIDER_SITE_OTHER): Payer: Medicare Other | Admitting: *Deleted

## 2012-06-23 DIAGNOSIS — R58 Hemorrhage, not elsewhere classified: Secondary | ICD-10-CM

## 2012-06-23 DIAGNOSIS — Z7901 Long term (current) use of anticoagulants: Secondary | ICD-10-CM | POA: Diagnosis not present

## 2012-06-23 DIAGNOSIS — I4891 Unspecified atrial fibrillation: Secondary | ICD-10-CM

## 2012-06-23 DIAGNOSIS — K55069 Acute infarction of intestine, part and extent unspecified: Secondary | ICD-10-CM

## 2012-06-23 DIAGNOSIS — K55059 Acute (reversible) ischemia of intestine, part and extent unspecified: Secondary | ICD-10-CM

## 2012-06-23 DIAGNOSIS — K661 Hemoperitoneum: Secondary | ICD-10-CM

## 2012-06-23 LAB — POCT INR: INR: 1.6

## 2012-07-07 ENCOUNTER — Ambulatory Visit (INDEPENDENT_AMBULATORY_CARE_PROVIDER_SITE_OTHER): Payer: Medicare Other

## 2012-07-07 DIAGNOSIS — R58 Hemorrhage, not elsewhere classified: Secondary | ICD-10-CM | POA: Diagnosis not present

## 2012-07-07 DIAGNOSIS — K55059 Acute (reversible) ischemia of intestine, part and extent unspecified: Secondary | ICD-10-CM | POA: Diagnosis not present

## 2012-07-07 DIAGNOSIS — I4891 Unspecified atrial fibrillation: Secondary | ICD-10-CM

## 2012-07-07 DIAGNOSIS — Z7901 Long term (current) use of anticoagulants: Secondary | ICD-10-CM | POA: Diagnosis not present

## 2012-07-07 DIAGNOSIS — K661 Hemoperitoneum: Secondary | ICD-10-CM

## 2012-07-07 DIAGNOSIS — K55069 Acute infarction of intestine, part and extent unspecified: Secondary | ICD-10-CM

## 2012-07-07 LAB — POCT INR: INR: 2.7

## 2012-07-25 ENCOUNTER — Ambulatory Visit (INDEPENDENT_AMBULATORY_CARE_PROVIDER_SITE_OTHER): Payer: Medicare Other | Admitting: *Deleted

## 2012-07-25 DIAGNOSIS — R58 Hemorrhage, not elsewhere classified: Secondary | ICD-10-CM | POA: Diagnosis not present

## 2012-07-25 DIAGNOSIS — I4891 Unspecified atrial fibrillation: Secondary | ICD-10-CM | POA: Diagnosis not present

## 2012-07-25 DIAGNOSIS — K661 Hemoperitoneum: Secondary | ICD-10-CM

## 2012-07-25 DIAGNOSIS — K55059 Acute (reversible) ischemia of intestine, part and extent unspecified: Secondary | ICD-10-CM | POA: Diagnosis not present

## 2012-07-25 DIAGNOSIS — Z7901 Long term (current) use of anticoagulants: Secondary | ICD-10-CM | POA: Diagnosis not present

## 2012-07-25 DIAGNOSIS — K55069 Acute infarction of intestine, part and extent unspecified: Secondary | ICD-10-CM

## 2012-07-25 LAB — POCT INR: INR: 2.4

## 2012-07-27 ENCOUNTER — Ambulatory Visit (INDEPENDENT_AMBULATORY_CARE_PROVIDER_SITE_OTHER): Payer: Medicare Other | Admitting: Pulmonary Disease

## 2012-07-27 ENCOUNTER — Encounter: Payer: Self-pay | Admitting: Pulmonary Disease

## 2012-07-27 VITALS — BP 118/72 | HR 83 | Temp 96.6°F | Ht 65.5 in | Wt 145.8 lb

## 2012-07-27 DIAGNOSIS — I872 Venous insufficiency (chronic) (peripheral): Secondary | ICD-10-CM | POA: Diagnosis not present

## 2012-07-27 DIAGNOSIS — Z7901 Long term (current) use of anticoagulants: Secondary | ICD-10-CM

## 2012-07-27 DIAGNOSIS — E785 Hyperlipidemia, unspecified: Secondary | ICD-10-CM

## 2012-07-27 DIAGNOSIS — K219 Gastro-esophageal reflux disease without esophagitis: Secondary | ICD-10-CM

## 2012-07-27 DIAGNOSIS — I4891 Unspecified atrial fibrillation: Secondary | ICD-10-CM

## 2012-07-27 DIAGNOSIS — K55069 Acute infarction of intestine, part and extent unspecified: Secondary | ICD-10-CM

## 2012-07-27 DIAGNOSIS — M810 Age-related osteoporosis without current pathological fracture: Secondary | ICD-10-CM

## 2012-07-27 DIAGNOSIS — K55059 Acute (reversible) ischemia of intestine, part and extent unspecified: Secondary | ICD-10-CM | POA: Diagnosis not present

## 2012-07-27 DIAGNOSIS — K5520 Angiodysplasia of colon without hemorrhage: Secondary | ICD-10-CM

## 2012-07-27 DIAGNOSIS — H3530 Unspecified macular degeneration: Secondary | ICD-10-CM

## 2012-07-27 DIAGNOSIS — K5730 Diverticulosis of large intestine without perforation or abscess without bleeding: Secondary | ICD-10-CM

## 2012-07-27 DIAGNOSIS — F411 Generalized anxiety disorder: Secondary | ICD-10-CM

## 2012-07-27 DIAGNOSIS — M1990 Unspecified osteoarthritis, unspecified site: Secondary | ICD-10-CM

## 2012-07-27 MED ORDER — METOPROLOL SUCCINATE ER 25 MG PO TB24: 25.0000 mg | ORAL_TABLET | Freq: Every day | ORAL | Status: DC

## 2012-07-27 NOTE — Patient Instructions (Addendum)
Today we updated your med list in our EPIC system...    Continue your current medications the same...  We decided to wean down the Metoprolol 25mg  to 1 tab twice daily...    Then you may switch back to the TOPROL XL 25mg  per day...  Call for any questions...  Let's plan a check up in 3 months w/ blood work at that time.Marland KitchenMarland Kitchen

## 2012-07-27 NOTE — Progress Notes (Signed)
Subjective:    Patient ID: Lisa Douglas, female    DOB: [birth year], 78 y.o.   MRN: [id number]  HPI 78 y/o WF here for a follow up visit... he has multiple medical problems as noted below...   ~  September 30, 2010:  she's had another good 182mo- no new complaints or concerns... she saw [name] 1/12 for f/u chr AFib, unable to take Coumadin due to AVMs, on daily ASA/ Metoprolol/ Cardizem, doing well- no changes made...  she had BMD done by GYN, [name]- TScore -2.5 spine & -2.4 left FemNeck (she has refused meds & he didn't change management)...     Today she requests CXR "I wheeze" although exam is clear at present (CXR showed min basilar scarring, otherw neg);  still followed at [facility] clinic for macular degen but she states vision is fine;  Lipids stable on Simva20;  GI followed by [name] & [place];  mod DJD but active at home she says...  ~  April 01, 2011:  182mo ROV & she continues stable w/o new complaints or concerns...    She remains in AFib w/ rate control strategy on ASA, Metoprolol, Diltiazem;  BP= 120/72 & pulse= 74;  She denies CP, palpit, dizzy, edema, etc; she notes DOE w/o change from baseline; she knows to elim sodium, elev legs, wear support hose if needed...    Lipids controlled on Simva20 + diet & FLP today looks good; chemistries are similarly wnl...    GI is stable on Prilosec, stool softeners, etc...  ~  October 05, 2011:  182mo ROV & she is stable w/ mult chronic complaints> notes gas, belching, occas nausea on & off x yrs; on Prilosec 20mg /d, "nothing bothering me now"; also loose stool x1 that was yellow, now gone & hasn't recurred; we discussed taking Align & Activia...  We reviewed her meds & full labs done 8/12>>    Cardiac stable w/ chr AFib on ASA, Cardizem & Metoprolol> BP= 126/74 & feels well w/o HA, visual symptoms, CP, palpit, ch in SOB, edema, etc; not on Coumadin due to [place] & GIB; she sees [name] once a yr & she's been stable...    We reviewed  her Osteoporosis, prev followed by [name] but she doesn't think she needs any further GYN checks; she has repeatedly refused any & all osteoporosis rx; again rec that she consider Alendronate or Atelvia orally or Reclast by infusion but she declines...    We reviewed meds & prev labs; she requests Rx for Shingles vaccine and refill Alprazolam...  ~  March 13,2013:  14mo ROV & add-on for Acute URI symptoms> c/o 4-5d hx "cold" w/ sneezing, head congestion, sinus draining, then cough, low grade temp, clear phlegm, sore from the coughing & can't rest; she has tried OTC Tylenol & nasal saline but no relief; exam shows T99.4, red inflammed pharynx, nasal congestion & drainage, chest rhonchi & mild exp wheezing; we discussed treatment w/ Rest, Tylenol, MucinexDM, Fluids, and ZPak/ Depo/ Pred taper/ & Tussionex...    Other problems including her AFib, Chol, Gerd, DJD, etc are essentially stable- continue same meds...  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    She is requesting a pain piull for her c/o LBP & we agreed to Tramadol prn; her energy is basically pretty good- "I get tired" she says; BP= 112/62 on Metoprolol25mg -3Bid (75mg Bid) and Diltiazem240mg /d; she remains in AFib w/ controlled rate on these meds; other meds as listed remain the same...    We  reviewed prob list, meds, xrays and labs> see below for updates >>  ~  April 26, 2012:  30mo ROV & Avi is improved, getting her appetite back & feeling better;  She had f/u appt w/ DrDickson 8/14- s/p SMA embolectomy on Coumadin in the CC now & he released her...  She went to the ER w/ rash on legs- dermatitis & given Pred but she stopped it )"made me nervous"); we decided to treat w/ Lidex-E cream...  She is still c/o weakness & wants to blame her meds> we went round & round about poss adjustments but she is very specific about what she will & will not take, therefore we ultimately left everything the same... She cut he Metoprolol on her own down to 2tabsBid (& will not take the XL 100 or 50mg  doses)...     We reviewed prob list, meds, xrays and labs> see below>> LABS 9/13:  FLP- at goals on Simva20;  Chems- wnl;  CBC- wnl;  TSH=2.31;  Fe=53 & 15%sat...   ~  July 27, 2012:  50mo ROV & Kjersten is quite set in her ways & adamant that she does not want Metop25-2Bid ("it makes me too weak") and insists on ToprolXL25mg /d... We reviewed the following problems today:    AFib> on Metoprolol as above, Dilacor180, Coumadin via CC; BP=118/72, pulse 83 & irreg; she denies CP, palpit, ch in SOB, edema, etc...    ASPVD> hx SMA thrombosis & embolectomy w/ bovine patch angioplasty by DrCDickson 7/13; stable on the coumadin; pulses intact w/o bruits etc...    Chol> on Simva20; last FLP 9/13 showed TChol 155, TG 142, HDL 54, LDL 72; continue same...    GI- GERD, Divertics, Angiodysplasia> on Prilosec20; she has some intermit abd discomfort, denies n/v c/d or blood seen...    DJD, Osteoporosis> on Calcium, MVI, VitD2000; uses Tylenol for pain...    Anxiety> on Xanax 0.5mg  prn and she copes very well... We reviewed prob list, meds, xrays and labs> see below for updates >> she had the 2013 Flu vaccine in Oct...         PROBLEM LIST:    MACULAR DEGENERATION (ICD-362.50) - she's followed in the [facility] clinic Q44mo;  on Eye Vits and she states she may need shots in her left eye per [name]; she also reports cataracts which may require surg...  ATRIAL FIBRILLATION, CHRONIC (ICD-427.31) - on ASA 81mg /d; rate control w/ METOPROLOL 25mg -3Bid (she decr to [facility] on her own) & DILTIAZEM 240mg /d; now on Coumadin since 7/13 Hosp for SMA embolus & surg (prev not felt to be a Coumadin candidate due to colonoc AVMs); note- she had intra-abd hematoma requiring a second operation/ ELap during the 7/13 Hosp... ~  Cards f/u by [name]  yearly- doing well, no changes, made... ~  EKG 3/13 showed Fib/flutter w/ variable conduction & NSSTTWA... ~  [facility] 7/13 for Abd pain, SMA embolectomy, started on anticoagulation, subseq re-op/ ELap for intra-abd hematoma; AFib meds adjusted=> Metoprolol75mg Bid & Diltiazen240mg /d... ~  12/13: she refuses to take the Metop25-2Bid & insists on TOPROL XL 25mg /d...  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  VENOUS INSUFFICIENCY (ICD-459.81) - she follows a low sodium diet... not on diuretic therapy.  HYPERLIPIDEMIA (ICD-272.4) - on SIMVASTATIN 20mg /d... ~  FLP was 6/08 showing TChol 135, TG 90, HDL 56, LDL 61... ~  FLP 2/09 showed TChol 140, TG 99, HDL 59, LDL 62... great! ~  FLP 2/10 on Vytor10/20 showed TChol 133, TG 83, HDL 56, LDL 60... rec>  change to Simva20. ~  FLP 8/10 on Simva20 showed TChol 143, TG 99, HDL 51, LDL 73 ~  FLP 8/11 on Simva20 showed TChol 147, TG 103, HDL 54, LDL 72 ~  FLP 8/12 on Simva20 showed TChol 142, TG 106, HDL 63, LDL 58 ~  FLP 9/13 on Simva20 showed TChol 155, TG 142, HDL 54, LDL 72  GERD (ICD-530.81) - she takes PRILOSEC 20mg /d and remains asymptomatic...  DIVERTICULOSIS OF COLON (ICD-562.10) - no abd pain or episodes of diverticulitis; she uses stool softeners for constipation... ANGIODYSPLASIA, COLON (AVW-098.11) - last colonoscopy was 10/05 by [name] showing divertics and colon AVMs...  DEGENERATIVE JOINT DISEASE (ICD-715.90) - only c/o some hand pain... uses OTC meds as needed... her main exercise is "up and down the steps 40-11 times a day"...  OSTEOPOROSIS (ICD-733.00) - prev took Actonel but stopped on her own 01-26-09...  takes Ca++, MVI, VitD OTC... her last BMD was 3/08 by [name] for GYN and he follows this (she gets BMD and Mammograms at his office)... she states INTOL to Alendronate "it made me sick" & refuses to restart meds for her bones (either pills, shots, or IV Rx)... ~  labs 2/10 showed Vit D level = 29... rec> add Vit D 1000 u OTC daily. ~  labs 8/10 showed Vit D level = 31... rec> continue 1000 u daily. ~  BMD 10/11 by [name] showed TScores -2.5 in Spine, and -2.4 in left FemNeck... ~  2/12:  Reminded of the importance of bisphos or other therapy for osteoporosis but she continues to decline treatment... ~  2/13:  Asked to reconsider treatment for her osteoporosis but she declines; asked her to discuss w/ her GYN (they do her BMDs)...  ANXIETY (ICD-300.00) - she has ALPRAZOLAM 0.5mg  - 1/2 to 1 tab Tid Prn... ~  Stress level decr since [name] passed away 01-26-2009 & caregiver roll is diminished...   Past Surgical History  Procedure Date  . Appendectomy 1951  . Tonsillectomy 1943  . Laparotomy 03/07/2012  Procedure: EXPLORATORY LAPAROTOMY;  Surgeon: Chuck Hint, MD;   Location: [facility] OR;  Service: Vascular;  Laterality: N/A;  Exploratory Laparotomy with Evacuation of Hematoma.    Outpatient Encounter Prescriptions as of 07/27/2012  Medication Sig Dispense Refill  . ALPRAZolam (XANAX) 0.5 MG tablet Take 0.25 mg by mouth 3 (three) times daily as needed. For anxiety      . Calcium Carbonate-Vitamin D (CALTRATE 600+D) 600-400 MG-UNIT per tablet Take 1 tablet by mouth daily.       . Cholecalciferol (VITAMIN D) 2000 UNITS CAPS Take 1 capsule by mouth daily.      Marland Kitchen diltiazem (DILACOR XR) 180 MG 24 hr capsule Take 1 capsule (180 mg total) by mouth daily.  30 capsule  11  . metoprolol tartrate (LOPRESSOR) 25 MG tablet Take 50 mg by mouth 2 (two) times daily.      . Multiple Vitamins-Minerals (EYE VITAMINS) TABS Take 1 tablet by mouth daily.        . Multiple Vitamins-Minerals (MEGA MULTIVITAMIN FOR WOMEN PO) Take 1 tablet by mouth daily.      Marland Kitchen omeprazole (PRILOSEC OTC) 20 MG tablet Take 20 mg by mouth daily.        . simvastatin (ZOCOR) 20 MG tablet TAKE ONE TABLET BY MOUTH AT BEDTIME  30 tablet  11  . warfarin (COUMADIN) 3 MG tablet Take 1.5 mg by mouth daily. 0.5 tab daily.      . [DISCONTINUED] cholecalciferol (VITAMIN D) 1000 UNITS tablet Take 1,000 Units by mouth daily.       . [DISCONTINUED] fluocinonide-emollient (LIDEX-E) 0.05 % cream Apply topically 2 (two) times daily.  30 g  5    Allergies  Allergen Reactions  . Penicillins     angioedema  . Alendronate Sodium     REACTION: INTOL to Fosamax per pt.  . Atorvastatin     REACTION: INTOL to Lipitor per pt.  . Morphine And Related     Makes her mean per pt    Current Medications, Allergies, Past Medical History, Past Surgical History, Family History, and Social History were reviewed in [employer] record.    Review of Systems    See HPI - all other systems neg except as noted...  The patient complains of dyspnea on exertion.  The patient denies anorexia, fever, weight loss,  weight gain, vision loss, decreased hearing, hoarseness, chest pain, syncope, peripheral edema, prolonged cough, headaches, hemoptysis, abdominal pain, melena, hematochezia, severe indigestion/heartburn, hematuria, incontinence, muscle weakness, suspicious skin lesions, transient blindness, difficulty walking, depression, unusual weight change, abnormal bleeding, enlarged lymph nodes, and angioedema.    Objective:   Physical Exam     WD, WN, 78 y/o WF in NAD... GENERAL:  Alert & oriented; pleasant & cooperative... HEENT:  [place]/AT, EOM-wnl, PERRLA, EACs-clear, TMs-wnl, NOSE- red, congested, THROAT- red/ inflammed, mucoid drainage. NECK:  Supple w/ fairROM; no JVD; normal carotid impulses w/o bruits; no thyromegaly or nodules palpated; no lymphadenopathy. CHEST:  Rhonchi R>L w/ some exp wheezing; no rales or signs of consolidation... HEART:  irreg w/ more rapid response, without murmurs/ rubs/ or gallops heard... ABDOMEN:  Soft & nontender; normal bowel sounds; no organomegaly or masses detected. EXT: without deformities, mild arthritic changes; no varicose veins/ +venous insuffic/ no edema. NEURO:  CN's intact; no focal neuro deficits... DERM:  No lesions noted; no rash etc...  RADIOLOGY DATA:  Reviewed in the EPIC EMR & discussed w/ the patient...  LABORATORY DATA:  Reviewed in the EPIC EMR &  discussed w/ the patient...   Assessment & Plan:    Atrial Fibrillation>  On Coumadin, Metoprolol, Diltiazem; rate control strategy w/ improved parameters...  Ven Insuffic>  Doing satis w/o signif edema...  Hyperlipid> stable on Simva20 w/ excellent FLP...  GI> GERD, Divertics, AVMs>  Followed by [name] & stable on Prilosec & stool softeners...  S/P Hosp 7/13 for Abd pain found to be due to SMA embolus=> s/p embolectomy etc; subseq intra-abd hematoma ELap surg, etc... Now much improved & stable on Coumadin via CC...  DJD>  She uses OTC analgesics prn...  Osteoporosis>  BMDs followed by  her GYN [name]; she's refused Bisphos Rx- on calcium, MVI, Vit D...  Anxiety>  On Alprazolam 0.5mg  prn...  MACULAR DEGEN, Cataracts>  Followed by [facility] in [place] clinic...   Patient's Medications  New Prescriptions   METOPROLOL SUCCINATE (TOPROL-XL) 25 MG 24 HR TABLET    Take 1 tablet (25 mg total) by mouth daily.  Previous Medications   ALPRAZOLAM (XANAX) 0.5 MG TABLET    Take 0.25 mg by mouth 3 (three) times daily as needed. For anxiety   CALCIUM CARBONATE-VITAMIN D (CALTRATE 600+D) 600-400 MG-UNIT PER TABLET    Take 1 tablet by mouth daily.    CHOLECALCIFEROL (VITAMIN D) 2000 UNITS CAPS    Take 1 capsule by mouth daily.   DILTIAZEM (DILACOR XR) 180 MG 24 HR CAPSULE    Take 1 capsule (180 mg total) by mouth daily.   MULTIPLE VITAMINS-MINERALS (EYE VITAMINS) TABS    Take 1 tablet by mouth daily.     MULTIPLE VITAMINS-MINERALS (MEGA MULTIVITAMIN FOR WOMEN PO)    Take 1 tablet by mouth daily.   OMEPRAZOLE (PRILOSEC OTC) 20 MG TABLET    Take 20 mg by mouth daily.     SIMVASTATIN (ZOCOR) 20 MG TABLET    TAKE ONE TABLET BY MOUTH AT BEDTIME   WARFARIN (COUMADIN) 3 MG TABLET    Take 1.5 mg by mouth daily. 0.5 tab daily.  Modified Medications   No medications on file  Discontinued Medications   CHOLECALCIFEROL (VITAMIN D) 1000 UNITS TABLET    Take 1,000 Units by mouth daily.    FLUOCINONIDE-EMOLLIENT (LIDEX-E) 0.05 % CREAM    Apply topically 2 (two) times daily.   METOPROLOL TARTRATE (LOPRESSOR) 25 MG TABLET    Take 50 mg by mouth 2 (two) times daily.

## 2012-08-22 ENCOUNTER — Ambulatory Visit (INDEPENDENT_AMBULATORY_CARE_PROVIDER_SITE_OTHER): Payer: Medicare Other | Admitting: *Deleted

## 2012-08-22 DIAGNOSIS — K55059 Acute (reversible) ischemia of intestine, part and extent unspecified: Secondary | ICD-10-CM

## 2012-08-22 DIAGNOSIS — Z7901 Long term (current) use of anticoagulants: Secondary | ICD-10-CM | POA: Diagnosis not present

## 2012-08-22 DIAGNOSIS — K661 Hemoperitoneum: Secondary | ICD-10-CM

## 2012-08-22 DIAGNOSIS — I4891 Unspecified atrial fibrillation: Secondary | ICD-10-CM

## 2012-08-22 DIAGNOSIS — R58 Hemorrhage, not elsewhere classified: Secondary | ICD-10-CM | POA: Diagnosis not present

## 2012-08-22 DIAGNOSIS — K55069 Acute infarction of intestine, part and extent unspecified: Secondary | ICD-10-CM

## 2012-08-22 LAB — POCT INR: INR: 2.2

## 2012-09-05 DIAGNOSIS — H35349 Macular cyst, hole, or pseudohole, unspecified eye: Secondary | ICD-10-CM | POA: Diagnosis not present

## 2012-09-05 DIAGNOSIS — H3530 Unspecified macular degeneration: Secondary | ICD-10-CM | POA: Diagnosis not present

## 2012-09-14 ENCOUNTER — Other Ambulatory Visit: Payer: Self-pay | Admitting: Internal Medicine

## 2012-09-19 ENCOUNTER — Ambulatory Visit (INDEPENDENT_AMBULATORY_CARE_PROVIDER_SITE_OTHER): Payer: Medicare Other | Admitting: *Deleted

## 2012-09-19 DIAGNOSIS — R58 Hemorrhage, not elsewhere classified: Secondary | ICD-10-CM | POA: Diagnosis not present

## 2012-09-19 DIAGNOSIS — K661 Hemoperitoneum: Secondary | ICD-10-CM

## 2012-09-19 DIAGNOSIS — Z7901 Long term (current) use of anticoagulants: Secondary | ICD-10-CM | POA: Diagnosis not present

## 2012-09-19 DIAGNOSIS — K55059 Acute (reversible) ischemia of intestine, part and extent unspecified: Secondary | ICD-10-CM

## 2012-09-19 DIAGNOSIS — I4891 Unspecified atrial fibrillation: Secondary | ICD-10-CM | POA: Diagnosis not present

## 2012-09-19 DIAGNOSIS — K55069 Acute infarction of intestine, part and extent unspecified: Secondary | ICD-10-CM

## 2012-10-10 DIAGNOSIS — H524 Presbyopia: Secondary | ICD-10-CM | POA: Diagnosis not present

## 2012-10-10 DIAGNOSIS — H52209 Unspecified astigmatism, unspecified eye: Secondary | ICD-10-CM | POA: Diagnosis not present

## 2012-10-10 DIAGNOSIS — H2589 Other age-related cataract: Secondary | ICD-10-CM | POA: Diagnosis not present

## 2012-10-10 DIAGNOSIS — H5200 Hypermetropia, unspecified eye: Secondary | ICD-10-CM | POA: Diagnosis not present

## 2012-10-17 ENCOUNTER — Ambulatory Visit (INDEPENDENT_AMBULATORY_CARE_PROVIDER_SITE_OTHER): Payer: Medicare Other | Admitting: *Deleted

## 2012-10-17 DIAGNOSIS — I4891 Unspecified atrial fibrillation: Secondary | ICD-10-CM | POA: Diagnosis not present

## 2012-10-17 DIAGNOSIS — K55059 Acute (reversible) ischemia of intestine, part and extent unspecified: Secondary | ICD-10-CM | POA: Diagnosis not present

## 2012-10-17 DIAGNOSIS — R58 Hemorrhage, not elsewhere classified: Secondary | ICD-10-CM | POA: Diagnosis not present

## 2012-10-17 DIAGNOSIS — K55069 Acute infarction of intestine, part and extent unspecified: Secondary | ICD-10-CM

## 2012-10-17 DIAGNOSIS — K661 Hemoperitoneum: Secondary | ICD-10-CM

## 2012-10-17 DIAGNOSIS — Z7901 Long term (current) use of anticoagulants: Secondary | ICD-10-CM

## 2012-10-17 LAB — POCT INR: INR: 1.9

## 2012-11-03 ENCOUNTER — Ambulatory Visit (INDEPENDENT_AMBULATORY_CARE_PROVIDER_SITE_OTHER): Payer: Medicare Other | Admitting: Pulmonary Disease

## 2012-11-03 ENCOUNTER — Other Ambulatory Visit (INDEPENDENT_AMBULATORY_CARE_PROVIDER_SITE_OTHER): Payer: Medicare Other

## 2012-11-03 ENCOUNTER — Encounter: Payer: Self-pay | Admitting: Pulmonary Disease

## 2012-11-03 VITALS — BP 120/70 | HR 80 | Temp 98.2°F | Ht 65.5 in | Wt 149.6 lb

## 2012-11-03 DIAGNOSIS — I872 Venous insufficiency (chronic) (peripheral): Secondary | ICD-10-CM

## 2012-11-03 DIAGNOSIS — Z23 Encounter for immunization: Secondary | ICD-10-CM | POA: Diagnosis not present

## 2012-11-03 DIAGNOSIS — E559 Vitamin D deficiency, unspecified: Secondary | ICD-10-CM | POA: Diagnosis not present

## 2012-11-03 DIAGNOSIS — M1990 Unspecified osteoarthritis, unspecified site: Secondary | ICD-10-CM

## 2012-11-03 DIAGNOSIS — I4891 Unspecified atrial fibrillation: Secondary | ICD-10-CM

## 2012-11-03 DIAGNOSIS — R252 Cramp and spasm: Secondary | ICD-10-CM

## 2012-11-03 DIAGNOSIS — F411 Generalized anxiety disorder: Secondary | ICD-10-CM | POA: Diagnosis not present

## 2012-11-03 DIAGNOSIS — K219 Gastro-esophageal reflux disease without esophagitis: Secondary | ICD-10-CM

## 2012-11-03 DIAGNOSIS — E785 Hyperlipidemia, unspecified: Secondary | ICD-10-CM

## 2012-11-03 DIAGNOSIS — K55059 Acute (reversible) ischemia of intestine, part and extent unspecified: Secondary | ICD-10-CM | POA: Diagnosis not present

## 2012-11-03 DIAGNOSIS — K5730 Diverticulosis of large intestine without perforation or abscess without bleeding: Secondary | ICD-10-CM

## 2012-11-03 DIAGNOSIS — K55069 Acute infarction of intestine, part and extent unspecified: Secondary | ICD-10-CM

## 2012-11-03 DIAGNOSIS — M810 Age-related osteoporosis without current pathological fracture: Secondary | ICD-10-CM

## 2012-11-03 DIAGNOSIS — H3530 Unspecified macular degeneration: Secondary | ICD-10-CM

## 2012-11-03 LAB — BASIC METABOLIC PANEL
BUN: 16 mg/dL (ref 6–23)
CO2: 30 mEq/L (ref 19–32)
Calcium: 9 mg/dL (ref 8.4–10.5)
Glucose, Bld: 103 mg/dL — ABNORMAL HIGH (ref 70–99)
Sodium: 140 mEq/L (ref 135–145)

## 2012-11-03 LAB — CBC WITH DIFFERENTIAL/PLATELET
Basophils Absolute: 0 10*3/uL (ref 0.0–0.1)
Eosinophils Absolute: 0.1 10*3/uL (ref 0.0–0.7)
Hemoglobin: 14.3 g/dL (ref 12.0–15.0)
Lymphocytes Relative: 30.2 % (ref 12.0–46.0)
MCHC: 33.9 g/dL (ref 30.0–36.0)
Monocytes Relative: 9.4 % (ref 3.0–12.0)
Neutro Abs: 3.5 10*3/uL (ref 1.4–7.7)
Neutrophils Relative %: 58.4 % (ref 43.0–77.0)
RBC: 4.69 Mil/uL (ref 3.87–5.11)
RDW: 14.1 % (ref 11.5–14.6)

## 2012-11-03 LAB — LIPID PANEL
Cholesterol: 161 mg/dL (ref 0–200)
HDL: 53.9 mg/dL (ref >39.00)

## 2012-11-03 LAB — HEPATIC FUNCTION PANEL
ALT: 20 U/L (ref 0–35)
Albumin: 4 g/dL (ref 3.5–5.2)
Alkaline Phosphatase: 70 U/L (ref 39–117)
Total Protein: 7.3 g/dL (ref 6.0–8.3)

## 2012-11-03 MED ORDER — ALPRAZOLAM 0.5 MG PO TABS: 0.2500 mg | ORAL_TABLET | Freq: Three times a day (TID) | ORAL | Status: DC | PRN

## 2012-11-03 NOTE — Progress Notes (Signed)
Subjective:    Patient ID: Lisa Douglas, female    DOB: July 08, 1928, 79 y.o.   MRN: [id number]  HPI 79 y/o WF here for a follow up visit... he has multiple medical problems as noted below...   ~  October 05, 2011:  66mo ROV & she is stable w/ mult chronic complaints> notes gas, belching, occas nausea on & off x yrs; on Prilosec 20mg /d, "nothing bothering me now"; also loose stool x1 that was yellow, now gone & hasn't recurred; we discussed taking Align & Activia...  We reviewed her meds & full labs done 8/12>>    Cardiac stable w/ chr AFib on ASA, Cardizem & Metoprolol> BP= 126/74 & feels well w/o HA, visual symptoms, CP, palpit, ch in SOB, edema, etc; not on Coumadin due to [place] & GIB; she sees [name] once a yr & she's been stable...    We reviewed her Osteoporosis, prev followed by [name] but she doesn't think she needs any further GYN checks; she has repeatedly refused any & all osteoporosis rx; again rec that she consider Alendronate or Atelvia orally or Reclast by infusion but she declines...    We reviewed meds & prev labs; she requests Rx for Shingles vaccine and refill Alprazolam...  ~  March 13,2013:  90mo ROV & add-on for Acute URI symptoms> c/o 4-5d hx "cold" w/ sneezing, head congestion, sinus draining, then cough, low grade temp, clear phlegm, sore from the coughing & can't rest; she has tried OTC Tylenol & nasal saline but no relief; exam shows T99.4, red inflammed pharynx, nasal congestion & drainage, chest rhonchi & mild exp wheezing; we discussed treatment w/ Rest, Tylenol, MucinexDM, Fluids, and ZPak/ Depo/ Pred taper/ & Tussionex...    Other problems including her AFib, Chol, Gerd, DJD, etc are essentially stable- continue same meds...  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    She is requesting a pain piull for her c/o LBP & we agreed to Tramadol prn; her energy is basically pretty good- "I get tired" she says; BP= 112/62 on Metoprolol25mg -3Bid (75mg Bid) and Diltiazem240mg /d; she remains in AFib w/ controlled rate on these meds; other meds as listed remain the same...    We reviewed prob list, meds, xrays and labs> see below for updates >>  ~  April 26, 2012:  90mo ROV & Lisa Douglas is improved, getting her appetite back & feeling better;  She had f/u appt w/ DrDickson 8/14- s/p SMA embolectomy on Coumadin in the CC now & he released her...  She went to the ER w/ rash on legs- dermatitis & given Pred but she stopped it )"made me nervous"); we decided to treat w/ Lidex-E cream...  She is still c/o weakness & wants to blame her meds> we went  round & round about poss adjustments but she is very specific about what she will & will not take, therefore we ultimately left everything the same... She cut he Metoprolol on her own down to 2tabsBid (& will not take the XL 100 or 50mg  doses)...     We reviewed prob list, meds, xrays and labs> see below>> LABS 9/13:  FLP- at goals on Simva20;  Chems- wnl;  CBC- wnl;  TSH=2.31;  Fe=53 & 15%sat...   ~  July 27, 2012:  6mo ROV & Lisa Douglas is quite set in her ways & adamant that she does not want Metop25-2Bid ("it makes me too weak") and insists on ToprolXL25mg /d... We reviewed the following problems today:    AFib> on Metoprolol as above, Dilacor180, Coumadin via CC; BP=118/72, pulse 83 &  irreg; she denies CP, palpit, ch in SOB, edema, etc...    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    Chol> on Simva20; last FLP 9/13 showed TChol 155, TG 142, HDL 54, LDL 72; continue same...    GI- GERD, Divertics, Angiodysplasia> on Prilosec20; she has some intermit abd discomfort, denies n/v c/d or blood seen...    DJD, Osteoporosis> on Calcium, MVI, VitD2000; uses Tylenol for pain...    Anxiety> on Xanax 0.5mg  prn and she copes very well... We reviewed prob list, meds, xrays and labs> see below for updates >> she had the 2013 Flu vaccine in Oct...  ~  November 03, 2012:  6mo ROV & Lisa Douglas reports stable on the MetopER25/d- Hx AFib controlled , BP controlled, feeling well... Her CC today is leg discomfort- mostly nocturnal, muscle cramping, etc & we discussed Rx w/ Tonic water, magnesium, etc... She also notes that "my chart" told her she needs a pneumovax...     We reviewed prob list, meds, xrays and labs> see below for updates >>  LABS 3/14:  FLP- at goals on Simva20;  Chems- wnl;  CBC- wnl;  TSH=2.57;  VitD=40           PROBLEM LIST:    MACULAR DEGENERATION (ICD-362.50) - she's followed in the [facility] [place] clinic Q86mo; on Eye Vits and she states she may need shots in her left eye per [name]; she also reports cataracts which may require surg...  ATRIAL FIBRILLATION, CHRONIC (ICD-427.31) - on ASA 81mg /d; rate control w/ METOPROLOL & DILTIAZEM; now on Coumadin since 7/13 Hosp for SMA embolus & surg (prev not felt to be a Coumadin candidate due to colonoc AVMs); note- she had intra-abd hematoma requiring a second operation/ ELap during the 7/13 Hosp... ~  Cards f/u by [name] yearly- doing well, no changes, made... ~  EKG 3/13 showed Fib/flutter w/ variable conduction & NSSTTWA... ~  [facility] 7/13 for Abd pain, SMA embolectomy, started on anticoagulation, subseq re-op/ ELap for intra-abd hematoma; AFib  meds adjusted=> Metoprolol75mg Bid & Diltiazen240mg /d=> later decr to 180mg /d... ~  12/13: she refuses to take the Metop25-2Bid & insists on TOPROL XL 25mg /d-OK... ~  3/14: on MetopER25, DiltiazemXR180, Coumadin via CC; BP= 120/70 & she denies CP, palpit, SOB, edema...  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  VENOUS INSUFFICIENCY (ICD-459.81) - she follows a low sodium diet... not on diuretic therapy.  HYPERLIPIDEMIA (ICD-272.4) - on SIMVASTATIN 20mg /d... ~  FLP was 6/08 showing TChol 135, TG 90, HDL 56, LDL 61... ~  FLP 2/09 showed TChol 140, TG 99, HDL 59, LDL 62... great! ~  FLP 2/10 on Vytor10/20 showed TChol 133, TG 83, HDL 56, LDL 60... rec> change to Simva20. ~  FLP 8/10 on Simva20 showed TChol 143, TG 99, HDL 51, LDL 73 ~  FLP 8/11 on Simva20 showed TChol 147, TG 103, HDL 54, LDL 72 ~  FLP 8/12 on Simva20 showed TChol 142, TG 106, HDL 63, LDL 58 ~  FLP 9/13 on Simva20 showed TChol 155, TG 142, HDL 54, LDL 72 ~  FLP 3/14 on Simva20 showed TChol 161, TG 111, HDL 54, LDL 85  GERD (ICD-530.81) - she takes PRILOSEC 20mg /d and remains asymptomatic...  DIVERTICULOSIS OF COLON (ICD-562.10) - no abd pain or episodes of diverticulitis; she uses stool softeners for constipation... ANGIODYSPLASIA, COLON (RUE-454.09) - last colonoscopy was  10/05 by [name] showing divertics and colon AVMs...  DEGENERATIVE JOINT DISEASE (ICD-715.90) - only c/o some hand pain... uses OTC meds as needed... her main exercise is "up and down the steps 40-11 times a day"...  OSTEOPOROSIS (ICD-733.00) - prev took Actonel but stopped on her own 2009/01/26...  takes Ca++, MVI, VitD OTC... her last BMD was 3/08 by [name] for GYN and he follows this (she gets BMD and Mammograms at his office)... she states INTOL to Alendronate "it made me sick" & refuses to restart meds for her bones (either pills, shots, or IV Rx)... ~  labs 2/10 showed Vit D level = 29... rec> add Vit D 1000 u OTC daily. ~  labs 8/10 showed Vit D level = 31... rec> continue 1000 u daily. ~  BMD 10/11 by [name] showed TScores -2.5 in Spine, and -2.4 in left FemNeck... ~  2/12:  Reminded of the importance of bisphos or other therapy for osteoporosis but she continues to decline treatment... ~  2/13:  Asked to reconsider treatment for her osteoporosis but she declines; asked her to discuss w/ her GYN (they do her BMDs)... ~  Labs 3/14 showed VitD level = 40; rec to continue VitD supplement ~2000u daily...  ANXIETY (ICD-300.00) - she has ALPRAZOLAM 0.5mg  - 1/2 to 1 tab Tid Prn... ~  Stress level decr since [name] passed away 01/26/2009 & caregiver roll is diminished...   Past Surgical History  Procedure Laterality Date  . Appendectomy  1951  . Tonsillectomy  1943  . Laparotomy  03/07/2012    Procedure: EXPLORATORY LAPAROTOMY;  Surgeon: Chuck Hint, MD;  Location: [facility] OR;  Service: Vascular;  Laterality: N/A;  Exploratory Laparotomy with Evacuation of Hematoma.    Outpatient Encounter Prescriptions as of 11/03/2012  Medication Sig Dispense Refill  . ALPRAZolam (XANAX) 0.5 MG tablet Take 0.25 mg by mouth 3 (three) times daily as needed. For anxiety      . Calcium Carbonate-Vitamin D (CALTRATE 600+D) 600-400 MG-UNIT per tablet Take 1 tablet by mouth daily.       Marland Kitchen  Cholecalciferol (VITAMIN D)  2000 UNITS CAPS Take 1 capsule by mouth daily.      Marland Kitchen diltiazem (DILACOR XR) 180 MG 24 hr capsule Take 1 capsule (180 mg total) by mouth daily.  30 capsule  11  . metoprolol succinate (TOPROL-XL) 25 MG 24 hr tablet Take 1 tablet (25 mg total) by mouth daily.  30 tablet  11  . Multiple Vitamins-Minerals (EYE VITAMINS) TABS Take 1 tablet by mouth daily.        Marland Kitchen omeprazole (PRILOSEC OTC) 20 MG tablet Take 20 mg by mouth daily.        . simvastatin (ZOCOR) 20 MG tablet TAKE ONE TABLET BY MOUTH AT BEDTIME  30 tablet  11  . warfarin (COUMADIN) 3 MG tablet TAKE AS DIRECTED BY ANTICOAGULATION CLINIC  30 tablet  2  . Multiple Vitamins-Minerals (MEGA MULTIVITAMIN FOR WOMEN PO) Take 1 tablet by mouth daily.      . [DISCONTINUED] warfarin (COUMADIN) 3 MG tablet Take 1.5 mg by mouth daily. 0.5 tab daily.       No facility-administered encounter medications on file as of 11/03/2012.    Allergies  Allergen Reactions  . Penicillins     angioedema  . Alendronate Sodium     REACTION: INTOL to Fosamax per pt.  . Atorvastatin     REACTION: INTOL to Lipitor per pt.  . Morphine And Related     Makes her mean per pt    Current Medications, Allergies, Past Medical History, Past Surgical History, Family History, and Social History were reviewed in [employer] record.    Review of Systems    See HPI - all other systems neg except as noted...  The patient complains of dyspnea on exertion.  The patient denies anorexia, fever, weight loss, weight gain, vision loss, decreased hearing, hoarseness, chest pain, syncope, peripheral edema, prolonged cough, headaches, hemoptysis, abdominal pain, melena, hematochezia, severe indigestion/heartburn, hematuria, incontinence, muscle weakness, suspicious skin lesions, transient blindness, difficulty walking, depression, unusual weight change, abnormal bleeding, enlarged lymph nodes, and angioedema.    Objective:   Physical Exam     WD, WN, 79 y/o  WF in NAD... GENERAL:  Alert & oriented; pleasant & cooperative... HEENT:  /AT, EOM-wnl, PERRLA, EACs-clear, TMs-wnl, NOSE- red, congested, THROAT- red/ inflammed, mucoid drainage. NECK:  Supple w/ fairROM; no JVD; normal carotid impulses w/o bruits; no thyromegaly or nodules palpated; no lymphadenopathy. CHEST:  Rhonchi R>L w/ some exp wheezing; no rales or signs of consolidation... HEART:  irreg w/ more rapid response, without murmurs/ rubs/ or gallops heard... ABDOMEN:  Soft & nontender; normal bowel sounds; no organomegaly or masses detected. EXT: without deformities, mild arthritic changes; no varicose veins/ +venous insuffic/ no edema. NEURO:  CN's intact; no focal neuro deficits... DERM:  No lesions noted; no rash etc...  RADIOLOGY DATA:  Reviewed in the EPIC EMR & discussed w/ the patient...  LABORATORY DATA:  Reviewed in the EPIC EMR & discussed w/ the patient...   Assessment & Plan:    Atrial Fibrillation>  On Coumadin, Metoprolol, Diltiazem; rate control strategy w/ improved parameters...  Ven Insuffic>  Doing satis w/o signif edema...  Hyperlipid> stable on Simva20 w/ excellent FLP...  GI> GERD, Divertics, AVMs>  Followed by [name] & stable on Prilosec & stool softeners...  S/P Hosp 7/13 for Abd pain found to be due to SMA embolus=> s/p embolectomy etc; subseq intra-abd hematoma ELap surg, etc... Now much improved & stable on Coumadin via CC...  DJD>  She uses OTC analgesics prn...  Osteoporosis>  BMDs followed by her GYN [name]; she's refused Bisphos Rx- on calcium, MVI, Vit D...  Anxiety>  On Alprazolam 0.5mg  prn...  MACULAR DEGEN, Cataracts>  Followed by [facility] in [place] clinic...   Patient's Medications  New Prescriptions   No medications on file  Previous Medications   CALCIUM CARBONATE-VITAMIN D (CALTRATE 600+D) 600-400 MG-UNIT PER TABLET    Take 1 tablet by mouth daily.    CHOLECALCIFEROL (VITAMIN D) 2000 UNITS CAPS    Take 1 capsule by  mouth daily.   DILTIAZEM (DILACOR XR) 180 MG 24 HR CAPSULE    Take 1 capsule (180 mg total) by mouth daily.   METOPROLOL SUCCINATE (TOPROL-XL) 25 MG 24 HR TABLET    Take 1 tablet (25 mg total) by mouth daily.   MULTIPLE VITAMINS-MINERALS (EYE VITAMINS) TABS    Take 1 tablet by mouth daily.     MULTIPLE VITAMINS-MINERALS (MEGA MULTIVITAMIN FOR WOMEN PO)    Take 1 tablet by mouth daily.   OMEPRAZOLE (PRILOSEC OTC) 20 MG TABLET    Take 20 mg by mouth daily.     SIMVASTATIN (ZOCOR) 20 MG TABLET    TAKE ONE TABLET BY MOUTH AT BEDTIME   WARFARIN (COUMADIN) 3 MG TABLET    TAKE AS DIRECTED BY ANTICOAGULATION CLINIC  Modified Medications   Modified Medication Previous Medication   ALPRAZOLAM (XANAX) 0.5 MG TABLET ALPRAZolam (XANAX) 0.5 MG tablet      Take 0.5 tablets (0.25 mg total) by mouth 3 (three) times daily as needed. For anxiety    Take 0.25 mg by mouth 3 (three) times daily as needed. For anxiety  Discontinued Medications   WARFARIN (COUMADIN) 3 MG TABLET    Take 1.5 mg by mouth daily. 0.5 tab daily.

## 2012-11-03 NOTE — Patient Instructions (Addendum)
Today we updated your med list in our EPIC system...    Continue your current medications the same...  Today we did your follow up FASTING blood work...    We will contact you w/ the results when available...   For the leg pain/ cramps>>    Try the "tonic water" trick by drinking a glass of tonic water once or twice daily as needed...    You may also try the [facility] recommendation to take on OTC Magnesium supplement daily...  We gave you a PNEUMOVAX shot today as indicated in "my chart"...  Call for any questions...  Let's plan a routine follow up in 6 months

## 2012-11-04 LAB — VITAMIN D 25 HYDROXY (VIT D DEFICIENCY, FRACTURES): Vit D, 25-Hydroxy: 40 ng/mL (ref 30–89)

## 2012-11-05 ENCOUNTER — Encounter: Payer: Self-pay | Admitting: Pulmonary Disease

## 2012-11-14 ENCOUNTER — Ambulatory Visit (INDEPENDENT_AMBULATORY_CARE_PROVIDER_SITE_OTHER): Payer: Medicare Other

## 2012-11-14 DIAGNOSIS — K661 Hemoperitoneum: Secondary | ICD-10-CM

## 2012-11-14 DIAGNOSIS — K55059 Acute (reversible) ischemia of intestine, part and extent unspecified: Secondary | ICD-10-CM

## 2012-11-14 DIAGNOSIS — R58 Hemorrhage, not elsewhere classified: Secondary | ICD-10-CM | POA: Diagnosis not present

## 2012-11-14 DIAGNOSIS — Z7901 Long term (current) use of anticoagulants: Secondary | ICD-10-CM | POA: Diagnosis not present

## 2012-11-14 DIAGNOSIS — I4891 Unspecified atrial fibrillation: Secondary | ICD-10-CM

## 2012-11-14 DIAGNOSIS — K55069 Acute infarction of intestine, part and extent unspecified: Secondary | ICD-10-CM

## 2012-11-14 LAB — POCT INR: INR: 2.2

## 2012-12-11 ENCOUNTER — Emergency Department (HOSPITAL_COMMUNITY)
Admission: EM | Admit: 2012-12-11 | Discharge: 2012-12-12 | Disposition: A | Discharge status: Home / Self Care | Payer: Medicare Other | Attending: Emergency Medicine | Admitting: Emergency Medicine

## 2012-12-11 DIAGNOSIS — Z8679 Personal history of other diseases of the circulatory system: Secondary | ICD-10-CM | POA: Diagnosis not present

## 2012-12-11 DIAGNOSIS — Z79899 Other long term (current) drug therapy: Secondary | ICD-10-CM | POA: Diagnosis not present

## 2012-12-11 DIAGNOSIS — K439 Ventral hernia without obstruction or gangrene: Secondary | ICD-10-CM | POA: Insufficient documentation

## 2012-12-11 DIAGNOSIS — I82409 Acute embolism and thrombosis of unspecified deep veins of unspecified lower extremity: Secondary | ICD-10-CM | POA: Diagnosis not present

## 2012-12-11 DIAGNOSIS — F411 Generalized anxiety disorder: Secondary | ICD-10-CM | POA: Diagnosis not present

## 2012-12-11 DIAGNOSIS — I4891 Unspecified atrial fibrillation: Secondary | ICD-10-CM | POA: Diagnosis not present

## 2012-12-11 DIAGNOSIS — M1990 Unspecified osteoarthritis, unspecified site: Secondary | ICD-10-CM | POA: Diagnosis not present

## 2012-12-11 DIAGNOSIS — Z87891 Personal history of nicotine dependence: Secondary | ICD-10-CM | POA: Diagnosis not present

## 2012-12-11 DIAGNOSIS — R109 Unspecified abdominal pain: Secondary | ICD-10-CM | POA: Diagnosis not present

## 2012-12-11 DIAGNOSIS — K219 Gastro-esophageal reflux disease without esophagitis: Secondary | ICD-10-CM | POA: Diagnosis not present

## 2012-12-11 DIAGNOSIS — H3530 Unspecified macular degeneration: Secondary | ICD-10-CM | POA: Insufficient documentation

## 2012-12-11 DIAGNOSIS — Z8719 Personal history of other diseases of the digestive system: Secondary | ICD-10-CM | POA: Diagnosis not present

## 2012-12-11 DIAGNOSIS — E785 Hyperlipidemia, unspecified: Secondary | ICD-10-CM | POA: Diagnosis not present

## 2012-12-12 ENCOUNTER — Ambulatory Visit (INDEPENDENT_AMBULATORY_CARE_PROVIDER_SITE_OTHER): Payer: Medicare Other | Admitting: Cardiology

## 2012-12-12 ENCOUNTER — Emergency Department (HOSPITAL_COMMUNITY): Payer: Medicare Other

## 2012-12-12 ENCOUNTER — Telehealth: Payer: Self-pay | Admitting: Internal Medicine

## 2012-12-12 ENCOUNTER — Encounter (HOSPITAL_COMMUNITY): Payer: Self-pay | Admitting: *Deleted

## 2012-12-12 DIAGNOSIS — R109 Unspecified abdominal pain: Secondary | ICD-10-CM | POA: Diagnosis not present

## 2012-12-12 DIAGNOSIS — R58 Hemorrhage, not elsewhere classified: Secondary | ICD-10-CM

## 2012-12-12 DIAGNOSIS — I4891 Unspecified atrial fibrillation: Secondary | ICD-10-CM

## 2012-12-12 DIAGNOSIS — K55069 Acute infarction of intestine, part and extent unspecified: Secondary | ICD-10-CM

## 2012-12-12 DIAGNOSIS — Z7901 Long term (current) use of anticoagulants: Secondary | ICD-10-CM

## 2012-12-12 DIAGNOSIS — K55059 Acute (reversible) ischemia of intestine, part and extent unspecified: Secondary | ICD-10-CM

## 2012-12-12 DIAGNOSIS — K661 Hemoperitoneum: Secondary | ICD-10-CM

## 2012-12-12 LAB — COMPREHENSIVE METABOLIC PANEL
ALT: 18 U/L (ref 0–35)
AST: 24 U/L (ref 0–37)
Albumin: 3.6 g/dL (ref 3.5–5.2)
Alkaline Phosphatase: 68 U/L (ref 39–117)
Potassium: 4.2 mEq/L (ref 3.5–5.1)
Sodium: 138 mEq/L (ref 135–145)
Total Protein: 6.7 g/dL (ref 6.0–8.3)

## 2012-12-12 LAB — CBC WITH DIFFERENTIAL/PLATELET
Basophils Relative: 0 % (ref 0–1)
Eosinophils Absolute: 0.1 10*3/uL (ref 0.0–0.7)
MCH: 30.5 pg (ref 26.0–34.0)
MCHC: 33.5 g/dL (ref 30.0–36.0)
Neutrophils Relative %: 65 % (ref 43–77)
Platelets: 197 10*3/uL (ref 150–400)
RBC: 4.56 MIL/uL (ref 3.87–5.11)

## 2012-12-12 MED ORDER — IOHEXOL 300 MG/ML  SOLN
50.0000 mL | Freq: Once | INTRAMUSCULAR | Status: AC | PRN
  Administered 2012-12-12: 50 mL via ORAL

## 2012-12-12 MED ORDER — IOHEXOL 300 MG/ML  SOLN
100.0000 mL | Freq: Once | INTRAMUSCULAR | Status: AC | PRN
  Administered 2012-12-12: 100 mL via INTRAVENOUS

## 2012-12-12 NOTE — ED Notes (Signed)
Patient states she noticed some abd swelling today. Denies pain. Patient with surgical scar to abd.

## 2012-12-12 NOTE — ED Provider Notes (Signed)
History     CSN: [id number]  Arrival date & time 12/11/12  2348   First MD Initiated Contact with Patient 12/12/12 0049      Chief Complaint  Patient presents with  . Abdominal Pain    (Consider location/radiation/quality/duration/timing/severity/associated sxs/prior treatment) HPI Comments: Patient had abdominal surgery to repair a mesenteric embolus is a midline well-healed surgical site.  Tonight, while she was preparing for bed, noticed, in the upper portion.  A hard knot that has since resolved.  She denies any trauma, nausea, vomiting, diarrhea, fever, chest pain, or shortness of breath her daughter, who is a [place] for for evaluation.  Patient is a 79 y.o. female presenting with abdominal pain. The history is provided by the patient.  Abdominal Pain Pain location:  Epigastric Pain quality: dull   Pain radiates to:  Does not radiate Pain severity:  No pain Onset quality:  Sudden Progression:  Resolved Chronicity:  New Associated symptoms: no chest pain, no chills, no constipation, no cough, no diarrhea, no dysuria, no fever, no nausea, no shortness of breath and no vomiting     Past Medical History  Diagnosis Date  . Macular degeneration (senile) of retina, unspecified   . Atrial fibrillation   . Unspecified venous (peripheral) insufficiency   . Other and unspecified hyperlipidemia   . Esophageal reflux   . Diverticulosis of colon (without mention of hemorrhage)   . Angiodysplasia of intestine (without mention of hemorrhage)   . Osteoarthrosis, unspecified whether generalized or localized, unspecified site   . Osteoporosis, unspecified   . Anxiety state, unspecified   . Mesenteric embolus   . DVT (deep venous thrombosis)     Past Surgical History  Procedure Laterality Date  . Appendectomy  1951  . Tonsillectomy  1943  . Laparotomy  03/07/2012    Procedure: EXPLORATORY LAPAROTOMY;  Surgeon: Chuck Hint, MD;  Location: [facility] OR;  Service: Vascular;   Laterality: N/A;  Exploratory Laparotomy with Evacuation of Hematoma.    Family History  Problem Relation Age of Onset  . Heart disease Sister   . Breast cancer Sister   . Lymphoma Sister   . Hemochromatosis Father     History  Substance Use Topics  . Smoking status: Former Smoker    Types: Cigarettes    Quit date: 08/24/1951  . Smokeless tobacco: Never Used     Comment: smoked at age 39 for a short time--a few months  . Alcohol Use: No    OB History   Grav Para Term Preterm Abortions TAB SAB Ect Mult Living                  Review of Systems  Constitutional: Negative for fever and chills.  Respiratory: Negative for cough and shortness of breath.   Cardiovascular: Negative for chest pain.  Gastrointestinal: Positive for abdominal pain. Negative for nausea, vomiting, diarrhea and constipation.  Genitourinary: Negative for dysuria.  Skin: Negative for wound.  Neurological: Negative for weakness.  All other systems reviewed and are negative.    Allergies  Penicillins; Alendronate sodium; Atorvastatin; and Morphine and related  Home Medications   Current Outpatient Rx  Name  Route  Sig  Dispense  Refill  . ALPRAZolam (XANAX) 0.5 MG tablet   Oral   Take 0.5 tablets (0.25 mg total) by mouth 3 (three) times daily as needed. For anxiety   90 tablet   5   . cholecalciferol (VITAMIN D) 1000 UNITS tablet   Oral  Take 1,000 Units by mouth daily.          Marland Kitchen diltiazem (DILACOR XR) 180 MG 24 hr capsule   Oral   Take 1 capsule (180 mg total) by mouth daily.   30 capsule   11   . metoprolol succinate (TOPROL-XL) 25 MG 24 hr tablet   Oral   Take 1 tablet (25 mg total) by mouth daily.   30 tablet   11   . Multiple Vitamin (MULTIVITAMIN WITH MINERALS) TABS   Oral   Take 1 tablet by mouth daily.         . multivitamin-lutein (OCUVITE-LUTEIN) CAPS   Oral   Take 1 capsule by mouth daily.         Marland Kitchen omeprazole (PRILOSEC) 20 MG capsule   Oral   Take 20 mg by  mouth daily.         . simvastatin (ZOCOR) 20 MG tablet   Oral   Take 20 mg by mouth at bedtime.          Marland Kitchen warfarin (COUMADIN) 3 MG tablet   Oral   Take 3 mg by mouth daily. 3 mg on mondays and 1.5mg  all other days           BP 143/84  Pulse 95  Temp(Src) 97.6 F (36.4 C) (Oral)  Resp 18  SpO2 95%  Physical Exam  Nursing note and vitals reviewed. Constitutional: She appears well-developed and well-nourished. She appears distressed.  HENT:  Head: Atraumatic.  Eyes: Pupils are equal, round, and reactive to light.  Neck: Normal range of motion.  Cardiovascular: Normal rate and regular rhythm.   Pulmonary/Chest: Effort normal and breath sounds normal.  Abdominal: Bowel sounds are normal. She exhibits no distension. There is tenderness. A hernia is present. Hernia confirmed positive in the ventral area.    Blue healed scar  Yellow area of discomfort     ED Course  Procedures (including critical care time)  Labs Reviewed  COMPREHENSIVE METABOLIC PANEL - Abnormal; Notable for the following:    Glucose, Bld 114 (*)    Total Bilirubin 0.2 (*)    GFR calc non Af Amer 77 (*)    GFR calc Af Amer 89 (*)    All other components within normal limits  PROTIME-INR - Abnormal; Notable for the following:    Prothrombin Time 19.3 (*)    INR 1.69 (*)    All other components within normal limits  CBC WITH DIFFERENTIAL   Ct Abdomen Pelvis W Contrast  12/12/2012  *RADIOLOGY REPORT*  Clinical Data: Abdominal pain  CT ABDOMEN AND PELVIS WITH CONTRAST  Technique:  Multidetector CT imaging of the abdomen and pelvis was performed following the standard protocol during bolus administration of intravenous contrast.  Contrast: OMNIPAQUE IOHEXOL 300 MG/ML  SOLN  Comparison: 03/07/2012, 03/03/2012.  Findings: 6 mm subpleural nodule left lower lobe on image 3.  Mild cardiomegaly.  Coronary artery calcification.  Hypodensities scattered throughout the liver are similar to prior.  Nonspecific however favored to reflect biliary cysts.  Unremarkable spleen, pancreas, biliary system, left adrenal gland. Right adrenal nodule is unchanged, nonspecific.  Mild fullness of the right renal collecting system.  Left and right parapelvic cysts.  No hydroureter or ureteral calculi.  Colonic diverticulosis.  No CT evidence for colitis.  Small hiatal hernia.  Ventral hernia containing part of the gastric body anteriorly.  No evidence for obstruction or incarceration.  Small bowel loops are normal caliber.  No free intraperitoneal  air or fluid.  No lymphadenopathy.  There is scattered atherosclerotic calcification of the aorta and its branches. No aneurysmal dilatation.  There is eccentric thickening along the left wall of the superior mesenteric artery, however no complete occlusion or central filling defect.  Thin-walled bladder.  Heterogeneous uterine enhancement.  No adnexal mass.  Multilevel degenerative changes of the imaged spine. No acute or aggressive appearing osseous lesion.  IMPRESSION: No acute abdominopelvic process identified by CT.  6 mm subpleural left lower lobe nodule. Similar to prior.  Indeterminate right adrenal nodule, unchanged.  Colonic diverticulosis.  No CT evidence for diverticulitis.  Indeterminate appearance to the endometrium. Recommend non emergent pelvic ultrasound follow-up.   Original Report Authenticated By: Jearld Lesch, M.D.      1. Ventral hernia       MDM   Patient advised to take an additional 3 mg Coumadin tonight and call PCP in the morning  Given hernia precautions         Arman Filter, NP 12/12/12 0327

## 2012-12-12 NOTE — Telephone Encounter (Signed)
New problem    Went to er last night    Pt / INR  was  1.67 .

## 2012-12-12 NOTE — ED Notes (Signed)
Pt noticed abd swelling today- denies pain- denies other sx- last BM tonight

## 2012-12-12 NOTE — Telephone Encounter (Signed)
Called pt and talked with her regarding coumadin and did encounter and made her a return appt

## 2012-12-12 NOTE — ED Notes (Signed)
Bed:WA17<BR> Expected date:<BR> Expected time:<BR> Means of arrival:<BR> Comments:<BR>

## 2012-12-13 NOTE — ED Provider Notes (Signed)
Medical screening examination/treatment/procedure(s) were conducted as a shared visit with non-physician practitioner(s) and myself.  I personally evaluated the patient during the encounter. Epigastric pain and TTP on exam with small ventral hernia easily reducible. CT scan  Results for orders placed during the hospital encounter of 12/11/12  CBC WITH DIFFERENTIAL      Result Value Range   WBC 7.8  4.0 - 10.5 K/uL   RBC 4.56  3.87 - 5.11 MIL/uL   Hemoglobin 13.9  12.0 - 15.0 g/dL   HCT 16.1  09.6 - 04.5 %   MCV 91.0  78.0 - 100.0 fL   MCH 30.5  26.0 - 34.0 pg   MCHC 33.5  30.0 - 36.0 g/dL   RDW 40.9  81.1 - 91.4 %   Platelets 197  150 - 400 K/uL   Neutrophils Relative 65  43 - 77 %   Neutro Abs 5.0  1.7 - 7.7 K/uL   Lymphocytes Relative 25  12 - 46 %   Lymphs Abs 1.9  0.7 - 4.0 K/uL   Monocytes Relative 9  3 - 12 %   Monocytes Absolute 0.7  0.1 - 1.0 K/uL   Eosinophils Relative 1  0 - 5 %   Eosinophils Absolute 0.1  0.0 - 0.7 K/uL   Basophils Relative 0  0 - 1 %   Basophils Absolute 0.0  0.0 - 0.1 K/uL  COMPREHENSIVE METABOLIC PANEL      Result Value Range   Sodium 138  135 - 145 mEq/L   Potassium 4.2  3.5 - 5.1 mEq/L   Chloride 103  96 - 112 mEq/L   CO2 25  19 - 32 mEq/L   Glucose, Bld 114 (*) 70 - 99 mg/dL   BUN 16  6 - 23 mg/dL   Creatinine, Ser 7.82  0.50 - 1.10 mg/dL   Calcium 9.1  8.4 - 95.6 mg/dL   Total Protein 6.7  6.0 - 8.3 g/dL   Albumin 3.6  3.5 - 5.2 g/dL   AST 24  0 - 37 U/L   ALT 18  0 - 35 U/L   Alkaline Phosphatase 68  39 - 117 U/L   Total Bilirubin 0.2 (*) 0.3 - 1.2 mg/dL   GFR calc non Af Amer 77 (*) >90 mL/min   GFR calc Af Amer 89 (*) >90 mL/min  PROTIME-INR      Result Value Range   Prothrombin Time 19.3 (*) 11.6 - 15.2 seconds   INR 1.69 (*) 0.00 - 1.49   Ct Abdomen Pelvis W Contrast  12/12/2012  *RADIOLOGY REPORT*  Clinical Data: Abdominal pain  CT ABDOMEN AND PELVIS WITH CONTRAST  Technique:  Multidetector CT imaging of the abdomen and pelvis  was performed following the standard protocol during bolus administration of intravenous contrast.  Contrast: OMNIPAQUE IOHEXOL 300 MG/ML  SOLN  Comparison: 03/07/2012, 03/03/2012.  Findings: 6 mm subpleural nodule left lower lobe on image 3.  Mild cardiomegaly.  Coronary artery calcification.  Hypodensities scattered throughout the liver are similar to prior. Nonspecific however favored to reflect biliary cysts.  Unremarkable spleen, pancreas, biliary system, left adrenal gland. Right adrenal nodule is unchanged, nonspecific.  Mild fullness of the right renal collecting system.  Left and right parapelvic cysts.  No hydroureter or ureteral calculi.  Colonic diverticulosis.  No CT evidence for colitis.  Small hiatal hernia.  Ventral hernia containing part of the gastric body anteriorly.  No evidence for obstruction or incarceration.  Small bowel loops  are normal caliber.  No free intraperitoneal air or fluid.  No lymphadenopathy.  There is scattered atherosclerotic calcification of the aorta and its branches. No aneurysmal dilatation.  There is eccentric thickening along the left wall of the superior mesenteric artery, however no complete occlusion or central filling defect.  Thin-walled bladder.  Heterogeneous uterine enhancement.  No adnexal mass.  Multilevel degenerative changes of the imaged spine. No acute or aggressive appearing osseous lesion.  IMPRESSION: No acute abdominopelvic process identified by CT.  6 mm subpleural left lower lobe nodule. Similar to prior.  Indeterminate right adrenal nodule, unchanged.  Colonic diverticulosis.  No CT evidence for diverticulitis.  Indeterminate appearance to the endometrium. Recommend non emergent pelvic ultrasound follow-up.   Original Report Authenticated By: Jearld Lesch, M.D.       Sunnie Nielsen, MD 12/13/12 [phone]

## 2012-12-25 ENCOUNTER — Other Ambulatory Visit: Payer: Self-pay | Admitting: Pulmonary Disease

## 2012-12-25 DIAGNOSIS — Z1231 Encounter for screening mammogram for malignant neoplasm of breast: Secondary | ICD-10-CM

## 2012-12-26 ENCOUNTER — Ambulatory Visit (INDEPENDENT_AMBULATORY_CARE_PROVIDER_SITE_OTHER): Payer: Medicare Other | Admitting: *Deleted

## 2012-12-26 DIAGNOSIS — K55059 Acute (reversible) ischemia of intestine, part and extent unspecified: Secondary | ICD-10-CM | POA: Diagnosis not present

## 2012-12-26 DIAGNOSIS — R58 Hemorrhage, not elsewhere classified: Secondary | ICD-10-CM | POA: Diagnosis not present

## 2012-12-26 DIAGNOSIS — K661 Hemoperitoneum: Secondary | ICD-10-CM

## 2012-12-26 DIAGNOSIS — Z7901 Long term (current) use of anticoagulants: Secondary | ICD-10-CM

## 2012-12-26 DIAGNOSIS — I4891 Unspecified atrial fibrillation: Secondary | ICD-10-CM | POA: Diagnosis not present

## 2012-12-26 DIAGNOSIS — K55069 Acute infarction of intestine, part and extent unspecified: Secondary | ICD-10-CM

## 2013-01-02 DIAGNOSIS — H35349 Macular cyst, hole, or pseudohole, unspecified eye: Secondary | ICD-10-CM | POA: Diagnosis not present

## 2013-01-02 DIAGNOSIS — H3530 Unspecified macular degeneration: Secondary | ICD-10-CM | POA: Diagnosis not present

## 2013-01-04 ENCOUNTER — Ambulatory Visit (HOSPITAL_COMMUNITY)
Admission: RE | Admit: 2013-01-04 | Discharge: 2013-01-04 | Disposition: A | Discharge status: Home / Self Care | Payer: Medicare Other | Source: Ambulatory Visit | Attending: Pulmonary Disease | Admitting: Pulmonary Disease

## 2013-01-04 DIAGNOSIS — Z1231 Encounter for screening mammogram for malignant neoplasm of breast: Secondary | ICD-10-CM | POA: Diagnosis not present

## 2013-01-05 ENCOUNTER — Ambulatory Visit (INDEPENDENT_AMBULATORY_CARE_PROVIDER_SITE_OTHER): Payer: Medicare Other

## 2013-01-05 DIAGNOSIS — K661 Hemoperitoneum: Secondary | ICD-10-CM

## 2013-01-05 DIAGNOSIS — K55069 Acute infarction of intestine, part and extent unspecified: Secondary | ICD-10-CM

## 2013-01-05 DIAGNOSIS — I4891 Unspecified atrial fibrillation: Secondary | ICD-10-CM | POA: Diagnosis not present

## 2013-01-05 DIAGNOSIS — K55059 Acute (reversible) ischemia of intestine, part and extent unspecified: Secondary | ICD-10-CM | POA: Diagnosis not present

## 2013-01-05 DIAGNOSIS — Z7901 Long term (current) use of anticoagulants: Secondary | ICD-10-CM

## 2013-01-05 DIAGNOSIS — K683 Retroperitoneal hematoma: Secondary | ICD-10-CM

## 2013-01-05 DIAGNOSIS — R58 Hemorrhage, not elsewhere classified: Secondary | ICD-10-CM | POA: Diagnosis not present

## 2013-01-05 LAB — POCT INR: INR: 2.4

## 2013-01-26 ENCOUNTER — Ambulatory Visit (INDEPENDENT_AMBULATORY_CARE_PROVIDER_SITE_OTHER): Payer: Medicare Other | Admitting: *Deleted

## 2013-01-26 DIAGNOSIS — R58 Hemorrhage, not elsewhere classified: Secondary | ICD-10-CM | POA: Diagnosis not present

## 2013-01-26 DIAGNOSIS — I4891 Unspecified atrial fibrillation: Secondary | ICD-10-CM

## 2013-01-26 DIAGNOSIS — K661 Hemoperitoneum: Secondary | ICD-10-CM

## 2013-01-26 DIAGNOSIS — K55059 Acute (reversible) ischemia of intestine, part and extent unspecified: Secondary | ICD-10-CM

## 2013-01-26 DIAGNOSIS — K55069 Acute infarction of intestine, part and extent unspecified: Secondary | ICD-10-CM

## 2013-01-26 DIAGNOSIS — Z7901 Long term (current) use of anticoagulants: Secondary | ICD-10-CM | POA: Diagnosis not present

## 2013-01-26 LAB — POCT INR: INR: 2.4

## 2013-02-21 ENCOUNTER — Telehealth: Payer: Self-pay | Admitting: Pharmacist

## 2013-02-21 MED ORDER — WARFARIN SODIUM 3 MG PO TABS: 3.0000 mg | ORAL_TABLET | ORAL | Status: DC

## 2013-02-21 NOTE — Telephone Encounter (Signed)
New problem    Per pt need warfarin refill

## 2013-02-27 ENCOUNTER — Ambulatory Visit (INDEPENDENT_AMBULATORY_CARE_PROVIDER_SITE_OTHER): Payer: Medicare Other | Admitting: *Deleted

## 2013-02-27 DIAGNOSIS — I4891 Unspecified atrial fibrillation: Secondary | ICD-10-CM

## 2013-02-27 DIAGNOSIS — Z7901 Long term (current) use of anticoagulants: Secondary | ICD-10-CM

## 2013-02-27 DIAGNOSIS — K661 Hemoperitoneum: Secondary | ICD-10-CM

## 2013-02-27 DIAGNOSIS — K55069 Acute infarction of intestine, part and extent unspecified: Secondary | ICD-10-CM

## 2013-02-27 DIAGNOSIS — R58 Hemorrhage, not elsewhere classified: Secondary | ICD-10-CM

## 2013-02-27 DIAGNOSIS — K55059 Acute (reversible) ischemia of intestine, part and extent unspecified: Secondary | ICD-10-CM

## 2013-03-01 ENCOUNTER — Encounter: Payer: Self-pay | Admitting: Pulmonary Disease

## 2013-03-08 ENCOUNTER — Other Ambulatory Visit: Payer: Self-pay | Admitting: *Deleted

## 2013-03-08 MED ORDER — WARFARIN SODIUM 3 MG PO TABS: 3.0000 mg | ORAL_TABLET | ORAL | Status: DC

## 2013-04-03 ENCOUNTER — Ambulatory Visit (INDEPENDENT_AMBULATORY_CARE_PROVIDER_SITE_OTHER): Payer: Medicare Other | Admitting: *Deleted

## 2013-04-03 DIAGNOSIS — R58 Hemorrhage, not elsewhere classified: Secondary | ICD-10-CM

## 2013-04-03 DIAGNOSIS — Z7901 Long term (current) use of anticoagulants: Secondary | ICD-10-CM | POA: Diagnosis not present

## 2013-04-03 DIAGNOSIS — I4891 Unspecified atrial fibrillation: Secondary | ICD-10-CM | POA: Diagnosis not present

## 2013-04-03 DIAGNOSIS — K55059 Acute (reversible) ischemia of intestine, part and extent unspecified: Secondary | ICD-10-CM | POA: Diagnosis not present

## 2013-04-03 DIAGNOSIS — K661 Hemoperitoneum: Secondary | ICD-10-CM

## 2013-04-03 DIAGNOSIS — K55069 Acute infarction of intestine, part and extent unspecified: Secondary | ICD-10-CM

## 2013-04-03 DIAGNOSIS — K683 Retroperitoneal hematoma: Secondary | ICD-10-CM

## 2013-04-03 LAB — POCT INR: INR: 2.3

## 2013-05-06 ENCOUNTER — Other Ambulatory Visit: Payer: Self-pay | Admitting: Pulmonary Disease

## 2013-05-08 ENCOUNTER — Ambulatory Visit (INDEPENDENT_AMBULATORY_CARE_PROVIDER_SITE_OTHER): Payer: Medicare Other

## 2013-05-08 DIAGNOSIS — I4891 Unspecified atrial fibrillation: Secondary | ICD-10-CM | POA: Diagnosis not present

## 2013-05-08 DIAGNOSIS — K661 Hemoperitoneum: Secondary | ICD-10-CM

## 2013-05-08 DIAGNOSIS — K55059 Acute (reversible) ischemia of intestine, part and extent unspecified: Secondary | ICD-10-CM

## 2013-05-08 DIAGNOSIS — Z7901 Long term (current) use of anticoagulants: Secondary | ICD-10-CM | POA: Diagnosis not present

## 2013-05-08 DIAGNOSIS — R58 Hemorrhage, not elsewhere classified: Secondary | ICD-10-CM | POA: Diagnosis not present

## 2013-05-08 DIAGNOSIS — K55069 Acute infarction of intestine, part and extent unspecified: Secondary | ICD-10-CM

## 2013-05-08 LAB — POCT INR: INR: 2.8

## 2013-05-09 ENCOUNTER — Ambulatory Visit (INDEPENDENT_AMBULATORY_CARE_PROVIDER_SITE_OTHER): Payer: Medicare Other | Admitting: Pulmonary Disease

## 2013-05-09 ENCOUNTER — Encounter: Payer: Self-pay | Admitting: Pulmonary Disease

## 2013-05-09 VITALS — BP 120/62 | HR 85 | Temp 97.6°F | Ht 65.5 in | Wt 159.0 lb

## 2013-05-09 DIAGNOSIS — F411 Generalized anxiety disorder: Secondary | ICD-10-CM

## 2013-05-09 DIAGNOSIS — E785 Hyperlipidemia, unspecified: Secondary | ICD-10-CM | POA: Diagnosis not present

## 2013-05-09 DIAGNOSIS — M810 Age-related osteoporosis without current pathological fracture: Secondary | ICD-10-CM

## 2013-05-09 DIAGNOSIS — M1990 Unspecified osteoarthritis, unspecified site: Secondary | ICD-10-CM

## 2013-05-09 DIAGNOSIS — K55059 Acute (reversible) ischemia of intestine, part and extent unspecified: Secondary | ICD-10-CM | POA: Diagnosis not present

## 2013-05-09 DIAGNOSIS — K219 Gastro-esophageal reflux disease without esophagitis: Secondary | ICD-10-CM

## 2013-05-09 DIAGNOSIS — Z7901 Long term (current) use of anticoagulants: Secondary | ICD-10-CM | POA: Diagnosis not present

## 2013-05-09 DIAGNOSIS — K55069 Acute infarction of intestine, part and extent unspecified: Secondary | ICD-10-CM

## 2013-05-09 DIAGNOSIS — Z23 Encounter for immunization: Secondary | ICD-10-CM | POA: Diagnosis not present

## 2013-05-09 DIAGNOSIS — I4891 Unspecified atrial fibrillation: Secondary | ICD-10-CM

## 2013-05-09 DIAGNOSIS — K5730 Diverticulosis of large intestine without perforation or abscess without bleeding: Secondary | ICD-10-CM

## 2013-05-09 DIAGNOSIS — K5520 Angiodysplasia of colon without hemorrhage: Secondary | ICD-10-CM

## 2013-05-09 NOTE — Progress Notes (Signed)
Subjective:    Patient ID: Lisa Douglas, female    DOB: [birth year], 79 y.o.   MRN: [id number]  HPI 79 y/o WF here for a follow up visit... he has multiple medical problems as noted below...   ~  October 05, 2011:  43mo ROV & she is stable w/ mult chronic complaints> notes gas, belching, occas nausea on & off x yrs; on Prilosec 20mg /d, "nothing bothering me now"; also loose stool x1 that was yellow, now gone & hasn't recurred; we discussed taking Align & Activia...  We reviewed her meds & full labs done 8/12>>    Cardiac stable w/ chr AFib on ASA, Cardizem & Metoprolol> BP= 126/74 & feels well w/o HA, visual symptoms, CP, palpit, ch in SOB, edema, etc; not on Coumadin due to [place] & GIB; she sees [name] once a yr & she's been stable...    We reviewed her Osteoporosis, prev followed by [name] but she doesn't think she needs any further GYN checks; she has repeatedly refused any & all osteoporosis rx; again rec that she consider Alendronate or Atelvia orally or Reclast by infusion but she declines...    We reviewed meds & prev labs; she requests Rx for Shingles vaccine and refill Alprazolam...  ~  March 13,2013:  64mo ROV & add-on for Acute URI symptoms> c/o 4-5d hx "cold" w/ sneezing, head congestion, sinus draining, then cough, low grade temp, clear phlegm, sore from the coughing & can't rest; she has tried OTC Tylenol & nasal saline but no relief; exam shows T99.4, red inflammed pharynx, nasal congestion & drainage, chest rhonchi & mild exp wheezing; we discussed treatment w/ Rest, Tylenol, MucinexDM, Fluids, and ZPak/ Depo/ Pred taper/ & Tussionex...    Other problems including her AFib, Chol, Gerd, DJD, etc are essentially stable- continue same meds...  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    She is requesting a pain piull for her c/o LBP & we agreed to Tramadol prn; her energy is basically pretty good- "I get tired" she says; BP= 112/62 on Metoprolol25mg -3Bid (75mg Bid) and Diltiazem240mg /d; she remains in AFib w/ controlled rate on these meds; other meds as listed remain the same...    We reviewed prob list, meds, xrays and labs> see below for updates >>  ~  April 26, 2012:  64mo ROV & Sarahy is improved, getting her appetite back & feeling better;  She had f/u appt w/ DrDickson 8/14- s/p SMA embolectomy on Coumadin in the CC now & he released her...  She went to the ER w/ rash on legs- dermatitis & given Pred but she stopped it )"made me nervous"); we decided to treat w/ Lidex-E cream...  She is still c/o weakness & wants to blame her meds> we went  round & round about poss adjustments but she is very specific about what she will & will not take, therefore we ultimately left everything the same... She cut he Metoprolol on her own down to 2tabsBid (& will not take the XL 100 or 50mg  doses)...     We reviewed prob list, meds, xrays and labs> see below>> LABS 9/13:  FLP- at goals on Simva20;  Chems- wnl;  CBC- wnl;  TSH=2.31;  Fe=53 & 15%sat...   ~  July 27, 2012:  668mo ROV & [name] is quite set in her ways & adamant that she does not want Metop25-2Bid ("it makes me too weak") and insists on ToprolXL25mg /d... We reviewed the following problems today:    AFib> on Metoprolol as above, Dilacor180, Coumadin via CC; BP=118/72, pulse 83 &  irreg; she denies CP, palpit, ch in SOB, edema, etc...    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    Chol> on Simva20; last FLP 9/13 showed TChol 155, TG 142, HDL 54, LDL 72; continue same...    GI- GERD, Divertics, Angiodysplasia> on Prilosec20; she has some intermit abd discomfort, denies n/v c/d or blood seen...    DJD, Osteoporosis> on Calcium, MVI, VitD2000; uses Tylenol for pain...    Anxiety> on Xanax 0.5mg  prn and she copes very well... We reviewed prob list, meds, xrays and labs> see below for updates >> she had the 2013 Flu vaccine in Oct...  ~  November 03, 2012:  668mo ROV & [name] reports stable on the MetopER25/d- Hx AFib controlled , BP controlled, feeling well... Her CC today is leg discomfort- mostly nocturnal, muscle cramping, etc & we discussed Rx w/ Tonic water, magnesium, etc... She also notes that "my chart" told her she needs a pneumovax...     We reviewed prob list, meds, xrays and labs> see below for updates >>  LABS 3/14:  FLP- at goals on Simva20;  Chems- wnl;  CBC- wnl;  TSH=2.57;  VitD=40   ~  May 09, 2013:  68mo ROV & [place] reports a gfood interval w/o new complaints or concerns... We reviewed the following medical problems during today's office visit >>     AFib> on MetoprololER25, Dilacor180, Coumadin via CC; BP=120/62, pulse 85 & irreg; she denies CP, palpit, ch in SOB, edema, etc...    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    Chol> on Simva20; last FLP 3/14 showed TChol 161, TG 111, HDL 54, LDL 85; continue same...    GI- GERD, Divertics, Angiodysplasia> on Prilosec20; she has some intermit abd discomfort, denies n/v c/d or blood seen; she wants to switch back to NEXIUM40- ok...Marland KitchenMarland KitchenMarland Kitchen    DJD, Osteoporosis> on Calcium, MVI, VitD2000; VitD level 4/14 = 40; uses Tylenol for  pain...    Anxiety> on Xanax 0.5mg  prn and she copes very well... We reviewed prob list, meds, xrays and labs> see below for updates >> Given the 2014 Flu shot today...          PROBLEM LIST:    MACULAR DEGENERATION (ICD-362.50) - she's followed in the [facility] [place] clinic Q45mo; on Eye Vits and she states she may need shots in her left eye per [name]; she also reports cataracts which may require surg...  ATRIAL FIBRILLATION, CHRONIC (ICD-427.31) - on ASA 81mg /d; rate control w/ METOPROLOL & DILTIAZEM; now on  Coumadin since 7/13 Hosp for SMA embolus & surg (prev not felt to be a Coumadin candidate due to colonoc AVMs); note- she had intra-abd hematoma requiring a second operation/ ELap during the 7/13 Hosp... ~  Cards f/u by [name] yearly- doing well, no changes, made... ~  EKG 3/13 showed Fib/flutter w/ variable conduction & NSSTTWA... ~  [facility] 7/13 for Abd pain, SMA embolectomy, started on anticoagulation, subseq re-op/ ELap for intra-abd hematoma; AFib meds adjusted=> Metoprolol75mg Bid & Diltiazen240mg /d=> later decr to 180mg /d... ~  12/13: she refuses to take the Metop25-2Bid & insists on TOPROL XL 25mg /d-OK... ~  3/14: on MetopER25, DiltiazemXR180, Coumadin via CC; BP= 120/70 & she denies CP, palpit, SOB, edema... 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  VENOUS INSUFFICIENCY (ICD-459.81) - she follows a low sodium diet... not on diuretic therapy.  HYPERLIPIDEMIA (ICD-272.4) - on SIMVASTATIN 20mg /d... ~  FLP was 6/08 showing TChol 135, TG 90, HDL 56, LDL 61... ~  FLP 2/09 showed TChol 140, TG 99, HDL 59, LDL 62... great! ~  FLP 2/10 on Vytor10/20 showed TChol 133, TG 83, HDL 56, LDL 60... rec> change to Simva20. ~  FLP 8/10 on Simva20 showed TChol 143, TG 99, HDL 51, LDL 73 ~  FLP 8/11 on Simva20 showed TChol 147, TG 103, HDL 54, LDL 72 ~  FLP 8/12 on Simva20 showed TChol 142, TG 106, HDL 63, LDL 58 ~  FLP 9/13 on Simva20 showed TChol 155, TG 142, HDL 54, LDL 72 ~  FLP 3/14 on Simva20 showed TChol 161, TG 111, HDL 54, LDL 85  GERD (ICD-530.81) - she takes PRILOSEC 20mg /d and remains asymptomatic...  DIVERTICULOSIS OF COLON (ICD-562.10) - no abd pain or episodes of diverticulitis; she uses stool softeners for constipation... ANGIODYSPLASIA, COLON (RUE-454.09) - last colonoscopy was 10/05 by [name] showing divertics and colon AVMs...  DEGENERATIVE JOINT DISEASE (ICD-715.90) - only c/o some hand pain... uses OTC meds as needed... her main exercise is "up and down the steps 40-11 times a day"...  OSTEOPOROSIS (ICD-733.00) - prev took Actonel but stopped on her own 2010...  takes Ca++, MVI, VitD OTC... her last BMD was 3/08 by [name] for GYN and he follows this (she gets BMD and Mammograms at his office)... she states INTOL to Alendronate "it made me sick" & refuses to restart meds for her bones (either pills, shots, or IV Rx)... ~  labs 2/10 showed Vit D level = 29... rec> add Vit D 1000 u OTC daily. ~  labs 8/10 showed Vit D level = 31... rec> continue 1000 u daily. ~  BMD 10/11 by [name] showed TScores -2.5 in Spine, and -2.4 in  left FemNeck... ~  2/12:  Reminded of the importance of  bisphos or other therapy for osteoporosis but she continues to decline treatment... ~  2/13:  Asked to reconsider treatment for her osteoporosis but she declines; asked her to discuss w/ her GYN (they do her BMDs)... ~  Labs 3/14 showed VitD level = 40; rec to continue VitD supplement ~2000u daily...  ANXIETY (ICD-300.00) - she has ALPRAZOLAM 0.5mg  - 1/2 to 1 tab Tid Prn... ~  Stress level decr since [name] passed away 01-01-2009 & caregiver roll is diminished...   Past Surgical History  Procedure Laterality Date  . Appendectomy  1951  . Tonsillectomy  1943  . Laparotomy  03/07/2012    Procedure: EXPLORATORY LAPAROTOMY;  Surgeon: Chuck Hint, MD;  Location: [facility] OR;  Service: Vascular;  Laterality: N/A;  Exploratory Laparotomy with Evacuation of Hematoma.    Outpatient Encounter Prescriptions as of 05/09/2013  Medication Sig Dispense Refill  . ALPRAZolam (XANAX) 0.5 MG tablet Take 0.5 tablets (0.25 mg total) by mouth 3 (three) times daily as needed. For anxiety  90 tablet  5  . Cholecalciferol (VITAMIN D) 2000 UNITS CAPS Take 1 capsule by mouth daily.      Marland Kitchen diltiazem (CARDIZEM CD) 180 MG 24 hr capsule TAKE ONE CAPSULE BY MOUTH EVERY DAY  30 capsule  6  . metoprolol succinate (TOPROL-XL) 25 MG 24 hr tablet Take 1 tablet (25 mg total) by mouth daily.  30 tablet  11  . Multiple Vitamin (MULTIVITAMIN WITH MINERALS) TABS Take 1 tablet by mouth daily.      . multivitamin-lutein (OCUVITE-LUTEIN) CAPS Take 1 capsule by mouth daily.      Marland Kitchen omeprazole (PRILOSEC) 20 MG capsule Take 20 mg by mouth daily.      . simvastatin (ZOCOR) 20 MG tablet Take 20 mg by mouth at bedtime.       Marland Kitchen warfarin (COUMADIN) 3 MG tablet Take 1 tablet (3 mg total) by mouth as directed.  30 tablet  3  . [DISCONTINUED] cholecalciferol (VITAMIN D) 1000 UNITS tablet Take 1,000 Units by mouth daily.        No facility-administered encounter medications on file as of 05/09/2013.    Allergies  Allergen Reactions  . Penicillins      angioedema  . Alendronate Sodium     REACTION: INTOL to Fosamax per pt.  . Atorvastatin     REACTION: INTOL to Lipitor per pt.  . Morphine And Related     Makes her mean per pt    Current Medications, Allergies, Past Medical History, Past Surgical History, Family History, and Social History were reviewed in [employer] record.    Review of Systems    See HPI - all other systems neg except as noted...  The patient complains of dyspnea on exertion.  The patient denies anorexia, fever, weight loss, weight gain, vision loss, decreased hearing, hoarseness, chest pain, syncope, peripheral edema, prolonged cough, headaches, hemoptysis, abdominal pain, melena, hematochezia, severe indigestion/heartburn, hematuria, incontinence, muscle weakness, suspicious skin lesions, transient blindness, difficulty walking, depression, unusual weight change, abnormal bleeding, enlarged lymph nodes, and angioedema.    Objective:   Physical Exam     WD, WN, 79 y/o WF in NAD... GENERAL:  Alert & oriented; pleasant & cooperative... HEENT:  [place]/AT, EOM-wnl, PERRLA, EACs-clear, TMs-wnl, NOSE- red, congested, THROAT- red/ inflammed, mucoid drainage. NECK:  Supple w/ fairROM; no JVD; normal carotid impulses w/o bruits; no thyromegaly or nodules palpated; no lymphadenopathy. CHEST:  Rhonchi R>L w/ some exp wheezing; no rales or signs of consolidation... HEART:  irreg w/ more rapid response, without murmurs/ rubs/ or gallops heard... ABDOMEN:  Soft & nontender; normal bowel sounds; no organomegaly or masses detected. EXT: without deformities, mild arthritic changes; no varicose veins/ +venous insuffic/ no edema. NEURO:  CN's intact; no focal neuro deficits... DERM:  No lesions noted; no rash etc...  RADIOLOGY DATA:  Reviewed in the EPIC EMR & discussed w/ the patient...  LABORATORY DATA:  Reviewed in the EPIC EMR & discussed w/ the patient...   Assessment & Plan:    Atrial  Fibrillation>  On Coumadin, Metoprolol, Diltiazem; rate control strategy w/ improved parameters...  Ven Insuffic>  Doing satis w/o signif edema...  Hyperlipid> stable on Simva20 w/ excellent FLP...  GI> GERD, Divertics, AVMs>  Followed by [name] & stable on Prilosec & stool softeners...  S/P Hosp 7/13 for Abd pain found to be due to SMA embolus=> s/p embolectomy etc; subseq intra-abd hematoma ELap surg, etc... Now much improved & stable on Coumadin via CC...  DJD>  She uses OTC analgesics prn...  Osteoporosis>  BMDs followed by her GYN [name]; she's refused Bisphos Rx- on calcium, MVI, Vit D...  Anxiety>  On Alprazolam 0.5mg  prn...  MACULAR DEGEN, Cataracts>  Followed by [facility] in [place] clinic...   Patient's Medications  New Prescriptions   No medications on file  Previous Medications   ALPRAZOLAM (XANAX) 0.5 MG TABLET    Take 0.5 tablets (0.25 mg total) by mouth 3 (three) times daily as needed. For anxiety   CHOLECALCIFEROL (VITAMIN D) 2000 UNITS CAPS    Take 1 capsule by mouth daily.   DILTIAZEM (CARDIZEM CD) 180 MG 24 HR CAPSULE    TAKE ONE CAPSULE BY MOUTH EVERY DAY   METOPROLOL SUCCINATE (TOPROL-XL) 25 MG 24 HR TABLET    Take 1 tablet (25 mg total) by mouth daily.   MULTIPLE VITAMIN (MULTIVITAMIN WITH MINERALS) TABS    Take 1 tablet by mouth daily.   MULTIVITAMIN-LUTEIN (OCUVITE-LUTEIN) CAPS    Take 1 capsule by mouth daily.   OMEPRAZOLE (PRILOSEC) 20 MG CAPSULE    Take 20 mg by mouth daily.   SIMVASTATIN (ZOCOR) 20 MG TABLET    Take 20 mg by mouth at bedtime.    WARFARIN (COUMADIN) 3 MG TABLET    Take 1 tablet (3 mg total) by mouth as directed.  Modified Medications   Modified Medication Previous Medication   SIMVASTATIN (ZOCOR) 20 MG TABLET simvastatin (ZOCOR) 20 MG tablet      TAKE ONE TABLET BY MOUTH AT BEDTIME    TAKE ONE TABLET BY MOUTH AT BEDTIME  Discontinued Medications   CHOLECALCIFEROL (VITAMIN D) 1000 UNITS TABLET    Take 1,000 Units by mouth  daily.

## 2013-05-09 NOTE — Patient Instructions (Addendum)
Today we updated your med list in our EPIC system...    Continue your current medications the same...  Today we gave you the 2014 FLU vaccine...  Call for any questions...  Let's plan a follow up visit in 51mo, sooner if needed for problems.Marland KitchenMarland Kitchen

## 2013-05-21 DIAGNOSIS — Z85820 Personal history of malignant melanoma of skin: Secondary | ICD-10-CM | POA: Diagnosis not present

## 2013-05-21 DIAGNOSIS — L821 Other seborrheic keratosis: Secondary | ICD-10-CM | POA: Diagnosis not present

## 2013-05-21 DIAGNOSIS — D239 Other benign neoplasm of skin, unspecified: Secondary | ICD-10-CM | POA: Diagnosis not present

## 2013-05-21 DIAGNOSIS — D1801 Hemangioma of skin and subcutaneous tissue: Secondary | ICD-10-CM | POA: Diagnosis not present

## 2013-05-21 DIAGNOSIS — D2330 Other benign neoplasm of skin of unspecified part of face: Secondary | ICD-10-CM | POA: Diagnosis not present

## 2013-05-23 ENCOUNTER — Other Ambulatory Visit: Payer: Self-pay | Admitting: Pulmonary Disease

## 2013-05-29 ENCOUNTER — Ambulatory Visit (INDEPENDENT_AMBULATORY_CARE_PROVIDER_SITE_OTHER): Payer: Medicare Other | Admitting: General Practice

## 2013-05-29 DIAGNOSIS — I4891 Unspecified atrial fibrillation: Secondary | ICD-10-CM

## 2013-05-29 DIAGNOSIS — K55059 Acute (reversible) ischemia of intestine, part and extent unspecified: Secondary | ICD-10-CM | POA: Diagnosis not present

## 2013-05-29 DIAGNOSIS — K55069 Acute infarction of intestine, part and extent unspecified: Secondary | ICD-10-CM

## 2013-05-29 DIAGNOSIS — K661 Hemoperitoneum: Secondary | ICD-10-CM

## 2013-05-29 DIAGNOSIS — Z7901 Long term (current) use of anticoagulants: Secondary | ICD-10-CM | POA: Diagnosis not present

## 2013-05-29 DIAGNOSIS — R58 Hemorrhage, not elsewhere classified: Secondary | ICD-10-CM

## 2013-06-12 ENCOUNTER — Ambulatory Visit (INDEPENDENT_AMBULATORY_CARE_PROVIDER_SITE_OTHER): Payer: Medicare Other | Admitting: Pharmacist

## 2013-06-12 DIAGNOSIS — R58 Hemorrhage, not elsewhere classified: Secondary | ICD-10-CM | POA: Diagnosis not present

## 2013-06-12 DIAGNOSIS — I4891 Unspecified atrial fibrillation: Secondary | ICD-10-CM

## 2013-06-12 DIAGNOSIS — K661 Hemoperitoneum: Secondary | ICD-10-CM

## 2013-06-12 DIAGNOSIS — K55059 Acute (reversible) ischemia of intestine, part and extent unspecified: Secondary | ICD-10-CM | POA: Diagnosis not present

## 2013-06-12 DIAGNOSIS — Z7901 Long term (current) use of anticoagulants: Secondary | ICD-10-CM | POA: Diagnosis not present

## 2013-06-12 DIAGNOSIS — K55069 Acute infarction of intestine, part and extent unspecified: Secondary | ICD-10-CM

## 2013-06-26 ENCOUNTER — Ambulatory Visit (INDEPENDENT_AMBULATORY_CARE_PROVIDER_SITE_OTHER): Payer: Medicare Other | Admitting: General Practice

## 2013-06-26 DIAGNOSIS — I4891 Unspecified atrial fibrillation: Secondary | ICD-10-CM

## 2013-06-26 DIAGNOSIS — K55059 Acute (reversible) ischemia of intestine, part and extent unspecified: Secondary | ICD-10-CM | POA: Diagnosis not present

## 2013-06-26 DIAGNOSIS — R58 Hemorrhage, not elsewhere classified: Secondary | ICD-10-CM | POA: Diagnosis not present

## 2013-06-26 DIAGNOSIS — Z7901 Long term (current) use of anticoagulants: Secondary | ICD-10-CM | POA: Diagnosis not present

## 2013-06-26 DIAGNOSIS — K55069 Acute infarction of intestine, part and extent unspecified: Secondary | ICD-10-CM

## 2013-06-26 DIAGNOSIS — K661 Hemoperitoneum: Secondary | ICD-10-CM

## 2013-07-17 ENCOUNTER — Ambulatory Visit (INDEPENDENT_AMBULATORY_CARE_PROVIDER_SITE_OTHER): Payer: Medicare Other | Admitting: General Practice

## 2013-07-17 DIAGNOSIS — Z7901 Long term (current) use of anticoagulants: Secondary | ICD-10-CM | POA: Diagnosis not present

## 2013-07-17 DIAGNOSIS — R58 Hemorrhage, not elsewhere classified: Secondary | ICD-10-CM

## 2013-07-17 DIAGNOSIS — I4891 Unspecified atrial fibrillation: Secondary | ICD-10-CM

## 2013-07-17 DIAGNOSIS — K661 Hemoperitoneum: Secondary | ICD-10-CM

## 2013-07-17 DIAGNOSIS — K55069 Acute infarction of intestine, part and extent unspecified: Secondary | ICD-10-CM

## 2013-07-17 DIAGNOSIS — K55059 Acute (reversible) ischemia of intestine, part and extent unspecified: Secondary | ICD-10-CM

## 2013-07-17 DIAGNOSIS — Z5181 Encounter for therapeutic drug level monitoring: Secondary | ICD-10-CM

## 2013-07-17 LAB — POCT INR: INR: 2

## 2013-07-31 ENCOUNTER — Other Ambulatory Visit: Payer: Self-pay | Admitting: Pulmonary Disease

## 2013-08-13 ENCOUNTER — Ambulatory Visit (INDEPENDENT_AMBULATORY_CARE_PROVIDER_SITE_OTHER): Payer: Medicare Other | Admitting: Pharmacist

## 2013-08-13 DIAGNOSIS — Z7901 Long term (current) use of anticoagulants: Secondary | ICD-10-CM

## 2013-08-13 DIAGNOSIS — I4891 Unspecified atrial fibrillation: Secondary | ICD-10-CM | POA: Diagnosis not present

## 2013-08-13 DIAGNOSIS — R58 Hemorrhage, not elsewhere classified: Secondary | ICD-10-CM

## 2013-08-13 DIAGNOSIS — K661 Hemoperitoneum: Secondary | ICD-10-CM

## 2013-08-13 DIAGNOSIS — K55059 Acute (reversible) ischemia of intestine, part and extent unspecified: Secondary | ICD-10-CM | POA: Diagnosis not present

## 2013-08-13 DIAGNOSIS — K55069 Acute infarction of intestine, part and extent unspecified: Secondary | ICD-10-CM

## 2013-08-14 DIAGNOSIS — H3530 Unspecified macular degeneration: Secondary | ICD-10-CM | POA: Diagnosis not present

## 2013-08-14 DIAGNOSIS — H35369 Drusen (degenerative) of macula, unspecified eye: Secondary | ICD-10-CM | POA: Diagnosis not present

## 2013-08-14 DIAGNOSIS — H35349 Macular cyst, hole, or pseudohole, unspecified eye: Secondary | ICD-10-CM | POA: Diagnosis not present

## 2013-08-18 IMAGING — CR DG CHEST 1V PORT
1 series · 1 of 1 positions shown · non-contrast
Comparison: 09/30/2010.

CLINICAL DATA: Postop abdominal surgery.  Line placement.

PORTABLE CHEST - 1 VIEW

[AP]
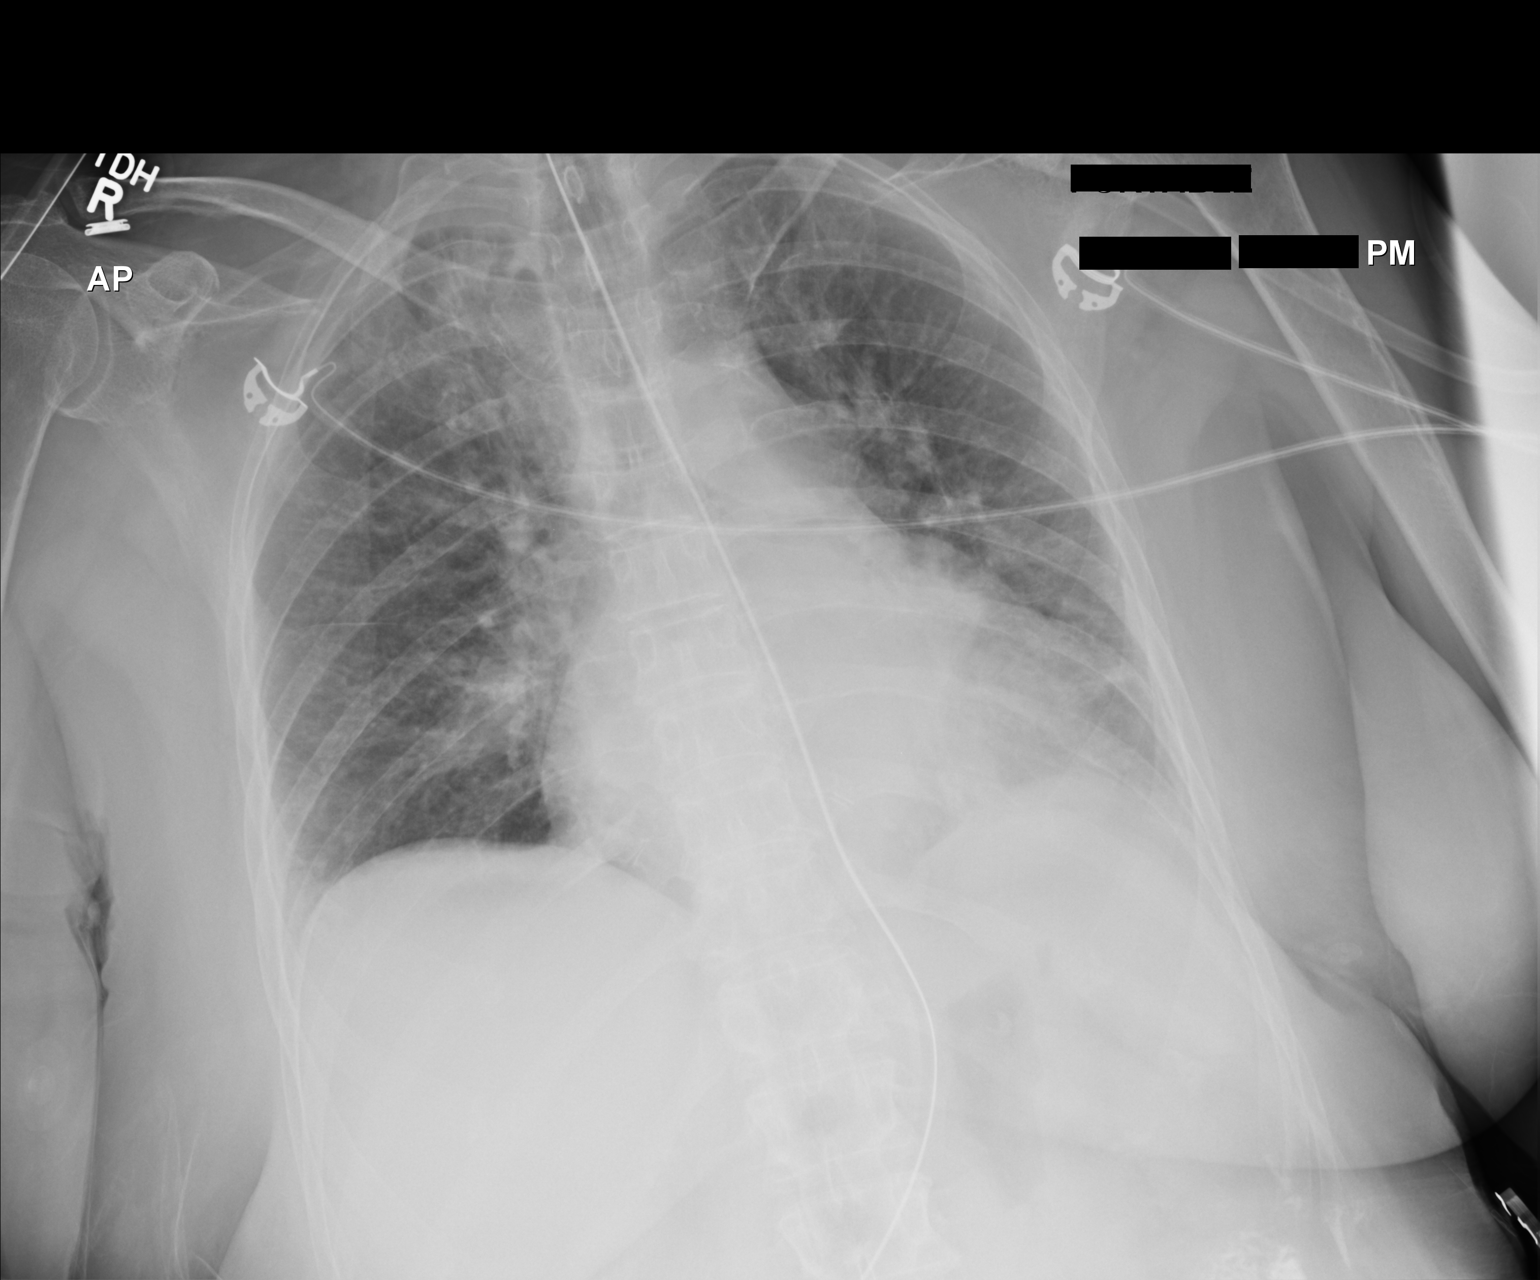

[1 of 1 positions shown; findings below may reference images not displayed]

FINDINGS: 0838 hours.  Nasogastric tube projects below the
diaphragm.  Oxygen tubing and telemetry leads are noted.  No
central venous catheter is visualized.  There are low lung volumes
with mild bibasilar atelectasis.  There is asymmetric density in
the right apex which has increased compared with the prior study.
No pleural effusion or pneumothorax is seen.  Heart size and
mediastinal contours are stable.
IMPRESSION: 1.  No central line visible.  Nasogastric tube projects below the
diaphragm.
2.  Bibasilar atelectasis.  Asymmetric right apical density has
increased from the prior examination and could reflect inflammation
or atelectasis.  Radiographic followup is recommended to exclude a
developing nodule.

## 2013-08-22 IMAGING — CT CT ABD-PELV W/O CM
2 of 4 series · 16 of 46 positions shown, 18 images · non-contrast
Comparison: CT abdomen and pelvis of 03/03/2012

CLINICAL DATA: Anemia, evaluate for possible retroperitoneal bleed,
on anticoagulation

CT ABDOMEN AND PELVIS WITHOUT CONTRAST
TECHNIQUE: Multidetector CT imaging of the abdomen and pelvis was
performed following the standard protocol without intravenous
contrast.

[Series 2: routine abdomen · axial · 0.98mm/px · z∈[-396,+29]mm · 13 of 93 slices shown, 15 images]
[im 4/93  soft-tissue]
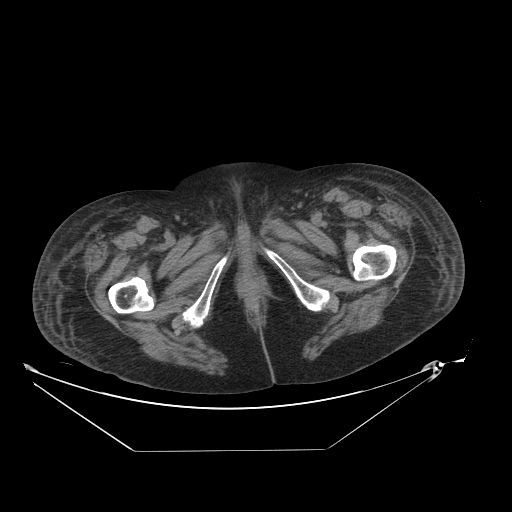
[im 4/93  bone]
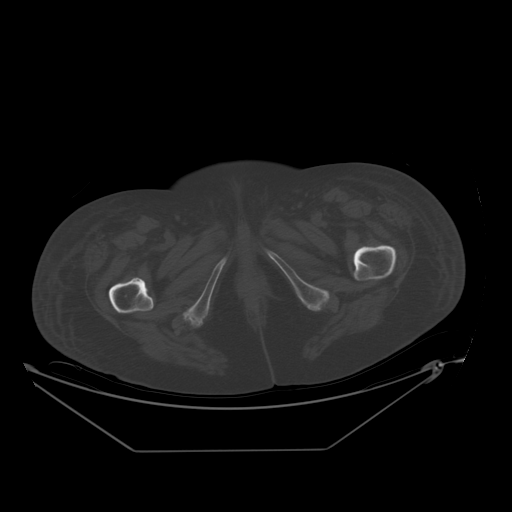
[im 12/93  soft-tissue]
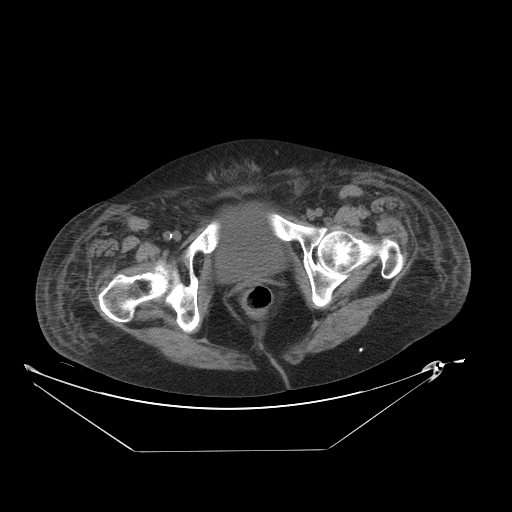
[im 20/93  soft-tissue]
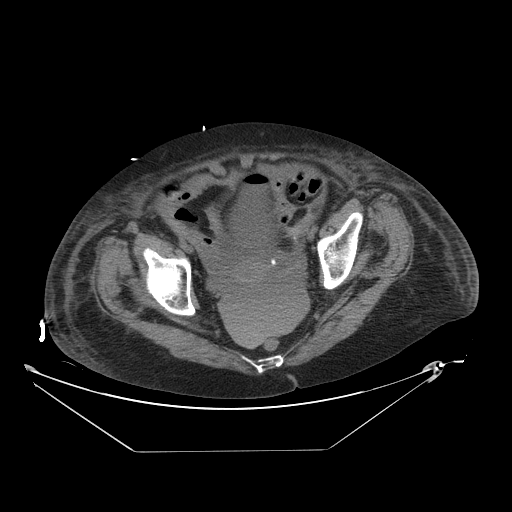
[im 27/93  soft-tissue]
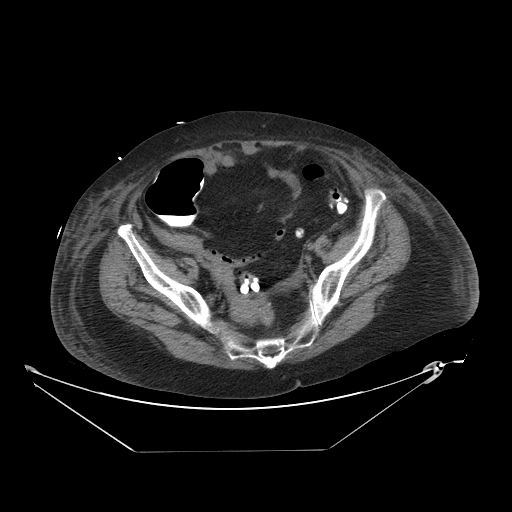
[im 31/93  soft-tissue]
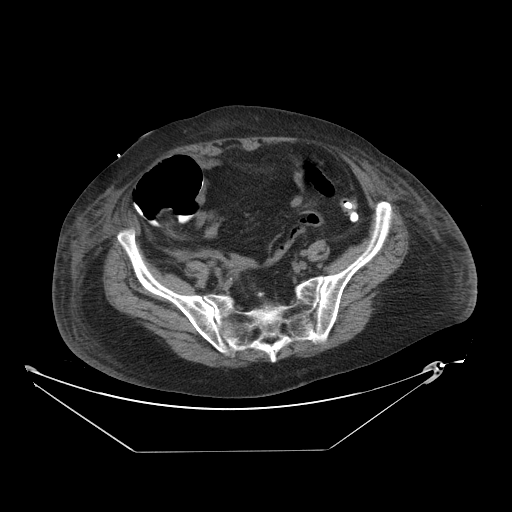
[im 39/93  soft-tissue]
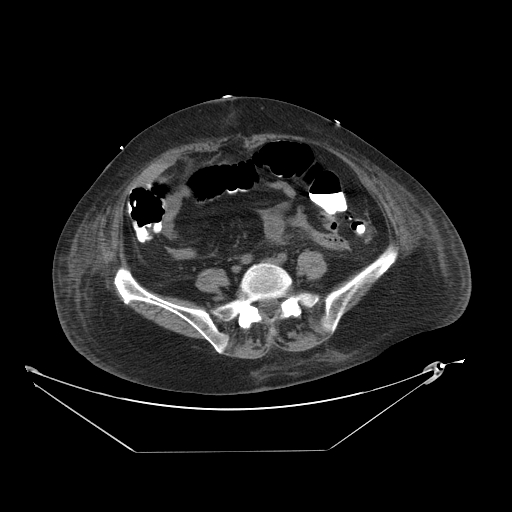
[im 47/93  soft-tissue]
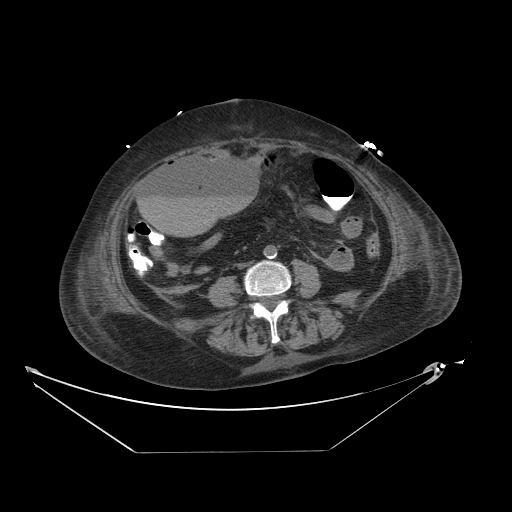
[im 54/93  soft-tissue]
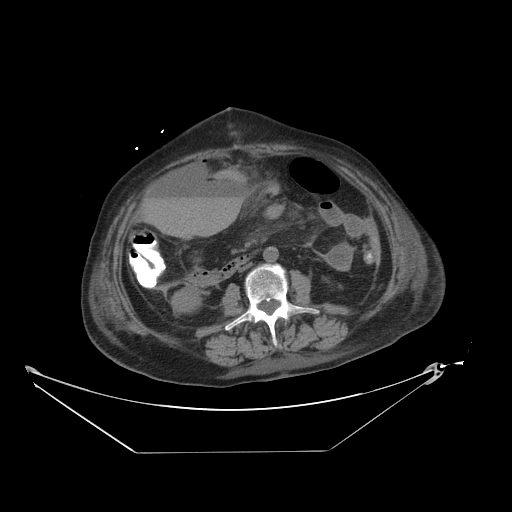
[im 62/93  soft-tissue]
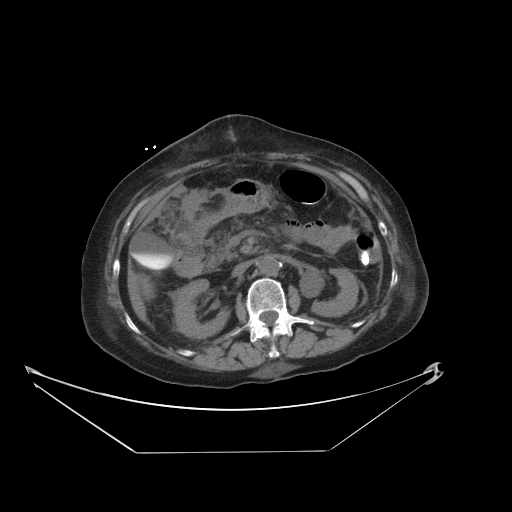
[im 62/93  bone]
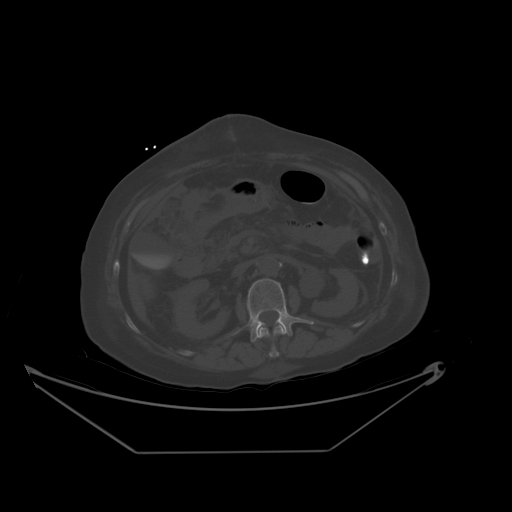
[im 66/93  soft-tissue]
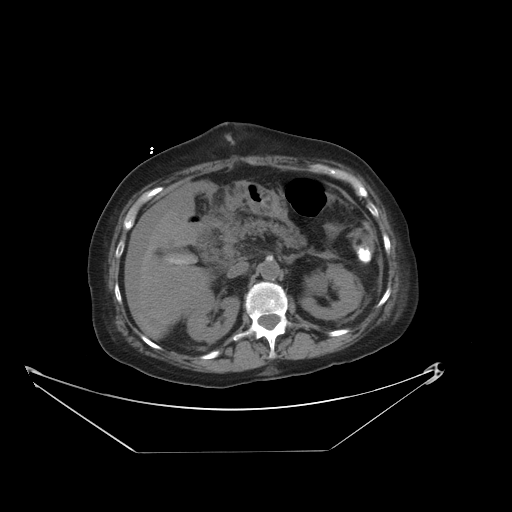
[im 73/93  soft-tissue]
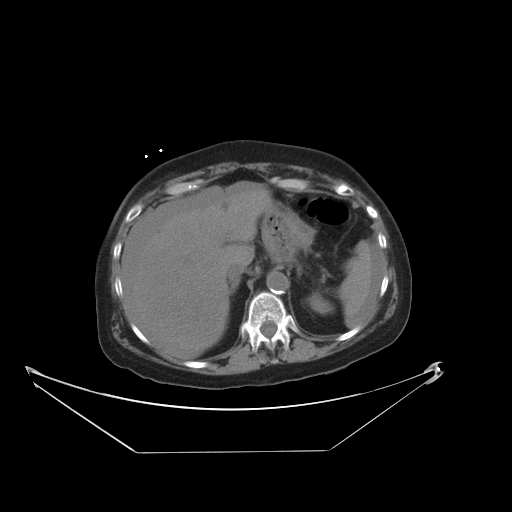
[im 81/93  soft-tissue]
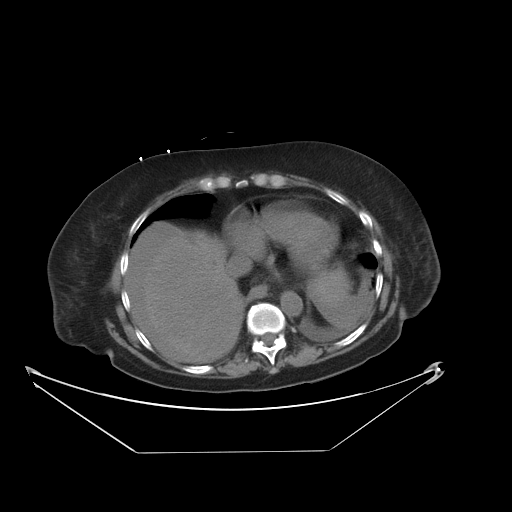
[im 89/93  soft-tissue]
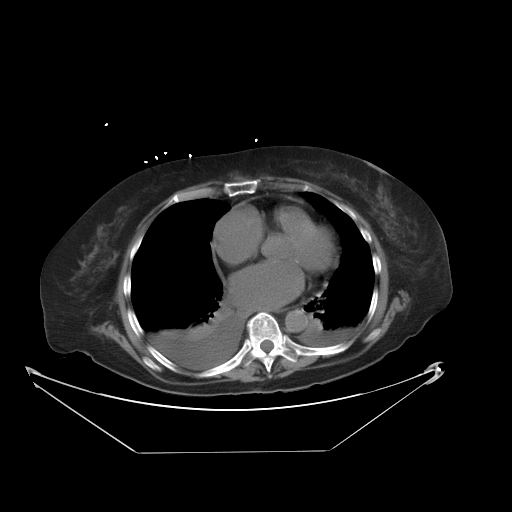

[Series 401: coronal · coronal · 0.98mm/px · 3 of 99 slices shown]
[im 33/99  soft-tissue]
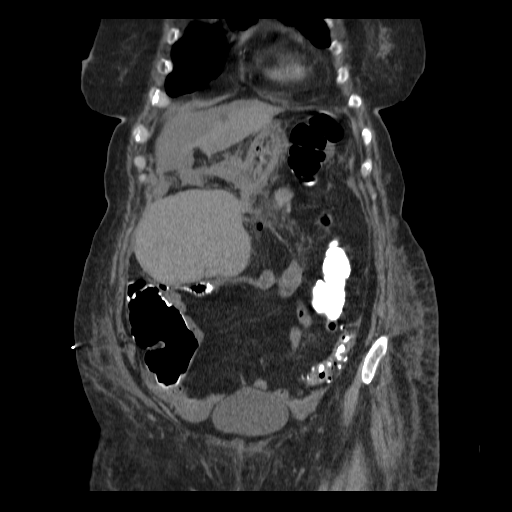
[im 44/99  soft-tissue]
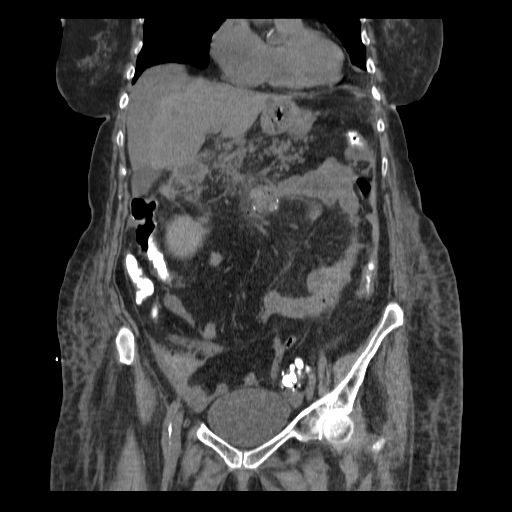
[im 55/99  soft-tissue]
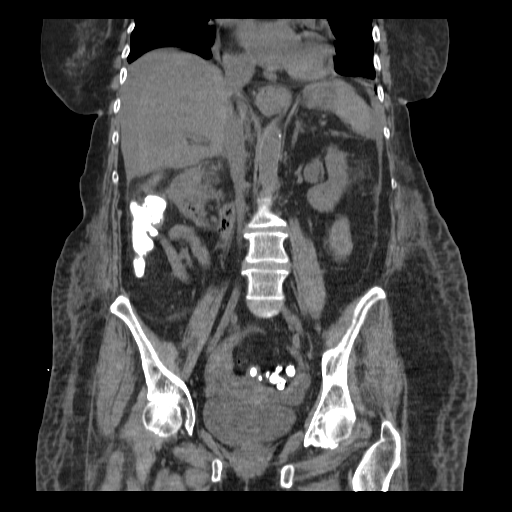

[16 of 46 positions shown; findings below may reference images not displayed]

FINDINGS: There are now small to moderate-sized bilateral pleural
effusions present with basilar atelectasis.  The liver is very
inhomogeneous on this unenhanced study with multiple low
attenuation lesions consistent with cyst.  The periphery of the
liver is somewhat nodular raising the suspicion of cirrhosis with
probable areas of fatty infiltration as well.  There is a ascites
present which is a new finding.  However this peritoneal fluid has
a higher attenuation than expected for ascites and could represent
blood.  Higher attenuation contrast layers within the gallbladder
presumably due to vicarious excretion of contrast.  The pancreas
demonstrates fatty infiltration and the pancreatic duct is not
dilated.  The adrenal glands and spleen are unremarkable other than
some higher attenuation fluid adjacent to the spleen as well.  The
stomach is decompressed.  In the unenhanced state, the kidneys are
unremarkable with an extrarenal pelvis again noted on the left.
The abdominal aorta is normal in caliber.

There is an oval collection of fluid within the anterior right
lower abdomen and upper pelvis with apparent blood serum interface
consistent with acute intraperitoneal hematoma.  In addition
adjacent to the SMA within the midabdomen there are several similar
attenuation collections of fluid presumably representing acute
blood near the  SMA.  A few air bubbles are noted within the
peritoneal cavity as well most likely due to the recent SMA
embolectomy.

Higher attenuation fluid layers dependently within the pelvis as
well consistent with acute intraperitoneal blood. The urinary
bladder is unremarkable containing air most likely due to recent
catheterization.  The uterus appears atrophic.  Multiple
rectosigmoid colonic diverticula are noted.
IMPRESSION: 1.  Intraperitoneal collection of blood with blood serum level
worrisome for acute intraperitoneal bleeding in view of the high
attenuation on this unenhanced study.
2.  Several smaller high attenuation collections within the mid
abdomen near the SMA worrisome for foci of acute bleeding as well.
3.  Higher attenuation fluid consistent with blood throughout the
peritoneal cavity and layering within the pelvis posteriorly.
4.  Small to moderate-sized bilateral pleural effusions.

Critical Value/emergent results were called by telephone at the
time of interpretation on 03/07/2012 at [DATE] p.m. to Dr. Bitar
Iuga, who verbally acknowledged these results.

## 2013-09-04 ENCOUNTER — Ambulatory Visit (INDEPENDENT_AMBULATORY_CARE_PROVIDER_SITE_OTHER): Payer: Medicare Other | Admitting: Pharmacist

## 2013-09-04 DIAGNOSIS — R58 Hemorrhage, not elsewhere classified: Secondary | ICD-10-CM | POA: Diagnosis not present

## 2013-09-04 DIAGNOSIS — I4891 Unspecified atrial fibrillation: Secondary | ICD-10-CM | POA: Diagnosis not present

## 2013-09-04 DIAGNOSIS — K55059 Acute (reversible) ischemia of intestine, part and extent unspecified: Secondary | ICD-10-CM

## 2013-09-04 DIAGNOSIS — K55069 Acute infarction of intestine, part and extent unspecified: Secondary | ICD-10-CM

## 2013-09-04 DIAGNOSIS — Z5181 Encounter for therapeutic drug level monitoring: Secondary | ICD-10-CM | POA: Diagnosis not present

## 2013-09-04 DIAGNOSIS — K661 Hemoperitoneum: Secondary | ICD-10-CM

## 2013-09-04 DIAGNOSIS — Z7901 Long term (current) use of anticoagulants: Secondary | ICD-10-CM | POA: Diagnosis not present

## 2013-09-04 LAB — POCT INR: INR: 2.1

## 2013-10-17 ENCOUNTER — Ambulatory Visit (INDEPENDENT_AMBULATORY_CARE_PROVIDER_SITE_OTHER): Payer: Medicare Other | Admitting: *Deleted

## 2013-10-17 DIAGNOSIS — K55059 Acute (reversible) ischemia of intestine, part and extent unspecified: Secondary | ICD-10-CM | POA: Diagnosis not present

## 2013-10-17 DIAGNOSIS — K55069 Acute infarction of intestine, part and extent unspecified: Secondary | ICD-10-CM

## 2013-10-17 DIAGNOSIS — Z7901 Long term (current) use of anticoagulants: Secondary | ICD-10-CM

## 2013-10-17 DIAGNOSIS — K661 Hemoperitoneum: Secondary | ICD-10-CM

## 2013-10-17 DIAGNOSIS — R58 Hemorrhage, not elsewhere classified: Secondary | ICD-10-CM

## 2013-10-17 DIAGNOSIS — I4891 Unspecified atrial fibrillation: Secondary | ICD-10-CM | POA: Diagnosis not present

## 2013-10-17 LAB — POCT INR: INR: 2.2

## 2013-11-06 ENCOUNTER — Telehealth: Payer: Self-pay | Admitting: Pulmonary Disease

## 2013-11-06 ENCOUNTER — Other Ambulatory Visit (INDEPENDENT_AMBULATORY_CARE_PROVIDER_SITE_OTHER): Payer: Medicare Other

## 2013-11-06 ENCOUNTER — Encounter: Payer: Self-pay | Admitting: Pulmonary Disease

## 2013-11-06 ENCOUNTER — Ambulatory Visit (INDEPENDENT_AMBULATORY_CARE_PROVIDER_SITE_OTHER): Payer: Medicare Other | Admitting: Pulmonary Disease

## 2013-11-06 VITALS — BP 124/76 | HR 86 | Temp 97.0°F | Ht 65.5 in | Wt 158.4 lb

## 2013-11-06 DIAGNOSIS — Z7901 Long term (current) use of anticoagulants: Secondary | ICD-10-CM | POA: Diagnosis not present

## 2013-11-06 DIAGNOSIS — K5730 Diverticulosis of large intestine without perforation or abscess without bleeding: Secondary | ICD-10-CM

## 2013-11-06 DIAGNOSIS — K55069 Acute infarction of intestine, part and extent unspecified: Secondary | ICD-10-CM

## 2013-11-06 DIAGNOSIS — E785 Hyperlipidemia, unspecified: Secondary | ICD-10-CM | POA: Diagnosis not present

## 2013-11-06 DIAGNOSIS — K219 Gastro-esophageal reflux disease without esophagitis: Secondary | ICD-10-CM

## 2013-11-06 DIAGNOSIS — F411 Generalized anxiety disorder: Secondary | ICD-10-CM

## 2013-11-06 DIAGNOSIS — K55059 Acute (reversible) ischemia of intestine, part and extent unspecified: Secondary | ICD-10-CM

## 2013-11-06 DIAGNOSIS — M1990 Unspecified osteoarthritis, unspecified site: Secondary | ICD-10-CM

## 2013-11-06 DIAGNOSIS — I4891 Unspecified atrial fibrillation: Secondary | ICD-10-CM | POA: Diagnosis not present

## 2013-11-06 DIAGNOSIS — M810 Age-related osteoporosis without current pathological fracture: Secondary | ICD-10-CM

## 2013-11-06 DIAGNOSIS — K5520 Angiodysplasia of colon without hemorrhage: Secondary | ICD-10-CM

## 2013-11-06 LAB — CBC WITH DIFFERENTIAL/PLATELET
BASOS ABS: 0 10*3/uL (ref 0.0–0.1)
BASOS PCT: 0.4 % (ref 0.0–3.0)
EOS ABS: 0.2 10*3/uL (ref 0.0–0.7)
Eosinophils Relative: 2.6 % (ref 0.0–5.0)
HCT: 45.5 % (ref 36.0–46.0)
HEMOGLOBIN: 15 g/dL (ref 12.0–15.0)
LYMPHS ABS: 1.9 10*3/uL (ref 0.7–4.0)
Lymphocytes Relative: 29.7 % (ref 12.0–46.0)
MCHC: 32.9 g/dL (ref 30.0–36.0)
MCV: 92.2 fl (ref 78.0–100.0)
MONO ABS: 0.6 10*3/uL (ref 0.1–1.0)
Monocytes Relative: 10 % (ref 3.0–12.0)
NEUTROS ABS: 3.6 10*3/uL (ref 1.4–7.7)
Neutrophils Relative %: 57.3 % (ref 43.0–77.0)
Platelets: 225 10*3/uL (ref 150.0–400.0)
RBC: 4.94 Mil/uL (ref 3.87–5.11)
RDW: 14.4 % (ref 11.5–14.6)
WBC: 6.3 10*3/uL (ref 4.5–10.5)

## 2013-11-06 LAB — BASIC METABOLIC PANEL
BUN: 20 mg/dL (ref 6–23)
CALCIUM: 9.2 mg/dL (ref 8.4–10.5)
CHLORIDE: 100 meq/L (ref 96–112)
CO2: 27 meq/L (ref 19–32)
CREATININE: 0.8 mg/dL (ref 0.4–1.2)
GFR: 69.67 mL/min (ref >60.00)
Glucose, Bld: 94 mg/dL (ref 70–99)
Potassium: 4.3 mEq/L (ref 3.5–5.1)
SODIUM: 138 meq/L (ref 135–145)

## 2013-11-06 LAB — HEPATIC FUNCTION PANEL
ALK PHOS: 64 U/L (ref 39–117)
ALT: 22 U/L (ref 0–35)
AST: 27 U/L (ref 0–37)
Albumin: 4.2 g/dL (ref 3.5–5.2)
BILIRUBIN DIRECT: 0.1 mg/dL (ref 0.0–0.3)
BILIRUBIN TOTAL: 0.7 mg/dL (ref 0.3–1.2)
Total Protein: 7.4 g/dL (ref 6.0–8.3)

## 2013-11-06 LAB — PROTIME-INR
INR: 2.2 ratio — ABNORMAL HIGH (ref 0.8–1.0)
Prothrombin Time: 22.6 s — ABNORMAL HIGH (ref 10.2–12.4)

## 2013-11-06 LAB — LIPID PANEL
CHOL/HDL RATIO: 4
Cholesterol: 198 mg/dL (ref 0–200)
HDL: 55.7 mg/dL (ref >39.00)
LDL CALC: 116 mg/dL — AB (ref 0–99)
TRIGLYCERIDES: 130 mg/dL (ref 0.0–149.0)
VLDL: 26 mg/dL (ref 0.0–40.0)

## 2013-11-06 LAB — TSH: TSH: 2.71 u[IU]/mL (ref 0.35–5.50)

## 2013-11-06 MED ORDER — ALPRAZOLAM 0.5 MG PO TABS: 0.2500 mg | ORAL_TABLET | Freq: Three times a day (TID) | ORAL | Status: DC | PRN

## 2013-11-06 NOTE — Patient Instructions (Signed)
Today we updated your med list in our EPIC system...    Continue your current medications the same...  Today we did your follow up FASTING blood work...    We will contact you w/ the results when available...   Call for any questions or if we can be of service in any way.Marland KitchenMarland Kitchen

## 2013-11-06 NOTE — Progress Notes (Signed)
Subjective:    Patient ID: Lisa Douglas, female    DOB: 1928-06-22, 80 y.o.   MRN: [id number]  HPI 80 y/o WF here for a follow up visit... he has multiple medical problems as noted below...   ~  October 05, 2011:  11mo ROV & she is stable w/ mult chronic complaints> notes gas, belching, occas nausea on & off x yrs; on Prilosec 20mg /d, "nothing bothering me now"; also loose stool x1 that was yellow, now gone & hasn't recurred; we discussed taking Align & Activia...  We reviewed her meds & full labs done 8/12>>    Cardiac stable w/ chr AFib on ASA, Cardizem & Metoprolol> BP= 126/74 & feels well w/o HA, visual symptoms, CP, palpit, ch in SOB, edema, etc; not on Coumadin due to [place] & GIB; she sees [name] once a yr & she's been stable...    We reviewed her Osteoporosis, prev followed by [name] but she doesn't think she needs any further GYN checks; she has repeatedly refused any & all osteoporosis rx; again rec that she consider Alendronate or Atelvia orally or Reclast by infusion but she declines...    We reviewed meds & prev labs; she requests Rx for Shingles vaccine and refill Alprazolam...  ~  March 13,2013:  44mo ROV & add-on for Acute URI symptoms> c/o 4-5d hx "cold" w/ sneezing, head congestion, sinus draining, then cough, low grade temp, clear phlegm, sore from the coughing & can't rest; she has tried OTC Tylenol & nasal saline but no relief; exam shows T99.4, red inflammed pharynx, nasal congestion & drainage, chest rhonchi & mild exp wheezing; we discussed treatment w/ Rest, Tylenol, MucinexDM, Fluids, and ZPak/ Depo/ Pred taper/ & Tussionex...    Other problems including her AFib, Chol, Gerd, DJD, etc are essentially stable- continue same meds...  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    She is requesting a pain piull for her c/o LBP & we agreed to Tramadol prn; her energy is basically pretty good- "I get tired" she says; BP= 112/62 on Metoprolol25mg -3Bid (75mg Bid) and Diltiazem240mg /d; she remains in AFib w/ controlled rate on these meds; other meds as listed remain the same...    We reviewed prob list, meds, xrays and labs> see below for updates >>  ~  April 26, 2012:  13mo ROV & Huma is improved, getting her appetite back & feeling better;  She had f/u appt w/ DrDickson 8/14- s/p SMA embolectomy on Coumadin in the CC now & he released her...  She went to the ER w/ rash on legs- dermatitis & given Pred but she stopped it )"made me nervous"); we decided to treat w/ Lidex-E cream...  She is still c/o weakness & wants to blame her meds> we went  round & round about poss adjustments but she is very specific about what she will & will not take, therefore we ultimately left everything the same... She cut he Metoprolol on her own down to 2tabsBid (& will not take the XL 100 or 50mg  doses)...     We reviewed prob list, meds, xrays and labs> see below>> LABS 9/13:  FLP- at goals on Simva20;  Chems- wnl;  CBC- wnl;  TSH=2.31;  Fe=53 & 15%sat...   ~  July 27, 2012:  45mo ROV & Brizza is quite set in her ways & adamant that she does not want Metop25-2Bid ("it makes me too weak") and insists on ToprolXL25mg /d... We reviewed the following problems today:    AFib> on Metoprolol as above, Dilacor180, Coumadin via CC; BP=118/72, pulse 83 &  irreg; she denies CP, palpit, ch in SOB, edema, etc...    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    Chol> on Simva20; last FLP 9/13 showed TChol 155, TG 142, HDL 54, LDL 72; continue same...    GI- GERD, Divertics, Angiodysplasia> on Prilosec20; she has some intermit abd discomfort, denies n/v c/d or blood seen...    DJD, Osteoporosis> on Calcium, MVI, VitD2000; uses Tylenol for pain...    Anxiety> on Xanax 0.5mg  prn and she copes very well... We reviewed prob list, meds, xrays and labs> see below for updates >> she had the 2013 Flu vaccine in Oct...  ~  November 03, 2012:  9mo ROV & Vanissa reports stable on the MetopER25/d- Hx AFib controlled , BP controlled, feeling well... Her CC today is leg discomfort- mostly nocturnal, muscle cramping, etc & we discussed Rx w/ Tonic water, magnesium, etc... She also notes that "my chart" told her she needs a pneumovax...     We reviewed prob list, meds, xrays and labs> see below for updates >>  LABS 3/14:  FLP- at goals on Simva20;  Chems- wnl;  CBC- wnl;  TSH=2.57;  VitD=40   ~  May 09, 2013:  64mo ROV & Yelitza reports a gfood interval w/o new complaints or concerns... We reviewed the following medical problems during today's office visit >>     AFib> on MetoprololER25, Dilacor180, Coumadin via CC; BP=120/62, pulse 85 & irreg; she denies CP, palpit, ch in SOB, edema, etc...    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    Chol> on Simva20; last FLP 3/14 showed TChol 161, TG 111, HDL 54, LDL 85; continue same...    GI- GERD, Divertics, Angiodysplasia> on Prilosec20; she has some intermit abd discomfort, denies n/v c/d or blood seen; she wants to switch back to [place]- ok...Marland KitchenMarland KitchenMarland Kitchen    DJD, Osteoporosis> on Calcium, MVI, VitD2000; VitD level 4/14 = 40; uses Tylenol for  pain...    Anxiety> on Xanax 0.5mg  prn and she copes very well... We reviewed prob list, meds, xrays and labs> see below for updates >> Given the 2014 Flu shot today...  ~  November 06, 2013:  68mo ROV & Tamula reports stable, doing satis at 15, we reviewed the following medical problems during today's office visit >>     AFib> on MetoprololER25, Dilacor180, Coumadin via CC; BP=124/76, pulse 85 & irreg; she denies CP, palpit, ch in SOB, edema, etc...    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    Chol> on Simva20; last FLP 3/15 showed TChol 198, TG 111, HDL 56, LDL 116; continue same...    GI- GERD, Divertics, Angiodysplasia> on Nexium20Bid OTC; she has some intermit abd discomfort, denies n/v c/d or blood seen...    DJD, Osteoporosis> on Calcium, MVI, VitD2000; VitD level 4/14 = 40; uses Tylenol for pain...    Anxiety> on Xanax 0.5mg  prn and she copes very well... We reviewed prob list, meds, xrays and labs> see below for updates >>   LABS 3/15:  FLP- ok x LDL=116 on simva20;  Chems- wnl;  CBC- wnl;  TSH=2.71;  Protime=22.6/ INR=2.2 per coumadin clinic...          PROBLEM LIST:    MACULAR DEGENERATION (ICD-362.50) - she's followed in the [facility] Q46mo; on Eye Vits and she states she may need shots in her left eye per [name]; she also reports cataracts which may require surg...  ATRIAL FIBRILLATION, CHRONIC (ICD-427.31) - on ASA 81mg /d; rate control w/ METOPROLOL & DILTIAZEM; now on Coumadin since 7/13 Hosp for SMA embolus & surg (prev not felt to be a Coumadin candidate due to colonoc AVMs); note- she had intra-abd hematoma requiring a second operation/ ELap during the 7/13 Hosp... ~  Cards f/u by [name] yearly- doing well, no changes, made... ~  EKG 3/13 showed Fib/flutter w/ variable conduction & NSSTTWA... ~  [facility] 7/13 for Abd pain, SMA embolectomy, started on anticoagulation, subseq  re-op/ ELap for intra-abd hematoma; AFib meds adjusted=> Metoprolol75mg Bid & Diltiazen240mg /d=> later decr to 180mg /d... ~  12/13: she refuses to take the Metop25-2Bid & insists on TOPROL XL 25mg /d-OK... ~  3/14: on MetopER25, DiltiazemXR180, Coumadin via CC; BP= 120/70 & she denies CP, palpit, SOB, edema... 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  VENOUS INSUFFICIENCY (ICD-459.81) - she follows a low sodium diet... not on diuretic therapy.  HYPERLIPIDEMIA (ICD-272.4) - on SIMVASTATIN 20mg /d... ~  FLP was 6/08 showing TChol 135, TG 90, HDL 56, LDL 61... ~  [place] 2/09 showed TChol 140, TG 99, HDL 59, LDL 62... great! ~  FLP 2/10 on [place]10/20 showed TChol 133, TG 83, HDL 56, LDL 60... rec> change to Simva20. ~  FLP 8/10 on Simva20 showed TChol 143, TG 99, HDL 51, LDL 73 ~  FLP 8/11 on Simva20 showed TChol 147, TG 103, HDL 54, LDL 72 ~  FLP 8/12 on Simva20 showed TChol 142, TG 106, HDL 63, LDL 58 ~  FLP 9/13 on Simva20 showed TChol 155, TG 142, HDL 54, LDL 72 ~  FLP 3/14 on Simva20 showed TChol 161, TG 111,  HDL 54, LDL 85  GERD (ICD-530.81) - she takes PRILOSEC 20mg /d and remains asymptomatic...  DIVERTICULOSIS  OF COLON (ICD-562.10) - no abd pain or episodes of diverticulitis; she uses stool softeners for constipation... ANGIODYSPLASIA, COLON [id number]) - last colonoscopy was 10/05 by [name] showing divertics and colon AVMs... ~  CT Abd & Pelvis 4/14 showed mild cardiomeg, 60mm subpleural nodule LLL, coronary calcif, hypodensities in liver (cysts), sm right adrenal nodule, bilat renal cysts, diverticulosis, smHH, ventral hernia, atherosclerotic calcif in Ao, multilevel degen changes in spine.  DEGENERATIVE JOINT DISEASE (ICD-715.90) - only c/o some hand pain... uses OTC meds as needed... her main exercise is "up and down the steps 40-11 times a day"...  OSTEOPOROSIS (ICD-733.00) - prev took Actonel but stopped on her own December 20, 2008...  takes Ca++, MVI, VitD OTC... her last BMD was 3/08 by Elmon Else for GYN and he follows this (she gets BMD and Mammograms at his office)... she states INTOL to Alendronate "it made me sick" & refuses to restart meds for her bones (either pills, shots, or IV Rx)... ~  labs 2/10 showed Vit D level = 29... rec> add Vit D 1000 u OTC daily. ~  labs 8/10 showed Vit D level = 31... rec> continue 1000 u daily. ~  BMD 10/11 by [name] showed TScores -2.5 in Spine, and -2.4 in left FemNeck... ~  2/12:  Reminded of the importance of bisphos or other therapy for osteoporosis but she continues to decline treatment... ~  2/13:  Asked to reconsider treatment for her osteoporosis but she declines; asked her to discuss w/ her GYN (they do her BMDs)... ~  Labs 3/14 showed VitD level = 40; rec to continue VitD supplement ~2000u daily...  ANXIETY (ICD-300.00) - she has ALPRAZOLAM 0.5mg  - 1/2 to 1 tab Tid Prn... ~  Stress level decr since Rod Holler passed away Dec 20, 2008 & caregiver roll is diminished...   Past Surgical History  Procedure Laterality Date  . Appendectomy  1951  . Tonsillectomy  December 20, 1941  . Laparotomy  03/07/2012    Procedure: EXPLORATORY LAPAROTOMY;  Surgeon: Angelia Mould, MD;   Location: [facility] OR;  Service: Vascular;  Laterality: N/A;  Exploratory Laparotomy with Evacuation of Hematoma.    Outpatient Encounter Prescriptions as of 11/06/2013  Medication Sig  . ALPRAZolam (XANAX) 0.5 MG tablet Take 0.5 tablets (0.25 mg total) by mouth 3 (three) times daily as needed. For anxiety  . Cholecalciferol (VITAMIN D) 2000 UNITS CAPS Take 1 capsule by mouth daily.  Marland Kitchen diltiazem (CARDIZEM CD) 180 MG 24 hr capsule TAKE ONE CAPSULE BY MOUTH EVERY DAY  . esomeprazole (NEXIUM) 20 MG capsule Take 20 mg by mouth daily at 12 noon.  . metoprolol succinate (TOPROL-XL) 25 MG 24 hr tablet TAKE ONE TABLET BY MOUTH DAILY  . Multiple Vitamin (MULTIVITAMIN WITH MINERALS) TABS Take 1 tablet by mouth daily.  . multivitamin-lutein (OCUVITE-LUTEIN) CAPS Take 1 capsule by mouth daily.  . simvastatin (ZOCOR) 20 MG tablet TAKE ONE TABLET BY MOUTH AT BEDTIME  . warfarin (COUMADIN) 3 MG tablet Take 1 tablet (3 mg total) by mouth as directed.  . [DISCONTINUED] omeprazole (PRILOSEC) 20 MG capsule Take 20 mg by mouth daily.    Allergies  Allergen Reactions  . Penicillins     angioedema  . Alendronate Sodium     REACTION: INTOL to Fosamax per pt.  . Atorvastatin     REACTION: INTOL to Lipitor per pt.  . Morphine And Related     Makes her mean per  pt    Current Medications, Allergies, Past Medical History, Past Surgical History, Family History, and Social History were reviewed in [employer] record.    Review of Systems    See HPI - all other systems neg except as noted...  The patient complains of dyspnea on exertion.  The patient denies anorexia, fever, weight loss, weight gain, vision loss, decreased hearing, hoarseness, chest pain, syncope, peripheral edema, prolonged cough, headaches, hemoptysis, abdominal pain, melena, hematochezia, severe indigestion/heartburn, hematuria, incontinence, muscle weakness, suspicious skin lesions, transient blindness, difficulty walking,  depression, unusual weight change, abnormal bleeding, enlarged lymph nodes, and angioedema.    Objective:   Physical Exam     WD, WN, 80 y/o WF in NAD... GENERAL:  Alert & oriented; pleasant & cooperative... HEENT:  [place]/AT, EOM-wnl, PERRLA, EACs-clear, TMs-wnl, NOSE- red, congested, THROAT- red/ inflammed, mucoid drainage. NECK:  Supple w/ fairROM; no JVD; normal carotid impulses w/o bruits; no thyromegaly or nodules palpated; no lymphadenopathy. CHEST:  Rhonchi R>L w/ some exp wheezing; no rales or signs of consolidation... HEART:  irreg w/ more rapid response, without murmurs/ rubs/ or gallops heard... ABDOMEN:  Soft & nontender; normal bowel sounds; no organomegaly or masses detected. EXT: without deformities, mild arthritic changes; no varicose veins/ +venous insuffic/ no edema. NEURO:  CN's intact; no focal neuro deficits... DERM:  No lesions noted; no rash etc...  RADIOLOGY DATA:  Reviewed in the EPIC EMR & discussed w/ the patient...  LABORATORY DATA:  Reviewed in the EPIC EMR & discussed w/ the patient...   Assessment & Plan:    Atrial Fibrillation>  On Coumadin, Metoprolol, Diltiazem; rate control strategy w/ improved parameters...  Ven Insuffic>  Doing satis w/o signif edema...  Hyperlipid> stable on Simva20 w/ excellent FLP...  GI> GERD, Divertics, AVMs>  Followed by [name] & stable on Prilosec & stool softeners...  S/P Hosp 7/13 for Abd pain found to be due to SMA embolus=> s/p embolectomy etc; subseq intra-abd hematoma ELap surg, etc... Now much improved & stable on Coumadin via CC...  DJD>  She uses OTC analgesics prn...  Osteoporosis>  BMDs followed by her GYN [name]; she's refused Bisphos Rx- on calcium, MVI, Vit D...  Anxiety>  On Alprazolam 0.5mg  prn...  MACULAR DEGEN, Cataracts>  Followed by [facility] in [place] clinic.Marland KitchenMarland Kitchen

## 2013-11-06 NOTE — Telephone Encounter (Signed)
i called and spoke with pt. She reports she is going to back to see her cardiologists since SN is retiring from [facility]. She is still thinking about who she wants to see. Nothing further needed

## 2013-11-09 ENCOUNTER — Ambulatory Visit (INDEPENDENT_AMBULATORY_CARE_PROVIDER_SITE_OTHER): Payer: Medicare Other | Admitting: Pharmacist

## 2013-11-09 DIAGNOSIS — Z7901 Long term (current) use of anticoagulants: Secondary | ICD-10-CM

## 2013-11-09 DIAGNOSIS — I4891 Unspecified atrial fibrillation: Secondary | ICD-10-CM

## 2013-11-09 DIAGNOSIS — K55059 Acute (reversible) ischemia of intestine, part and extent unspecified: Secondary | ICD-10-CM

## 2013-11-09 DIAGNOSIS — K55069 Acute infarction of intestine, part and extent unspecified: Secondary | ICD-10-CM

## 2013-12-02 ENCOUNTER — Other Ambulatory Visit: Payer: Self-pay | Admitting: Pulmonary Disease

## 2013-12-04 ENCOUNTER — Ambulatory Visit (INDEPENDENT_AMBULATORY_CARE_PROVIDER_SITE_OTHER): Payer: Medicare Other | Admitting: *Deleted

## 2013-12-04 DIAGNOSIS — Z7901 Long term (current) use of anticoagulants: Secondary | ICD-10-CM

## 2013-12-04 DIAGNOSIS — R58 Hemorrhage, not elsewhere classified: Secondary | ICD-10-CM

## 2013-12-04 DIAGNOSIS — I4891 Unspecified atrial fibrillation: Secondary | ICD-10-CM | POA: Diagnosis not present

## 2013-12-04 DIAGNOSIS — K55059 Acute (reversible) ischemia of intestine, part and extent unspecified: Secondary | ICD-10-CM | POA: Diagnosis not present

## 2013-12-04 DIAGNOSIS — K661 Hemoperitoneum: Secondary | ICD-10-CM

## 2013-12-04 DIAGNOSIS — K55069 Acute infarction of intestine, part and extent unspecified: Secondary | ICD-10-CM

## 2013-12-04 LAB — POCT INR: INR: 2.1

## 2013-12-05 ENCOUNTER — Other Ambulatory Visit: Payer: Self-pay | Admitting: Pulmonary Disease

## 2013-12-05 DIAGNOSIS — Z1231 Encounter for screening mammogram for malignant neoplasm of breast: Secondary | ICD-10-CM

## 2013-12-31 ENCOUNTER — Other Ambulatory Visit: Payer: Self-pay | Admitting: Pulmonary Disease

## 2014-01-01 ENCOUNTER — Ambulatory Visit (INDEPENDENT_AMBULATORY_CARE_PROVIDER_SITE_OTHER): Payer: Medicare Other | Admitting: Pharmacist Clinician (PhC)/ Clinical Pharmacy Specialist

## 2014-01-01 DIAGNOSIS — R58 Hemorrhage, not elsewhere classified: Secondary | ICD-10-CM | POA: Diagnosis not present

## 2014-01-01 DIAGNOSIS — K661 Hemoperitoneum: Secondary | ICD-10-CM

## 2014-01-01 DIAGNOSIS — K55059 Acute (reversible) ischemia of intestine, part and extent unspecified: Secondary | ICD-10-CM

## 2014-01-01 DIAGNOSIS — I4891 Unspecified atrial fibrillation: Secondary | ICD-10-CM | POA: Diagnosis not present

## 2014-01-01 DIAGNOSIS — Z7901 Long term (current) use of anticoagulants: Secondary | ICD-10-CM | POA: Diagnosis not present

## 2014-01-01 DIAGNOSIS — K55069 Acute infarction of intestine, part and extent unspecified: Secondary | ICD-10-CM

## 2014-01-01 LAB — POCT INR: INR: 2.5

## 2014-01-08 ENCOUNTER — Ambulatory Visit (HOSPITAL_COMMUNITY): Payer: Medicare Other

## 2014-01-29 ENCOUNTER — Ambulatory Visit (INDEPENDENT_AMBULATORY_CARE_PROVIDER_SITE_OTHER): Payer: Medicare Other | Admitting: *Deleted

## 2014-01-29 DIAGNOSIS — K55069 Acute infarction of intestine, part and extent unspecified: Secondary | ICD-10-CM

## 2014-01-29 DIAGNOSIS — K55059 Acute (reversible) ischemia of intestine, part and extent unspecified: Secondary | ICD-10-CM

## 2014-01-29 DIAGNOSIS — Z7901 Long term (current) use of anticoagulants: Secondary | ICD-10-CM

## 2014-01-29 DIAGNOSIS — R58 Hemorrhage, not elsewhere classified: Secondary | ICD-10-CM

## 2014-01-29 DIAGNOSIS — I4891 Unspecified atrial fibrillation: Secondary | ICD-10-CM

## 2014-01-29 DIAGNOSIS — K661 Hemoperitoneum: Secondary | ICD-10-CM

## 2014-01-29 LAB — POCT INR: INR: 2.1

## 2014-01-30 MED ORDER — WARFARIN SODIUM 3 MG PO TABS: 3.0000 mg | ORAL_TABLET | ORAL | Status: DC

## 2014-02-22 ENCOUNTER — Other Ambulatory Visit: Payer: Self-pay | Admitting: Pulmonary Disease

## 2014-02-26 ENCOUNTER — Ambulatory Visit (INDEPENDENT_AMBULATORY_CARE_PROVIDER_SITE_OTHER): Payer: Medicare Other | Admitting: *Deleted

## 2014-02-26 DIAGNOSIS — K661 Hemoperitoneum: Secondary | ICD-10-CM

## 2014-02-26 DIAGNOSIS — K55069 Acute infarction of intestine, part and extent unspecified: Secondary | ICD-10-CM

## 2014-02-26 DIAGNOSIS — I4891 Unspecified atrial fibrillation: Secondary | ICD-10-CM

## 2014-02-26 DIAGNOSIS — K55059 Acute (reversible) ischemia of intestine, part and extent unspecified: Secondary | ICD-10-CM

## 2014-02-26 DIAGNOSIS — Z7901 Long term (current) use of anticoagulants: Secondary | ICD-10-CM

## 2014-02-26 DIAGNOSIS — R58 Hemorrhage, not elsewhere classified: Secondary | ICD-10-CM

## 2014-02-26 LAB — POCT INR: INR: 1.7

## 2014-03-05 ENCOUNTER — Encounter: Payer: Self-pay | Admitting: *Deleted

## 2014-03-08 DIAGNOSIS — H35349 Macular cyst, hole, or pseudohole, unspecified eye: Secondary | ICD-10-CM | POA: Diagnosis not present

## 2014-03-08 DIAGNOSIS — H3530 Unspecified macular degeneration: Secondary | ICD-10-CM | POA: Diagnosis not present

## 2014-03-12 ENCOUNTER — Ambulatory Visit (INDEPENDENT_AMBULATORY_CARE_PROVIDER_SITE_OTHER): Payer: Medicare Other | Admitting: Cardiology

## 2014-03-12 ENCOUNTER — Ambulatory Visit (INDEPENDENT_AMBULATORY_CARE_PROVIDER_SITE_OTHER): Payer: Medicare Other

## 2014-03-12 ENCOUNTER — Encounter: Payer: Self-pay | Admitting: Cardiology

## 2014-03-12 VITALS — BP 136/76 | HR 99 | Ht 65.5 in | Wt 157.8 lb

## 2014-03-12 DIAGNOSIS — K55069 Acute infarction of intestine, part and extent unspecified: Secondary | ICD-10-CM

## 2014-03-12 DIAGNOSIS — I482 Chronic atrial fibrillation, unspecified: Secondary | ICD-10-CM

## 2014-03-12 DIAGNOSIS — R58 Hemorrhage, not elsewhere classified: Secondary | ICD-10-CM | POA: Diagnosis not present

## 2014-03-12 DIAGNOSIS — K55059 Acute (reversible) ischemia of intestine, part and extent unspecified: Secondary | ICD-10-CM | POA: Diagnosis not present

## 2014-03-12 DIAGNOSIS — K661 Hemoperitoneum: Secondary | ICD-10-CM

## 2014-03-12 DIAGNOSIS — Z7901 Long term (current) use of anticoagulants: Secondary | ICD-10-CM | POA: Diagnosis not present

## 2014-03-12 DIAGNOSIS — I4891 Unspecified atrial fibrillation: Secondary | ICD-10-CM

## 2014-03-12 LAB — POCT INR: INR: 2.3

## 2014-03-12 NOTE — Patient Instructions (Signed)
Your physician recommends that you return for lab work in: 3 months--CBCd  Your physician wants you to follow-up in: 1 year with Dr Aundra Dubin. (July 2016). You will receive a reminder letter in the mail two months in advance. If you don't receive a letter, please call our office to schedule the follow-up appointment.

## 2014-03-13 NOTE — Progress Notes (Signed)
Patient ID: Lisa Douglas, female   DOB: 08-28-1927, 80 y.o.   MRN: [id number] PCP: Dr. Lenna Gilford  80 yo with chronic atrial fibrillation and prior embolic SMA occlusion requiring embolectomy (off coumadin at the time) presents for cardiology followup.  She has been seen by Dr. Haroldine Laws in the past and is seen by me for the first time today.  She has done well since the last appointment.  She has a history of GI bleeding from gastric AVMs but has had no overt GI bleeding recently.  She does not feel palpitations.  She is not particularly active but does not get short of breath with exertion.  She does have some generalized fatigue.  No chest pain.   ECG: atrial fibrillation with septal Qs  Labs (3/15): K 4.3, creatinine 0.8, LDL 116, HDL 56, TSH normal, HCT 45.5  PMH: 1. Hyperlipidemia 2. Gastric AVMs with history of GI bleeding. 3. GERD 4. Atrial fibrillation: Chronic.  She had suspected cardioembolic SMA occlusion in 7/13 requiring SMA embolectomy.  Coumadin begun at this time.  5. Macular degeneration. 6. Diverticulosis. 7. Echo (7/13) with EF 60%, mild MR.   FH: Mother with possible atrial fibrillation.   SH: Nonsmoker, lives in [place] alone.   Current Outpatient Prescriptions  Medication Sig Dispense Refill  . ALPRAZolam (XANAX) 0.5 MG tablet Take 0.5 tablets (0.25 mg total) by mouth 3 (three) times daily as needed. For anxiety  90 tablet  5  . Cholecalciferol (VITAMIN D) 2000 UNITS CAPS Take 1 capsule by mouth daily.      Marland Kitchen diltiazem (CARDIZEM CD) 180 MG 24 hr capsule TAKE ONE CAPSULE BY MOUTH ONCE DAILY  30 capsule  3  . esomeprazole (NEXIUM) 20 MG capsule Take 20 mg by mouth daily at 12 noon.      . metoprolol succinate (TOPROL-XL) 25 MG 24 hr tablet TAKE ONE TABLET BY MOUTH ONCE DAILY  30 tablet  3  . Multiple Vitamin (MULTIVITAMIN WITH MINERALS) TABS Take 1 tablet by mouth daily.      . multivitamin-lutein (OCUVITE-LUTEIN) CAPS Take 1 capsule by mouth daily.      . simvastatin  (ZOCOR) 20 MG tablet TAKE ONE TABLET BY MOUTH AT BEDTIME  30 tablet  0  . warfarin (COUMADIN) 3 MG tablet Take 1 tablet (3 mg total) by mouth as directed.  30 tablet  3  . [DISCONTINUED] diltiazem (DILACOR XR) 180 MG 24 hr capsule Take 1 capsule (180 mg total) by mouth daily.  30 capsule  11   No current facility-administered medications for this visit.   BP 136/76  Pulse 99  Ht 5' 5.5" (1.664 m)  Wt 157 lb 12.8 oz (71.578 kg)  BMI 25.85 kg/m2 General: NAD Neck: No JVD, no thyromegaly or thyroid nodule.  Lungs: Clear to auscultation bilaterally with normal respiratory effort. CV: Nondisplaced PMI.  Heart irregular S1/S2, no S3/S4, no murmur.  No peripheral edema.  No carotid bruit.  Normal pedal pulses.  Abdomen: Soft, nontender, no hepatosplenomegaly, no distention.  Skin: Intact without lesions or rashes.  Neurologic: Alert and oriented x 3.  Psych: Normal affect. Extremities: No clubbing or cyanosis.   Assessment/Plan: 1. Atrial fibrillation: Chronic.  Good rate control on Toprol XL and diltiazem CD.  Given history of embolic SMA occlusion when off warfarin in the past, she will need Lovenox bridge if she needs to stop warfarin in the future.   2. History of gastric AVMs: No bleeding on coumadin so far.  She will  have repeat CBC in 3 months.   Loralie Champagne 03/13/2014

## 2014-03-25 ENCOUNTER — Telehealth: Payer: Self-pay

## 2014-03-25 NOTE — Telephone Encounter (Signed)
Called and LM on pt VM TCB

## 2014-03-25 NOTE — Telephone Encounter (Signed)
Message copied by Angela Adam on Mon Mar 25, 2014  8:13 AM ------      Message from: Marin Olp      Created: Sun Mar 24, 2014  9:47 PM       Patient has an appointment this Wednesday. Please let patient know that I do not prescribe chronic benzodiazepines for anxiety. I am happy to use alternative therapies but will not prescribe xanax on a regular basis.  IF she would like her primary care doctor to prescribe this, then we will need to help her find someone within the [facility] system that will.  ------

## 2014-03-26 ENCOUNTER — Encounter: Payer: Self-pay | Admitting: Family Medicine

## 2014-03-26 ENCOUNTER — Ambulatory Visit (INDEPENDENT_AMBULATORY_CARE_PROVIDER_SITE_OTHER): Payer: Medicare Other | Admitting: Family Medicine

## 2014-03-26 VITALS — BP 110/62 | HR 68 | Temp 97.4°F | Wt 157.0 lb

## 2014-03-26 DIAGNOSIS — K5520 Angiodysplasia of colon without hemorrhage: Secondary | ICD-10-CM

## 2014-03-26 DIAGNOSIS — K55069 Acute infarction of intestine, part and extent unspecified: Secondary | ICD-10-CM

## 2014-03-26 DIAGNOSIS — I4891 Unspecified atrial fibrillation: Secondary | ICD-10-CM

## 2014-03-26 DIAGNOSIS — K55059 Acute (reversible) ischemia of intestine, part and extent unspecified: Secondary | ICD-10-CM | POA: Diagnosis not present

## 2014-03-26 DIAGNOSIS — E785 Hyperlipidemia, unspecified: Secondary | ICD-10-CM | POA: Diagnosis not present

## 2014-03-26 DIAGNOSIS — F411 Generalized anxiety disorder: Secondary | ICD-10-CM

## 2014-03-26 DIAGNOSIS — N182 Chronic kidney disease, stage 2 (mild): Secondary | ICD-10-CM | POA: Insufficient documentation

## 2014-03-26 DIAGNOSIS — I482 Chronic atrial fibrillation, unspecified: Secondary | ICD-10-CM

## 2014-03-26 MED ORDER — SIMVASTATIN 20 MG PO TABS: ORAL_TABLET | ORAL | Status: DC

## 2014-03-26 NOTE — Progress Notes (Signed)
Garret Reddish, MD Phone: [phone]  Subjective:  Patient presents today to establish care. Chief complaint-noted.   Hyperlipidemia Leg cramps On statin: simvastatin 20mg , max dose while  on diltiazem Does have intermittent night leg cramps, possibly related to statin. She has had previous workup with cardiology. No leg cramps with walking.  ROS- no chest pain or shortness of breath. No myalgias.   Atrial Fibrillation History of SMA thrombosis s/p surgical removal Angiodysplasia of colon HR controlled on dilt and metoprolol. Goal inr 2-2.5 typically maintained. Balance b/n bleeding risk of colon with clot risk in a fib and SMA thrombosis history ROS- no abdominal pain, no chest pain or shortness of breath. Denies rectal bleeding.   Anxiety- sometimes before meals for unclear reason. 1/2 tab once every other month resolves symptoms.   The following were reviewed and entered/updated in epic: Past Medical History  Diagnosis Date  . Macular degeneration (senile) of retina, unspecified   . Atrial fibrillation   . Unspecified venous (peripheral) insufficiency   . Other and unspecified hyperlipidemia   . Esophageal reflux   . Diverticulosis of colon (without mention of hemorrhage)   . Angiodysplasia of intestine (without mention of hemorrhage)   . Osteoarthrosis, unspecified whether generalized or localized, unspecified site   . Osteoporosis, unspecified   . Anxiety state, unspecified   . Mesenteric embolus   . 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 Not current problem.     Patient Active Problem List   Diagnosis Date Noted  . ATRIAL FIBRILLATION, CHRONIC 10/03/2007    Priority: High  . CKD (chronic kidney disease), stage II 03/26/2014    Priority: Medium  . 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 ANXIETY 10/03/2007    Priority: Medium  . HYPERLIPIDEMIA 10/02/2007    Priority:  Medium  . ANGIODYSPLASIA, COLON 06/01/2004    Priority: Medium  . Encounter for long-term (current) use of anticoagulants 03/20/2012    Priority: Low  . Leg cramps 12/30/2011    Priority: Low  . Vitamin D deficiency 04/01/2011    Priority: Low  . MACULAR DEGENERATION 10/03/2007    Priority: Low  . VENOUS INSUFFICIENCY 10/03/2007    Priority: Low  . GERD 10/03/2007    Priority: Low  . DEGENERATIVE JOINT DISEASE 10/03/2007    Priority: Low  . OSTEOPOROSIS 10/03/2007    Priority: Low   Past Surgical History  Procedure Laterality Date  . Appendectomy  1951  . Tonsillectomy  1943  . Laparotomy  03/07/2012    Procedure: EXPLORATORY LAPAROTOMY;  Surgeon: Angelia Mould, MD;  Location: [facility] OR;  Service: Vascular;  Laterality: N/A;  Exploratory Laparotomy with Evacuation of Hematoma.    Family History  Problem Relation Age of Onset  . Heart disease Sister   . Breast cancer Sister     died 40, not sure when she had it  . Lymphoma Sister   . Hemochromatosis Father     tested in [place], denies history    Medications- reviewed and updated Current Outpatient Prescriptions  Medication Sig Dispense Refill  . ALPRAZolam (XANAX) 0.5 MG tablet Take 0.5 tablets (0.25 mg total) by mouth 3 (three) times daily as needed. For anxiety  90 tablet  5  . Cholecalciferol (VITAMIN D) 2000 UNITS CAPS Take 1 capsule by mouth daily.      Marland Kitchen diltiazem (CARDIZEM CD) 180 MG 24 hr capsule TAKE ONE CAPSULE BY MOUTH ONCE  DAILY  30 capsule  3  . esomeprazole (NEXIUM) 20 MG capsule Take 20 mg by mouth daily at 12 noon.      . metoprolol succinate (TOPROL-XL) 25 MG 24 hr tablet TAKE ONE TABLET BY MOUTH ONCE DAILY  30 tablet  3  . Multiple Vitamin (MULTIVITAMIN WITH MINERALS) TABS Take 1 tablet by mouth daily.      . multivitamin-lutein (OCUVITE-LUTEIN) CAPS Take 1 capsule by mouth daily.      . simvastatin (ZOCOR) 20 MG tablet TAKE ONE TABLET BY MOUTH AT BEDTIME  30 tablet  0  . warfarin (COUMADIN) 3 MG  tablet Take 1 tablet (3 mg total) by mouth as directed.  30 tablet  3   Allergies-reviewed and updated Allergies  Allergen Reactions  . Penicillins     angioedema  . Alendronate Sodium     REACTION: INTOL to Fosamax per pt.  . Atorvastatin     REACTION: INTOL to Lipitor per pt.  . Morphine And Related     Makes her mean per pt    History   Social History  . Marital Status: Single    Spouse Name: N/A    Number of Children: N/A  . Years of Education: N/A   Occupational History  . retired    Social History Main Topics  . Smoking status: Former Smoker -- 0.25 packs/day for 2 years    Types: Cigarettes    Quit date: 08/24/1951  . Smokeless tobacco: Never Used     Comment: smoked at age 34 for a short time--a few months  . Alcohol Use: No  . Drug Use: No  . Sexual Activity: None   Other Topics Concern  . None   Social History Narrative   Born and raised in [place]. Lived [place] of [place].    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    Lives with college age student that she helped raise.    Never married. No kids. 1 sister living (out of 8), a lot of contact with nieces and nephes.       Hobbies: used to sew and knit (vision issues now), used to bowl   Now enjoys computer time-keeps up with family, not a big tv time    ROS--See HPI   Objective: BP 110/62  Pulse 68  Temp(Src) 97.4 F (36.3 C)  Wt 157 lb (71.215 kg) Gen: NAD, resting comfortably in chair, moves well to table HEENT: Mucous membranes are moist. Oropharynx normal Neck: no thyromegaly CV: irregularly irregular, no murmurs Lungs: CTAB no crackles, wheeze, rhonchi Abdomen: soft/nontender/nondistended/normal bowel sounds. No rebound or guarding.  Ext: no edema Skin: warm, dry, no rash   Assessment/Plan:  HYPERLIPIDEMIA LDL slightly  above goal of 100 but patient tolerating statin and doing well. Continue simvastatin. On dilt so at max dose of 20mg .   Leg cramps None with walking. Not likely claudication. Previous evaluation by [facility]ology unrevealing.   ATRIAL FIBRILLATION, CHRONIC Continue rate control with dilt and metoprolol. Continue coumadin.   Superior mesenteric artery thrombosis Continued on coumadin for prophylaxis.   ANXIETY Very rare use of xanax about once every 2 months. Discussed I will not prescribe on daily basis but for such seldom use we can continue at this time.   ANGIODYSPLASIA, COLON No brbpr or  melena and CBC has normal hgb. Has recheck in a few months with cardiology. INR at goal    Discussed mammograms and due to family history, patient wants to continue mammograms When follow up in 6 months, patient wants to consider testing for hemochromatosis Plan to discuss given no diabetes, LFT abnormalities, skin hyperpigmentation, likely low yield Labs planned next visit: CBC (on coumadin), CMET (leg cramps), lipids (HLD), TSH ( a fib)   Meds ordered this encounter  Medications  . simvastatin (ZOCOR) 20 MG tablet    Sig: TAKE ONE TABLET BY MOUTH AT BEDTIME    Dispense:  30 tablet    Refill:  11

## 2014-03-26 NOTE — Assessment & Plan Note (Signed)
Continue rate control with dilt and metoprolol. Continue coumadin.

## 2014-03-26 NOTE — Assessment & Plan Note (Signed)
Continued on coumadin for prophylaxis.

## 2014-03-26 NOTE — Patient Instructions (Signed)
No changes today Refilled simvastatin See [place] back in 6 months Will do labwork at that time (or march would be fine)

## 2014-03-26 NOTE — Assessment & Plan Note (Signed)
Very rare use of xanax about once every 2 months. Discussed I will not prescribe on daily basis but for such seldom use we can continue at this time.

## 2014-03-26 NOTE — Assessment & Plan Note (Signed)
No brbpr or melena and CBC has normal hgb. Has recheck in a few months with cardiology. INR at goal

## 2014-03-26 NOTE — Assessment & Plan Note (Signed)
None with walking. Not likely claudication. Previous evaluation by [facility]ology unrevealing.

## 2014-03-26 NOTE — Assessment & Plan Note (Addendum)
LDL slightly above goal of 100 but patient tolerating statin and doing well. Continue simvastatin. On dilt so at max dose of 20mg .

## 2014-04-04 ENCOUNTER — Ambulatory Visit (INDEPENDENT_AMBULATORY_CARE_PROVIDER_SITE_OTHER): Payer: Medicare Other | Admitting: Family Medicine

## 2014-04-04 ENCOUNTER — Encounter: Payer: Self-pay | Admitting: Family Medicine

## 2014-04-04 VITALS — BP 140/90 | HR 80 | Temp 97.8°F | Ht 66.0 in | Wt 155.0 lb

## 2014-04-04 DIAGNOSIS — M533 Sacrococcygeal disorders, not elsewhere classified: Secondary | ICD-10-CM | POA: Diagnosis not present

## 2014-04-04 DIAGNOSIS — M545 Low back pain, unspecified: Secondary | ICD-10-CM

## 2014-04-04 NOTE — Patient Instructions (Addendum)
I think you have tweaked your sacroiliac joint.  As you are only having mild soreness, I would recommend you ice the area that is most bothersome for 2-3 days 20-30 minutes per day. Once you have done that, Would switch to heat.  You can take tylenol 1-2 x a day to help with the soreness.   You should try gas-x for your gas. I do not think this is related to your back pain.   Sacroiliac Joint Dysfunction The sacroiliac joint connects the lower part of the spine (the sacrum) with the bones of the pelvis. CAUSES  Sometimes, there is no obvious reason for sacroiliac joint dysfunction. Other times, it may occur   During pregnancy.  After injury, such as:  Car accidents.  Sport-related injuries.  Work-related injuries.  Due to one leg being shorter than the other.   SYMPTOMS  Symptoms may include:  Pain in the:  Lower back.  Buttocks.  Groin.  Thighs and legs.  Difficult sitting, standing, walking, lying, bending or lifting. TREATMENT  There are a number of types of treatment used for sacroiliac joint dysfunction, including:  Only take over-the-counter or prescription medicines for pain, discomfort, or fever as directed by your caregiver.  Medications to relax muscles.  Rest. Decreasing activity can help cut down on painful muscle spasms and allow the back to heal.  Application of heat or ice to the lower back may improve muscle spasms and soothe pain.  Brace. A special back brace, called a sacroiliac belt, can help support the joint while your back is healing.  Physical therapy can help teach comfortable positions and exercises to strengthen muscles that support the sacroiliac joint.  Cortisone injections. Injections of steroid medicine into the joint can help decrease swelling and improve pain.  Hyaluronic acid injections. This chemical improves lubrication within the sacroiliac joint, thereby decreasing pain.  Radiofrequency ablation. A special needle is placed  into the joint, where it burns away nerves that are carrying pain messages from the joint.  Surgery. Because pain occurs during movement of the joint, screws and plates may be installed in order to limit or prevent joint motion. HOME CARE INSTRUCTIONS   Take all medications exactly as directed.  Follow instructions regarding both rest and physical activity, to avoid worsening the pain.  Do physical therapy exercises exactly as prescribed. SEEK IMMEDIATE MEDICAL CARE IF:  You experience increasingly severe pain.  You develop new symptoms, such as numbness or tingling in your legs or feet.  You lose bladder or bowel control. Document Released: 11/05/2008 Document Revised: 11/01/2011 Document Reviewed: 11/05/2008 [facility] Patient Information 2015 [place], [place]. This information is not intended to replace advice given to you by your health care provider. Make sure you discuss any questions you have with your health care provider.

## 2014-04-04 NOTE — Progress Notes (Signed)
Garret Reddish, MD Phone: [phone]  Subjective:   Lisa Douglas is a 80 y.o. year old very pleasant female patient who presents with the following:  Low back Soreness Started last Thursday in low back with a soreness that then moved around to the left side and left low abdomen but remained in back. Lots of gas both belching and flatulence. Mild nausea last night but no vomiting. No nausea this morning. Thought it was getting better in low back. Thought it was her mattress so changed her bed. No injury or falls. Has not tried any therapies. Rubbing her low back helps but she thinks this causes flatulence. ROS- No dysuria, polyuria, urgency. No fevers/chills. Regular daily bowel movements.   Past Medical History- Patient Active Problem List   Diagnosis Date Noted  . ATRIAL FIBRILLATION, CHRONIC 10/03/2007    Priority: High  . CKD (chronic kidney disease), stage II 03/26/2014    Priority: Medium  . 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 ANXIETY 10/03/2007    Priority: Medium  . HYPERLIPIDEMIA 10/02/2007    Priority: Medium  . ANGIODYSPLASIA, COLON 06/01/2004    Priority: Medium  . Encounter for long-term (current) use of anticoagulants 03/20/2012    Priority: Low  . Leg cramps 12/30/2011    Priority: Low  . Vitamin D deficiency 04/01/2011    Priority: Low  . MACULAR DEGENERATION 10/03/2007    Priority: Low  . VENOUS INSUFFICIENCY 10/03/2007    Priority: Low  . GERD 10/03/2007    Priority: Low  . DEGENERATIVE JOINT DISEASE 10/03/2007    Priority: Low  . OSTEOPOROSIS 10/03/2007    Priority: Low   Medications- reviewed and updated Current Outpatient Prescriptions  Medication Sig Dispense Refill  . ALPRAZolam (XANAX) 0.5 MG tablet Take 0.5 tablets (0.25 mg total) by mouth 3 (three) times daily as needed. For anxiety  90 tablet  5  . Cholecalciferol (VITAMIN D) 2000 UNITS CAPS Take 1 capsule by mouth daily.      Marland Kitchen diltiazem (CARDIZEM CD)  180 MG 24 hr capsule TAKE ONE CAPSULE BY MOUTH ONCE DAILY  30 capsule  3  . esomeprazole (NEXIUM) 20 MG capsule Take 20 mg by mouth daily at 12 noon.      . metoprolol succinate (TOPROL-XL) 25 MG 24 hr tablet TAKE ONE TABLET BY MOUTH ONCE DAILY  30 tablet  3  . Multiple Vitamin (MULTIVITAMIN WITH MINERALS) TABS Take 1 tablet by mouth daily.      . multivitamin-lutein (OCUVITE-LUTEIN) CAPS Take 1 capsule by mouth daily.      . simvastatin (ZOCOR) 20 MG tablet TAKE ONE TABLET BY MOUTH AT BEDTIME  30 tablet  11  . warfarin (COUMADIN) 3 MG tablet Take 1 tablet (3 mg total) by mouth as directed.  30 tablet  3  . [DISCONTINUED] diltiazem (DILACOR XR) 180 MG 24 hr capsule Take 1 capsule (180 mg total) by mouth daily.  30 capsule  11   No current facility-administered medications for this visit.    Objective: BP 140/90  Pulse 80  Temp(Src) 97.8 F (36.6 C)  Ht 5\' 6"  (1.676 m)  Wt 155 lb (70.308 kg)  BMI 25.03 kg/m2 Gen: NAD, resting comfortably CV: RRR no murmurs rubs or gallops Lungs: CTAB no crackles, wheeze, rhonchi Abdomen: soft/nontender/nondistended/normal bowel sounds. No rebound or guarding. Back - Normal skin, Spine with normal alignment and no deformity.  No tenderness to vertebral process palpation.  Paraspinous muscles are mildly tender but  without spasm.   Range of motion is full at neck and lumbar sacral regions. Negative Straight leg raise. Patient does have pain with FABER on left.  Neuro- no saddle anesthesia, 5/5 strength lower extremities, 2+ reflexes Patient complains of pain when climbing onto table in left low back with mild radiation to LLQ.   Assessment/Plan:  Low back pain Occasionally into left groin. Given the low level pain highly doubt nephrolithiasis. Also pain is only with movement which makes MSK much more likely. On exam she has pain with Corky Sox and pain of her left SI joint. I suspect this is SI joint dysfunction. Advised patient to ice the area and use Tylenol  as needed. I doubt belching and flatulence related I encouraged her to use Gas-X for this. Encouraged patient to followup if no improvement with this.

## 2014-04-09 ENCOUNTER — Ambulatory Visit (INDEPENDENT_AMBULATORY_CARE_PROVIDER_SITE_OTHER): Payer: Medicare Other | Admitting: *Deleted

## 2014-04-09 DIAGNOSIS — I4891 Unspecified atrial fibrillation: Secondary | ICD-10-CM

## 2014-04-09 DIAGNOSIS — K55069 Acute infarction of intestine, part and extent unspecified: Secondary | ICD-10-CM

## 2014-04-09 DIAGNOSIS — K55059 Acute (reversible) ischemia of intestine, part and extent unspecified: Secondary | ICD-10-CM | POA: Diagnosis not present

## 2014-04-09 DIAGNOSIS — R58 Hemorrhage, not elsewhere classified: Secondary | ICD-10-CM | POA: Diagnosis not present

## 2014-04-09 DIAGNOSIS — Z7901 Long term (current) use of anticoagulants: Secondary | ICD-10-CM

## 2014-04-09 DIAGNOSIS — K661 Hemoperitoneum: Secondary | ICD-10-CM

## 2014-04-09 LAB — POCT INR: INR: 2.6

## 2014-04-09 MED ORDER — WARFARIN SODIUM 3 MG PO TABS: ORAL_TABLET | ORAL | Status: DC

## 2014-04-23 ENCOUNTER — Ambulatory Visit (INDEPENDENT_AMBULATORY_CARE_PROVIDER_SITE_OTHER): Payer: Medicare Other | Admitting: Pharmacist Clinician (PhC)/ Clinical Pharmacy Specialist

## 2014-04-23 DIAGNOSIS — K661 Hemoperitoneum: Secondary | ICD-10-CM

## 2014-04-23 DIAGNOSIS — R58 Hemorrhage, not elsewhere classified: Secondary | ICD-10-CM | POA: Diagnosis not present

## 2014-04-23 DIAGNOSIS — I4891 Unspecified atrial fibrillation: Secondary | ICD-10-CM

## 2014-04-23 DIAGNOSIS — K55069 Acute infarction of intestine, part and extent unspecified: Secondary | ICD-10-CM

## 2014-04-23 DIAGNOSIS — Z7901 Long term (current) use of anticoagulants: Secondary | ICD-10-CM

## 2014-04-23 DIAGNOSIS — K55059 Acute (reversible) ischemia of intestine, part and extent unspecified: Secondary | ICD-10-CM | POA: Diagnosis not present

## 2014-04-23 LAB — POCT INR: INR: 2

## 2014-05-13 ENCOUNTER — Other Ambulatory Visit: Payer: Self-pay | Admitting: *Deleted

## 2014-05-13 MED ORDER — DILTIAZEM HCL ER COATED BEADS 180 MG PO CP24: ORAL_CAPSULE | ORAL | Status: DC

## 2014-05-13 MED ORDER — METOPROLOL SUCCINATE ER 25 MG PO TB24: ORAL_TABLET | ORAL | Status: DC

## 2014-05-15 ENCOUNTER — Ambulatory Visit: Payer: Medicare Other | Admitting: Family Medicine

## 2014-05-21 ENCOUNTER — Ambulatory Visit (INDEPENDENT_AMBULATORY_CARE_PROVIDER_SITE_OTHER): Payer: Medicare Other | Admitting: Pharmacist Clinician (PhC)/ Clinical Pharmacy Specialist

## 2014-05-21 ENCOUNTER — Ambulatory Visit: Payer: Medicare Other | Admitting: Family Medicine

## 2014-05-21 DIAGNOSIS — Z7901 Long term (current) use of anticoagulants: Secondary | ICD-10-CM

## 2014-05-21 DIAGNOSIS — R58 Hemorrhage, not elsewhere classified: Secondary | ICD-10-CM

## 2014-05-21 DIAGNOSIS — K661 Hemoperitoneum: Secondary | ICD-10-CM

## 2014-05-21 DIAGNOSIS — I4891 Unspecified atrial fibrillation: Secondary | ICD-10-CM

## 2014-05-21 DIAGNOSIS — K55059 Acute (reversible) ischemia of intestine, part and extent unspecified: Secondary | ICD-10-CM

## 2014-05-21 DIAGNOSIS — K55069 Acute infarction of intestine, part and extent unspecified: Secondary | ICD-10-CM

## 2014-05-21 LAB — POCT INR: INR: 2.3

## 2014-05-23 ENCOUNTER — Ambulatory Visit (INDEPENDENT_AMBULATORY_CARE_PROVIDER_SITE_OTHER): Payer: Medicare Other | Admitting: Family Medicine

## 2014-05-23 DIAGNOSIS — Z23 Encounter for immunization: Secondary | ICD-10-CM | POA: Diagnosis not present

## 2014-05-28 DIAGNOSIS — L603 Nail dystrophy: Secondary | ICD-10-CM | POA: Diagnosis not present

## 2014-05-28 DIAGNOSIS — D1801 Hemangioma of skin and subcutaneous tissue: Secondary | ICD-10-CM | POA: Diagnosis not present

## 2014-05-28 DIAGNOSIS — L308 Other specified dermatitis: Secondary | ICD-10-CM | POA: Diagnosis not present

## 2014-05-28 DIAGNOSIS — L821 Other seborrheic keratosis: Secondary | ICD-10-CM | POA: Diagnosis not present

## 2014-05-28 DIAGNOSIS — L720 Epidermal cyst: Secondary | ICD-10-CM | POA: Diagnosis not present

## 2014-05-28 DIAGNOSIS — D692 Other nonthrombocytopenic purpura: Secondary | ICD-10-CM | POA: Diagnosis not present

## 2014-05-28 DIAGNOSIS — Z85820 Personal history of malignant melanoma of skin: Secondary | ICD-10-CM | POA: Diagnosis not present

## 2014-06-10 ENCOUNTER — Telehealth: Payer: Self-pay | Admitting: Gastroenterology

## 2014-06-10 NOTE — Telephone Encounter (Signed)
Patient states she wants Dr. Deatra Ina for new GI. Patient states she had constipation and used a stool softener last week. She had lots of gas. She took Gas ex and "blew it out." Over the weekend she had gas pain again. She is having bowel movements now but continues to have gas and some discomfort to lower stomach. Scheduled with Lori Hvozdovic, PA-C on 06/12/14 at 10:15 AM.

## 2014-06-12 ENCOUNTER — Telehealth: Payer: Self-pay

## 2014-06-12 ENCOUNTER — Ambulatory Visit (INDEPENDENT_AMBULATORY_CARE_PROVIDER_SITE_OTHER): Payer: Medicare Other | Admitting: Physician Assistant

## 2014-06-12 ENCOUNTER — Ambulatory Visit (INDEPENDENT_AMBULATORY_CARE_PROVIDER_SITE_OTHER): Payer: Medicare Other | Admitting: *Deleted

## 2014-06-12 ENCOUNTER — Other Ambulatory Visit (INDEPENDENT_AMBULATORY_CARE_PROVIDER_SITE_OTHER): Payer: Medicare Other

## 2014-06-12 ENCOUNTER — Other Ambulatory Visit: Payer: Medicare Other

## 2014-06-12 ENCOUNTER — Telehealth: Payer: Self-pay | Admitting: Cardiology

## 2014-06-12 ENCOUNTER — Encounter: Payer: Self-pay | Admitting: Physician Assistant

## 2014-06-12 VITALS — BP 120/70 | HR 88 | Ht 65.0 in | Wt 155.0 lb

## 2014-06-12 DIAGNOSIS — I4891 Unspecified atrial fibrillation: Secondary | ICD-10-CM

## 2014-06-12 DIAGNOSIS — K55069 Acute infarction of intestine, part and extent unspecified: Secondary | ICD-10-CM

## 2014-06-12 DIAGNOSIS — R1032 Left lower quadrant pain: Secondary | ICD-10-CM

## 2014-06-12 DIAGNOSIS — K661 Hemoperitoneum: Secondary | ICD-10-CM | POA: Diagnosis not present

## 2014-06-12 DIAGNOSIS — Z7901 Long term (current) use of anticoagulants: Secondary | ICD-10-CM

## 2014-06-12 DIAGNOSIS — K5732 Diverticulitis of large intestine without perforation or abscess without bleeding: Secondary | ICD-10-CM

## 2014-06-12 DIAGNOSIS — K5731 Diverticulosis of large intestine without perforation or abscess with bleeding: Secondary | ICD-10-CM

## 2014-06-12 DIAGNOSIS — K550 Acute vascular disorders of intestine: Secondary | ICD-10-CM

## 2014-06-12 HISTORY — DX: Diverticulosis of large intestine without perforation or abscess with bleeding: K57.31

## 2014-06-12 LAB — CBC WITH DIFFERENTIAL/PLATELET
BASOS PCT: 0.4 % (ref 0.0–3.0)
Basophils Absolute: 0 10*3/uL (ref 0.0–0.1)
Eosinophils Absolute: 0.1 10*3/uL (ref 0.0–0.7)
Eosinophils Relative: 0.5 % (ref 0.0–5.0)
HCT: 46.5 % — ABNORMAL HIGH (ref 36.0–46.0)
HEMOGLOBIN: 15.1 g/dL — AB (ref 12.0–15.0)
LYMPHS ABS: 1.7 10*3/uL (ref 0.7–4.0)
Lymphocytes Relative: 13.3 % (ref 12.0–46.0)
MCHC: 32.6 g/dL (ref 30.0–36.0)
MCV: 93.6 fl (ref 78.0–100.0)
MONOS PCT: 6.5 % (ref 3.0–12.0)
Monocytes Absolute: 0.8 10*3/uL (ref 0.1–1.0)
Neutro Abs: 10.1 10*3/uL — ABNORMAL HIGH (ref 1.4–7.7)
Neutrophils Relative %: 79.3 % — ABNORMAL HIGH (ref 43.0–77.0)
PLATELETS: 284 10*3/uL (ref 150.0–400.0)
RBC: 4.97 Mil/uL (ref 3.87–5.11)
RDW: 14.5 % (ref 11.5–15.5)
WBC: 12.7 10*3/uL — AB (ref 4.0–10.5)

## 2014-06-12 LAB — COMPREHENSIVE METABOLIC PANEL
ALT: 15 U/L (ref 0–35)
AST: 20 U/L (ref 0–37)
Albumin: 3.4 g/dL — ABNORMAL LOW (ref 3.5–5.2)
Alkaline Phosphatase: 75 U/L (ref 39–117)
BILIRUBIN TOTAL: 0.7 mg/dL (ref 0.2–1.2)
BUN: 13 mg/dL (ref 6–23)
CO2: 24 meq/L (ref 19–32)
Calcium: 9.4 mg/dL (ref 8.4–10.5)
Chloride: 103 mEq/L (ref 96–112)
Creatinine, Ser: 0.8 mg/dL (ref 0.4–1.2)
GFR: 71.55 mL/min (ref >60.00)
Glucose, Bld: 99 mg/dL (ref 70–99)
Potassium: 4.5 mEq/L (ref 3.5–5.1)
SODIUM: 141 meq/L (ref 135–145)
TOTAL PROTEIN: 7.9 g/dL (ref 6.0–8.3)

## 2014-06-12 LAB — POCT INR: INR: 2.2

## 2014-06-12 MED ORDER — METRONIDAZOLE 250 MG PO TABS: 250.0000 mg | ORAL_TABLET | Freq: Three times a day (TID) | ORAL | Status: DC

## 2014-06-12 MED ORDER — CIPROFLOXACIN HCL 500 MG PO TABS: 500.0000 mg | ORAL_TABLET | Freq: Two times a day (BID) | ORAL | Status: DC

## 2014-06-12 NOTE — Progress Notes (Signed)
Patient ID: Lisa Douglas, female   DOB: 19-Jun-1928, 80 y.o.   MRN: [id number]    HPI: Lisa Douglas is an 80 year old white female referred for evaluation by Dr. Rushie Chestnut to 2 left lower quadrant and suprapubic abdominal pain Lisa Douglas has been previously followed by Dr. Sharlett Iles in the past for a family history of colorectal neoplasia. Her last colonoscopy was in 2005 at which time she was noted to have moderate diverticulosis. She was on the colonoscopy recall list for 2010 but states she preferred not to have her colonoscopy at that time and she has not had one since. She did develop sudden onset of acute abdominal pain in July of 2013. She went to the emergency room and had a CT that showed  an SMA embolus for which she had  a is a n SMA embolectomy. She has been doing fairly well since that time. She states that approximately 2 weeks ago she had gone several days without a bowel movement and then strained to have a firm bowel movement. Since then she has had left lower quadrant and suprapubic pain that is fairly constant. She describes it as a dull ache but it is more pronounced when she walks or right in the car. She's had no fever or chills. She has no nausea or vomiting. She has no urinary frequency. She has not noted any bright red blood per rectum.  Past Medical History  Diagnosis Date  . Macular degeneration (senile) of retina, unspecified   . Atrial fibrillation   . Unspecified venous (peripheral) insufficiency   . Other and unspecified hyperlipidemia   . Esophageal reflux   . Diverticulosis of colon (without mention of hemorrhage)   . Angiodysplasia of intestine (without mention of hemorrhage)   . Osteoarthrosis, unspecified whether generalized or localized, unspecified site   . Osteoporosis, unspecified   . Anxiety state, unspecified   . Mesenteric embolus   . 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 Not current problem.        Past Surgical History  Procedure Laterality Date  . Appendectomy  1951  . Tonsillectomy  1943  . Laparotomy  03/07/2012    Procedure: EXPLORATORY LAPAROTOMY;  Surgeon: Angelia Mould, MD;  Location: [facility] OR;  Service: Vascular;  Laterality: N/A;  Exploratory Laparotomy with Evacuation of Hematoma.   Family History  Problem Relation Age of Onset  . Heart disease Sister   . Breast cancer Sister     died 53, not sure when she had it  . Lymphoma Sister   . Hemochromatosis Father     tested in [place], denies history   History  Substance Use Topics  . Smoking status: Former Smoker -- 0.25 packs/day for 2 years    Types: Cigarettes    Quit date: 08/24/1951  . Smokeless tobacco: Never Used     Comment: smoked at age 58 for a short time--a few months  . Alcohol Use: No   Current Outpatient Prescriptions  Medication Sig Dispense Refill  . ALPRAZolam (XANAX) 0.5 MG tablet Take 0.5 tablets (0.25 mg total) by mouth 3 (three) times daily as needed. For anxiety  90 tablet  5  . Cholecalciferol (VITAMIN D) 2000 UNITS CAPS Take 1 capsule by mouth daily.      Marland Kitchen diltiazem (CARDIZEM CD) 180 MG 24 hr capsule TAKE ONE CAPSULE BY MOUTH ONCE DAILY  30 capsule  3  . esomeprazole (NEXIUM) 20 MG capsule  Take 20 mg by mouth daily at 12 noon.      . metoprolol succinate (TOPROL-XL) 25 MG 24 hr tablet TAKE ONE TABLET BY MOUTH ONCE DAILY  30 tablet  3  . Multiple Vitamin (MULTIVITAMIN WITH MINERALS) TABS Take 1 tablet by mouth daily.      . multivitamin-lutein (OCUVITE-LUTEIN) CAPS Take 1 capsule by mouth daily.      . simvastatin (ZOCOR) 20 MG tablet TAKE ONE TABLET BY MOUTH AT BEDTIME  30 tablet  11  . warfarin (COUMADIN) 3 MG tablet Take as directed by coumadin clinic  30 tablet  3  . ciprofloxacin (CIPRO) 500 MG tablet Take 1 tablet (500 mg total) by mouth 2 (two) times daily.  20 tablet  0  . metroNIDAZOLE (FLAGYL) 250 MG tablet Take 1 tablet (250 mg total) by mouth 3 (three) times daily.  30  tablet  0  . [DISCONTINUED] diltiazem (DILACOR XR) 180 MG 24 hr capsule Take 1 capsule (180 mg total) by mouth daily.  30 capsule  11   No current facility-administered medications for this visit.   Allergies  Allergen Reactions  . Penicillins     angioedema  . Alendronate Sodium     REACTION: INTOL to Fosamax per pt.  . Atorvastatin     REACTION: INTOL to Lipitor per pt.  . Morphine And Related     Makes her mean per pt     Review of Systems: Gen: Denies any fever, chills, sweats, anorexia, fatigue, weakness, malaise, weight loss, and sleep disorder CV: Denies chest pain, angina, palpitations, syncope, orthopnea, PND, peripheral edema, and claudication. Resp: Denies dyspnea at rest, dyspnea with exercise, cough, sputum, wheezing, coughing up blood, and pleurisy. GI: Denies vomiting blood, jaundice, and fecal incontinence.   Denies dysphagia or odynophagia. GU : Denies urinary burning, blood in urine, urinary frequency, urinary hesitancy, nocturnal urination, and urinary incontinence. MS: Denies joint pain, limitation of movement, and swelling, stiffness, low back pain, extremity pain. Denies muscle weakness, cramps, atrophy.  Derm: Denies rash, itching, dry skin, hives, moles, warts, or unhealing ulcers.  Psych: Denies depression, anxiety, memory loss, suicidal ideation, hallucinations, paranoia, and confusion. Heme: Denies bruising, bleeding, and enlarged lymph nodes. Neuro:  Denies any headaches, dizziness, paresthesias. Endo:  Denies any problems with DM, thyroid, adrenal function   Prior Endoscopies: Colonoscopy on 06/01/2004 revealed diverticulosis and AVMs.  Physical Exam: BP 120/70  Pulse 88  Ht 5\' 5"  (1.651 m)  Wt 155 lb (70.308 kg)  BMI 25.79 kg/m2 Constitutional: Pleasant,well-developed,elderly female in no acute distress. HEENT: Normocephalic and atraumatic. Conjunctivae are normal. No scleral icterus. Neck supple. No adenopathy Cardiovascular: Normal rate,  regular rhythm.  Pulmonary/chest: Effort normal and breath sounds normal. No wheezing, rales or rhonchi. Abdominal: Soft, nondistended, mild to moderate LLQ and suprapubic tenderness, no rebound or guarding,  Bowel sounds active throughout. There are no masses palpable. No hepatomegaly. Extremities: no edema Lymphadenopathy: No cervical adenopathy noted. Neurological: Alert and oriented to person place and time. Skin: Skin is warm and dry. No rashes noted. Psychiatric: Normal mood and affect. Behavior is normal.  ASSESSMENT AND PLAN: 80 year old white female with a known history of diverticular disease here with a 2 week history of left lower quadrant and suprapubic pain. She may have active diverticulitis at this time. A CBC,BMET, and urinalysis will be obtained. She will be empirically treated for diverticulitis with a course of Cipro 500 mg by mouth twice daily for 10 days, along with Flagyl 250 mg 3  times daily for 10 days. She will return for reevaluation in 3-4 days. If she fails to no improvement by that time, she will be scheduled for a CT of the abdomen and pelvis to be sure she doesn't have a small abscess. In the meantime, she's been advised to adhere to a low residue diet. She's been instructed to use milk of magnesia or MiraLAX on a daily basis to avoid constipation. She has been advised to call us immediately if her discomfort becomes more severe. We will contact cardiology and let them know which antibiotics patient was started on so that her Coumadin daily adjusted accordingly.  Lisa Douglas, Vita Barley PA-C 06/12/2014, 11:40 AM

## 2014-06-12 NOTE — Telephone Encounter (Signed)
New message     Pt was seen today.  Pt was prescribed cipro and flagyl.  Pt is on coumadin and they want coumadin clinic to know.

## 2014-06-12 NOTE — Patient Instructions (Addendum)
Your physician has requested that you go to the basement for the following lab work before leaving today:  CBC, CMET, UA   We have sent the following medications to your pharmacy for you to pick up at your convenience:  Cipro and Flagyl  As discussed with Cecille Rubin, you may use Milk of Magnesia or Miralax to avoid constipation.  You have been given a copy of a low residue diet.  Please follow up with Cecille Rubin on Monday 06/17/2014 at 1:15pm

## 2014-06-12 NOTE — Telephone Encounter (Signed)
[facility] aware, pt seen today after GI appt

## 2014-06-12 NOTE — Telephone Encounter (Signed)
Spoke with Coumadin clinic and let them know that patient had seen a PA in our office today and was put on Cipro and Flagyl, in case her Coumadin needed to be adjusted accordingly.

## 2014-06-12 NOTE — Progress Notes (Signed)
Reviewed and agree with management plan.  Chyrel Taha T. Lamari Beckles, MD FACG 

## 2014-06-13 NOTE — Progress Notes (Signed)
Reviewed and agree with management plan.  Leeya Rusconi T. Miquel Lamson, MD FACG 

## 2014-06-17 ENCOUNTER — Ambulatory Visit (INDEPENDENT_AMBULATORY_CARE_PROVIDER_SITE_OTHER): Payer: Medicare Other | Admitting: Pharmacist

## 2014-06-17 ENCOUNTER — Encounter: Payer: Self-pay | Admitting: Physician Assistant

## 2014-06-17 ENCOUNTER — Ambulatory Visit (INDEPENDENT_AMBULATORY_CARE_PROVIDER_SITE_OTHER): Payer: Medicare Other | Admitting: Physician Assistant

## 2014-06-17 ENCOUNTER — Other Ambulatory Visit (INDEPENDENT_AMBULATORY_CARE_PROVIDER_SITE_OTHER): Payer: Medicare Other

## 2014-06-17 VITALS — BP 122/80 | HR 66 | Ht 65.0 in | Wt 154.4 lb

## 2014-06-17 DIAGNOSIS — K5732 Diverticulitis of large intestine without perforation or abscess without bleeding: Secondary | ICD-10-CM | POA: Diagnosis not present

## 2014-06-17 DIAGNOSIS — I4891 Unspecified atrial fibrillation: Secondary | ICD-10-CM

## 2014-06-17 DIAGNOSIS — Z7901 Long term (current) use of anticoagulants: Secondary | ICD-10-CM

## 2014-06-17 DIAGNOSIS — K550 Acute vascular disorders of intestine: Secondary | ICD-10-CM

## 2014-06-17 DIAGNOSIS — K55069 Acute infarction of intestine, part and extent unspecified: Secondary | ICD-10-CM

## 2014-06-17 DIAGNOSIS — D649 Anemia, unspecified: Secondary | ICD-10-CM | POA: Diagnosis not present

## 2014-06-17 DIAGNOSIS — K661 Hemoperitoneum: Secondary | ICD-10-CM

## 2014-06-17 LAB — CBC WITH DIFFERENTIAL/PLATELET
BASOS ABS: 0 10*3/uL (ref 0.0–0.1)
Basophils Relative: 0.3 % (ref 0.0–3.0)
EOS ABS: 0.1 10*3/uL (ref 0.0–0.7)
Eosinophils Relative: 0.7 % (ref 0.0–5.0)
HCT: 44.6 % (ref 36.0–46.0)
Hemoglobin: 14.6 g/dL (ref 12.0–15.0)
Lymphocytes Relative: 15.8 % (ref 12.0–46.0)
Lymphs Abs: 1.5 10*3/uL (ref 0.7–4.0)
MCHC: 32.8 g/dL (ref 30.0–36.0)
MCV: 93.1 fl (ref 78.0–100.0)
Monocytes Absolute: 0.9 10*3/uL (ref 0.1–1.0)
Monocytes Relative: 9.2 % (ref 3.0–12.0)
Neutro Abs: 6.9 10*3/uL (ref 1.4–7.7)
Neutrophils Relative %: 74 % (ref 43.0–77.0)
Platelets: 380 10*3/uL (ref 150.0–400.0)
RBC: 4.79 Mil/uL (ref 3.87–5.11)
RDW: 13.6 % (ref 11.5–15.5)
WBC: 9.3 10*3/uL (ref 4.0–10.5)

## 2014-06-17 LAB — POCT INR: INR: 2.3

## 2014-06-17 MED ORDER — SACCHAROMYCES BOULARDII 250 MG PO CAPS: ORAL_CAPSULE | ORAL | Status: DC

## 2014-06-17 MED ORDER — METRONIDAZOLE 250 MG PO TABS: 250.0000 mg | ORAL_TABLET | Freq: Two times a day (BID) | ORAL | Status: DC

## 2014-06-17 NOTE — Progress Notes (Signed)
Reviewed and agree with management plan.  Ezariah Nace T. Cobi Aldape, MD FACG 

## 2014-06-17 NOTE — Patient Instructions (Signed)
Continue Cipro twice daily, Decrease Flagyl to twice daily  We have sent the following medications to your pharmacy for you to pick up at your convenience: Florastor 250 mg take 1 tablet by mouth twice daily for 4 weeks  You have been scheduled for a follow-up appoinment with Laveda Abbe on November 3,2015 at 1:15 pm.

## 2014-06-17 NOTE — Progress Notes (Signed)
Patient ID: Lisa Douglas, female   DOB: [birth year], 80 y.o.   MRN: [id number]     History of Present Illness: This is a follow-up for this pleasant 80 year old female who was seen last week with left lower quadrant drain likely diverticulitis. She was started on Cipro and Flagyl and returns for follow-up. She has no further pain she has no fever or chills. She does have a metallic taste in her mouth and occasionally has a nauseous feeling for a few minutes after taking her antibiotics. Overall however, she states she is feeling better. She is now having one or 2 mushy bowel movements per day. No bright red blood per rectum. No melena.   Past Medical History  Diagnosis Date  . Macular degeneration (senile) of retina, unspecified   . Atrial fibrillation   . Unspecified venous (peripheral) insufficiency   . Other and unspecified hyperlipidemia   . Esophageal reflux   . Diverticulosis of colon (without mention of hemorrhage)   . Angiodysplasia of intestine (without mention of hemorrhage)   . Osteoarthrosis, unspecified whether generalized or localized, unspecified site   . Osteoporosis, unspecified   . Anxiety state, unspecified   . Mesenteric embolus   . 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 Not current problem.      Past Surgical History  Procedure Laterality Date  . Appendectomy  1951  . Tonsillectomy  1943  . Laparotomy  03/07/2012    Procedure: EXPLORATORY LAPAROTOMY;  Surgeon: Angelia Mould, MD;  Location: [facility] OR;  Service: Vascular;  Laterality: N/A;  Exploratory Laparotomy with Evacuation of Hematoma.   Family History  Problem Relation Age of Onset  . Heart disease Sister   . Breast cancer Sister     died 13, not sure when she had it  . Lymphoma Sister   . Hemochromatosis Father     tested in [place], denies history   History  Substance Use Topics  . Smoking status: Former Smoker -- 0.25 packs/day for  2 years    Types: Cigarettes    Quit date: 08/24/1951  . Smokeless tobacco: Never Used     Comment: smoked at age 43 for a short time--a few months  . Alcohol Use: No   Current Outpatient Prescriptions  Medication Sig Dispense Refill  . ALPRAZolam (XANAX) 0.5 MG tablet Take 0.5 tablets (0.25 mg total) by mouth 3 (three) times daily as needed. For anxiety  90 tablet  5  . Cholecalciferol (VITAMIN D) 2000 UNITS CAPS Take 1 capsule by mouth daily.      . ciprofloxacin (CIPRO) 500 MG tablet Take 1 tablet (500 mg total) by mouth 2 (two) times daily.  20 tablet  0  . diltiazem (CARDIZEM CD) 180 MG 24 hr capsule TAKE ONE CAPSULE BY MOUTH ONCE DAILY  30 capsule  3  . esomeprazole (NEXIUM) 20 MG capsule Take 20 mg by mouth daily at 12 noon.      . metoprolol succinate (TOPROL-XL) 25 MG 24 hr tablet TAKE ONE TABLET BY MOUTH ONCE DAILY  30 tablet  3  . metroNIDAZOLE (FLAGYL) 250 MG tablet Take 1 tablet (250 mg total) by mouth 3 (three) times daily.  30 tablet  0  . Multiple Vitamin (MULTIVITAMIN WITH MINERALS) TABS Take 1 tablet by mouth daily.      . multivitamin-lutein (OCUVITE-LUTEIN) CAPS Take 1 capsule by mouth daily.      . simvastatin (ZOCOR) 20 MG tablet  TAKE ONE TABLET BY MOUTH AT BEDTIME  30 tablet  11  . warfarin (COUMADIN) 3 MG tablet Take as directed by coumadin clinic  30 tablet  3  . [DISCONTINUED] diltiazem (DILACOR XR) 180 MG 24 hr capsule Take 1 capsule (180 mg total) by mouth daily.  30 capsule  11   No current facility-administered medications for this visit.   Allergies  Allergen Reactions  . Penicillins     angioedema  . Alendronate Sodium     REACTION: INTOL to Fosamax per pt.  . Atorvastatin     REACTION: INTOL to Lipitor per pt.  . Morphine And Related     Makes her mean per pt      Review of Systems: Gen: Denies any fever, chills, sweats, anorexia, fatigue, weakness, malaise, weight loss, and sleep disorder CV: Denies chest pain, angina, palpitations, syncope,  orthopnea, PND, peripheral edema, and claudication. Resp: Denies dyspnea at rest, dyspnea with exercise, cough, sputum, wheezing, coughing up blood, and pleurisy. GI: Denies vomiting blood, jaundice, and fecal incontinence.   Denies dysphagia or odynophagia. GU : Denies urinary burning, blood in urine, urinary frequency, urinary hesitancy, nocturnal urination, and urinary incontinence. MS: Denies joint pain, limitation of movement, and swelling, stiffness, low back pain, extremity pain. Denies muscle weakness, cramps, atrophy.  Derm: Denies rash, itching, dry skin, hives, moles, warts, or unhealing ulcers.  Psych: Denies depression, anxiety, memory loss, suicidal ideation, hallucinations, paranoia, and confusion. Heme: Denies bruising, bleeding, and enlarged lymph nodes. Neuro:  Denies any headaches, dizziness, paresthesia Endo:  Denies any problems with DM, thyroid, adrenal  LAB RESULTS: Blood work on 06/12/2014 showed a total bili of 0.7, alkaline phosphatase 75, AST 22, ALT 15. White blood cell count 12.7, hemoglobin 15.1, hematocrit 46.5. Platelets 284,000.  Recent Labs  06/17/14 1214  INR 2.3     Physical Exam: General: Pleasant, well developed , well nourished female in no acute distress Head: Normocephalic and atraumatic Eyes:  sclerae anicteric, conjunctiva pink  Ears: Normal auditory acuity Lungs: Clear throughout to auscultation Heart: Regular rate and rhythm Abdomen: Soft, non distended, non-tender. No masses, no hepatomegaly. Normal bowel sounds Rectal: deferred Musculoskeletal: Symmetrical with no gross deformities  Extremities: No edema  Neurological: Alert oriented x 4, grossly nonfocal Psychological:  Alert and cooperative. Normal mood and affect  Assessment and Recommendations: 80 year old female seen last week with left lower quadrant pain, empirically treated for diverticulitis here for follow-up. She has had significant improvement. She does have some mild  nausea. She's been advised to continue Cipro twice a day and she can cut her Flagyl back to twice a day as well she will continue a low residue diet and we will let Florastor 250 mg twice daily for 4 weeks she will follow up in 7-10 days sooner as needed.        Artavia Jeanlouis, Deloris Ping 06/17/2014, Pager [phone]

## 2014-06-25 ENCOUNTER — Ambulatory Visit: Payer: Medicare Other | Admitting: Physician Assistant

## 2014-06-26 ENCOUNTER — Ambulatory Visit: Payer: Medicare Other | Admitting: Physician Assistant

## 2014-06-26 ENCOUNTER — Ambulatory Visit (INDEPENDENT_AMBULATORY_CARE_PROVIDER_SITE_OTHER): Payer: Medicare Other | Admitting: Physician Assistant

## 2014-06-26 ENCOUNTER — Encounter: Payer: Self-pay | Admitting: Physician Assistant

## 2014-06-26 VITALS — BP 120/68 | HR 60 | Ht 65.0 in | Wt 151.2 lb

## 2014-06-26 DIAGNOSIS — K5730 Diverticulosis of large intestine without perforation or abscess without bleeding: Secondary | ICD-10-CM

## 2014-06-26 NOTE — Patient Instructions (Signed)
Please follow high fiber diet given today   Please purchase Benefiber over the counter, take one tablespoon mix it in juice or water, drink once daily   Call back if symptoms become worse or you are not better

## 2014-06-26 NOTE — Progress Notes (Signed)
Patient ID: Lisa Douglas, female   DOB: Jun 23, 1928, 80 y.o.   MRN: [id number]     History of Present Illness: Lisa Douglas is a delightful 80 year old female who was seen on October 26 with left lower quadrant pain. She was empirically treated for diverticulitis with a course of Cipro and Flagyl, and returns today for reevaluation. She feels well. She has no abdominal pain. Her bowel movements are normal and regular. She would like them to be a little firmer. She has no bright red blood per rectum and no melena. She has no fever or chills. She has no nausea or vomiting. She was involved in a [employer] accident yesterday but she says she sustained no injuries and feels fine.   Past Medical History  Diagnosis Date  . Macular degeneration (senile) of retina, unspecified   . Atrial fibrillation   . Unspecified venous (peripheral) insufficiency   . Other and unspecified hyperlipidemia   . Esophageal reflux   . Diverticulosis of colon (without mention of hemorrhage)   . Angiodysplasia of intestine (without mention of hemorrhage)   . Osteoarthrosis, unspecified whether generalized or localized, unspecified site   . Osteoporosis, unspecified   . Anxiety state, unspecified   . Mesenteric embolus   . 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 Not current problem.      Past Surgical History  Procedure Laterality Date  . Appendectomy  1951  . Tonsillectomy  1943  . Laparotomy  03/07/2012    Procedure: EXPLORATORY LAPAROTOMY;  Surgeon: Angelia Mould, MD;  Location: [facility] OR;  Service: Vascular;  Laterality: N/A;  Exploratory Laparotomy with Evacuation of Hematoma.   Family History  Problem Relation Age of Onset  . Heart disease Sister   . Breast cancer Sister     died 97, not sure when she had it  . Lymphoma Sister   . Hemochromatosis Father     tested in [place], denies history   History  Substance Use Topics  . Smoking status:  Former Smoker -- 0.25 packs/day for 2 years    Types: Cigarettes    Quit date: 08/24/1951  . Smokeless tobacco: Never Used     Comment: smoked at age 49 for a short time--a few months  . Alcohol Use: No   Current Outpatient Prescriptions  Medication Sig Dispense Refill  . ALPRAZolam (XANAX) 0.5 MG tablet Take 0.5 tablets (0.25 mg total) by mouth 3 (three) times daily as needed. For anxiety 90 tablet 5  . Cholecalciferol (VITAMIN D) 2000 UNITS CAPS Take 1 capsule by mouth daily.    Marland Kitchen diltiazem (CARDIZEM CD) 180 MG 24 hr capsule TAKE ONE CAPSULE BY MOUTH ONCE DAILY 30 capsule 3  . esomeprazole (NEXIUM) 20 MG capsule Take 20 mg by mouth daily at 12 noon.    . metoprolol succinate (TOPROL-XL) 25 MG 24 hr tablet TAKE ONE TABLET BY MOUTH ONCE DAILY 30 tablet 3  . Multiple Vitamin (MULTIVITAMIN WITH MINERALS) TABS Take 1 tablet by mouth daily.    . multivitamin-lutein (OCUVITE-LUTEIN) CAPS Take 1 capsule by mouth daily.    Marland Kitchen saccharomyces boulardii (FLORASTOR) 250 MG capsule Take 1 capsule by mouth twice a day for 4 weeks 60 capsule 0  . simvastatin (ZOCOR) 20 MG tablet TAKE ONE TABLET BY MOUTH AT BEDTIME 30 tablet 11  . warfarin (COUMADIN) 3 MG tablet Take as directed by coumadin clinic 30 tablet 3  . [DISCONTINUED] diltiazem (DILACOR XR) 180  MG 24 hr capsule Take 1 capsule (180 mg total) by mouth daily. 30 capsule 11   No current facility-administered medications for this visit.   Allergies  Allergen Reactions  . Penicillins     angioedema  . Alendronate Sodium     REACTION: INTOL to Fosamax per pt.  . Atorvastatin     REACTION: INTOL to Lipitor per pt.  . Morphine And Related     Makes her mean per pt      Review of Systems: Gen: Denies any fever, chills, sweats, anorexia, fatigue, weakness, malaise, weight loss, and sleep disorder CV: Denies chest pain, angina, palpitations, syncope, orthopnea, PND, peripheral edema, and claudication. Resp: Denies dyspnea at rest, dyspnea with  exercise, cough, sputum, wheezing, coughing up blood, and pleurisy. GI: Denies vomiting blood, jaundice, and fecal incontinence.   Denies dysphagia or odynophagia. GU : Denies urinary burning, blood in urine, urinary frequency, urinary hesitancy, nocturnal urination, and urinary incontinence. MS: Denies joint pain, limitation of movement, and swelling, stiffness, low back pain, extremity pain. Denies muscle weakness, cramps, atrophy.  Derm: Denies rash, itching, dry skin, hives, moles, warts, or unhealing ulcers.  Psych: Denies depression, anxiety, memory loss, suicidal ideation, hallucinations, paranoia, and confusion. Heme: Denies bruising, bleeding, and enlarged lymph nodes. Neuro:  Denies any headaches, dizziness, paresthesia Endo:  Denies any problems with DM, thyroid, adrenal  LAB RESULTS: CBC on October 26 had a white blood count of 9.3, hemoglobin 14.6, hematocrit 44.6, platelets 380,000.   Physical Exam: General: Pleasant, well developed ,  Elderly female in no acute distress Head: Normocephalic and atraumatic Eyes:  sclerae anicteric, conjunctiva pink  Ears: Normal auditory acuity Lungs: Clear throughout to auscultation Heart: Regular rate and rhythm Abdomen: Soft, non distended, non-tender. No masses, no hepatomegaly. Normal bowel so Musculoskeletal: Symmetrical with no gross deformities  Extremities: No edema  Neurological: Alert oriented x 4, grossly nonfocal Psychological:  Alert and cooperative. Normal mood and affect  Assessment and Recommendations: 80 year old white female status post an episode of left lower quadrant pain which was likely due to some early diverticulitis here for follow-up her pain has totally resolved. She would like to advance her diet and was told she may resume a high-fiber low-fat diet. She will use Benefiber as needed. She will follow up as needed.   Breigh Annett, Vita Barley PA-C 06/26/2014,

## 2014-06-26 NOTE — Progress Notes (Signed)
Reviewed and agree with management plan.  Saarah Dewing T. Monserrath Junio, MD FACG 

## 2014-07-16 ENCOUNTER — Ambulatory Visit (INDEPENDENT_AMBULATORY_CARE_PROVIDER_SITE_OTHER): Payer: Medicare Other | Admitting: *Deleted

## 2014-07-16 DIAGNOSIS — I4891 Unspecified atrial fibrillation: Secondary | ICD-10-CM | POA: Diagnosis not present

## 2014-07-16 DIAGNOSIS — K661 Hemoperitoneum: Secondary | ICD-10-CM

## 2014-07-16 DIAGNOSIS — K683 Retroperitoneal hematoma: Secondary | ICD-10-CM

## 2014-07-16 DIAGNOSIS — Z7901 Long term (current) use of anticoagulants: Secondary | ICD-10-CM

## 2014-07-16 DIAGNOSIS — K550 Acute vascular disorders of intestine: Secondary | ICD-10-CM

## 2014-07-16 DIAGNOSIS — K55069 Acute infarction of intestine, part and extent unspecified: Secondary | ICD-10-CM

## 2014-07-16 LAB — POCT INR: INR: 3.4

## 2014-07-17 ENCOUNTER — Telehealth: Payer: Self-pay | Admitting: *Deleted

## 2014-07-17 NOTE — Telephone Encounter (Signed)
Lisa Douglas,   Request for refill on Florastor came from [place] visit patient had with you she was told to take Florastor for 4 weeks Do you want to continue Florastor?   Thank you

## 2014-07-17 NOTE — Telephone Encounter (Signed)
Called patient lmom for patient to call office back

## 2014-07-17 NOTE — Telephone Encounter (Signed)
If she is not having diarrhea, she can stop florastor.

## 2014-07-22 ENCOUNTER — Telehealth: Payer: Self-pay | Admitting: *Deleted

## 2014-07-22 NOTE — Telephone Encounter (Signed)
PATIENT CALLED BACK  PATIENT STATED THAT SHE IS NO LONGER HAVING DIARRHEA, PATIENT STOPPED FLORASTOR ON HER OWN.

## 2014-07-25 ENCOUNTER — Ambulatory Visit (INDEPENDENT_AMBULATORY_CARE_PROVIDER_SITE_OTHER): Payer: Medicare Other | Admitting: Family Medicine

## 2014-07-25 ENCOUNTER — Ambulatory Visit (INDEPENDENT_AMBULATORY_CARE_PROVIDER_SITE_OTHER): Payer: Medicare Other

## 2014-07-25 VITALS — BP 118/70 | HR 70 | Temp 98.1°F | Resp 16 | Ht 64.0 in | Wt 148.0 lb

## 2014-07-25 DIAGNOSIS — Z7901 Long term (current) use of anticoagulants: Secondary | ICD-10-CM

## 2014-07-25 DIAGNOSIS — S51012A Laceration without foreign body of left elbow, initial encounter: Secondary | ICD-10-CM | POA: Diagnosis not present

## 2014-07-25 DIAGNOSIS — S99922A Unspecified injury of left foot, initial encounter: Secondary | ICD-10-CM

## 2014-07-25 DIAGNOSIS — S60410A Abrasion of right index finger, initial encounter: Secondary | ICD-10-CM | POA: Diagnosis not present

## 2014-07-25 DIAGNOSIS — S92352A Displaced fracture of fifth metatarsal bone, left foot, initial encounter for closed fracture: Secondary | ICD-10-CM | POA: Diagnosis not present

## 2014-07-25 DIAGNOSIS — S92102A Unspecified fracture of left talus, initial encounter for closed fracture: Secondary | ICD-10-CM

## 2014-07-25 MED ORDER — HYDROCODONE-ACETAMINOPHEN 5-325 MG PO TABS: 1.0000 | ORAL_TABLET | Freq: Four times a day (QID) | ORAL | Status: DC | PRN

## 2014-07-25 NOTE — Patient Instructions (Signed)
Metatarsal Fracture, Undisplaced  A metatarsal fracture is a break in the bone(s) of the foot. These are the bones of the foot that connect your toes to the bones of the ankle.  DIAGNOSIS   The diagnoses of these fractures are usually made with X-rays. If there are problems in the forefoot and x-rays are normal a later bone scan will usually make the diagnosis.   TREATMENT AND HOME CARE INSTRUCTIONS  · Treatment may or may not include a cast or walking shoe. When casts are needed the use is usually for short periods of time so as not to slow down healing with muscle wasting (atrophy).  · Activities should be stopped until further advised by your caregiver.  · Wear shoes with adequate shock absorbing capabilities and stiff soles.  · Alternative exercise may be undertaken while waiting for healing. These may include bicycling and swimming, or as your caregiver suggests.  · It is important to keep all follow-up visits or specialty referrals. The failure to keep these appointments could result in improper bone healing and chronic pain or disability.  · Warning: Do not drive a car or operate a motor vehicle until your caregiver specifically tells you it is safe to do so.  IF YOU DO NOT HAVE A CAST OR SPLINT:  · You may walk on your injured foot as tolerated or advised.  · Do not put any weight on your injured foot for as long as directed by your caregiver. Slowly increase the amount of time you walk on the foot as the pain allows or as advised.  · Use crutches until you can bear weight without pain. A gradual increase in weight bearing may help.  · Apply ice to the injury for 15-20 minutes each hour while awake for the first 2 days. Put the ice in a plastic bag and place a towel between the bag of ice and your skin.  · Only take over-the-counter or prescription medicines for pain, discomfort, or fever as directed by your caregiver.  SEEK IMMEDIATE MEDICAL CARE IF:   · Your cast gets damaged or breaks.  · You have  continued severe pain or more swelling than you did before the cast was put on, or the pain is not controlled with medications.  · Your skin or nails below the injury turn blue or grey, or feel cold or numb.  · There is a bad smell, or new stains or pus-like (purulent) drainage coming from the cast.  MAKE SURE YOU:   · Understand these instructions.  · Will watch your condition.  · Will get help right away if you are not doing well or get worse.  Document Released: 05/01/2002 Document Revised: 11/01/2011 Document Reviewed: 03/22/2008  ExitCare® Patient Information ©2015 [employer]. This information is not intended to replace advice given to you by your health care provider. Make sure you discuss any questions you have with your health care provider.

## 2014-07-25 NOTE — Progress Notes (Signed)
Subjective:    Patient ID: Lisa Douglas, female    DOB: Jan 23, 1928, 80 y.o.   MRN: [id number] This chart was scribed for Delman Cheadle, MD by Marti Sleigh, Medical Scribe. This patient was seen in Room 8 and the patient's care was started a 2:10 PM.  Chief Complaint  Patient presents with  . Foot Pain    Left x last night mistep down steps and fell    HPI Past Medical History  Diagnosis Date  . Macular degeneration (senile) of retina, unspecified   . Atrial fibrillation   . Unspecified venous (peripheral) insufficiency   . Other and unspecified hyperlipidemia   . Esophageal reflux   . Diverticulosis of colon (without mention of hemorrhage)   . Angiodysplasia of intestine (without mention of hemorrhage)   . Osteoarthrosis, unspecified whether generalized or localized, unspecified site   . Osteoporosis, unspecified   . Anxiety state, unspecified   . Mesenteric embolus   . 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 Not current problem.     Allergies  Allergen Reactions  . Penicillins     angioedema  . Alendronate Sodium     REACTION: INTOL to Fosamax per pt.  . Atorvastatin     REACTION: INTOL to Lipitor per pt.  . Morphine And Related     Makes her mean per pt   Current Outpatient Prescriptions on File Prior to Visit  Medication Sig Dispense Refill  . ALPRAZolam (XANAX) 0.5 MG tablet Take 0.5 tablets (0.25 mg total) by mouth 3 (three) times daily as needed. For anxiety 90 tablet 5  . Cholecalciferol (VITAMIN D) 2000 UNITS CAPS Take 1 capsule by mouth daily.    Marland Kitchen diltiazem (CARDIZEM CD) 180 MG 24 hr capsule TAKE ONE CAPSULE BY MOUTH ONCE DAILY 30 capsule 3  . esomeprazole (NEXIUM) 20 MG capsule Take 20 mg by mouth daily at 12 noon.    . metoprolol succinate (TOPROL-XL) 25 MG 24 hr tablet TAKE ONE TABLET BY MOUTH ONCE DAILY 30 tablet 3  . Multiple Vitamin (MULTIVITAMIN WITH MINERALS) TABS Take 1 tablet by mouth daily.     . multivitamin-lutein (OCUVITE-LUTEIN) CAPS Take 1 capsule by mouth daily.    . simvastatin (ZOCOR) 20 MG tablet TAKE ONE TABLET BY MOUTH AT BEDTIME 30 tablet 11  . warfarin (COUMADIN) 3 MG tablet Take as directed by coumadin clinic 30 tablet 3  . [DISCONTINUED] diltiazem (DILACOR XR) 180 MG 24 hr capsule Take 1 capsule (180 mg total) by mouth daily. 30 capsule 11   No current facility-administered medications on file prior to visit.     HPI Comments: Lisa Douglas is a 80 y.o. female with a hx of degenerative joint disease who presents to [facility] complaining of foot pain after falling last night. Pt states she was descending stairs and missed the final step. Pt denies syncope, dizziness, head injury or lightheadedness.  Pt also has a puncture wound to her elbow and finger, which occurred when she fell.   Review of Systems  Constitutional: Negative for fever and chills.  Gastrointestinal: Negative for abdominal pain.  Musculoskeletal: Positive for myalgias and arthralgias.  Skin: Positive for color change and wound.       Objective:  BP 118/70 mmHg  Pulse 70  Temp(Src) 98.1 F (36.7 C) (Oral)  Resp 16  Ht 5\' 4"  (1.626 m)  Wt 148 lb (67.132 kg)  BMI 25.39 kg/m2  SpO2 99%  Physical Exam  Constitutional: She is oriented to person, place, and time. She appears well-developed and well-nourished.  HENT:  Head: Normocephalic and atraumatic.  Eyes: Pupils are equal, round, and reactive to light.  Neck: Neck supple.  Cardiovascular: Normal rate and regular rhythm.   Pulmonary/Chest: Effort normal and breath sounds normal. No respiratory distress.  Musculoskeletal:  No point tenderness over CF ligament, or medial and lateral malleolus. Moderate ecchymosis and swelling over whole mid and proximal foot and distal ankle.   Neurological: She is alert and oriented to person, place, and time.  Skin: Skin is warm and dry.  Psychiatric: She has a normal mood and affect. Her behavior is  normal.  Nursing note and vitals reviewed.  [facility] reading (PRIMARY) by Dr. Brigitte Pulse, pt had question of possible nondisplaced fracture of the proximal fifth medi tarsal and lateral distal calcaneous seen on AP view with possible bone chip, versus bone spur, on oblique near distal calcaneous. Copious osteopenia and osteoarthritis noted.  EXAM: LEFT FOOT - COMPLETE 3+ VIEW  COMPARISON: None.  FINDINGS: Displaced fracture of the base of the left fifth metatarsal is present. Avulsion fracture noted from the posterior aspect of the distal portion of the talus. Degenerative changes are present of foot ankle.  IMPRESSION: 1. Displaced fracture of the base of left fifth metatarsal. 2. Avulsion fracture arising from the posterior aspect of the distal talus. Age undetermined.   Electronically Signed  By: Lisa Douglas Register  On: 07/25/2014 14:59    Assessment & Plan:   Foot injury, left, initial encounter - Plan: DG Foot Complete Left  Blood thinned due to long-term anticoagulant use  Abrasion of second finger of right hand, initial encounter  Laceration of left elbow, initial encounter  Fracture of fifth metatarsal bone of left foot, closed, initial encounter - Plan: Ambulatory referral to Orthopedic Surgery  Talus fracture, left, closed, initial encounter - Plan: Ambulatory referral to Orthopedic Surgery  Meds ordered this encounter  Medications  . HYDROcodone-acetaminophen (NORCO/VICODIN) 5-325 MG per tablet    Sig: Take 1 tablet by mouth every 6 (six) hours as needed for moderate pain.    Dispense:  20 tablet    Refill:  0    I personally performed the services described in this documentation, which was scribed in my presence. The recorded information has been reviewed and considered, and addended by me as needed.  Delman Cheadle, MD MPH

## 2014-07-29 DIAGNOSIS — M84375A Stress fracture, left foot, initial encounter for fracture: Secondary | ICD-10-CM | POA: Diagnosis not present

## 2014-07-30 ENCOUNTER — Ambulatory Visit (INDEPENDENT_AMBULATORY_CARE_PROVIDER_SITE_OTHER): Payer: Medicare Other | Admitting: *Deleted

## 2014-07-30 DIAGNOSIS — K55069 Acute infarction of intestine, part and extent unspecified: Secondary | ICD-10-CM

## 2014-07-30 DIAGNOSIS — I4891 Unspecified atrial fibrillation: Secondary | ICD-10-CM | POA: Diagnosis not present

## 2014-07-30 DIAGNOSIS — K661 Hemoperitoneum: Secondary | ICD-10-CM | POA: Diagnosis not present

## 2014-07-30 DIAGNOSIS — Z7901 Long term (current) use of anticoagulants: Secondary | ICD-10-CM | POA: Diagnosis not present

## 2014-07-30 DIAGNOSIS — K550 Acute vascular disorders of intestine: Secondary | ICD-10-CM

## 2014-07-30 LAB — POCT INR: INR: 2.4

## 2014-08-01 ENCOUNTER — Other Ambulatory Visit: Payer: Self-pay | Admitting: *Deleted

## 2014-08-01 MED ORDER — WARFARIN SODIUM 3 MG PO TABS: ORAL_TABLET | ORAL | Status: DC

## 2014-08-19 DIAGNOSIS — M84375D Stress fracture, left foot, subsequent encounter for fracture with routine healing: Secondary | ICD-10-CM | POA: Diagnosis not present

## 2014-08-20 ENCOUNTER — Ambulatory Visit (INDEPENDENT_AMBULATORY_CARE_PROVIDER_SITE_OTHER): Payer: Medicare Other | Admitting: Pharmacist

## 2014-08-20 DIAGNOSIS — Z7901 Long term (current) use of anticoagulants: Secondary | ICD-10-CM | POA: Diagnosis not present

## 2014-08-20 DIAGNOSIS — I4891 Unspecified atrial fibrillation: Secondary | ICD-10-CM

## 2014-08-20 DIAGNOSIS — K661 Hemoperitoneum: Secondary | ICD-10-CM

## 2014-08-20 DIAGNOSIS — K550 Acute vascular disorders of intestine: Secondary | ICD-10-CM

## 2014-08-20 DIAGNOSIS — K55069 Acute infarction of intestine, part and extent unspecified: Secondary | ICD-10-CM

## 2014-08-20 LAB — POCT INR: INR: 2.5

## 2014-09-11 DIAGNOSIS — M84375D Stress fracture, left foot, subsequent encounter for fracture with routine healing: Secondary | ICD-10-CM | POA: Diagnosis not present

## 2014-09-17 ENCOUNTER — Ambulatory Visit (INDEPENDENT_AMBULATORY_CARE_PROVIDER_SITE_OTHER): Payer: Medicare Other | Admitting: *Deleted

## 2014-09-17 DIAGNOSIS — I4891 Unspecified atrial fibrillation: Secondary | ICD-10-CM

## 2014-09-17 DIAGNOSIS — K550 Acute vascular disorders of intestine: Secondary | ICD-10-CM | POA: Diagnosis not present

## 2014-09-17 DIAGNOSIS — Z7901 Long term (current) use of anticoagulants: Secondary | ICD-10-CM | POA: Diagnosis not present

## 2014-09-17 DIAGNOSIS — K661 Hemoperitoneum: Secondary | ICD-10-CM

## 2014-09-17 DIAGNOSIS — K55069 Acute infarction of intestine, part and extent unspecified: Secondary | ICD-10-CM

## 2014-09-17 LAB — POCT INR: INR: 2

## 2014-09-18 ENCOUNTER — Other Ambulatory Visit: Payer: Self-pay | Admitting: Cardiology

## 2014-10-16 ENCOUNTER — Ambulatory Visit (INDEPENDENT_AMBULATORY_CARE_PROVIDER_SITE_OTHER): Payer: Medicare Other | Admitting: *Deleted

## 2014-10-16 DIAGNOSIS — Z7901 Long term (current) use of anticoagulants: Secondary | ICD-10-CM | POA: Diagnosis not present

## 2014-10-16 DIAGNOSIS — K550 Acute vascular disorders of intestine: Secondary | ICD-10-CM | POA: Diagnosis not present

## 2014-10-16 DIAGNOSIS — I4891 Unspecified atrial fibrillation: Secondary | ICD-10-CM | POA: Diagnosis not present

## 2014-10-16 DIAGNOSIS — K55069 Acute infarction of intestine, part and extent unspecified: Secondary | ICD-10-CM

## 2014-10-16 DIAGNOSIS — K661 Hemoperitoneum: Secondary | ICD-10-CM | POA: Diagnosis not present

## 2014-10-16 LAB — POCT INR: INR: 1.8

## 2014-10-24 ENCOUNTER — Other Ambulatory Visit: Payer: Self-pay | Admitting: Surgery

## 2014-10-29 ENCOUNTER — Ambulatory Visit (INDEPENDENT_AMBULATORY_CARE_PROVIDER_SITE_OTHER): Payer: Medicare Other | Admitting: Family Medicine

## 2014-10-29 ENCOUNTER — Ambulatory Visit: Payer: Medicare Other | Admitting: Family Medicine

## 2014-10-29 ENCOUNTER — Encounter: Payer: Self-pay | Admitting: Family Medicine

## 2014-10-29 VITALS — BP 128/70 | HR 86 | Temp 97.5°F | Resp 18 | Ht 65.5 in | Wt 154.5 lb

## 2014-10-29 DIAGNOSIS — E785 Hyperlipidemia, unspecified: Secondary | ICD-10-CM

## 2014-10-29 DIAGNOSIS — Z23 Encounter for immunization: Secondary | ICD-10-CM

## 2014-10-29 DIAGNOSIS — I482 Chronic atrial fibrillation, unspecified: Secondary | ICD-10-CM

## 2014-10-29 DIAGNOSIS — K5520 Angiodysplasia of colon without hemorrhage: Secondary | ICD-10-CM | POA: Diagnosis not present

## 2014-10-29 NOTE — Assessment & Plan Note (Signed)
Patient declines repeat colonoscopy. No bleeding per history. We will check cbc in 10 days along with lipids when she calls to have them placed at [facility].

## 2014-10-29 NOTE — Patient Instructions (Addendum)
Prevnar today  Sign up for establish visit. Ok to be 6 months from now on Tues-Thursday establish slots.   Call Keba back in 10 days to enter labs at [facility] for the following:  Hyperlipidemia: CBC, CMET, lipid, tsh

## 2014-10-29 NOTE — Assessment & Plan Note (Signed)
Continues in a fib today. Rate is controlled with diltiazem and metoprolol. Coumadin monitored by cardiology clinic.

## 2014-10-29 NOTE — Assessment & Plan Note (Signed)
Too early for repeat full lipid panel-patient to call in 10 days and we will enter. Continue simvastatin 20mg . We discussed indication for primary prevention at her age not clear.

## 2014-10-29 NOTE — Progress Notes (Signed)
Pre visit review using our clinic review tool, if applicable. No additional management support is needed unless otherwise documented below in the visit note. 

## 2014-10-29 NOTE — Progress Notes (Signed)
Garret Reddish, MD Phone: [phone]  Subjective:   Lisa Douglas is a 81 y.o. year old very pleasant female patient who presents with the following:  Atrial fibrillation-rate controlled and anticoagulated Doing well on metoprolol and diltiazem. Coumadin clinic at cardiology with goal 2-2.5 INR balancing history GI bleed. Hgb in October in normal range.  ROS- occasional cramps in calves bilaterally , sometimes R worse than left (can occur at rest-not necessarily exertional)-feels like sore feeling, a good deal of flatulence without recent change. Not taking benefiber as advised by GI. Denies palpitations or chest pain.   Hyperlipidemia-poor control off statin past check, has restarted simvastatin 20mg .   Lab Results  Component Value Date   LDLCALC 116* 11/06/2013  ROS- no chest pain or shortness of breath. No myalgias  Angiodysplasia of colon- stable Patient declines repeat colonoscopy.  ROS- no brbpr, no melena  Past Medical History- Patient Active Problem List   Diagnosis Date Noted  . ATRIAL FIBRILLATION, CHRONIC 10/03/2007    Priority: High  . 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 ANXIETY 10/03/2007    Priority: Medium  . Hyperlipidemia 10/02/2007    Priority: Medium  . Angiodysplasia of colon 06/01/2004    Priority: Medium  . Diverticulitis of colon 06/12/2014    Priority: Low  . CKD (chronic kidney disease), stage II 03/26/2014    Priority: Low  . Encounter for long-term (current) use of anticoagulants 03/20/2012    Priority: Low  . Leg cramps 12/30/2011    Priority: Low  . Vitamin D deficiency 04/01/2011    Priority: Low  . MACULAR DEGENERATION 10/03/2007    Priority: Low  . VENOUS INSUFFICIENCY 10/03/2007    Priority: Low  . GERD 10/03/2007    Priority: Low  . DEGENERATIVE JOINT DISEASE 10/03/2007    Priority: Low  . OSTEOPOROSIS 10/03/2007    Priority: Low   Medications- reviewed and updated Current Outpatient  Prescriptions  Medication Sig Dispense Refill  . ALPRAZolam (XANAX) 0.5 MG tablet Take 0.5 tablets (0.25 mg total) by mouth 3 (three) times daily as needed. For anxiety 90 tablet 5  . CARTIA XT 180 MG 24 hr capsule TAKE ONE CAPSULE BY MOUTH ONCE DAILY 30 capsule 6  . Cholecalciferol (VITAMIN D) 2000 UNITS CAPS Take 1 capsule by mouth daily.    Marland Kitchen esomeprazole (NEXIUM) 20 MG capsule Take 20 mg by mouth daily at 12 noon.    . metoprolol succinate (TOPROL-XL) 25 MG 24 hr tablet TAKE ONE TABLET BY MOUTH ONCE DAILY 30 tablet 6  . Multiple Vitamin (MULTIVITAMIN WITH MINERALS) TABS Take 1 tablet by mouth daily.    . multivitamin-lutein (OCUVITE-LUTEIN) CAPS Take 1 capsule by mouth daily.    . simvastatin (ZOCOR) 20 MG tablet TAKE ONE TABLET BY MOUTH AT BEDTIME 30 tablet 11  . warfarin (COUMADIN) 3 MG tablet Take as directed by coumadin clinic 30 tablet 3   Objective: BP 128/70 mmHg  Pulse 86  Temp(Src) 97.5 F (36.4 C) (Oral)  Resp 18  Ht 5' 5.5" (1.664 m)  Wt 154 lb 8 oz (70.081 kg)  BMI 25.31 kg/m2  SpO2 96% Gen: NAD, resting comfortably, appears stated age CV: irregularly irregular, no murmurs rubs or gallops Lungs: CTAB no crackles, wheeze, rhonchi Abdomen: soft/nontender/nondistended/normal bowel sounds.  Ext: no edema, venous stasis changes Skin: warm, dry, no rash Neuro: grossly normal, moves all extremities   Assessment/Plan:  ATRIAL FIBRILLATION, CHRONIC Continues in a fib today. Rate is controlled with  diltiazem and metoprolol. Coumadin monitored by cardiology clinic.     Hyperlipidemia Too early for repeat full lipid panel-patient to call in 10 days and we will enter. Continue simvastatin 20mg . We discussed indication for primary prevention at her age not clear.    Angiodysplasia of colon Patient declines repeat colonoscopy. No bleeding per history. We will check cbc in 10 days along with lipids when she calls to have them placed at [facility].    6 month follow up  for annual wellness visit. See AVS for future fasting labs that will need to be entered for [facility].   Orders Placed This Encounter  Procedures  . Pneumococcal conjugate vaccine 13-valent IM

## 2014-10-30 ENCOUNTER — Telehealth: Payer: Self-pay | Admitting: Family Medicine

## 2014-10-30 NOTE — Telephone Encounter (Signed)
Pt.notified

## 2014-10-30 NOTE — Telephone Encounter (Signed)
Pt has questions about her labs would like a cb

## 2014-10-30 NOTE — Telephone Encounter (Signed)
No rush on labs. She can come back in September. Tell her to come fasting if AM appointment

## 2014-10-30 NOTE — Telephone Encounter (Signed)
Pt states she was told by you to call me back in 10 days to have labs entered which I see on the AVS. Pt wants to know if she can just wait to have these labs done in Sept or was this something that she needs to have done in 10 days?

## 2014-11-05 ENCOUNTER — Ambulatory Visit (INDEPENDENT_AMBULATORY_CARE_PROVIDER_SITE_OTHER): Payer: Medicare Other | Admitting: *Deleted

## 2014-11-05 DIAGNOSIS — I4891 Unspecified atrial fibrillation: Secondary | ICD-10-CM | POA: Diagnosis not present

## 2014-11-05 DIAGNOSIS — Z7901 Long term (current) use of anticoagulants: Secondary | ICD-10-CM | POA: Diagnosis not present

## 2014-11-05 DIAGNOSIS — K55069 Acute infarction of intestine, part and extent unspecified: Secondary | ICD-10-CM

## 2014-11-05 DIAGNOSIS — K550 Acute vascular disorders of intestine: Secondary | ICD-10-CM

## 2014-11-05 DIAGNOSIS — K661 Hemoperitoneum: Secondary | ICD-10-CM | POA: Diagnosis not present

## 2014-11-05 DIAGNOSIS — K683 Retroperitoneal hematoma: Secondary | ICD-10-CM

## 2014-11-05 LAB — POCT INR: INR: 2.3

## 2014-11-08 DIAGNOSIS — H35342 Macular cyst, hole, or pseudohole, left eye: Secondary | ICD-10-CM | POA: Diagnosis not present

## 2014-11-08 DIAGNOSIS — H2513 Age-related nuclear cataract, bilateral: Secondary | ICD-10-CM | POA: Diagnosis not present

## 2014-11-08 DIAGNOSIS — IMO0002 Reserved for concepts with insufficient information to code with codable children: Secondary | ICD-10-CM | POA: Insufficient documentation

## 2014-11-08 DIAGNOSIS — H3531 Nonexudative age-related macular degeneration: Secondary | ICD-10-CM | POA: Diagnosis not present

## 2014-12-03 ENCOUNTER — Ambulatory Visit (INDEPENDENT_AMBULATORY_CARE_PROVIDER_SITE_OTHER): Payer: Medicare Other | Admitting: Pharmacist

## 2014-12-03 DIAGNOSIS — K550 Acute vascular disorders of intestine: Secondary | ICD-10-CM | POA: Diagnosis not present

## 2014-12-03 DIAGNOSIS — I4891 Unspecified atrial fibrillation: Secondary | ICD-10-CM | POA: Diagnosis not present

## 2014-12-03 DIAGNOSIS — K55069 Acute infarction of intestine, part and extent unspecified: Secondary | ICD-10-CM

## 2014-12-03 DIAGNOSIS — Z7901 Long term (current) use of anticoagulants: Secondary | ICD-10-CM

## 2014-12-03 DIAGNOSIS — K661 Hemoperitoneum: Secondary | ICD-10-CM

## 2014-12-03 LAB — POCT INR: INR: 2.3

## 2014-12-03 MED ORDER — WARFARIN SODIUM 3 MG PO TABS: ORAL_TABLET | ORAL | Status: DC

## 2014-12-23 ENCOUNTER — Ambulatory Visit (INDEPENDENT_AMBULATORY_CARE_PROVIDER_SITE_OTHER): Payer: Medicare Other | Admitting: Family Medicine

## 2014-12-23 ENCOUNTER — Encounter: Payer: Self-pay | Admitting: Family Medicine

## 2014-12-23 VITALS — BP 122/70 | HR 84 | Temp 97.8°F | Wt 152.0 lb

## 2014-12-23 DIAGNOSIS — K439 Ventral hernia without obstruction or gangrene: Secondary | ICD-10-CM | POA: Diagnosis not present

## 2014-12-23 DIAGNOSIS — R143 Flatulence: Secondary | ICD-10-CM | POA: Diagnosis not present

## 2014-12-23 DIAGNOSIS — R140 Abdominal distension (gaseous): Secondary | ICD-10-CM | POA: Diagnosis not present

## 2014-12-23 NOTE — Assessment & Plan Note (Signed)
Easily reducible. I doubt this is causing her flatulence.

## 2014-12-23 NOTE — Progress Notes (Signed)
Garret Reddish, MD  Subjective:  Lisa Douglas is a 81 y.o. year old very pleasant female patient who presents with:  Bloating and flatulence Started with gas in March- a lot of flatulence. Feelings of being bloated in abdomen. If she gets bloated, she gets some nausea. Drinks water and gets better. Burping also helps. Relieving flatulence also relieves bloating sensation. She has been eating less in hopes that she would have less flatulence. No other treatments tried. Has not tried gas x because states she is not sure when flatulence may occur. Takign PPI each AM. No new medications. She has a known epigastric hernia in site where she had surgery for SMA thrombosis and she wonders if this is contributing. States it is reducible.   ROS- no diarrhea, no fecal incontinence, no unintentional weight loss, no abdominal pain whatsoever.   Past Medical History- atrial fibrillation on coumadin, dilt, metoprolol, HLD, anxiety, angiodysplasia of colon with suggested 5 year follow up colonoscopy 10 years ago which patient declines  Medications- reviewed and updated Current Outpatient Prescriptions  Medication Sig Dispense Refill  . CARTIA XT 180 MG 24 hr capsule TAKE ONE CAPSULE BY MOUTH ONCE DAILY 30 capsule 6  . Cholecalciferol (VITAMIN D) 2000 UNITS CAPS Take 1 capsule by mouth daily.    Marland Kitchen esomeprazole (NEXIUM) 20 MG capsule Take 20 mg by mouth daily at 12 noon.    . metoprolol succinate (TOPROL-XL) 25 MG 24 hr tablet TAKE ONE TABLET BY MOUTH ONCE DAILY 30 tablet 6  . Multiple Vitamin (MULTIVITAMIN WITH MINERALS) TABS Take 1 tablet by mouth daily.    . multivitamin-lutein (OCUVITE-LUTEIN) CAPS Take 1 capsule by mouth daily.    . simvastatin (ZOCOR) 20 MG tablet TAKE ONE TABLET BY MOUTH AT BEDTIME 30 tablet 11  . warfarin (COUMADIN) 3 MG tablet Take as directed by coumadin clinic 30 tablet 3  . ALPRAZolam (XANAX) 0.5 MG tablet Take 0.5 tablets (0.25 mg total) by mouth 3 (three) times daily as needed.  For anxiety (Patient not taking: Reported on 12/23/2014) 90 tablet 5   Objective: BP 122/70 mmHg  Pulse 84  Temp(Src) 97.8 F (36.6 C)  Wt 152 lb (68.947 kg) Gen: NAD, resting comfortably in chair, with 1 hand assist able to get on table CV: RRR no murmurs rubs or gallops Lungs: CTAB no crackles, wheeze, rhonchi Abdomen: soft/nontender/nondistended/normal bowel sounds. No rebound or guarding. Epigastric hernia noted about 5 x 5 cm with bowel contents easily reducible Ext: no edema Skin: warm, dry, no rash over abdomen   Assessment/Plan:  Bloating and flatulence Bloating is relieved by water, burping or flatulence. Discussed flatulence and bloating likely benign in nature and suggested gas x prn especially before she is going to be in a public place. This is a change for her and could consider colonoscopy but she has declined that in the past and again today. I think this would likely be low yield honestly.   Epigastric hernia Easily reducible. I doubt this is causing her flatulence.    Return precautions advised.

## 2014-12-23 NOTE — Patient Instructions (Signed)
Your hernia is stable and I am able to push everything back in easily.   I am sorry you are having flatulence. I would consider using gas-x before you are going to be in public situations like church.   See me if symptoms worsen or have new symptoms or if you have any concerns or questions

## 2014-12-31 ENCOUNTER — Ambulatory Visit (INDEPENDENT_AMBULATORY_CARE_PROVIDER_SITE_OTHER): Payer: Medicare Other

## 2014-12-31 DIAGNOSIS — K661 Hemoperitoneum: Secondary | ICD-10-CM

## 2014-12-31 DIAGNOSIS — Z7901 Long term (current) use of anticoagulants: Secondary | ICD-10-CM

## 2014-12-31 DIAGNOSIS — K55069 Acute infarction of intestine, part and extent unspecified: Secondary | ICD-10-CM

## 2014-12-31 DIAGNOSIS — I4891 Unspecified atrial fibrillation: Secondary | ICD-10-CM | POA: Diagnosis not present

## 2014-12-31 DIAGNOSIS — K550 Acute vascular disorders of intestine: Secondary | ICD-10-CM

## 2014-12-31 LAB — POCT INR: INR: 2.2

## 2015-01-03 DIAGNOSIS — L239 Allergic contact dermatitis, unspecified cause: Secondary | ICD-10-CM | POA: Diagnosis not present

## 2015-01-03 DIAGNOSIS — Z85820 Personal history of malignant melanoma of skin: Secondary | ICD-10-CM | POA: Diagnosis not present

## 2015-01-13 ENCOUNTER — Telehealth: Payer: Self-pay | Admitting: Family Medicine

## 2015-01-13 NOTE — Telephone Encounter (Signed)
Pt was 12-23-14. Pt is still having epigastric issues. Can I use sda slot?

## 2015-01-13 NOTE — Telephone Encounter (Signed)
Yes ma'm :)

## 2015-01-14 NOTE — Telephone Encounter (Signed)
Pt has been sch

## 2015-01-21 ENCOUNTER — Encounter: Payer: Self-pay | Admitting: Family Medicine

## 2015-01-21 ENCOUNTER — Telehealth: Payer: Self-pay | Admitting: Family Medicine

## 2015-01-21 ENCOUNTER — Ambulatory Visit (INDEPENDENT_AMBULATORY_CARE_PROVIDER_SITE_OTHER): Payer: Medicare Other | Admitting: Family Medicine

## 2015-01-21 VITALS — BP 140/82 | HR 83 | Temp 97.4°F | Wt 153.0 lb

## 2015-01-21 DIAGNOSIS — R198 Other specified symptoms and signs involving the digestive system and abdomen: Secondary | ICD-10-CM | POA: Diagnosis not present

## 2015-01-21 DIAGNOSIS — R142 Eructation: Secondary | ICD-10-CM

## 2015-01-21 DIAGNOSIS — R140 Abdominal distension (gaseous): Secondary | ICD-10-CM | POA: Diagnosis not present

## 2015-01-21 DIAGNOSIS — N182 Chronic kidney disease, stage 2 (mild): Secondary | ICD-10-CM

## 2015-01-21 NOTE — Telephone Encounter (Signed)
Please call pt and have her scheduled for lab visit before 01/24/15, orders entered.

## 2015-01-21 NOTE — Telephone Encounter (Signed)
What do I order the bmet under Dr. Yong Channel?

## 2015-01-21 NOTE — Telephone Encounter (Signed)
Thanks

## 2015-01-21 NOTE — Patient Instructions (Signed)
We have ordered a CT scan per your request. We have agreed to tolerate symptoms if scan is normal.   For constipation- senokot-s would be ok as needed. Could also try a 1/2 capful of miralax

## 2015-01-21 NOTE — Telephone Encounter (Signed)
Pt need an order  For  BMET  Prior to Ct  per stacy at [facility] pt scheduled for 01-24-2015@2 :30pm  [place]  3rd floor lb CT Address: [address], [place], [place] [address]  [phone]

## 2015-01-21 NOTE — Progress Notes (Signed)
Garret Reddish, MD  Subjective:  Lisa Douglas is a 81 y.o. year old very pleasant female patient who presents with:  Constipation alternating with diarrhea, bloating, burping Continues to have some burping and flatulence and bloating. Not as bothersome as previous except for continued burping at times. Has now started to have more difficulty with constipation but then has had diarrhea at times. Took milk of magnesia and had watery stool. Went to [employer] a few weeks later, had an episode of diarrhea later that evening. Brings family member with her today. She is very concerned about her symptoms especially with SMA thrombosis history and requests CT abdomen/pelvis.  Has not tried gas x as flatulence did get better.  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 Controlled pt/inr in therapeutic range.   Medications- reviewed and updated Current Outpatient Prescriptions  Medication Sig Dispense Refill  . CARTIA XT 180 MG 24 hr capsule TAKE ONE CAPSULE BY MOUTH ONCE DAILY 30 capsule 6  . Cholecalciferol (VITAMIN D) 2000 UNITS CAPS Take 1 capsule by mouth daily.    Marland Kitchen esomeprazole (NEXIUM) 20 MG capsule Take 20 mg by mouth daily at 12 noon.    . metoprolol succinate (TOPROL-XL) 25 MG 24 hr tablet TAKE ONE TABLET BY MOUTH ONCE DAILY 30 tablet 6  . Multiple Vitamin (MULTIVITAMIN WITH MINERALS) TABS Take 1 tablet by mouth daily.    . simvastatin (ZOCOR) 20 MG tablet TAKE ONE TABLET BY MOUTH AT BEDTIME 30 tablet 11  . warfarin (COUMADIN) 3 MG tablet Take as directed by coumadin clinic 30 tablet 3  . ALPRAZolam (XANAX) 0.5 MG tablet Take 0.5 tablets (0.25 mg total) by mouth 3 (three) times daily as needed. For anxiety (Patient not taking: Reported on 12/23/2014) 90 tablet 5  . multivitamin-lutein (OCUVITE-LUTEIN) CAPS Take 1 capsule by mouth daily.    . [DISCONTINUED]  diltiazem (DILACOR XR) 180 MG 24 hr capsule Take 1 capsule (180 mg total) by mouth daily. 30 capsule 11   No current facility-administered medications for this visit.    Objective: BP 140/82 mmHg  Pulse 83  Temp(Src) 97.4 F (36.3 C)  Wt 153 lb (69.4 kg) Gen: NAD, resting comfortably CV: irregularly irregular no murmurs rubs or gallops Lungs: CTAB no crackles, wheeze, rhonchi Abdomen: soft/nontender/nondistended/normal bowel sounds. No rebound or guarding. Epigastric hernia remains about 5x5cm noted and easily reducible.  Ext: no edema Skin: warm, dry, still without rash over abdomen  Neuro: grossly normal, moves all extremities   Assessment/Plan:  Constipation alternating with diarrhea, bloating, burping Benign abdominal exam. Discussed I though CT abdomen/pelvis would be low yield. Given high level of concern from patient and family member and the fact they discussed icnreasing peace of mind if had this scan, we opted to proceed with an agreement to tolerate symptoms as long as no significant findings as to cause (which I think there is a low likelihood of finding)   Doubt something like ovarian cancer thought his could have atypical symptoms such as above. Could be constipation related and we will plan on senokot-s vs. Miralax. Patient denies interested in repeat colonoscopy and at age 77, likely not high yield either.   Return precautions advised.   Orders Placed This Encounter  Procedures  . CT Abdomen Pelvis W Contrast    Standing Status: Future     Number of Occurrences:  Standing Expiration Date: 04/22/2016    Order Specific Question:  Reason for Exam (SYMPTOM  OR DIAGNOSIS REQUIRED)    Answer:  alternating constipation and diarrhea, bloating, burping in patient with history SMA thrombosis on coumadin (therapeutic)    Order Specific Question:  Preferred imaging location?    Answer:  [facility]

## 2015-01-21 NOTE — Telephone Encounter (Signed)
ckd stage II, tahnks

## 2015-01-22 ENCOUNTER — Other Ambulatory Visit (INDEPENDENT_AMBULATORY_CARE_PROVIDER_SITE_OTHER): Payer: Medicare Other

## 2015-01-22 DIAGNOSIS — N182 Chronic kidney disease, stage 2 (mild): Secondary | ICD-10-CM

## 2015-01-22 LAB — BASIC METABOLIC PANEL
BUN: 15 mg/dL (ref 6–23)
CO2: 33 mEq/L — ABNORMAL HIGH (ref 19–32)
CREATININE: 0.81 mg/dL (ref 0.40–1.20)
Calcium: 9.1 mg/dL (ref 8.4–10.5)
Chloride: 102 mEq/L (ref 96–112)
GFR: 71.45 mL/min (ref >60.00)
GLUCOSE: 76 mg/dL (ref 70–99)
Potassium: 4.1 mEq/L (ref 3.5–5.1)
SODIUM: 139 meq/L (ref 135–145)

## 2015-01-22 NOTE — Addendum Note (Signed)
Addended by: Clyde Lundborg A on: 01/22/2015 08:13 AM   Modules accepted: Orders

## 2015-01-24 ENCOUNTER — Inpatient Hospital Stay: Admission: RE | Admit: 2015-01-24 | Payer: Medicare Other | Source: Ambulatory Visit

## 2015-01-28 ENCOUNTER — Ambulatory Visit (INDEPENDENT_AMBULATORY_CARE_PROVIDER_SITE_OTHER)
Admission: RE | Admit: 2015-01-28 | Discharge: 2015-01-28 | Disposition: A | Discharge status: Home / Self Care | Payer: Medicare Other | Source: Ambulatory Visit | Attending: Family Medicine | Admitting: Family Medicine

## 2015-01-28 ENCOUNTER — Ambulatory Visit (INDEPENDENT_AMBULATORY_CARE_PROVIDER_SITE_OTHER): Payer: Medicare Other | Admitting: *Deleted

## 2015-01-28 DIAGNOSIS — R142 Eructation: Secondary | ICD-10-CM

## 2015-01-28 DIAGNOSIS — R198 Other specified symptoms and signs involving the digestive system and abdomen: Secondary | ICD-10-CM | POA: Diagnosis not present

## 2015-01-28 DIAGNOSIS — K661 Hemoperitoneum: Secondary | ICD-10-CM | POA: Diagnosis not present

## 2015-01-28 DIAGNOSIS — Z7901 Long term (current) use of anticoagulants: Secondary | ICD-10-CM | POA: Diagnosis not present

## 2015-01-28 DIAGNOSIS — K5730 Diverticulosis of large intestine without perforation or abscess without bleeding: Secondary | ICD-10-CM | POA: Diagnosis not present

## 2015-01-28 DIAGNOSIS — R140 Abdominal distension (gaseous): Secondary | ICD-10-CM

## 2015-01-28 DIAGNOSIS — K439 Ventral hernia without obstruction or gangrene: Secondary | ICD-10-CM | POA: Diagnosis not present

## 2015-01-28 DIAGNOSIS — K55069 Acute infarction of intestine, part and extent unspecified: Secondary | ICD-10-CM

## 2015-01-28 DIAGNOSIS — I4891 Unspecified atrial fibrillation: Secondary | ICD-10-CM | POA: Diagnosis not present

## 2015-01-28 DIAGNOSIS — K7689 Other specified diseases of liver: Secondary | ICD-10-CM | POA: Diagnosis not present

## 2015-01-28 DIAGNOSIS — K550 Acute vascular disorders of intestine: Secondary | ICD-10-CM

## 2015-01-28 DIAGNOSIS — N281 Cyst of kidney, acquired: Secondary | ICD-10-CM | POA: Diagnosis not present

## 2015-01-28 LAB — POCT INR: INR: 2

## 2015-01-28 MED ORDER — WARFARIN SODIUM 3 MG PO TABS: ORAL_TABLET | ORAL | Status: DC

## 2015-01-28 MED ORDER — IOHEXOL 300 MG/ML  SOLN
100.0000 mL | Freq: Once | INTRAMUSCULAR | Status: AC | PRN
  Administered 2015-01-28: 100 mL via INTRAVENOUS

## 2015-02-25 ENCOUNTER — Ambulatory Visit (INDEPENDENT_AMBULATORY_CARE_PROVIDER_SITE_OTHER): Payer: Medicare Other | Admitting: Pharmacist

## 2015-02-25 DIAGNOSIS — I4891 Unspecified atrial fibrillation: Secondary | ICD-10-CM | POA: Diagnosis not present

## 2015-02-25 DIAGNOSIS — K550 Acute vascular disorders of intestine: Secondary | ICD-10-CM | POA: Diagnosis not present

## 2015-02-25 DIAGNOSIS — K661 Hemoperitoneum: Secondary | ICD-10-CM

## 2015-02-25 DIAGNOSIS — Z7901 Long term (current) use of anticoagulants: Secondary | ICD-10-CM

## 2015-02-25 DIAGNOSIS — K55069 Acute infarction of intestine, part and extent unspecified: Secondary | ICD-10-CM

## 2015-02-25 LAB — POCT INR: INR: 1.7

## 2015-03-26 ENCOUNTER — Encounter: Payer: Self-pay | Admitting: Nurse Practitioner

## 2015-03-26 ENCOUNTER — Ambulatory Visit (INDEPENDENT_AMBULATORY_CARE_PROVIDER_SITE_OTHER): Payer: Medicare Other | Admitting: Nurse Practitioner

## 2015-03-26 ENCOUNTER — Ambulatory Visit (INDEPENDENT_AMBULATORY_CARE_PROVIDER_SITE_OTHER): Payer: Medicare Other | Admitting: *Deleted

## 2015-03-26 VITALS — BP 138/80 | HR 90 | Ht 65.5 in | Wt 152.0 lb

## 2015-03-26 DIAGNOSIS — I482 Chronic atrial fibrillation, unspecified: Secondary | ICD-10-CM

## 2015-03-26 DIAGNOSIS — K550 Acute vascular disorders of intestine: Secondary | ICD-10-CM | POA: Diagnosis not present

## 2015-03-26 DIAGNOSIS — I4891 Unspecified atrial fibrillation: Secondary | ICD-10-CM | POA: Diagnosis not present

## 2015-03-26 DIAGNOSIS — K661 Hemoperitoneum: Secondary | ICD-10-CM | POA: Diagnosis not present

## 2015-03-26 DIAGNOSIS — Z7901 Long term (current) use of anticoagulants: Secondary | ICD-10-CM | POA: Diagnosis not present

## 2015-03-26 DIAGNOSIS — K55069 Acute infarction of intestine, part and extent unspecified: Secondary | ICD-10-CM

## 2015-03-26 LAB — CBC
HCT: 44.4 % (ref 36.0–46.0)
Hemoglobin: 14.9 g/dL (ref 12.0–15.0)
MCHC: 33.6 g/dL (ref 30.0–36.0)
MCV: 93.7 fl (ref 78.0–100.0)
Platelets: 224 10*3/uL (ref 150.0–400.0)
RBC: 4.74 Mil/uL (ref 3.87–5.11)
RDW: 14 % (ref 11.5–15.5)
WBC: 7.1 10*3/uL (ref 4.0–10.5)

## 2015-03-26 LAB — BASIC METABOLIC PANEL
BUN: 18 mg/dL (ref 6–23)
CO2: 29 mEq/L (ref 19–32)
Calcium: 9.6 mg/dL (ref 8.4–10.5)
Chloride: 102 mEq/L (ref 96–112)
Creatinine, Ser: 0.74 mg/dL (ref 0.40–1.20)
GFR: 79.27 mL/min (ref >60.00)
Glucose, Bld: 84 mg/dL (ref 70–99)
Potassium: 4.3 mEq/L (ref 3.5–5.1)
Sodium: 138 mEq/L (ref 135–145)

## 2015-03-26 LAB — POCT INR: INR: 1.6

## 2015-03-26 NOTE — Patient Instructions (Addendum)
We will be checking the following labs today - BMET and CBC  Medication Instructions:    Continue with your current medicines.     Testing/Procedures To Be Arranged:  N/A  Follow-Up:   See me in one year with EKG.     Other Special Instructions:   N/A  Call the [place] office at [phone] if you have any questions, problems or concerns.

## 2015-03-26 NOTE — Progress Notes (Signed)
CARDIOLOGY OFFICE NOTE  Date:  03/26/2015    Lisa Douglas Date of Birth: Mar 23, 1928 Medical Record #[id number]  PCP:  Garret Reddish, MD  Cardiologist:  Aundra Dubin    Chief Complaint  Patient presents with  . Atrial Fibrillation    1 year check - seen for Dr. Aundra Dubin    History of Present Illness: Lisa Douglas is a 81 y.o. female who presents today for a one year check. Seen for Dr. Aundra Dubin. She has a history of chronic atrial fibrillation and prior embolic SMA occlusion requiring embolectomy (off coumadin at the time). She has a history of GI bleeding from gastric AVMs. Previously followed by  Dr. Haroldine Laws in the past and saw Dr. Aundra Dubin for the first time in June of 2015.    Comes in today. Here alone. Doing ok. Little frustrated about trying to get an appointment so her coumadin would be refilled. She has had a foot fracture going to the mailbox - slipped on the last step - this was back in December. She has had no chest pain. Not short of breath. Not dizzy or lightheaded. No syncope. Heart rate usually between 70 and 80. Still pretty active - does all her housework. Great great niece lives with her.   PMH: 1. Hyperlipidemia 2. Gastric AVMs with history of GI bleeding. 3. GERD 4. Atrial fibrillation: Chronic. She had suspected cardioembolic SMA occlusion in 7/13 requiring SMA embolectomy. Coumadin begun at this time.  5. Macular degeneration. 6. Diverticulosis. 7. Echo (7/13) with EF 60%, mild MR.    Past Medical History  Diagnosis Date  . Macular degeneration (senile) of retina, unspecified   . Atrial fibrillation   . Unspecified venous (peripheral) insufficiency   . Other and unspecified hyperlipidemia   . Esophageal reflux   . Diverticulosis of colon (without mention of hemorrhage)   . Angiodysplasia of intestine (without mention of hemorrhage)   . Osteoarthrosis, unspecified whether generalized or localized, unspecified site   . Osteoporosis, unspecified   .  Anxiety state, unspecified   . Mesenteric embolus   . 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 Not current problem.    . Diverticulosis of colon with hemorrhage 06/12/2014    Past Surgical History  Procedure Laterality Date  . Appendectomy  1951  . Tonsillectomy  1943  . Laparotomy  03/07/2012    Procedure: EXPLORATORY LAPAROTOMY;  Surgeon: Angelia Mould, MD;  Location[facility] OR;  Service: Vascular;  Laterality: N/A;  Exploratory Laparotomy with Evacuation of Hematoma.     Medications: Current Outpatient Prescriptions  Medication Sig Dispense Refill  . ALPRAZolam (XANAX) 0.5 MG tablet Take 0.5 tablets (0.25 mg total) by mouth 3 (three) times daily as needed. For anxiety 90 tablet 5  . CARTIA XT 180 MG 24 hr capsule TAKE ONE CAPSULE BY MOUTH ONCE DAILY 30 capsule 6  . Cholecalciferol (VITAMIN D) 2000 UNITS CAPS Take 1 capsule by mouth daily.    Marland Kitchen esomeprazole (NEXIUM) 20 MG capsule Take 20 mg by mouth daily at 12 noon.    . metoprolol succinate (TOPROL-XL) 25 MG 24 hr tablet TAKE ONE TABLET BY MOUTH ONCE DAILY 30 tablet 6  . Multiple Vitamin (MULTIVITAMIN WITH MINERALS) TABS Take 1 tablet by mouth daily.    . multivitamin-lutein (OCUVITE-LUTEIN) CAPS Take 1 capsule by mouth daily.    . simvastatin (ZOCOR) 20 MG tablet TAKE ONE TABLET BY MOUTH AT BEDTIME 30 tablet  11  . triamcinolone cream (KENALOG) 0.1 % Apply 1 application topically as needed (itching and bumps).     . warfarin (COUMADIN) 3 MG tablet Take as directed by coumadin clinic 20 tablet 3  . [DISCONTINUED] diltiazem (DILACOR XR) 180 MG 24 hr capsule Take 1 capsule (180 mg total) by mouth daily. 30 capsule 11   No current facility-administered medications for this visit.    Allergies: Allergies  Allergen Reactions  . Penicillins     angioedema  . Alendronate Sodium     REACTION: INTOL to Fosamax per pt.  . Atorvastatin     REACTION: INTOL to Lipitor per  pt.  . Morphine And Related     Makes her mean per pt    Social History: The patient  reports that she quit smoking about 63 years ago. Her smoking use included Cigarettes. She has a .5 pack-year smoking history. She has never used smokeless tobacco. She reports that she does not drink alcohol or use illicit drugs.   Family History: The patient's family history includes Breast cancer in her sister; Heart disease in her sister; Hemochromatosis in her father; Lymphoma in her sister.   Review of Systems: Please see the history of present illness.   Otherwise, the review of systems is positive for none.   All other systems are reviewed and negative.   Physical Exam: VS:  BP 138/80 mmHg  Pulse 90  Ht 5' 5.5" (1.664 m)  Wt 152 lb (68.947 kg)  BMI 24.90 kg/m2 .  BMI Body mass index is 24.9 kg/(m^2).  Wt Readings from Last 3 Encounters:  03/26/15 152 lb (68.947 kg)  01/21/15 153 lb (69.4 kg)  12/23/14 152 lb (68.947 kg)    General: Pleasant. Elderly female who is alert and in no acute distress.  HEENT: Normal. Neck: Supple, no JVD, carotid bruits, or masses noted.  Cardiac: Irregular irregular rhythm. Rate is ok.  No edema.  Respiratory:  Lungs are clear to auscultation bilaterally with normal work of breathing.  GI: Soft and nontender.  MS: No deformity or atrophy. Gait and ROM intact. Skin: Warm and dry. Color is normal.  Neuro:  Strength and sensation are intact and no gross focal deficits noted.  Psych: Alert, appropriate and with normal affect.   LABORATORY DATA:  EKG:  EKG is ordered today. This demonstrates atrial fib.  Lab Results  Component Value Date   WBC 9.3 06/17/2014   HGB 14.6 06/17/2014   HCT 44.6 06/17/2014   PLT 380.0 06/17/2014   GLUCOSE 76 01/22/2015   CHOL 198 11/06/2013   TRIG 130.0 11/06/2013   HDL 55.70 11/06/2013   LDLCALC 116* 11/06/2013   ALT 15 06/12/2014   AST 20 06/12/2014   NA 139 01/22/2015   K 4.1 01/22/2015   CL 102 01/22/2015    CREATININE 0.81 01/22/2015   BUN 15 01/22/2015   CO2 33* 01/22/2015   TSH 2.71 11/06/2013   INR 1.6 03/26/2015   Lab Results  Component Value Date   INR 1.6 03/26/2015   INR 1.7 02/25/2015   INR 2.0 01/28/2015    BNP (last 3 results) No results for input(s): BNP in the last 8760 hours.  ProBNP (last 3 results) No results for input(s): PROBNP in the last 8760 hours.   Other Studies Reviewed Today:  Echo Study Conclusions from 02/2012  - Left ventricle: The cavity size was normal. Wall thickness was normal. The estimated ejection fraction was 60%. Wall motion was normal; there were  no regional wall motion abnormalities. - Mitral valve: Mild regurgitation. - Left atrium: The atrium was mildly dilated. - Right atrium: The atrium was mildly dilated. - Pulmonary arteries: PA peak pressure: 70mm Hg (S).  Assessment/Plan: 1. Atrial fibrillation: Chronic. Remains on Toprol XL and diltiazem CD. Given history of embolic SMA occlusion when off warfarin in the past, she will need Lovenox bridge if she needs to stop warfarin in the future. She is doing well on her current regimen.   2. History of gastric AVMs: No bleeding on coumadin so far. Would like to get her lab rechecked today.   3. Advanced age  Current medicines are reviewed with the patient today.  The patient does not have concerns regarding medicines other than what has been noted above.  The following changes have been made:  See above.  Labs/ tests ordered today include:   No orders of the defined types were placed in this encounter.     Disposition:   FU with me in 1 year.   Patient is agreeable to this plan and will call if any problems develop in the interim.   Signed: Burtis Junes, RN, ANP-C 03/26/2015 2:47 PM  [facility] [address] [place], [place]  [address] Phone: [phone] Fax: [phone]

## 2015-04-08 ENCOUNTER — Ambulatory Visit (INDEPENDENT_AMBULATORY_CARE_PROVIDER_SITE_OTHER): Payer: Medicare Other | Admitting: *Deleted

## 2015-04-08 DIAGNOSIS — K550 Acute vascular disorders of intestine: Secondary | ICD-10-CM | POA: Diagnosis not present

## 2015-04-08 DIAGNOSIS — Z7901 Long term (current) use of anticoagulants: Secondary | ICD-10-CM | POA: Diagnosis not present

## 2015-04-08 DIAGNOSIS — I4891 Unspecified atrial fibrillation: Secondary | ICD-10-CM | POA: Diagnosis not present

## 2015-04-08 DIAGNOSIS — K661 Hemoperitoneum: Secondary | ICD-10-CM | POA: Diagnosis not present

## 2015-04-08 DIAGNOSIS — K55069 Acute infarction of intestine, part and extent unspecified: Secondary | ICD-10-CM

## 2015-04-08 LAB — POCT INR: INR: 2.5

## 2015-04-29 ENCOUNTER — Encounter: Payer: Self-pay | Admitting: Family Medicine

## 2015-04-29 ENCOUNTER — Ambulatory Visit (INDEPENDENT_AMBULATORY_CARE_PROVIDER_SITE_OTHER): Payer: Medicare Other | Admitting: *Deleted

## 2015-04-29 ENCOUNTER — Ambulatory Visit (INDEPENDENT_AMBULATORY_CARE_PROVIDER_SITE_OTHER): Payer: Medicare Other | Admitting: Family Medicine

## 2015-04-29 VITALS — BP 130/70 | HR 92 | Temp 98.1°F | Wt 154.0 lb

## 2015-04-29 DIAGNOSIS — I482 Chronic atrial fibrillation, unspecified: Secondary | ICD-10-CM

## 2015-04-29 DIAGNOSIS — Z7901 Long term (current) use of anticoagulants: Secondary | ICD-10-CM

## 2015-04-29 DIAGNOSIS — K683 Retroperitoneal hematoma: Secondary | ICD-10-CM

## 2015-04-29 DIAGNOSIS — Z0000 Encounter for general adult medical examination without abnormal findings: Secondary | ICD-10-CM | POA: Diagnosis not present

## 2015-04-29 DIAGNOSIS — K55069 Acute infarction of intestine, part and extent unspecified: Secondary | ICD-10-CM

## 2015-04-29 DIAGNOSIS — Z23 Encounter for immunization: Secondary | ICD-10-CM

## 2015-04-29 DIAGNOSIS — E785 Hyperlipidemia, unspecified: Secondary | ICD-10-CM

## 2015-04-29 DIAGNOSIS — K661 Hemoperitoneum: Secondary | ICD-10-CM

## 2015-04-29 DIAGNOSIS — I4891 Unspecified atrial fibrillation: Secondary | ICD-10-CM

## 2015-04-29 DIAGNOSIS — K550 Acute vascular disorders of intestine: Secondary | ICD-10-CM

## 2015-04-29 LAB — HEPATIC FUNCTION PANEL
ALT: 16 U/L (ref 0–35)
AST: 22 U/L (ref 0–37)
Albumin: 4.1 g/dL (ref 3.5–5.2)
Alkaline Phosphatase: 58 U/L (ref 39–117)
BILIRUBIN TOTAL: 0.8 mg/dL (ref 0.2–1.2)
Bilirubin, Direct: 0.2 mg/dL (ref 0.0–0.3)
Total Protein: 6.8 g/dL (ref 6.0–8.3)

## 2015-04-29 LAB — LIPID PANEL
CHOL/HDL RATIO: 3
Cholesterol: 141 mg/dL (ref 0–200)
HDL: 54.7 mg/dL (ref >39.00)
LDL CALC: 64 mg/dL (ref 0–99)
NonHDL: 86.01
TRIGLYCERIDES: 112 mg/dL (ref 0.0–149.0)
VLDL: 22.4 mg/dL (ref 0.0–40.0)

## 2015-04-29 LAB — POCT INR: INR: 2.2

## 2015-04-29 NOTE — Progress Notes (Signed)
Lisa Reddish, MD Phone: [phone]  Subjective:  Patient presents today for their annual wellness visit.    Preventive Screening-Counseling & Management  Smoking Status: former Smoker Second Hand Smoking status: No smokers in home  Risk Factors Regular exercise: not in summer due to hot, plans to restart walking Diet: admits to some poor choices, is going to work on overeating  Fall Risk: Minor, has to avoid being out when cold and dark, otherwise does well   Cardiac risk factors:  advanced age  Hyperlipidemia - mild elevations- primary prevention not with known benefit compared to risk at age but on statin  No diabetes.  Family History: sister with heart disese   Depression Screen None. PHQ2 0   Activities of Daily Living Independent ADLs and IADLs   Hearing Difficulties: -patient endorses, refuses to have hearing aids  Cognitive Testing No reported trouble.   Normal 3 word recall  HCPOA is her niece States DNR/DNI desires- advised her to discuss with niece Does not want yellow form  List the Names of Other Physician/Practitioners you currently use: 1.Dr. Aundra Dubin with Cardiology 2. Optho at [facility]  Immunization History  Administered Date(s) Administered  . Influenza Split 05/26/2011, 06/02/2012  . Influenza Whole 05/15/2008, 05/21/2009, 05/27/2010  . Influenza,inj,Quad PF,36+ Mos 05/09/2013, 05/23/2014, 04/29/2015  . Pneumococcal Conjugate-13 10/29/2014  . Pneumococcal Polysaccharide-23 11/03/2012  . Td 03/31/2010  . Zoster 09/24/2011   Required Immunizations needed today : already given flu  Screening tests- up to date There are no preventive care reminders to display for this patient.  ROS- No pertinent positives discovered in course of AWV Pertinent negatives- no chest pain, shortness of breath, vomiting. Continues to have some gassy feelings and intermittent mild abdominal pain.   The following were reviewed and entered/updated  in epic: Past Medical History  Diagnosis Date  . Macular degeneration (senile) of retina, unspecified   . Atrial fibrillation   . Unspecified venous (peripheral) insufficiency   . Other and unspecified hyperlipidemia   . Esophageal reflux   . Diverticulosis of colon (without mention of hemorrhage)   . Angiodysplasia of intestine (without mention of hemorrhage)   . Osteoarthrosis, unspecified whether generalized or localized, unspecified site   . Osteoporosis, unspecified   . Anxiety state, unspecified   . Mesenteric embolus   . 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 Not current problem.    . Diverticulosis of colon with hemorrhage 06/12/2014   Patient Active Problem List   Diagnosis Date Noted  . ATRIAL FIBRILLATION, CHRONIC 10/03/2007    Priority: High  . 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 ANXIETY 10/03/2007    Priority: Medium  . OSTEOPOROSIS 10/03/2007    Priority: Medium  . Hyperlipidemia 10/02/2007    Priority: Medium  . Angiodysplasia of colon 06/01/2004    Priority: Medium  . Epigastric hernia 12/23/2014    Priority: Low  . Diverticulitis of colon 06/12/2014    Priority: Low  . CKD (chronic kidney disease), stage II 03/26/2014    Priority: Low  . Encounter for long-term (current) use of anticoagulants 03/20/2012    Priority: Low  . Leg cramps 12/30/2011    Priority: Low  . Vitamin D deficiency 04/01/2011    Priority: Low  . MACULAR DEGENERATION 10/03/2007    Priority: Low  . VENOUS INSUFFICIENCY 10/03/2007    Priority: Low  . GERD 10/03/2007    Priority: Low  .  DEGENERATIVE JOINT DISEASE 10/03/2007    Priority: Low   Past Surgical History  Procedure Laterality Date  . Appendectomy  1951  . Tonsillectomy  1943  . Laparotomy  03/07/2012    Procedure: EXPLORATORY LAPAROTOMY;  Surgeon: Angelia Mould, MD;  Location: [facility] OR;  Service: Vascular;   Laterality: N/A;  Exploratory Laparotomy with Evacuation of Hematoma.    Family History  Problem Relation Age of Onset  . Heart disease Sister   . Breast cancer Sister     died 22, not sure when she had it  . Lymphoma Sister   . Hemochromatosis Father     tested in [place], denies history    Medications- reviewed and updated Current Outpatient Prescriptions  Medication Sig Dispense Refill  . CARTIA XT 180 MG 24 hr capsule TAKE ONE CAPSULE BY MOUTH ONCE DAILY 30 capsule 6  . Cholecalciferol (VITAMIN D) 2000 UNITS CAPS Take 1 capsule by mouth daily.    Marland Kitchen esomeprazole (NEXIUM) 20 MG capsule Take 20 mg by mouth daily at 12 noon.    . metoprolol succinate (TOPROL-XL) 25 MG 24 hr tablet TAKE ONE TABLET BY MOUTH ONCE DAILY 30 tablet 6  . Multiple Vitamin (MULTIVITAMIN WITH MINERALS) TABS Take 1 tablet by mouth daily.    . multivitamin-lutein (OCUVITE-LUTEIN) CAPS Take 1 capsule by mouth daily.    . simvastatin (ZOCOR) 20 MG tablet TAKE ONE TABLET BY MOUTH AT BEDTIME 30 tablet 11  . warfarin (COUMADIN) 3 MG tablet Take as directed by coumadin clinic 20 tablet 3  . ALPRAZolam (XANAX) 0.5 MG tablet Take 0.5 tablets (0.25 mg total) by mouth 3 (three) times daily as needed. For anxiety (Patient not taking: Reported on 04/29/2015) 90 tablet 5  . triamcinolone cream (KENALOG) 0.1 % Apply 1 application topically as needed (itching and bumps).      Allergies-reviewed and updated Allergies  Allergen Reactions  . Penicillins     angioedema  . Alendronate Sodium     REACTION: INTOL to Fosamax per pt.  . Atorvastatin     REACTION: INTOL to Lipitor per pt.  . Morphine And Related     Makes her mean per pt    Social History   Social History  . Marital Status: Single    Spouse Name: N/A  . Number of Children: N/A  . Years of Education: N/A   Occupational History  . retired    Social History Main Topics  . Smoking status: Former Smoker -- 0.25 packs/day for 2 years    Types: Cigarettes     Quit date: 08/24/1951  . Smokeless tobacco: Never Used     Comment: smoked at age 51 for a short time--a few months  . Alcohol Use: No  . Drug Use: No  . Sexual Activity: Not Asked   Other Topics Concern  . None   Social History Narrative   Born and raised in [place]. Lived on campus of [place].    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    Lives with college age student that she helped raise.    Never married. No kids. 1 sister living (out of 8), a lot of contact with nieces and nephes.       Hobbies: used to sew and knit (vision issues now), used to bowl  Now enjoys computer time-keeps up with family, not a big tv time    Objective: BP 130/70 mmHg  Pulse 92  Temp(Src) 98.1 F (36.7 C)  Wt 154 lb (69.854 kg) Gen: NAD, resting comfortably HEENT: Mucous membranes are moist. Oropharynx normal CV: RRR no murmurs rubs or gallops Lungs: CTAB no crackles, wheeze, rhonchi Abdomen: soft/nontender/nondistended/normal bowel sounds. No rebound or guarding.  Ext: no edema Skin: warm, dry, no rash Neuro: grossly normal, moves all extremities, PERRLA  Assessment/Plan:  AWV- only concern is lack of exercise, refuses hearing eval or aids.   ATRIAL FIBRILLATION, CHRONIC S: rate controlled. Compliant with coumadin, diltiazem, and metoprolol. Sees cardiology Dr. Aundra Dubin A/P: continue current rx   Superior mesenteric artery thrombosis S: no recurrence. Compliant with coumadin. Had CT with contrast within 6 months for abdominal pain A/P: continue coumadin   Hyperlipidemia S: compliant again with statin. LDL was elevated >100 off statin. Hopeful improved control now A/P: check lipids today   4-6 month f/u  Orders Placed This Encounter  Procedures  . Flu Vaccine QUAD 36+ mos IM  . Hepatic Function Panel  . Lipid panel     [place]    Order Specific Question:  Has the patient fasted?    Answer:  No

## 2015-04-29 NOTE — Assessment & Plan Note (Signed)
S: no recurrence. Compliant with coumadin. Had CT with contrast within 6 months for abdominal pain A/P: continue coumadin

## 2015-04-29 NOTE — Assessment & Plan Note (Addendum)
S: compliant again with statin. LDL was elevated >100 off statin. Hopeful improved control now A/P: check lipids today

## 2015-04-29 NOTE — Patient Instructions (Addendum)
  Ms. Scheibe , Thank you for taking time to come for your Medicare Wellness Visit. I appreciate your ongoing commitment to your health goals. Please review the following plan we discussed and let me know if I can assist you in the future.   These are the goals we discussed: 1. Try to walk some daily even if just 10 minutes. "If you don't move it, you lose it" 2. Check in 4-6 months from now with me or sooner if you need [place]   This is a list of the screening recommended for you and due dates:  Health Maintenance  Topic Date Due  . Flu Shot  03/23/2016  . Tetanus Vaccine  03/31/2020  . DEXA scan (bone density measurement)  Completed  . Shingles Vaccine  Completed  . Pneumonia vaccines  Completed

## 2015-04-29 NOTE — Assessment & Plan Note (Signed)
S: rate controlled. Compliant with coumadin, diltiazem, and metoprolol. Sees cardiology Dr. Aundra Dubin A/P: continue current rx

## 2015-04-30 ENCOUNTER — Other Ambulatory Visit: Payer: Self-pay | Admitting: Family Medicine

## 2015-05-10 ENCOUNTER — Other Ambulatory Visit: Payer: Self-pay | Admitting: Cardiology

## 2015-05-15 DIAGNOSIS — H3531 Nonexudative age-related macular degeneration: Secondary | ICD-10-CM | POA: Diagnosis not present

## 2015-05-15 DIAGNOSIS — H2513 Age-related nuclear cataract, bilateral: Secondary | ICD-10-CM | POA: Diagnosis not present

## 2015-05-15 DIAGNOSIS — H43813 Vitreous degeneration, bilateral: Secondary | ICD-10-CM | POA: Diagnosis not present

## 2015-05-15 DIAGNOSIS — H35342 Macular cyst, hole, or pseudohole, left eye: Secondary | ICD-10-CM | POA: Diagnosis not present

## 2015-05-27 ENCOUNTER — Ambulatory Visit (INDEPENDENT_AMBULATORY_CARE_PROVIDER_SITE_OTHER): Payer: Medicare Other | Admitting: *Deleted

## 2015-05-27 DIAGNOSIS — Z7901 Long term (current) use of anticoagulants: Secondary | ICD-10-CM | POA: Diagnosis not present

## 2015-05-27 DIAGNOSIS — I4891 Unspecified atrial fibrillation: Secondary | ICD-10-CM | POA: Diagnosis not present

## 2015-05-27 DIAGNOSIS — K55069 Acute infarction of intestine, part and extent unspecified: Secondary | ICD-10-CM

## 2015-05-27 DIAGNOSIS — K661 Hemoperitoneum: Secondary | ICD-10-CM

## 2015-05-27 DIAGNOSIS — I482 Chronic atrial fibrillation, unspecified: Secondary | ICD-10-CM

## 2015-05-27 LAB — POCT INR: INR: 3

## 2015-06-10 ENCOUNTER — Ambulatory Visit (INDEPENDENT_AMBULATORY_CARE_PROVIDER_SITE_OTHER): Payer: Medicare Other | Admitting: Pharmacist

## 2015-06-10 DIAGNOSIS — I482 Chronic atrial fibrillation, unspecified: Secondary | ICD-10-CM

## 2015-06-10 DIAGNOSIS — Z85820 Personal history of malignant melanoma of skin: Secondary | ICD-10-CM | POA: Diagnosis not present

## 2015-06-10 DIAGNOSIS — Z7901 Long term (current) use of anticoagulants: Secondary | ICD-10-CM | POA: Diagnosis not present

## 2015-06-10 DIAGNOSIS — I4891 Unspecified atrial fibrillation: Secondary | ICD-10-CM

## 2015-06-10 DIAGNOSIS — K661 Hemoperitoneum: Secondary | ICD-10-CM

## 2015-06-10 DIAGNOSIS — L821 Other seborrheic keratosis: Secondary | ICD-10-CM | POA: Diagnosis not present

## 2015-06-10 DIAGNOSIS — K55069 Acute infarction of intestine, part and extent unspecified: Secondary | ICD-10-CM | POA: Diagnosis not present

## 2015-06-10 DIAGNOSIS — D1801 Hemangioma of skin and subcutaneous tissue: Secondary | ICD-10-CM | POA: Diagnosis not present

## 2015-06-10 DIAGNOSIS — D2262 Melanocytic nevi of left upper limb, including shoulder: Secondary | ICD-10-CM | POA: Diagnosis not present

## 2015-06-10 LAB — POCT INR: INR: 2.5

## 2015-06-22 ENCOUNTER — Emergency Department (HOSPITAL_COMMUNITY): Payer: Medicare Other

## 2015-06-22 ENCOUNTER — Emergency Department (HOSPITAL_COMMUNITY)
Admission: EM | Admit: 2015-06-22 | Discharge: 2015-06-22 | Disposition: A | Discharge status: Home / Self Care | Payer: Medicare Other | Attending: Emergency Medicine | Admitting: Emergency Medicine

## 2015-06-22 ENCOUNTER — Encounter (HOSPITAL_COMMUNITY): Payer: Self-pay | Admitting: Family Medicine

## 2015-06-22 DIAGNOSIS — Y9389 Activity, other specified: Secondary | ICD-10-CM | POA: Diagnosis not present

## 2015-06-22 DIAGNOSIS — Z880 Allergy status to penicillin: Secondary | ICD-10-CM | POA: Insufficient documentation

## 2015-06-22 DIAGNOSIS — D62 Acute posthemorrhagic anemia: Secondary | ICD-10-CM | POA: Insufficient documentation

## 2015-06-22 DIAGNOSIS — F419 Anxiety disorder, unspecified: Secondary | ICD-10-CM | POA: Insufficient documentation

## 2015-06-22 DIAGNOSIS — Y9289 Other specified places as the place of occurrence of the external cause: Secondary | ICD-10-CM | POA: Diagnosis not present

## 2015-06-22 DIAGNOSIS — Z87891 Personal history of nicotine dependence: Secondary | ICD-10-CM | POA: Insufficient documentation

## 2015-06-22 DIAGNOSIS — M79645 Pain in left finger(s): Secondary | ICD-10-CM | POA: Diagnosis not present

## 2015-06-22 DIAGNOSIS — K219 Gastro-esophageal reflux disease without esophagitis: Secondary | ICD-10-CM | POA: Insufficient documentation

## 2015-06-22 DIAGNOSIS — S60032A Contusion of left middle finger without damage to nail, initial encounter: Secondary | ICD-10-CM | POA: Insufficient documentation

## 2015-06-22 DIAGNOSIS — Z7901 Long term (current) use of anticoagulants: Secondary | ICD-10-CM | POA: Diagnosis not present

## 2015-06-22 DIAGNOSIS — W01198A Fall on same level from slipping, tripping and stumbling with subsequent striking against other object, initial encounter: Secondary | ICD-10-CM | POA: Insufficient documentation

## 2015-06-22 DIAGNOSIS — M1990 Unspecified osteoarthritis, unspecified site: Secondary | ICD-10-CM | POA: Diagnosis not present

## 2015-06-22 DIAGNOSIS — S6000XA Contusion of unspecified finger without damage to nail, initial encounter: Secondary | ICD-10-CM

## 2015-06-22 DIAGNOSIS — S0093XA Contusion of unspecified part of head, initial encounter: Secondary | ICD-10-CM | POA: Diagnosis not present

## 2015-06-22 DIAGNOSIS — Z8669 Personal history of other diseases of the nervous system and sense organs: Secondary | ICD-10-CM | POA: Insufficient documentation

## 2015-06-22 DIAGNOSIS — Z79899 Other long term (current) drug therapy: Secondary | ICD-10-CM | POA: Insufficient documentation

## 2015-06-22 DIAGNOSIS — E785 Hyperlipidemia, unspecified: Secondary | ICD-10-CM | POA: Diagnosis not present

## 2015-06-22 DIAGNOSIS — Y998 Other external cause status: Secondary | ICD-10-CM | POA: Insufficient documentation

## 2015-06-22 DIAGNOSIS — Z8679 Personal history of other diseases of the circulatory system: Secondary | ICD-10-CM | POA: Diagnosis not present

## 2015-06-22 DIAGNOSIS — S0083XA Contusion of other part of head, initial encounter: Secondary | ICD-10-CM | POA: Diagnosis not present

## 2015-06-22 DIAGNOSIS — S0990XA Unspecified injury of head, initial encounter: Secondary | ICD-10-CM | POA: Diagnosis present

## 2015-06-22 DIAGNOSIS — W19XXXA Unspecified fall, initial encounter: Secondary | ICD-10-CM

## 2015-06-22 DIAGNOSIS — S0081XA Abrasion of other part of head, initial encounter: Secondary | ICD-10-CM | POA: Diagnosis not present

## 2015-06-22 LAB — PROTIME-INR
INR: 2.42 — ABNORMAL HIGH (ref 0.00–1.49)
PROTHROMBIN TIME: 26 s — AB (ref 11.6–15.2)

## 2015-06-22 NOTE — ED Notes (Signed)
Patient states she was closing the blind, there was a box of video tapes at the blinds, she stepped into the box and fell. Pt reports hitting her head on a shelf. No LOC but has abrasion on the left temple area.

## 2015-06-22 NOTE — ED Provider Notes (Signed)
CSN: [id number]     Arrival date & time 06/22/15  1803 History   First MD Initiated Contact with Patient 06/22/15 1930     Chief Complaint  Patient presents with  . Fall     No language interpreter was used.   Lisa Douglas is an 81 yo woman on coumadin for afib here for evaluation of injuries following fall. She turned after closing the blinds and tripped over a box, falling and striking her head on a shelf. She denies any loss of consciousness, nausea, vomiting, numbness, weakness. She has been able to recent events. This happened just prior to ED arrival. Her Coumadin level was last checked 2 weeks ago. She has slight pain at temple where she struck her head. She had pain on her left third digit that is now resolved.  Past Medical History  Diagnosis Date  . Macular degeneration (senile) of retina, unspecified   . Atrial fibrillation ([place])   . Unspecified venous (peripheral) insufficiency   . Other and unspecified hyperlipidemia   . Esophageal reflux   . Diverticulosis of colon (without mention of hemorrhage)   . Angiodysplasia of intestine (without mention of hemorrhage)   . Osteoarthrosis, unspecified whether generalized or localized, unspecified site   . Osteoporosis, unspecified   . Anxiety state, unspecified   . Mesenteric embolus ([place])   . 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 Not current problem.    . Diverticulosis of colon with hemorrhage 06/12/2014   Past Surgical History  Procedure Laterality Date  . Appendectomy  1951  . Tonsillectomy  1943  . Laparotomy  03/07/2012    Procedure: EXPLORATORY LAPAROTOMY;  Surgeon: Angelia Mould, MD;  Location: [facility] OR;  Service: Vascular;  Laterality: N/A;  Exploratory Laparotomy with Evacuation of Hematoma.   Family History  Problem Relation Age of Onset  . Heart disease Sister   . Breast cancer Sister     died 94, not sure when she had it  . Lymphoma Sister   .  Hemochromatosis Father     tested in [place], denies history   Social History  Substance Use Topics  . Smoking status: Former Smoker -- 0.25 packs/day for 2 years    Types: Cigarettes    Quit date: 08/24/1951  . Smokeless tobacco: Never Used     Comment: smoked at age 43 for a short time--a few months  . Alcohol Use: No   OB History    No data available     Review of Systems  All other systems reviewed and are negative.     Allergies  Penicillins; Alendronate sodium; Atorvastatin; and Morphine and related  Home Medications   Prior to Admission medications   Medication Sig Start Date End Date Taking? Authorizing Provider  ALPRAZolam Duanne Moron) 0.5 MG tablet Take 0.5 tablets (0.25 mg total) by mouth 3 (three) times daily as needed. For anxiety Patient not taking: Reported on 04/29/2015 11/06/13   Noralee Space, MD  CARTIA XT 180 MG 24 hr capsule TAKE ONE CAPSULE BY MOUTH ONCE DAILY 05/12/15   Larey Dresser, MD  Cholecalciferol (VITAMIN D) 2000 UNITS CAPS Take 1 capsule by mouth daily.    Historical Provider, MD  esomeprazole (NEXIUM) 20 MG capsule Take 20 mg by mouth daily at 12 noon.    Historical Provider, MD  metoprolol succinate (TOPROL-XL) 25 MG 24 hr tablet TAKE ONE TABLET BY MOUTH ONCE DAILY 05/12/15   [place]  Claris Gladden, MD  Multiple Vitamin (MULTIVITAMIN WITH MINERALS) TABS Take 1 tablet by mouth daily.    Historical Provider, MD  multivitamin-lutein [facility]) CAPS Take 1 capsule by mouth daily.    Historical Provider, MD  simvastatin (ZOCOR) 20 MG tablet TAKE ONE TABLET BY MOUTH AT BEDTIME 04/30/15   Marin Olp, MD  triamcinolone cream (KENALOG) 0.1 % Apply 1 application topically as needed (itching and bumps).  01/03/15   Historical Provider, MD  warfarin (COUMADIN) 3 MG tablet Take as directed by coumadin clinic 01/28/15   Larey Dresser, MD   BP 135/62 mmHg  Pulse 77  Temp(Src) 97.5 F (36.4 C) (Oral)  Resp 13  Ht 5\' 5"  (1.651 m)  Wt 152 lb (68.947 kg)  BMI  25.29 kg/m2  SpO2 92% Physical Exam  Constitutional: She is oriented to person, place, and time. She appears well-developed and well-nourished.  HENT:  Head: Normocephalic.  Abrasion to left temple that is hemostatic.  Eyes: EOM are normal. Pupils are equal, round, and reactive to light.  Neck:  No C-spine tenderness  Cardiovascular: Normal rate and regular rhythm.   No murmur heard. Pulmonary/Chest: Effort normal and breath sounds normal. No respiratory distress.  Abdominal: Soft. There is no tenderness. There is no rebound and no guarding.  Musculoskeletal: She exhibits no edema or tenderness.  Plan ecchymosis about the left third PIP without any local tenderness. There is full range of motion throughout the hands.  Neurological: She is alert and oriented to person, place, and time.  5 out of 5 strength in all 4 extremities  Skin: Skin is warm and dry.  Psychiatric: She has a normal mood and affect. Her behavior is normal.  Nursing note and vitals reviewed.   ED Course  Procedures (including critical care time) Labs Review Labs Reviewed  PROTIME-INR - Abnormal; Notable for the following:    Prothrombin Time 26.0 (*)    INR 2.42 (*)    All other components within normal limits    Imaging Review Ct Head Wo Contrast  06/22/2015  CLINICAL DATA:  Stepped into box while closing blinds, and fell. Hit head on shelf. Abrasion at the left temple. Initial encounter. EXAM: CT HEAD WITHOUT CONTRAST TECHNIQUE: Contiguous axial images were obtained from the base of the skull through the vertex without intravenous contrast. COMPARISON:  None. FINDINGS: There is no evidence of acute infarction, mass lesion, or intra- or extra-axial hemorrhage on CT. Prominence of the sulci suggests mild cortical volume loss. Mild cerebellar atrophy is noted. A chronic infarct is noted at the right parietal lobe, extending to the right basal ganglia, with associated encephalomalacia. Scattered periventricular  white matter change likely reflects small vessel ischemic microangiopathy. The brainstem and fourth ventricle are within normal limits. No mass effect or midline shift is seen. There is no evidence of fracture; visualized osseous structures are unremarkable in appearance. The orbits are within normal limits. The paranasal sinuses and mastoid air cells are well-aerated. No significant soft tissue abnormalities are seen. IMPRESSION: 1. No acute intracranial pathology seen on CT. 2. Mild cortical volume loss and scattered small vessel ischemic microangiopathy. 3. 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 Electronically Signed   By: Garald Balding M.D.   On: 06/22/2015 20:51   Dg Finger Middle Left  06/22/2015  CLINICAL DATA:  Stepped into box while closing blinds, and fell. Pain at the left third finger. Initial encounter. EXAM: LEFT MIDDLE FINGER  2+V COMPARISON:  None. FINDINGS: There is no evidence of fracture or dislocation. The third finger appears intact. Visualized joint spaces are preserved. Soft tissue swelling is noted about the distal aspect of the third proximal phalanx. IMPRESSION: No evidence of fracture or dislocation. Electronically Signed   By: Garald Balding M.D.   On: 06/22/2015 20:18   I have personally reviewed and evaluated these images and lab results as part of my medical decision-making.   EKG Interpretation None      MDM   Final diagnoses:  Fall, initial encounter  Head contusion, initial encounter  Finger contusion, initial encounter    Patient here for evaluation of injuries on mechanical fall. Coumadin is therapeutic. No evidence of serious intracranial abnormality. Patient left the department prior to being able to update her findings of INR and imaging.    Quintella Reichert, MD 06/23/15 [phone]

## 2015-06-22 NOTE — Discharge Instructions (Signed)
Your INR was 2.4 today.      Facial or Scalp Contusion A facial or scalp contusion is a deep bruise on the face or head. Injuries to the face and head generally cause a lot of swelling, especially around the eyes. Contusions are the result of an injury that caused bleeding under the skin. The contusion may turn blue, purple, or yellow. Minor injuries will give you a painless contusion, but more severe contusions may stay painful and swollen for a few weeks.  CAUSES  A facial or scalp contusion is caused by a blunt injury or trauma to the face or head area.  SIGNS AND SYMPTOMS   Swelling of the injured area.   Discoloration of the injured area.   Tenderness, soreness, or pain in the injured area.  DIAGNOSIS  The diagnosis can be made by taking a medical history and doing a physical exam. An X-ray exam, CT scan, or MRI may be needed to determine if there are any associated injuries, such as broken bones (fractures). TREATMENT  Often, the best treatment for a facial or scalp contusion is applying cold compresses to the injured area. Over-the-counter medicines may also be recommended for pain control.  HOME CARE INSTRUCTIONS   Only take over-the-counter or prescription medicines as directed by your health care provider.   Apply ice to the injured area.   Put ice in a plastic bag.   Place a towel between your skin and the bag.   Leave the ice on for 20 minutes, 2-3 times a day.  SEEK MEDICAL CARE IF:  You have bite problems.   You have pain with chewing.   You are concerned about facial defects. SEEK IMMEDIATE MEDICAL CARE IF:  You have severe pain or a headache that is not relieved by medicine.   You have unusual sleepiness, confusion, or personality changes.   You throw up (vomit).   You have a persistent nosebleed.   You have double vision or blurred vision.   You have fluid drainage from your nose or ear.   You have difficulty walking or using your  arms or legs.  MAKE SURE YOU:   Understand these instructions.  Will watch your condition.  Will get help right away if you are not doing well or get worse.   This information is not intended to replace advice given to you by your health care provider. Make sure you discuss any questions you have with your health care provider.   Document Released: 09/16/2004 Document Revised: 08/30/2014 Document Reviewed: 03/22/2013 Elsevier Interactive Patient Education [employer].

## 2015-07-01 ENCOUNTER — Ambulatory Visit (INDEPENDENT_AMBULATORY_CARE_PROVIDER_SITE_OTHER): Payer: Medicare Other | Admitting: *Deleted

## 2015-07-01 DIAGNOSIS — I4891 Unspecified atrial fibrillation: Secondary | ICD-10-CM | POA: Diagnosis not present

## 2015-07-01 DIAGNOSIS — K661 Hemoperitoneum: Secondary | ICD-10-CM

## 2015-07-01 DIAGNOSIS — Z7901 Long term (current) use of anticoagulants: Secondary | ICD-10-CM | POA: Diagnosis not present

## 2015-07-01 DIAGNOSIS — K55069 Acute infarction of intestine, part and extent unspecified: Secondary | ICD-10-CM | POA: Diagnosis not present

## 2015-07-01 DIAGNOSIS — I482 Chronic atrial fibrillation, unspecified: Secondary | ICD-10-CM

## 2015-07-01 LAB — POCT INR: INR: 2.2

## 2015-07-14 ENCOUNTER — Encounter: Payer: Self-pay | Admitting: Internal Medicine

## 2015-07-29 ENCOUNTER — Ambulatory Visit (INDEPENDENT_AMBULATORY_CARE_PROVIDER_SITE_OTHER): Payer: Medicare Other | Admitting: Pharmacist

## 2015-07-29 DIAGNOSIS — K661 Hemoperitoneum: Secondary | ICD-10-CM

## 2015-07-29 DIAGNOSIS — K55069 Acute infarction of intestine, part and extent unspecified: Secondary | ICD-10-CM

## 2015-07-29 DIAGNOSIS — I4891 Unspecified atrial fibrillation: Secondary | ICD-10-CM | POA: Diagnosis not present

## 2015-07-29 DIAGNOSIS — Z7901 Long term (current) use of anticoagulants: Secondary | ICD-10-CM | POA: Diagnosis not present

## 2015-07-29 DIAGNOSIS — I482 Chronic atrial fibrillation, unspecified: Secondary | ICD-10-CM

## 2015-07-29 LAB — POCT INR: INR: 2.6

## 2015-08-26 ENCOUNTER — Ambulatory Visit (INDEPENDENT_AMBULATORY_CARE_PROVIDER_SITE_OTHER): Payer: Medicare Other | Admitting: *Deleted

## 2015-08-26 DIAGNOSIS — I482 Chronic atrial fibrillation, unspecified: Secondary | ICD-10-CM

## 2015-08-26 DIAGNOSIS — K55069 Acute infarction of intestine, part and extent unspecified: Secondary | ICD-10-CM | POA: Diagnosis not present

## 2015-08-26 DIAGNOSIS — K661 Hemoperitoneum: Secondary | ICD-10-CM

## 2015-08-26 DIAGNOSIS — Z7901 Long term (current) use of anticoagulants: Secondary | ICD-10-CM

## 2015-08-26 DIAGNOSIS — I4891 Unspecified atrial fibrillation: Secondary | ICD-10-CM

## 2015-08-26 LAB — POCT INR: INR: 2.6

## 2015-08-26 MED ORDER — WARFARIN SODIUM 3 MG PO TABS: ORAL_TABLET | ORAL | Status: DC

## 2015-09-04 ENCOUNTER — Encounter: Payer: Self-pay | Admitting: Gastroenterology

## 2015-09-09 ENCOUNTER — Ambulatory Visit (INDEPENDENT_AMBULATORY_CARE_PROVIDER_SITE_OTHER): Payer: Medicare Other | Admitting: Pharmacist

## 2015-09-09 DIAGNOSIS — K55069 Acute infarction of intestine, part and extent unspecified: Secondary | ICD-10-CM | POA: Diagnosis not present

## 2015-09-09 DIAGNOSIS — Z7901 Long term (current) use of anticoagulants: Secondary | ICD-10-CM | POA: Diagnosis not present

## 2015-09-09 DIAGNOSIS — I482 Chronic atrial fibrillation, unspecified: Secondary | ICD-10-CM

## 2015-09-09 DIAGNOSIS — K661 Hemoperitoneum: Secondary | ICD-10-CM | POA: Diagnosis not present

## 2015-09-09 DIAGNOSIS — I4891 Unspecified atrial fibrillation: Secondary | ICD-10-CM

## 2015-09-09 DIAGNOSIS — K683 Retroperitoneal hematoma: Secondary | ICD-10-CM

## 2015-09-09 LAB — POCT INR: INR: 2

## 2015-10-07 ENCOUNTER — Ambulatory Visit (INDEPENDENT_AMBULATORY_CARE_PROVIDER_SITE_OTHER): Payer: Medicare Other | Admitting: *Deleted

## 2015-10-07 DIAGNOSIS — Z7901 Long term (current) use of anticoagulants: Secondary | ICD-10-CM

## 2015-10-07 DIAGNOSIS — I4891 Unspecified atrial fibrillation: Secondary | ICD-10-CM

## 2015-10-07 DIAGNOSIS — K661 Hemoperitoneum: Secondary | ICD-10-CM | POA: Diagnosis not present

## 2015-10-07 DIAGNOSIS — I482 Chronic atrial fibrillation, unspecified: Secondary | ICD-10-CM

## 2015-10-07 DIAGNOSIS — K55069 Acute infarction of intestine, part and extent unspecified: Secondary | ICD-10-CM | POA: Diagnosis not present

## 2015-10-07 LAB — POCT INR: INR: 1.6

## 2015-10-21 ENCOUNTER — Ambulatory Visit (INDEPENDENT_AMBULATORY_CARE_PROVIDER_SITE_OTHER): Payer: Medicare Other | Admitting: *Deleted

## 2015-10-21 DIAGNOSIS — I4891 Unspecified atrial fibrillation: Secondary | ICD-10-CM | POA: Diagnosis not present

## 2015-10-21 DIAGNOSIS — K55069 Acute infarction of intestine, part and extent unspecified: Secondary | ICD-10-CM | POA: Diagnosis not present

## 2015-10-21 DIAGNOSIS — Z7901 Long term (current) use of anticoagulants: Secondary | ICD-10-CM

## 2015-10-21 DIAGNOSIS — K661 Hemoperitoneum: Secondary | ICD-10-CM

## 2015-10-21 DIAGNOSIS — I482 Chronic atrial fibrillation, unspecified: Secondary | ICD-10-CM

## 2015-10-21 LAB — POCT INR: INR: 2

## 2015-10-28 ENCOUNTER — Ambulatory Visit (INDEPENDENT_AMBULATORY_CARE_PROVIDER_SITE_OTHER): Payer: Medicare Other | Admitting: Family Medicine

## 2015-10-28 ENCOUNTER — Encounter: Payer: Self-pay | Admitting: Family Medicine

## 2015-10-28 VITALS — BP 120/70 | HR 80 | Temp 97.6°F | Wt 154.0 lb

## 2015-10-28 DIAGNOSIS — I482 Chronic atrial fibrillation, unspecified: Secondary | ICD-10-CM

## 2015-10-28 DIAGNOSIS — E559 Vitamin D deficiency, unspecified: Secondary | ICD-10-CM

## 2015-10-28 DIAGNOSIS — K55069 Acute infarction of intestine, part and extent unspecified: Secondary | ICD-10-CM | POA: Diagnosis not present

## 2015-10-28 DIAGNOSIS — E785 Hyperlipidemia, unspecified: Secondary | ICD-10-CM | POA: Diagnosis not present

## 2015-10-28 LAB — VITAMIN D 25 HYDROXY (VIT D DEFICIENCY, FRACTURES): VITD: 32.21 ng/mL (ref 30.00–100.00)

## 2015-10-28 NOTE — Assessment & Plan Note (Signed)
S: remains on anticoagulation. No mention of clot burden on last CT abdomen 01/2015 A/P: continue chronic anticoagulation- no recurrence o nthis

## 2015-10-28 NOTE — Assessment & Plan Note (Signed)
S: remains in a fib. Rate controlled on diltiazem and metoprolol. Anticoagulated with coumadin through cards clinic A/P: continue current medications- doing well. Continue cards follow up as well

## 2015-10-28 NOTE — Patient Instructions (Signed)
No change in medication today  Let's check back in 6 months from now  Want you to be very careful- make sure to keep your floors clean of objects to help avoid falls  Check vitamin D in lab before you go

## 2015-10-28 NOTE — Assessment & Plan Note (Signed)
S: history of low vit D- last close to 30 in 2010 A/P: repeat vitamin D today. Continue 2000 IU vit D per day

## 2015-10-28 NOTE — Assessment & Plan Note (Signed)
S: well controlled on simvastatin 20mg . No myalgias.  Lab Results  Component Value Date   CHOL 141 04/29/2015   HDL 54.70 04/29/2015   LDLCALC 64 04/29/2015   TRIG 112.0 04/29/2015   CHOLHDL 3 04/29/2015   A/P: continue current medication

## 2015-10-28 NOTE — Progress Notes (Signed)
Garret Reddish, MD  Subjective:  Lisa Douglas is a 82 y.o. year old very pleasant female patient who presents for/with See problem oriented charting ROS- no abdominal pain, nausea, vomiting. Had some bloating which has improved. No chest pain or shortness of breath. No headache or blurry vision.   Past Medical History-  Patient Active Problem List   Diagnosis Date Noted  . ATRIAL FIBRILLATION, CHRONIC 10/03/2007    Priority: High  . 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 ANXIETY 10/03/2007    Priority: Medium  . OSTEOPOROSIS 10/03/2007    Priority: Medium  . Hyperlipidemia 10/02/2007    Priority: Medium  . Angiodysplasia of colon 06/01/2004    Priority: Medium  . Epigastric hernia 12/23/2014    Priority: Low  . Diverticulitis of colon 06/12/2014    Priority: Low  . CKD (chronic kidney disease), stage II 03/26/2014    Priority: Low  . Encounter for long-term (current) use of anticoagulants 03/20/2012    Priority: Low  . Leg cramps 12/30/2011    Priority: Low  . Vitamin D deficiency 04/01/2011    Priority: Low  . MACULAR DEGENERATION 10/03/2007    Priority: Low  . VENOUS INSUFFICIENCY 10/03/2007    Priority: Low  . GERD 10/03/2007    Priority: Low  . DEGENERATIVE JOINT DISEASE 10/03/2007    Priority: Low    Medications- reviewed and updated Current Outpatient Prescriptions  Medication Sig Dispense Refill  . CARTIA XT 180 MG 24 hr capsule TAKE ONE CAPSULE BY MOUTH ONCE DAILY 30 capsule 10  . Cholecalciferol (VITAMIN D) 2000 UNITS CAPS Take 1 capsule by mouth daily.    . metoprolol succinate (TOPROL-XL) 25 MG 24 hr tablet TAKE ONE TABLET BY MOUTH ONCE DAILY 30 tablet 10  . Multiple Vitamin (MULTIVITAMIN WITH MINERALS) TABS Take 1 tablet by mouth daily.    . multivitamin-lutein (OCUVITE-LUTEIN) CAPS Take 1 capsule by mouth daily.    . simvastatin (ZOCOR) 20 MG tablet TAKE ONE TABLET BY MOUTH AT BEDTIME 30 tablet 6  . warfarin  (COUMADIN) 3 MG tablet Take as directed by coumadin clinic 20 tablet 3  . ALPRAZolam (XANAX) 0.5 MG tablet Take 0.5 tablets (0.25 mg total) by mouth 3 (three) times daily as needed. For anxiety (Patient not taking: Reported on 10/28/2015) 90 tablet 5  . esomeprazole (NEXIUM) 20 MG capsule Take 20 mg by mouth daily at 12 noon. Reported on 10/28/2015    . [DISCONTINUED] diltiazem (DILACOR XR) 180 MG 24 hr capsule Take 1 capsule (180 mg total) by mouth daily. 30 capsule 11   No current facility-administered medications for this visit.    Objective: BP 120/70 mmHg  Pulse 80  Temp(Src) 97.6 F (36.4 C)  Wt 154 lb (69.854 kg) Gen: NAD, resting comfortably CV: irregularly irregular no murmurs rubs or gallops Lungs: CTAB no crackles, wheeze, rhonchi Abdomen: soft/nontender/nondistended/normal bowel sounds.  Ext: no edema Skin: warm, dry, no rash Neuro: grossly normal, moves all extremities  Assessment/Plan:  Vitamin D deficiency S: history of low vit D- last close to 30 in 2010 A/P: repeat vitamin D today. Continue 2000 IU vit D per day   ATRIAL FIBRILLATION, CHRONIC S: remains in a fib. Rate controlled on diltiazem and metoprolol. Anticoagulated with coumadin through cards clinic A/P: continue current medications- doing well. Continue cards follow up as well   Hyperlipidemia S: well controlled on simvastatin 20mg . No myalgias.  Lab Results  Component Value Date   CHOL  141 04/29/2015   HDL 54.70 04/29/2015   LDLCALC 64 04/29/2015   TRIG 112.0 04/29/2015   CHOLHDL 3 04/29/2015   A/P: continue current medication    Superior mesenteric artery thrombosis S: remains on anticoagulation. No mention of clot burden on last CT abdomen 01/2015 A/P: continue chronic anticoagulation- no recurrence o nthis   Return in about 6 months (around 04/29/2016) for annual wellness visit. Return precautions advised.   Orders Placed This Encounter  Procedures  . VITAMIN D 25 Hydroxy (Vit-D  Deficiency, Fractures)    [place]

## 2015-11-04 ENCOUNTER — Ambulatory Visit (INDEPENDENT_AMBULATORY_CARE_PROVIDER_SITE_OTHER): Payer: Medicare Other

## 2015-11-04 DIAGNOSIS — Z7901 Long term (current) use of anticoagulants: Secondary | ICD-10-CM

## 2015-11-04 DIAGNOSIS — K661 Hemoperitoneum: Secondary | ICD-10-CM | POA: Diagnosis not present

## 2015-11-04 DIAGNOSIS — K55069 Acute infarction of intestine, part and extent unspecified: Secondary | ICD-10-CM | POA: Diagnosis not present

## 2015-11-04 DIAGNOSIS — I482 Chronic atrial fibrillation, unspecified: Secondary | ICD-10-CM

## 2015-11-04 DIAGNOSIS — I4891 Unspecified atrial fibrillation: Secondary | ICD-10-CM | POA: Diagnosis not present

## 2015-11-04 LAB — POCT INR: INR: 1.7

## 2015-11-18 DIAGNOSIS — H35342 Macular cyst, hole, or pseudohole, left eye: Secondary | ICD-10-CM | POA: Diagnosis not present

## 2015-11-18 DIAGNOSIS — H2513 Age-related nuclear cataract, bilateral: Secondary | ICD-10-CM | POA: Diagnosis not present

## 2015-11-18 DIAGNOSIS — H43813 Vitreous degeneration, bilateral: Secondary | ICD-10-CM | POA: Diagnosis not present

## 2015-11-18 DIAGNOSIS — H353133 Nonexudative age-related macular degeneration, bilateral, advanced atrophic without subfoveal involvement: Secondary | ICD-10-CM | POA: Diagnosis not present

## 2015-11-19 ENCOUNTER — Ambulatory Visit (INDEPENDENT_AMBULATORY_CARE_PROVIDER_SITE_OTHER): Payer: Medicare Other | Admitting: *Deleted

## 2015-11-19 DIAGNOSIS — I4891 Unspecified atrial fibrillation: Secondary | ICD-10-CM | POA: Diagnosis not present

## 2015-11-19 DIAGNOSIS — K661 Hemoperitoneum: Secondary | ICD-10-CM | POA: Diagnosis not present

## 2015-11-19 DIAGNOSIS — I482 Chronic atrial fibrillation, unspecified: Secondary | ICD-10-CM

## 2015-11-19 DIAGNOSIS — Z7901 Long term (current) use of anticoagulants: Secondary | ICD-10-CM | POA: Diagnosis not present

## 2015-11-19 DIAGNOSIS — K55069 Acute infarction of intestine, part and extent unspecified: Secondary | ICD-10-CM | POA: Diagnosis not present

## 2015-11-19 LAB — POCT INR: INR: 2.1

## 2015-12-09 ENCOUNTER — Ambulatory Visit (INDEPENDENT_AMBULATORY_CARE_PROVIDER_SITE_OTHER): Payer: Medicare Other | Admitting: *Deleted

## 2015-12-09 DIAGNOSIS — Z7901 Long term (current) use of anticoagulants: Secondary | ICD-10-CM

## 2015-12-09 DIAGNOSIS — K55069 Acute infarction of intestine, part and extent unspecified: Secondary | ICD-10-CM

## 2015-12-09 DIAGNOSIS — I4891 Unspecified atrial fibrillation: Secondary | ICD-10-CM

## 2015-12-09 DIAGNOSIS — K661 Hemoperitoneum: Secondary | ICD-10-CM | POA: Diagnosis not present

## 2015-12-09 DIAGNOSIS — I482 Chronic atrial fibrillation, unspecified: Secondary | ICD-10-CM

## 2015-12-09 LAB — POCT INR: INR: 1.9

## 2015-12-27 ENCOUNTER — Other Ambulatory Visit: Payer: Self-pay | Admitting: Family Medicine

## 2015-12-30 ENCOUNTER — Ambulatory Visit (INDEPENDENT_AMBULATORY_CARE_PROVIDER_SITE_OTHER): Payer: Medicare Other | Admitting: *Deleted

## 2015-12-30 DIAGNOSIS — I482 Chronic atrial fibrillation, unspecified: Secondary | ICD-10-CM

## 2015-12-30 DIAGNOSIS — K55069 Acute infarction of intestine, part and extent unspecified: Secondary | ICD-10-CM | POA: Diagnosis not present

## 2015-12-30 DIAGNOSIS — Z7901 Long term (current) use of anticoagulants: Secondary | ICD-10-CM | POA: Diagnosis not present

## 2015-12-30 DIAGNOSIS — I4891 Unspecified atrial fibrillation: Secondary | ICD-10-CM | POA: Diagnosis not present

## 2015-12-30 DIAGNOSIS — K661 Hemoperitoneum: Secondary | ICD-10-CM | POA: Diagnosis not present

## 2015-12-30 LAB — POCT INR: INR: 1.5

## 2016-01-09 ENCOUNTER — Ambulatory Visit (INDEPENDENT_AMBULATORY_CARE_PROVIDER_SITE_OTHER): Payer: Medicare Other | Admitting: *Deleted

## 2016-01-09 DIAGNOSIS — I4891 Unspecified atrial fibrillation: Secondary | ICD-10-CM

## 2016-01-09 DIAGNOSIS — K55069 Acute infarction of intestine, part and extent unspecified: Secondary | ICD-10-CM | POA: Diagnosis not present

## 2016-01-09 DIAGNOSIS — K661 Hemoperitoneum: Secondary | ICD-10-CM

## 2016-01-09 DIAGNOSIS — Z7901 Long term (current) use of anticoagulants: Secondary | ICD-10-CM

## 2016-01-09 DIAGNOSIS — I482 Chronic atrial fibrillation, unspecified: Secondary | ICD-10-CM

## 2016-01-09 LAB — POCT INR: INR: 2.1

## 2016-01-20 ENCOUNTER — Other Ambulatory Visit: Payer: Self-pay | Admitting: Cardiology

## 2016-01-28 ENCOUNTER — Ambulatory Visit (INDEPENDENT_AMBULATORY_CARE_PROVIDER_SITE_OTHER): Payer: Medicare Other | Admitting: *Deleted

## 2016-01-28 DIAGNOSIS — Z7901 Long term (current) use of anticoagulants: Secondary | ICD-10-CM | POA: Diagnosis not present

## 2016-01-28 DIAGNOSIS — K661 Hemoperitoneum: Secondary | ICD-10-CM | POA: Diagnosis not present

## 2016-01-28 DIAGNOSIS — K55069 Acute infarction of intestine, part and extent unspecified: Secondary | ICD-10-CM

## 2016-01-28 DIAGNOSIS — I4891 Unspecified atrial fibrillation: Secondary | ICD-10-CM | POA: Diagnosis not present

## 2016-01-28 DIAGNOSIS — I482 Chronic atrial fibrillation, unspecified: Secondary | ICD-10-CM

## 2016-01-28 LAB — POCT INR: INR: 2.5

## 2016-02-26 ENCOUNTER — Ambulatory Visit (INDEPENDENT_AMBULATORY_CARE_PROVIDER_SITE_OTHER): Payer: Medicare Other | Admitting: Pharmacist

## 2016-02-26 DIAGNOSIS — K55069 Acute infarction of intestine, part and extent unspecified: Secondary | ICD-10-CM | POA: Diagnosis not present

## 2016-02-26 DIAGNOSIS — Z7901 Long term (current) use of anticoagulants: Secondary | ICD-10-CM | POA: Diagnosis not present

## 2016-02-26 DIAGNOSIS — K661 Hemoperitoneum: Secondary | ICD-10-CM | POA: Diagnosis not present

## 2016-02-26 DIAGNOSIS — I4891 Unspecified atrial fibrillation: Secondary | ICD-10-CM

## 2016-02-26 DIAGNOSIS — I482 Chronic atrial fibrillation, unspecified: Secondary | ICD-10-CM

## 2016-02-26 LAB — POCT INR: INR: 2.5

## 2016-03-30 ENCOUNTER — Encounter: Payer: Self-pay | Admitting: Nurse Practitioner

## 2016-03-30 ENCOUNTER — Ambulatory Visit (INDEPENDENT_AMBULATORY_CARE_PROVIDER_SITE_OTHER): Payer: Medicare Other

## 2016-03-30 ENCOUNTER — Ambulatory Visit (INDEPENDENT_AMBULATORY_CARE_PROVIDER_SITE_OTHER): Payer: Medicare Other | Admitting: Nurse Practitioner

## 2016-03-30 VITALS — BP 112/70 | HR 82 | Ht 65.0 in | Wt 152.8 lb

## 2016-03-30 DIAGNOSIS — Z7901 Long term (current) use of anticoagulants: Secondary | ICD-10-CM

## 2016-03-30 DIAGNOSIS — I482 Chronic atrial fibrillation, unspecified: Secondary | ICD-10-CM

## 2016-03-30 DIAGNOSIS — K55069 Acute infarction of intestine, part and extent unspecified: Secondary | ICD-10-CM | POA: Diagnosis not present

## 2016-03-30 DIAGNOSIS — K661 Hemoperitoneum: Secondary | ICD-10-CM

## 2016-03-30 DIAGNOSIS — I4891 Unspecified atrial fibrillation: Secondary | ICD-10-CM

## 2016-03-30 LAB — POCT INR: INR: 2.4

## 2016-03-30 LAB — CBC
HCT: 42.8 % (ref 35.0–45.0)
Hemoglobin: 14.3 g/dL (ref 11.7–15.5)
MCH: 31.3 pg (ref 27.0–33.0)
MCHC: 33.4 g/dL (ref 32.0–36.0)
MCV: 93.7 fL (ref 80.0–100.0)
MPV: 9.3 fL (ref 7.5–12.5)
Platelets: 227 10*3/uL (ref 140–400)
RBC: 4.57 MIL/uL (ref 3.80–5.10)
RDW: 14.4 % (ref 11.0–15.0)
WBC: 6 10*3/uL (ref 3.8–10.8)

## 2016-03-30 NOTE — Patient Instructions (Addendum)
We will be checking the following labs today - BMET and CBC   Medication Instructions:    Continue with your current medicines.     Testing/Procedures To Be Arranged:  N/A  Follow-Up:   See me in one year.     Other Special Instructions:   N/A    If you need a refill on your cardiac medications before your next appointment, please call your pharmacy.   Call the [facility] office at [phone] if you have any questions, problems or concerns.      

## 2016-03-30 NOTE — Progress Notes (Signed)
CARDIOLOGY OFFICE NOTE  Date:  03/30/2016    Lisa Douglas Date of Birth: 1928-06-03 Medical Record [id number]  PCP:  Garret Reddish, MD  Cardiologist:  Aundra Dubin  Chief Complaint  Patient presents with  . Atrial Fibrillation    1 year check - seen for Dr. Aundra Dubin    History of Present Illness: Lisa Douglas is a 82 y.o. female who presents today for a one year check. Seen for Dr. Aundra Dubin.   She has a history of chronic atrial fibrillation and prior embolic SMA occlusion requiring embolectomy (off coumadin at the time). She has a history of GI bleeding from gastric AVMs.   I saw her a year ago - was felt to be doing ok from our standpoint.     Comes in today. Here alone. She is done ok.     PMH: 1. Hyperlipidemia 2. Gastric AVMs with history of GI bleeding. 3. GERD 4. Atrial fibrillation: Chronic. She had suspected cardioembolic SMA occlusion in 7/13 requiring SMA embolectomy. Coumadin begun at this time.  5. Macular degeneration. 6. Diverticulosis. 7. Echo (7/13) with EF 60%, mild MR.    Comes in today. Here alone. 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 She has had a good year. No falls this year. No chest pain. Breathing is ok. Tries to remain as active as she can. Not dizzy or lightheaded. Feels ok on her medicines. No bleeding noted.   Past Medical History:  Diagnosis Date  . 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 Not current problem.    . Angiodysplasia of intestine (without mention of hemorrhage)   . Anxiety state, unspecified   . Atrial fibrillation ([place])   . Diverticulosis of colon (without mention of hemorrhage)   . Diverticulosis of colon with hemorrhage 06/12/2014  . Esophageal reflux   . Macular degeneration (senile) of retina, unspecified   . 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 Osteoarthrosis, unspecified whether generalized or localized, unspecified site   . Osteoporosis,  unspecified   . Other and unspecified hyperlipidemia   . Unspecified venous (peripheral) insufficiency     Past Surgical History:  Procedure Laterality Date  . APPENDECTOMY  1951  . LAPAROTOMY  03/07/2012   Procedure: EXPLORATORY LAPAROTOMY;  Surgeon: Angelia Mould, MD;  Location: [facility] OR;  Service: Vascular;  Laterality: N/A;  Exploratory Laparotomy with Evacuation of Hematoma.  . TONSILLECTOMY  1943     Medications: Current Outpatient Prescriptions  Medication Sig Dispense Refill  . ALPRAZolam (XANAX) 0.5 MG tablet Take 0.5 mg by mouth 3 (three) times daily as needed for anxiety.    Marland Kitchen CARTIA XT 180 MG 24 hr capsule TAKE ONE CAPSULE BY MOUTH ONCE DAILY 30 capsule 10  . Cholecalciferol (VITAMIN D) 2000 UNITS CAPS Take 1 capsule by mouth daily.    . metoprolol succinate (TOPROL-XL) 25 MG 24 hr tablet TAKE ONE TABLET BY MOUTH ONCE DAILY 30 tablet 10  . Multiple Vitamin (MULTIVITAMIN WITH MINERALS) TABS Take 1 tablet by mouth daily.    . multivitamin-lutein (OCUVITE-LUTEIN) CAPS Take 1 capsule by mouth daily.    . simvastatin (ZOCOR) 20 MG tablet TAKE ONE TABLET BY MOUTH AT BEDTIME 30 tablet 5  . warfarin (COUMADIN) 3 MG tablet TAKE AS DIRECTED BY COUMADIN CLINIC. 20 tablet 3   No current facility-administered medications for this visit.     Allergies: Allergies  Allergen Reactions  . Penicillins Swelling  Angioedema Has patient had a PCN reaction causing immediate rash, facial/tongue/throat swelling, SOB or lightheadedness with hypotension: yes Has patient had a PCN reaction causing severe rash involving mucus membranes or skin necrosis: no Has patient had a PCN reaction that required hospitalization: no Has patient had a PCN reaction occurring within the last 10 years: no- about 70 yrs ago If all of the above answers are "NO", then may proceed with Cephalosporin use.   . Alendronate Sodium     REACTION: INTOL to Fosamax per pt.  . Atorvastatin     REACTION: INTOL to  Lipitor per pt.  . Morphine And Related     Makes her mean per pt    Social History: The patient  reports that she quit smoking about 64 years ago. Her smoking use included Cigarettes. She has a 0.50 pack-year smoking history. She has never used smokeless tobacco. She reports that she does not drink alcohol or use drugs.   Family History: The patient's family history includes Breast cancer in her sister; Heart disease in her sister; Hemochromatosis in her father; Lymphoma in her sister.   Review of Systems: Please see the history of present illness.   Otherwise, the review of systems is positive for none.   All other systems are reviewed and negative.   Physical Exam: VS:  BP 112/70   Pulse 82   Ht 5\' 5"  (1.651 m)   Wt 152 lb 12.8 oz (69.3 kg)   BMI 25.43 kg/m  .  BMI Body mass index is 25.43 kg/m.  Wt Readings from Last 3 Encounters:  03/30/16 152 lb 12.8 oz (69.3 kg)  10/28/15 154 lb (69.9 kg)  06/22/15 152 lb (68.9 kg)    General: Pleasant. Well developed, well nourished and in no acute distress.   HEENT: Normal.  Neck: Supple, no JVD, carotid bruits, or masses noted.  Cardiac: Irregular irregular rhythm. Her rate is ok. No edema.  Respiratory:  Lungs are clear to auscultation bilaterally with normal work of breathing.  GI: Soft and nontender.  MS: No deformity or atrophy. Gait and ROM intact.  Skin: Warm and dry. Color is normal.  Neuro:  Strength and sensation are intact and no gross focal deficits noted.  Psych: Alert, appropriate and with normal affect.   LABORATORY DATA:  EKG:  EKG is ordered today. This demonstrates chronic atrial fib with a controlled VR.  Lab Results  Component Value Date   WBC 7.1 03/26/2015   HGB 14.9 03/26/2015   HCT 44.4 03/26/2015   PLT 224.0 03/26/2015   GLUCOSE 84 03/26/2015   CHOL 141 04/29/2015   TRIG 112.0 04/29/2015   HDL 54.70 04/29/2015   LDLCALC 64 04/29/2015   ALT 16 04/29/2015   AST 22 04/29/2015   NA 138 03/26/2015    K 4.3 03/26/2015   CL 102 03/26/2015   CREATININE 0.74 03/26/2015   BUN 18 03/26/2015   CO2 29 03/26/2015   TSH 2.71 11/06/2013   INR 2.5 02/26/2016   Lab Results  Component Value Date   INR 2.5 02/26/2016   INR 2.5 01/28/2016   INR 2.1 01/09/2016     BNP (last 3 results) No results for input(s): BNP in the last 8760 hours.  ProBNP (last 3 results) No results for input(s): PROBNP in the last 8760 hours.   Other Studies Reviewed Today:  Echo Study Conclusions from 02/2012  - Left ventricle: The cavity size was normal. Wall thickness was normal. The estimated ejection fraction was  60%. Wall motion was normal; there were no regional wall motion abnormalities. - Mitral valve: Mild regurgitation. - Left atrium: The atrium was mildly dilated. - Right atrium: The atrium was mildly dilated. - Pulmonary arteries: PA peak pressure: 70mm Hg (S).  Assessment/Plan: 1. Atrial fibrillation: Chronic. Remains on Toprol XL and diltiazem CD. Given history of embolic SMA occlusion when off warfarin in the past, she will need Lovenox bridge if she needs to stop warfarin in the future. She is doing well on her current regimen.   2. History of gastric AVMs: No bleeding on coumadin so far. Would like to get her lab rechecked today.   3. Advanced age  Current medicines are reviewed with the patient today.  The patient does not have concerns regarding medicines other than what has been noted above.  The following changes have been made:  See above.  Labs/ tests ordered today include:    Orders Placed This Encounter  Procedures  . Basic metabolic panel  . CBC  . EKG 12-Lead     Disposition:   FU with me in one year.    Patient is agreeable to this plan and will call if any problems develop in the interim.   Signed: Burtis Junes, RN, ANP-C 03/30/2016 1:52 PM  [facility] [address] [place], [place]  [address] Phone:  [phone] Fax: [phone]

## 2016-03-31 LAB — BASIC METABOLIC PANEL
BUN: 14 mg/dL (ref 7–25)
CO2: 27 mmol/L (ref 20–31)
Calcium: 9.2 mg/dL (ref 8.6–10.4)
Chloride: 103 mmol/L (ref 98–110)
Creat: 0.76 mg/dL (ref 0.60–0.88)
Glucose, Bld: 83 mg/dL (ref 65–99)
Potassium: 3.9 mmol/L (ref 3.5–5.3)
Sodium: 140 mmol/L (ref 135–146)

## 2016-04-20 ENCOUNTER — Other Ambulatory Visit: Payer: Self-pay

## 2016-04-27 ENCOUNTER — Ambulatory Visit (INDEPENDENT_AMBULATORY_CARE_PROVIDER_SITE_OTHER): Payer: Medicare Other | Admitting: Family Medicine

## 2016-04-27 ENCOUNTER — Encounter: Payer: Self-pay | Admitting: Family Medicine

## 2016-04-27 VITALS — BP 150/72 | HR 76 | Temp 97.4°F | Ht 65.25 in | Wt 153.4 lb

## 2016-04-27 DIAGNOSIS — K55069 Acute infarction of intestine, part and extent unspecified: Secondary | ICD-10-CM | POA: Diagnosis not present

## 2016-04-27 DIAGNOSIS — Z0000 Encounter for general adult medical examination without abnormal findings: Secondary | ICD-10-CM

## 2016-04-27 DIAGNOSIS — I482 Chronic atrial fibrillation, unspecified: Secondary | ICD-10-CM

## 2016-04-27 DIAGNOSIS — E785 Hyperlipidemia, unspecified: Secondary | ICD-10-CM | POA: Diagnosis not present

## 2016-04-27 DIAGNOSIS — I10 Essential (primary) hypertension: Secondary | ICD-10-CM | POA: Diagnosis not present

## 2016-04-27 DIAGNOSIS — E559 Vitamin D deficiency, unspecified: Secondary | ICD-10-CM

## 2016-04-27 DIAGNOSIS — Z23 Encounter for immunization: Secondary | ICD-10-CM | POA: Diagnosis not present

## 2016-04-27 LAB — POC URINALSYSI DIPSTICK (AUTOMATED)
Bilirubin, UA: NEGATIVE
Blood, UA: NEGATIVE
Glucose, UA: NEGATIVE
KETONES UA: NEGATIVE
Nitrite, UA: NEGATIVE
PH UA: 5.5
SPEC GRAV UA: 1.02
Urobilinogen, UA: 0.2

## 2016-04-27 LAB — COMPREHENSIVE METABOLIC PANEL
ALK PHOS: 62 U/L (ref 39–117)
ALT: 16 U/L (ref 0–35)
AST: 22 U/L (ref 0–37)
Albumin: 4 g/dL (ref 3.5–5.2)
BUN: 17 mg/dL (ref 6–23)
CHLORIDE: 103 meq/L (ref 96–112)
CO2: 32 mEq/L (ref 19–32)
Calcium: 8.8 mg/dL (ref 8.4–10.5)
Creatinine, Ser: 0.76 mg/dL (ref 0.40–1.20)
GFR: 76.67 mL/min (ref >60.00)
GLUCOSE: 96 mg/dL (ref 70–99)
POTASSIUM: 4.5 meq/L (ref 3.5–5.1)
SODIUM: 141 meq/L (ref 135–145)
TOTAL PROTEIN: 6.5 g/dL (ref 6.0–8.3)
Total Bilirubin: 0.6 mg/dL (ref 0.2–1.2)

## 2016-04-27 LAB — LIPID PANEL
Cholesterol: 146 mg/dL (ref 0–200)
HDL: 53.5 mg/dL (ref >39.00)
LDL CALC: 65 mg/dL (ref 0–99)
NONHDL: 92.27
Total CHOL/HDL Ratio: 3
Triglycerides: 136 mg/dL (ref 0.0–149.0)
VLDL: 27.2 mg/dL (ref 0.0–40.0)

## 2016-04-27 LAB — TSH: TSH: 2.39 u[IU]/mL (ref 0.35–4.50)

## 2016-04-27 LAB — VITAMIN D 25 HYDROXY (VIT D DEFICIENCY, FRACTURES): VITD: 42.36 ng/mL (ref 30.00–100.00)

## 2016-04-27 LAB — CBC
HEMATOCRIT: 43.5 % (ref 36.0–46.0)
Hemoglobin: 14.6 g/dL (ref 12.0–15.0)
MCHC: 33.4 g/dL (ref 30.0–36.0)
MCV: 93.9 fl (ref 78.0–100.0)
Platelets: 232 10*3/uL (ref 150.0–400.0)
RBC: 4.64 Mil/uL (ref 3.87–5.11)
RDW: 14.2 % (ref 11.5–15.5)
WBC: 7.4 10*3/uL (ref 4.0–10.5)

## 2016-04-27 NOTE — Assessment & Plan Note (Signed)
S: 03/03/2012 epigastric abdominal pain led to surgery. Remains on coumadin for this along with a fib history A/P: continue coumadin. No abdominal pain

## 2016-04-27 NOTE — Assessment & Plan Note (Signed)
S: well controlled on last check. No myalgias.  Lab Results  Component Value Date   CHOL 141 04/29/2015   HDL 54.70 04/29/2015   LDLCALC 64 04/29/2015   TRIG 112.0 04/29/2015   CHOLHDL 3 04/29/2015   A/P: update lipids today- patient states for awv and labs that secondary will pick up what medicare doesn't cover. I cautioned her due to last awv and labs being 04/29/15.

## 2016-04-27 NOTE — Progress Notes (Signed)
Pre visit review using our clinic review tool, if applicable. No additional management support is needed unless otherwise documented below in the visit note. 

## 2016-04-27 NOTE — Assessment & Plan Note (Signed)
S: controlled usually on metoprolol and diltiazem but has not taken dose yet today BP Readings from Last 3 Encounters:  04/27/16 (!) 150/72  03/30/16 112/70  10/28/15 120/70  A/P:Continue current meds as previously controlled when on meds- she is to take meds as soon as she gets home, suspect will be controlled again

## 2016-04-27 NOTE — Progress Notes (Signed)
Phone: [phone]  Subjective:  Patient presents today for their annual wellness visit.    Preventive Screening-Counseling & Management  Smoking Status: former Smoker Second Hand Smoking status: No smokers in home  Risk Factors Regular exercise: difficulty in summer with heat, discussed walking plan. 10 minutes walking 1-3x a week when weather good currently Diet: some poor choices, working on overeating still. Weight stable  Fall Risk: None  Fall Risk  04/27/2016 10/29/2014 11/03/2012  Falls in the past year? No No No    Cardiac risk factors:  advanced age (older than 58 for men, 80 for women)  Hyperlipidemia - mild elevations, benefit of statin for primary prevention at her age unclear compared to risks but she is on simvastatin No diabetes.  Family History:sister with heart disease HTN- usually controlled on metoprolol and diltiazem- see below   Depression Screen None. PHQ2 0  Depression screen [facility] 2/9 04/27/2016 10/29/2014 11/06/2013 11/03/2012  Decreased Interest 0 0 0 0  Down, Depressed, Hopeless 0 0 0 0  PHQ - 2 Score 0 0 0 0    Activities of Daily Living Independent ADLs and IADLs   Hearing Difficulties: -patient endorses but refuses hearing aids  Cognitive Testing No reported trouble.   Normal 3 word recall  List the Names of Other Physician/Practitioners you currently use: -Dr. Aundra Dubin with cardiology -Optho at [place] History  Administered Date(s) Administered  . Influenza Split 05/26/2011, 06/02/2012  . Influenza Whole 05/15/2008, 05/21/2009, 05/27/2010  . Influenza,inj,Quad PF,36+ Mos 05/09/2013, 05/23/2014, 04/29/2015  . Pneumococcal Conjugate-13 10/29/2014  . Pneumococcal Polysaccharide-23 11/03/2012  . Td 03/31/2010  . Zoster 09/24/2011   Required Immunizations needed today: flu shot advised today , high dose flu shot given today  Screening tests- up to date  ROS- No pertinent positives discovered in course of AWV ROS-  No chest pain or shortness of breath. No headache or worsening vision.   The following were reviewed and entered/updated in epic: Past Medical History:  Diagnosis Date  . 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 Not current problem.    . Angiodysplasia of intestine (without mention of hemorrhage)   . Anxiety state, unspecified   . Atrial fibrillation ([place])   . Diverticulosis of colon (without mention of hemorrhage)   . Diverticulosis of colon with hemorrhage 06/12/2014  . Esophageal reflux   . Macular degeneration (senile) of retina, unspecified   . 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 Osteoarthrosis, unspecified whether generalized or localized, unspecified site   . Osteoporosis, unspecified   . Other and unspecified hyperlipidemia   . Unspecified venous (peripheral) insufficiency    Patient Active Problem List   Diagnosis Date Noted  . ATRIAL FIBRILLATION, CHRONIC 10/03/2007    Priority: High  . Essential hypertension 04/27/2016    Priority: Medium  . 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 Anxiety state 10/03/2007    Priority: Medium  . Osteoporosis 10/03/2007    Priority: Medium  . Hyperlipidemia 10/02/2007    Priority: Medium  . Angiodysplasia of colon 06/01/2004    Priority: Medium  . Epigastric hernia 12/23/2014    Priority: Low  . Diverticulitis of colon 06/12/2014    Priority: Low  . CKD (chronic kidney disease), stage II 03/26/2014    Priority: Low  . Encounter for long-term (current) use of anticoagulants 03/20/2012    Priority: Low  . Leg cramps 12/30/2011    Priority: Low  .  Vitamin D deficiency 04/01/2011    Priority: Low  . MACULAR DEGENERATION 10/03/2007    Priority: Low  . VENOUS INSUFFICIENCY 10/03/2007    Priority: Low  . GERD 10/03/2007    Priority: Low  . DEGENERATIVE JOINT DISEASE 10/03/2007    Priority: Low   Past Surgical History:  Procedure  Laterality Date  . APPENDECTOMY  1951  . LAPAROTOMY  03/07/2012   Procedure: EXPLORATORY LAPAROTOMY;  Surgeon: Angelia Mould, MD;  Location: [facility] OR;  Service: Vascular;  Laterality: N/A;  Exploratory Laparotomy with Evacuation of Hematoma.  . TONSILLECTOMY  1943    Family History  Problem Relation Age of Onset  . Heart disease Sister   . Breast cancer Sister     died 25, not sure when she had it  . Lymphoma Sister   . Hemochromatosis Father     tested in [place], denies history    Medications- reviewed and updated Current Outpatient Prescriptions  Medication Sig Dispense Refill  . ALPRAZolam (XANAX) 0.5 MG tablet Take 0.5 mg by mouth 3 (three) times daily as needed for anxiety.    Marland Kitchen CARTIA XT 180 MG 24 hr capsule TAKE ONE CAPSULE BY MOUTH ONCE DAILY 30 capsule 10  . Cholecalciferol (VITAMIN D) 2000 UNITS CAPS Take 1 capsule by mouth daily.    . metoprolol succinate (TOPROL-XL) 25 MG 24 hr tablet TAKE ONE TABLET BY MOUTH ONCE DAILY 30 tablet 10  . Multiple Vitamin (MULTIVITAMIN WITH MINERALS) TABS Take 1 tablet by mouth daily.    . multivitamin-lutein (OCUVITE-LUTEIN) CAPS Take 1 capsule by mouth daily.    . simvastatin (ZOCOR) 20 MG tablet TAKE ONE TABLET BY MOUTH AT BEDTIME 30 tablet 5  . warfarin (COUMADIN) 3 MG tablet TAKE AS DIRECTED BY COUMADIN CLINIC. 20 tablet 3   No current facility-administered medications for this visit.     Allergies-reviewed and updated Allergies  Allergen Reactions  . Penicillins Swelling    Angioedema Has patient had a PCN reaction causing immediate rash, facial/tongue/throat swelling, SOB or lightheadedness with hypotension: yes Has patient had a PCN reaction causing severe rash involving mucus membranes or skin necrosis: no Has patient had a PCN reaction that required hospitalization: no Has patient had a PCN reaction occurring within the last 10 years: no- about 70 yrs ago If all of the above answers are "NO", then may proceed with  Cephalosporin use.   . Alendronate Sodium     REACTION: INTOL to Fosamax per pt.  . Atorvastatin     REACTION: INTOL to Lipitor per pt.  . Morphine And Related     Makes her mean per pt    Social History   Social History  . Marital status: Single    Spouse name: N/A  . Number of children: N/A  . Years of education: N/A   Occupational History  . retired    Social History Main Topics  . Smoking status: Former Smoker    Packs/day: 0.25    Years: 2.00    Types: Cigarettes    Quit date: 08/24/1951  . Smokeless tobacco: Never Used     Comment: smoked at age 67 for a short time--a few months  . Alcohol use No  . Drug use: No  . Sexual activity: Not Asked   Other Topics Concern  . None   Social History Narrative   Born and raised in [place]. Lived on campus of [place] down.    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       Lives with college age student that she helped raise [name] graduating may 2017)   Never married. No kids. 1 sister living (out of 8), a lot of contact with nieces and nephes.       Hobbies: used to sew and knit (vision issues now), used to bowl   Now enjoys computer time-keeps up with family, not a big tv time    Objective: BP (!) 150/72 (BP Location: Left Arm, Patient Position: Sitting, Cuff Size: Normal)   Pulse 76   Temp 97.4 F (36.3 C) (Oral)   Ht 5' 5.25" (1.657 m)   Wt 153 lb 6.4 oz (69.6 kg)   SpO2 93%   BMI 25.33 kg/m  Gen: NAD, resting comfortably HEENT: Mucous membranes are moist. Oropharynx normal. Good dentition Neck: no thyromegaly CV: irregular irregular but regular rate, no murmurs rubs or gallops Lungs: CTAB no crackles, wheeze, rhonchi Abdomen: soft/nontender/nondistended/normal bowel sounds. No rebound or guarding. Epigastric hernia from prior surgery noted- easily  reducible Ext: no edema, some varicose veins and spider veins noted Skin: warm, dry Neuro: grossly normal, moves all extremities, PERRLA  Assessment/Plan:  AWV completed- discussed recommended screenings anddocumented any personalized health advice and referrals for preventive counseling. See AVS as well which was given to patient.   Status of chronic or acute concerns   ATRIAL FIBRILLATION, CHRONIC S: remains rate controlled. She takes metoprolol and diltiazem though she did not take dose yet this morning. Remains on coumadin through cards clinic A/P: continue current rate control and anticoagulation strategy.    Superior mesenteric artery thrombosis S: 03/03/2012 epigastric abdominal pain led to surgery. Remains on coumadin for this along with a fib history A/P: continue coumadin. No abdominal pain   Hyperlipidemia S: well controlled on last check. No myalgias.  Lab Results  Component Value Date   CHOL 141 04/29/2015   HDL 54.70 04/29/2015   LDLCALC 64 04/29/2015   TRIG 112.0 04/29/2015   CHOLHDL 3 04/29/2015   A/P: update lipids today- patient states for awv and labs that secondary will pick up what medicare doesn't cover. I cautioned her due to last awv and labs being 04/29/15.     Vitamin D deficiency S: 2000 IU vit D daily A/P: she is worried as trend seems to be going down- will update gain with labs today   Essential hypertension S: controlled usually on metoprolol and diltiazem but has not taken dose yet today BP Readings from Last 3 Encounters:  04/27/16 (!) 150/72  03/30/16 112/70  10/28/15 120/70  A/P:Continue current meds as previously controlled when on meds- she is to take meds as soon as she gets home, suspect will be controlled again    Return in about 6 months (around 10/25/2016).    Orders Placed This Encounter  Procedures  . CBC    [place]  . Comprehensive metabolic panel    [place]    Order Specific Question:   Has the patient fasted?     Answer:   No  . Lipid panel    [place]    Order Specific Question:   Has the patient fasted?    Answer:   No  . TSH    [place]  . VITAMIN D 25 Hydroxy (Vit-D Deficiency, Fractures)    [place]  . POCT Urinalysis  Dipstick (Automated)   High dose flu shot given  Return precautions advised.  Garret Reddish, MD

## 2016-04-27 NOTE — Patient Instructions (Addendum)
  Lisa Douglas , Thank you for taking time to come for your Medicare Wellness Visit. I appreciate your ongoing commitment to your health goals. Please review the following plan we discussed and let me know if I can assist you in the future.   These are the goals we discussed: 1. High dose flu shot 2. Labs before you leave   This is a list of the screening recommended for you and due dates:  Health Maintenance  Topic Date Due  . Flu Shot  03/23/2016  . Tetanus Vaccine  03/31/2020  . DEXA scan (bone density measurement)  Completed  . Shingles Vaccine  Completed  . Pneumonia vaccines  Completed

## 2016-04-27 NOTE — Assessment & Plan Note (Signed)
S: 2000 IU vit D daily A/P: she is worried as trend seems to be going down- will update gain with labs today

## 2016-04-27 NOTE — Assessment & Plan Note (Signed)
S: remains rate controlled. She takes metoprolol and diltiazem though she did not take dose yet this morning. Remains on coumadin through [facility] A/P: continue current rate control and anticoagulation strategy.

## 2016-04-28 ENCOUNTER — Ambulatory Visit (INDEPENDENT_AMBULATORY_CARE_PROVIDER_SITE_OTHER): Payer: Medicare Other | Admitting: Pharmacist

## 2016-04-28 ENCOUNTER — Other Ambulatory Visit: Payer: Self-pay | Admitting: Cardiology

## 2016-04-28 DIAGNOSIS — K661 Hemoperitoneum: Secondary | ICD-10-CM

## 2016-04-28 DIAGNOSIS — K55069 Acute infarction of intestine, part and extent unspecified: Secondary | ICD-10-CM | POA: Diagnosis not present

## 2016-04-28 DIAGNOSIS — Z7901 Long term (current) use of anticoagulants: Secondary | ICD-10-CM

## 2016-04-28 DIAGNOSIS — I4891 Unspecified atrial fibrillation: Secondary | ICD-10-CM | POA: Diagnosis not present

## 2016-04-28 LAB — POCT INR: INR: 2.5

## 2016-05-25 DIAGNOSIS — H35342 Macular cyst, hole, or pseudohole, left eye: Secondary | ICD-10-CM | POA: Diagnosis not present

## 2016-05-25 DIAGNOSIS — H43813 Vitreous degeneration, bilateral: Secondary | ICD-10-CM | POA: Insufficient documentation

## 2016-05-25 DIAGNOSIS — H353133 Nonexudative age-related macular degeneration, bilateral, advanced atrophic without subfoveal involvement: Secondary | ICD-10-CM | POA: Diagnosis not present

## 2016-05-25 DIAGNOSIS — H2513 Age-related nuclear cataract, bilateral: Secondary | ICD-10-CM | POA: Diagnosis not present

## 2016-05-27 ENCOUNTER — Ambulatory Visit (INDEPENDENT_AMBULATORY_CARE_PROVIDER_SITE_OTHER): Payer: Medicare Other | Admitting: *Deleted

## 2016-05-27 DIAGNOSIS — K55069 Acute infarction of intestine, part and extent unspecified: Secondary | ICD-10-CM | POA: Diagnosis not present

## 2016-05-27 DIAGNOSIS — Z7901 Long term (current) use of anticoagulants: Secondary | ICD-10-CM

## 2016-05-27 DIAGNOSIS — I4891 Unspecified atrial fibrillation: Secondary | ICD-10-CM | POA: Diagnosis not present

## 2016-05-27 DIAGNOSIS — K661 Hemoperitoneum: Secondary | ICD-10-CM

## 2016-05-27 LAB — POCT INR: INR: 1.8

## 2016-05-28 ENCOUNTER — Ambulatory Visit (INDEPENDENT_AMBULATORY_CARE_PROVIDER_SITE_OTHER): Payer: Medicare Other

## 2016-05-28 ENCOUNTER — Ambulatory Visit (INDEPENDENT_AMBULATORY_CARE_PROVIDER_SITE_OTHER): Payer: Medicare Other | Admitting: Physician Assistant

## 2016-05-28 VITALS — BP 122/68 | HR 125 | Temp 99.1°F | Resp 16 | Ht 65.0 in | Wt 150.0 lb

## 2016-05-28 DIAGNOSIS — R05 Cough: Secondary | ICD-10-CM | POA: Diagnosis not present

## 2016-05-28 DIAGNOSIS — H578 Other specified disorders of eye and adnexa: Secondary | ICD-10-CM

## 2016-05-28 DIAGNOSIS — J181 Lobar pneumonia, unspecified organism: Secondary | ICD-10-CM

## 2016-05-28 DIAGNOSIS — R059 Cough, unspecified: Secondary | ICD-10-CM

## 2016-05-28 DIAGNOSIS — H5789 Other specified disorders of eye and adnexa: Secondary | ICD-10-CM

## 2016-05-28 DIAGNOSIS — R509 Fever, unspecified: Secondary | ICD-10-CM

## 2016-05-28 DIAGNOSIS — J189 Pneumonia, unspecified organism: Secondary | ICD-10-CM

## 2016-05-28 LAB — POCT CBC
Granulocyte percent: 90.6 %G — AB (ref 37–80)
HCT, POC: 39.7 % (ref 37.7–47.9)
HEMOGLOBIN: 14 g/dL (ref 12.2–16.2)
LYMPH, POC: 1.1 (ref 0.6–3.4)
MCH: 32.2 pg — AB (ref 27–31.2)
MCHC: 35.3 g/dL (ref 31.8–35.4)
MCV: 91.3 fL (ref 80–97)
MID (cbc): 0.2 (ref 0–0.9)
MPV: 6.5 fL (ref 0–99.8)
PLATELET COUNT, POC: 193 10*3/uL (ref 142–424)
POC Granulocyte: 12.3 — AB (ref 2–6.9)
POC LYMPH PERCENT: 7.9 %L — AB (ref 10–50)
POC MID %: 1.5 %M (ref 0–12)
RBC: 4.35 M/uL (ref 4.04–5.48)
RDW, POC: 13.5 %
WBC: 13.6 10*3/uL — AB (ref 4.6–10.2)

## 2016-05-28 MED ORDER — BENZONATATE 100 MG PO CAPS: 100.0000 mg | ORAL_CAPSULE | Freq: Three times a day (TID) | ORAL | 0 refills | Status: DC | PRN

## 2016-05-28 MED ORDER — DOXYCYCLINE HYCLATE 100 MG PO CAPS: 100.0000 mg | ORAL_CAPSULE | Freq: Two times a day (BID) | ORAL | 0 refills | Status: AC

## 2016-05-28 NOTE — Progress Notes (Signed)
Subjective:    Patient ID: Lisa Douglas, female    DOB: 06-Jun-1928, 82 y.o.   MRN: [id number] Chief Complaint  Patient presents with  . Fever    x 1 day   . Cough    x 3 days   . Eye Problem    drainage, x 3 days     HPI Patient is a 82 yo female with a hx of Afib who presents today with eye drainage and flu-like symptoms x 3 days. The patient reports seeing an eye specialist at [place] on Tuesday for her macula degeneration, in which she needed her eyes dilated.  Wednesday she woke up with eye drainage and a "scratchy" throat. She reports a chronic cough that has progressively gotten worse these past 3 days. She reports increased frequency and effort of the cough with clear/white sputum. She also reports a pain on her left side that is exacerbated with coughing. Upon waking this morning she found it difficult to open her eyelids as they were "crusted together". She reports worsening eye drainage with purulent fluid.   Patient reports tolerating fluids but not any food as of last night. She also reports she has not taken her medications for her afib this morning due to loss of appetite."My prescription says to take with food and I was not hungry this morning". Niece of patient is at bedside helping to describe recent illness progression. Patient associates cough and flu-like symptoms with weakness, tremor, and fatigue. Denies chest pain, worsening SOB, difficulty breathing, or recent sick contacts.   Patient Active Problem List   Diagnosis Date Noted  . Posterior vitreous detachment, bilateral 05/25/2016  . Essential hypertension 04/27/2016  . Epigastric hernia 12/23/2014  . Nuclear cataract of both eyes 11/08/2014  . Diverticulitis of colon 06/12/2014  . CKD (chronic kidney disease), stage II 03/26/2014  . Encounter for long-term (current) use of anticoagulants 03/20/2012  . 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 Leg cramps 12/30/2011  . Macular hole 08/31/2011    . Vitamin D deficiency 04/01/2011  . Anxiety state 10/03/2007  . Advanced nonexudative age-related macular degeneration of both eyes without subfoveal involvement 10/03/2007  . ATRIAL FIBRILLATION, CHRONIC 10/03/2007  . VENOUS INSUFFICIENCY 10/03/2007  . GERD 10/03/2007  . DEGENERATIVE JOINT DISEASE 10/03/2007  . Osteoporosis 10/03/2007  . Hyperlipidemia 10/02/2007  . Angiodysplasia of colon 06/01/2004   Past Medical History:  Diagnosis Date  . 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 Not current problem.    . Angiodysplasia of intestine (without mention of hemorrhage)   . Anxiety state, unspecified   . Atrial fibrillation ([place])   . Diverticulosis of colon (without mention of hemorrhage)   . Diverticulosis of colon with hemorrhage 06/12/2014  . Esophageal reflux   . Macular degeneration (senile) of retina, unspecified   . 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 Osteoarthrosis, unspecified whether generalized or localized, unspecified site   . Osteoporosis, unspecified   . Other and unspecified hyperlipidemia   . Unspecified venous (peripheral) insufficiency    Family History  Problem Relation Age of Onset  . Hemochromatosis Father     tested in [place], denies history  . Heart disease Sister   . Breast cancer Sister     died 64, not sure when she had it  . Lymphoma Sister    Social History   Social History  . Marital status: Single    Spouse name: N/A  .  Number of children: N/A  . Years of education: N/A   Occupational History  . retired    Social History Main Topics  . Smoking status: Former Smoker    Packs/day: 0.25    Years: 2.00    Types: Cigarettes    Quit date: 08/24/1951  . Smokeless tobacco: Never Used     Comment: smoked at age 66 for a short time--a few months  . Alcohol use No  . Drug use: No  . Sexual activity: Not on file   Other Topics Concern  . Not on file   Social History Narrative   Born  and raised in [place]. Lived on [place] of [place].    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       Lives with college age student that she helped raise [name] graduating may 2017)   Never married. No kids. 1 sister living (out of 8), a lot of contact with nieces and nephes.       Hobbies: used to sew and knit (vision issues now), used to bowl   Now enjoys computer time-keeps up with family, not a big tv time   Current Outpatient Prescriptions on File Prior to Visit  Medication Sig Dispense Refill  . ALPRAZolam (XANAX) 0.5 MG tablet Take 0.5 mg by mouth 3 (three) times daily as needed for anxiety.    Marland Kitchen CARTIA XT 180 MG 24 hr capsule TAKE ONE CAPSULE BY MOUTH ONCE DAILY 30 capsule 11  . Cholecalciferol (VITAMIN D) 2000 UNITS CAPS Take 1 capsule by mouth daily.    . metoprolol succinate (TOPROL-XL) 25 MG 24 hr tablet TAKE ONE TABLET BY MOUTH ONCE DAILY 30 tablet 11  . Multiple Vitamin (MULTIVITAMIN WITH MINERALS) TABS Take 1 tablet by mouth daily.    . multivitamin-lutein (OCUVITE-LUTEIN) CAPS Take 1 capsule by mouth daily.    . simvastatin (ZOCOR) 20 MG tablet TAKE ONE TABLET BY MOUTH AT BEDTIME 30 tablet 5  . warfarin (COUMADIN) 3 MG tablet TAKE AS DIRECTED BY COUMADIN CLINIC. 20 tablet 3  . [DISCONTINUED] diltiazem (DILACOR XR) 180 MG 24 hr capsule Take 1 capsule (180 mg total) by mouth daily. 30 capsule 11   No current facility-administered medications on file prior to visit.     Review of Systems  Constitutional: Positive for activity change, appetite change, fatigue and fever. Negative for chills and diaphoresis.  HENT: Positive for congestion and sore throat. Negative for ear discharge, ear pain, postnasal drip, rhinorrhea, tinnitus and trouble swallowing.   Eyes: Negative for photophobia and visual disturbance.    Respiratory: Positive for cough (Chronic ) and shortness of breath (Hx of Afib). Negative for chest tightness.   Cardiovascular: Positive for chest pain (RIb pain). Negative for palpitations and leg swelling.  Gastrointestinal: Negative for diarrhea, nausea and vomiting.  Endocrine: Negative for polyuria.  Genitourinary: Negative for flank pain, frequency and urgency.  Musculoskeletal: Positive for myalgias (Places in chest where been coughing hard). Negative for arthralgias.  Neurological: Positive for tremors and weakness. Negative for dizziness and headaches.       Objective:   Physical Exam  Constitutional: She appears well-developed and well-nourished. She appears distressed (Tired laying on table with hand over her head).  HENT:  Head: Normocephalic and atraumatic.  Right Ear: External ear normal. A middle  ear effusion is present.  Left Ear: External ear normal. A middle ear effusion is present.  Mouth/Throat: Oropharynx is clear and moist. No oropharyngeal exudate.  Eyes: Pupils are equal, round, and reactive to light. Right eye exhibits discharge. Left eye exhibits discharge. Right conjunctiva is injected. No scleral icterus.  Neck: Normal range of motion. Neck supple. No JVD present. Carotid bruit is not present. No edema present. No thyromegaly present.    Cardiovascular: Intact distal pulses.  An irregularly irregular rhythm present.  No murmur heard. Pulmonary/Chest: Effort normal and breath sounds normal. No respiratory distress. She has no wheezes. She exhibits no tenderness.  Abdominal: Soft. She exhibits no distension. There is no tenderness.  Periumbilical hernia present   Musculoskeletal: She exhibits no edema or tenderness.  Neurological: She is alert.  Skin: Skin is warm and dry. No rash noted. She is not diaphoretic.  Psychiatric: She has a normal mood and affect. Her behavior is normal.   BP 122/68 (BP Location: Right Arm, Patient Position: Sitting, Cuff Size:  Normal)   Pulse (!) 125   Temp 99.1 F (37.3 C) (Oral)   Resp 16   Ht 5\' 5"  (1.651 m)   Wt 150 lb (68 kg)   SpO2 94%   BMI 24.96 kg/m   Dg Chest 2 View  Result Date: 05/28/2016 CLINICAL DATA:  Cough uri EXAM: CHEST  2 VIEW COMPARISON:  03/03/2012 FINDINGS: There is patchy airspace opacity in the posterior left upper lobe. There are prominent bronchovascular markings in the medial lung bases, but no other areas of airspace lung opacity. No pulmonary edema. No pleural effusion or pneumothorax. Cardiac silhouette is mildly enlarged. No mediastinal or hilar masses. Skeletal structures are demineralized but intact. IMPRESSION: 1. Left upper lobe pneumonia. Electronically Signed   By: Lajean Manes M.D.   On: 05/28/2016 13:08      Assessment & Plan:  1. Fever, unspecified fever cause Most likely due to an infection, will do POCT labs to check for infection in clinic due to age - POCT CBC - POCT Microscopic Urinalysis ([facility]) - POCT urinalysis dipstick  2. Cough Most likely due to pneumonia in upper left lung.  - DG Chest 2 View; Future - benzonatate (TESSALON) 100 MG capsule; Take 1-2 capsules (100-200 mg total) by mouth 3 (three) times daily as needed for cough.  Dispense: 40 capsule; Refill: 0  3. Eye drainage Most likely due to localized eye irritation  4. Community acquired pneumonia of left upper lobe of lung ([place]) Will do doxy instead of Azithromycin do to increased bleeding risk with coumadin  - doxycycline (VIBRAMYCIN) 100 MG capsule; Take 1 capsule (100 mg total) by mouth 2 (two) times daily.  Dispense: 20 capsule; Refill: 0

## 2016-05-28 NOTE — Progress Notes (Signed)
Patient ID: Lisa Douglas, female    DOB: [birth year], 82 y.o.   MRN: [id number]  PCP: Garret Reddish, MD  Subjective:   Chief Complaint  Patient presents with  . Fever    x 1 day   . Cough    x 3 days   . Eye Problem    drainage, x 3 days     HPI Presents for evaluation of fever, cough and eye irritation. She is accompanied by her niece's younger daughter.  She reports a chronic cough, but it became much worse about 3 days ago. She has pain in the LEFT lower chest with coughing. She's also had clear drainage from her eyes, and crusting on the lashes each morning x 3. Sometimes there is mucous in the eyes that she has to rinse out, and when she does, her eyes burn. Fever x 1 day, subjectively.  She describes nasal congestion and drainage, loss of appetite, fatigue, generalized malaise.  Denies urinary changes, diarrhea, nausea and vomiting. No SOB, HA, dizziness.  It is of note that she has not taken any medications since her dose of warfarin last night.   Review of Systems As above.    Patient Active Problem List   Diagnosis Date Noted  . Posterior vitreous detachment, bilateral 05/25/2016  . Essential hypertension 04/27/2016  . Epigastric hernia 12/23/2014  . Nuclear cataract of both eyes 11/08/2014  . Diverticulitis of colon 06/12/2014  . CKD (chronic kidney disease), stage II 03/26/2014  . Encounter for long-term (current) use of anticoagulants 03/20/2012  . 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 Leg cramps 12/30/2011  . Macular hole 08/31/2011  . Vitamin D deficiency 04/01/2011  . Anxiety state 10/03/2007  . Advanced nonexudative age-related macular degeneration of both eyes without subfoveal involvement 10/03/2007  . ATRIAL FIBRILLATION, CHRONIC 10/03/2007  . VENOUS INSUFFICIENCY 10/03/2007  . GERD 10/03/2007  . DEGENERATIVE JOINT DISEASE 10/03/2007  . Osteoporosis 10/03/2007  . Hyperlipidemia 10/02/2007  . Angiodysplasia of colon  06/01/2004     Prior to Admission medications   Medication Sig Start Date End Date Taking? Authorizing Provider  ALPRAZolam Duanne Moron) 0.5 MG tablet Take 0.5 mg by mouth 3 (three) times daily as needed for anxiety.   Yes Historical Provider, MD  CARTIA XT 180 MG 24 hr capsule TAKE ONE CAPSULE BY MOUTH ONCE DAILY 04/28/16  Yes Larey Dresser, MD  Cholecalciferol (VITAMIN D) 2000 UNITS CAPS Take 1 capsule by mouth daily.   Yes Historical Provider, MD  metoprolol succinate (TOPROL-XL) 25 MG 24 hr tablet TAKE ONE TABLET BY MOUTH ONCE DAILY 04/28/16  Yes Larey Dresser, MD  Multiple Vitamin (MULTIVITAMIN WITH MINERALS) TABS Take 1 tablet by mouth daily.   Yes Historical Provider, MD  multivitamin-lutein (OCUVITE-LUTEIN) CAPS Take 1 capsule by mouth daily.   Yes Historical Provider, MD  simvastatin (ZOCOR) 20 MG tablet TAKE ONE TABLET BY MOUTH AT BEDTIME 12/29/15  Yes Marin Olp, MD  warfarin (COUMADIN) 3 MG tablet TAKE AS DIRECTED BY COUMADIN CLINIC. 01/20/16  Yes Larey Dresser, MD     Allergies  Allergen Reactions  . Penicillins Swelling    Angioedema Has patient had a PCN reaction causing immediate rash, facial/tongue/throat swelling, SOB or lightheadedness with hypotension: yes Has patient had a PCN reaction causing severe rash involving mucus membranes or skin necrosis: no Has patient had a PCN reaction that required hospitalization: no Has patient had a PCN reaction occurring within the last 10 years: no- about  70 yrs ago If all of the above answers are "NO", then may proceed with Cephalosporin use.   . Alendronate Sodium     REACTION: INTOL to Fosamax per pt.  . Atorvastatin     REACTION: INTOL to Lipitor per pt.  . Morphine And Related     Makes her mean per pt       Objective:  Physical Exam  Constitutional: She is oriented to person, place, and time. She appears well-developed and well-nourished. She is active and cooperative. She has a sickly appearance. No distress.  BP  122/68 (BP Location: Right Arm, Patient Position: Sitting, Cuff Size: Normal)   Pulse (!) 125   Temp 99.1 F (37.3 C) (Oral)   Resp 16   Ht 5\' 5"  (1.651 m)   Wt 150 lb (68 kg)   SpO2 94%   BMI 24.96 kg/m   HENT:  Head: Normocephalic and atraumatic.  Right Ear: Hearing normal.  Left Ear: Hearing normal.  Mouth/Throat: Uvula is midline, oropharynx is clear and moist and mucous membranes are normal. No oral lesions. No uvula swelling.  halitosis  Eyes: EOM and lids are normal. Pupils are equal, round, and reactive to light. Right conjunctiva is injected (mild). Right conjunctiva has no hemorrhage. Left conjunctiva is not injected. Left conjunctiva has no hemorrhage. No scleral icterus.  Neck: Normal range of motion and full passive range of motion without pain. Neck supple. No thyromegaly present.  Cardiovascular: Normal rate and normal heart sounds.  An irregularly irregular rhythm present.  Pulses:      Radial pulses are 2+ on the right side, and 2+ on the left side.  Pulmonary/Chest: Effort normal and breath sounds normal.  Abdominal: Normal appearance and bowel sounds are normal. There is no tenderness.  Lymphadenopathy:       Head (right side): No tonsillar, no preauricular, no posterior auricular and no occipital adenopathy present.       Head (left side): No tonsillar, no preauricular, no posterior auricular and no occipital adenopathy present.    She has no cervical adenopathy.       Right: No supraclavicular adenopathy present.       Left: No supraclavicular adenopathy present.  Neurological: She is alert and oriented to person, place, and time. No sensory deficit.  Skin: Skin is warm, dry and intact. No rash noted. No cyanosis or erythema. Nails show no clubbing.  Psychiatric: She has a normal mood and affect. Her speech is normal and behavior is normal.       Results for orders placed or performed in visit on 05/28/16  POCT CBC  Result Value Ref Range   WBC 13.6 (A)  4.6 - 10.2 K/uL   Lymph, poc 1.1 0.6 - 3.4   POC LYMPH PERCENT 7.9 (A) 10 - 50 %L   MID (cbc) 0.2 0 - 0.9   POC MID % 1.5 0 - 12 %M   POC Granulocyte 12.3 (A) 2 - 6.9   Granulocyte percent 90.6 (A) 37 - 80 %G   RBC 4.35 4.04 - 5.48 M/uL   Hemoglobin 14.0 12.2 - 16.2 g/dL   HCT, POC 39.7 37.7 - 47.9 %   MCV 91.3 80 - 97 fL   MCH, POC 32.2 (A) 27 - 31.2 pg   MCHC 35.3 31.8 - 35.4 g/dL   RDW, POC 13.5 %   Platelet Count, POC 193 142 - 424 K/uL   MPV 6.5 0 - 99.8 fL    Dg Chest 2  View  Result Date: 05/28/2016 CLINICAL DATA:  Cough uri EXAM: CHEST  2 VIEW COMPARISON:  03/03/2012 FINDINGS: There is patchy airspace opacity in the posterior left upper lobe. There are prominent bronchovascular markings in the medial lung bases, but no other areas of airspace lung opacity. No pulmonary edema. No pleural effusion or pneumothorax. Cardiac silhouette is mildly enlarged. No mediastinal or hilar masses. Skeletal structures are demineralized but intact. IMPRESSION: 1. Left upper lobe pneumonia. Electronically Signed   By: Lajean Manes M.D.   On: 05/28/2016 13:08       Assessment & Plan:   1. Fever, unspecified fever cause Unable to provide a urine specimen. Due to #3. - POCT CBC - POCT Microscopic Urinalysis ([facility]) - POCT urinalysis dipstick  2. Cough Due to #3. - DG Chest 2 View; Future - benzonatate (TESSALON) 100 MG capsule; Take 1-2 capsules (100-200 mg total) by mouth 3 (three) times daily as needed for cough.  Dispense: 40 capsule; Refill: 0  3. Eye drainage Conjunctivitis. No purulent drainage. Supportive care.  4. Community acquired pneumonia of left upper lobe of lung (HCC) Doxycycline. Advise her coumadin clinic of this treatment, as it increases the bleeding risk. Recheck in 72 hours, repeat CXR in 4 weeks. To ED if symptoms worsen. - doxycycline (VIBRAMYCIN) 100 MG capsule; Take 1 capsule (100 mg total) by mouth 2 (two) times daily.  Dispense: 20 capsule; Refill: 0   Fara Chute, PA-C Physician Assistant-Certified [facility]

## 2016-05-28 NOTE — Patient Instructions (Addendum)
Get plenty of rest and drink at least 64 ounces of water daily.     IF you received an x-ray today, you will receive an invoice from [facility]. Please contact [facility] at [phone] with questions or concerns regarding your invoice.   IF you received labwork today, you will receive an invoice from [facility]. Please contact [facility] at [phone] with questions or concerns regarding your invoice.   Our billing staff will not be able to assist you with questions regarding bills from these companies.  You will be contacted with the lab results as soon as they are available. The fastest way to get your results is to activate your My Chart account. Instructions are located on the last page of this paperwork. If you have not heard from us regarding the results in 2 weeks, please contact this office.     

## 2016-05-31 ENCOUNTER — Ambulatory Visit (INDEPENDENT_AMBULATORY_CARE_PROVIDER_SITE_OTHER): Payer: Medicare Other | Admitting: Physician Assistant

## 2016-05-31 ENCOUNTER — Telehealth: Payer: Self-pay

## 2016-05-31 VITALS — BP 122/70 | HR 100 | Temp 98.1°F | Resp 18 | Ht 65.0 in | Wt 148.0 lb

## 2016-05-31 DIAGNOSIS — J189 Pneumonia, unspecified organism: Secondary | ICD-10-CM

## 2016-05-31 NOTE — Telephone Encounter (Signed)
Yes, I am aware. Due to her allergies, this was the best option.

## 2016-05-31 NOTE — Patient Instructions (Addendum)
As long as you continue to improve, follow-up here or with Dr. Yong Channel in 4 weeks for a repeat chest x-ray.     IF you received an x-ray today, you will receive an invoice from [facility]. Please contact [facility] at [phone] with questions or concerns regarding your invoice.   IF you received labwork today, you will receive an invoice from [facility]. Please contact [facility] at [phone] with questions or concerns regarding your invoice.   Our billing staff will not be able to assist you with questions regarding bills from these companies.  You will be contacted with the lab results as soon as they are available. The fastest way to get your results is to activate your My Chart account. Instructions are located on the last page of this paperwork. If you have not heard from [place] regarding the results in 2 weeks, please contact this office.

## 2016-05-31 NOTE — Progress Notes (Signed)
Patient ID: Lisa Douglas, female    DOB: [birth year], 82 y.o.   MRN: [id number]  PCP: Garret Reddish, MD  Subjective:   Chief Complaint  Patient presents with  . Follow-up  . Pneumonia    HPI Presents for evaluation of CAP.  She presented here on 10/06 with worsening chronic cough, and one day of fever. She also had significant upper respiratory symptoms, with rhinorrhea, conjunctivitis, and post-nasal drainage.  CBC revealed an elevated WBC with LEFT shift and CXR was notable for LUL infiltrate. She was started on doxycycline and benzonatate and presents today for recheck.  She reports feeling much better, though not yet well. Had one episode of productive cough with blood-tinged sputum, but otherwise cough is much better. LEFT lower chest remains painful with coughing, and she splints the area with her arm, but less painful than on the initial visit. Fatigue and lack of appetite persist. No SOB.    Review of Systems As above.    Patient Active Problem List   Diagnosis Date Noted  . Posterior vitreous detachment, bilateral 05/25/2016  . Essential hypertension 04/27/2016  . Epigastric hernia 12/23/2014  . Nuclear cataract of both eyes 11/08/2014  . Diverticulitis of colon 06/12/2014  . CKD (chronic kidney disease), stage II 03/26/2014  . Encounter for long-term (current) use of anticoagulants 03/20/2012  . 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) 03/03/2012  . Leg cramps 12/30/2011  . Macular hole 08/31/2011  . Vitamin D deficiency 04/01/2011  . Anxiety state 10/03/2007  . Advanced nonexudative age-related macular degeneration of both eyes without subfoveal involvement 10/03/2007  . ATRIAL FIBRILLATION, CHRONIC 10/03/2007  . VENOUS INSUFFICIENCY 10/03/2007  . GERD 10/03/2007  . DEGENERATIVE JOINT DISEASE 10/03/2007  . Osteoporosis 10/03/2007  . Hyperlipidemia 10/02/2007  . Angiodysplasia of colon 06/01/2004     Prior to Admission medications   Medication Sig  Start Date End Date Taking? Authorizing Provider  ALPRAZolam Duanne Moron) 0.5 MG tablet Take 0.5 mg by mouth 3 (three) times daily as needed for anxiety.   Yes Historical Provider, MD  benzonatate (TESSALON) 100 MG capsule Take 1-2 capsules (100-200 mg total) by mouth 3 (three) times daily as needed for cough. 05/28/16  Yes Leeanna Slaby, PA-C  CARTIA XT 180 MG 24 hr capsule TAKE ONE CAPSULE BY MOUTH ONCE DAILY 04/28/16  Yes Larey Dresser, MD  Cholecalciferol (VITAMIN D) 2000 UNITS CAPS Take 1 capsule by mouth daily.   Yes Historical Provider, MD  doxycycline (VIBRAMYCIN) 100 MG capsule Take 1 capsule (100 mg total) by mouth 2 (two) times daily. 05/28/16 06/07/16 Yes Jahmari Esbenshade, PA-C  metoprolol succinate (TOPROL-XL) 25 MG 24 hr tablet TAKE ONE TABLET BY MOUTH ONCE DAILY 04/28/16  Yes Larey Dresser, MD  Multiple Vitamin (MULTIVITAMIN WITH MINERALS) TABS Take 1 tablet by mouth daily.   Yes Historical Provider, MD  multivitamin-lutein (OCUVITE-LUTEIN) CAPS Take 1 capsule by mouth daily.   Yes Historical Provider, MD  simvastatin (ZOCOR) 20 MG tablet TAKE ONE TABLET BY MOUTH AT BEDTIME 12/29/15  Yes Marin Olp, MD  warfarin (COUMADIN) 3 MG tablet TAKE AS DIRECTED BY COUMADIN CLINIC. 01/20/16  Yes Larey Dresser, MD     Allergies  Allergen Reactions  . Penicillins Swelling    Angioedema Has patient had a PCN reaction causing immediate rash, facial/tongue/throat swelling, SOB or lightheadedness with hypotension: yes Has patient had a PCN reaction causing severe rash involving mucus membranes or skin necrosis: no Has patient had a PCN reaction that required  hospitalization: no Has patient had a PCN reaction occurring within the last 10 years: no- about 70 yrs ago If all of the above answers are "NO", then may proceed with Cephalosporin use.   . Alendronate Sodium     REACTION: INTOL to Fosamax per pt.  . Atorvastatin     REACTION: INTOL to Lipitor per pt.  . Morphine And Related     Makes  her mean per pt       Objective:  Physical Exam  Constitutional: She is oriented to person, place, and time. She appears well-developed and well-nourished. She is active and cooperative. No distress.  BP 122/70 (BP Location: Right Arm, Patient Position: Sitting, Cuff Size: Small)   Pulse 100   Temp 98.1 F (36.7 C) (Oral)   Resp 18   Ht 5\' 5"  (1.651 m)   Wt 148 lb (67.1 kg)   SpO2 94%   BMI 24.63 kg/m   HENT:  Head: Normocephalic and atraumatic.  Right Ear: Hearing, tympanic membrane, external ear and ear canal normal.  Left Ear: Hearing, tympanic membrane, external ear and ear canal normal.  Nose: Nose normal.  Mouth/Throat: Uvula is midline and oropharynx is clear and moist. No oral lesions. No uvula swelling.  Eyes: Conjunctivae are normal. No scleral icterus.  Neck: Normal range of motion. Neck supple. No thyromegaly present.  Cardiovascular: Normal rate, regular rhythm and normal heart sounds.   Pulses:      Radial pulses are 2+ on the right side, and 2+ on the left side.  Pulmonary/Chest: Effort normal and breath sounds normal.  Lymphadenopathy:       Head (right side): No tonsillar, no preauricular, no posterior auricular and no occipital adenopathy present.       Head (left side): No tonsillar, no preauricular, no posterior auricular and no occipital adenopathy present.    She has no cervical adenopathy.       Right: No supraclavicular adenopathy present.       Left: No supraclavicular adenopathy present.  Neurological: She is alert and oriented to person, place, and time. No sensory deficit.  Skin: Skin is warm, dry and intact. No rash noted. No cyanosis or erythema. Nails show no clubbing.  Psychiatric: She has a normal mood and affect. Her speech is normal and behavior is normal.           Assessment & Plan:   1. Community acquired pneumonia of left lung, unspecified part of lung Improving. Complete doxycycline. Continue supportive care. Follow-up here or  with PCP for repeat CXR in 4 weeks, sooner if improvement stalls or symptoms worsen.   Fara Chute, PA-C Physician Assistant-Certified [facility]

## 2016-05-31 NOTE — Progress Notes (Signed)
Subjective:    Patient ID: Lisa Douglas, female    DOB: Aug 12, 1928, 82 y.o.   MRN: [id number] Chief Complaint  Patient presents with  . Follow-up  . Pneumonia    HPI Patient is a 82 yo female who presents today for follow-up of pneumonia. Patient reports she is "feeling more alive than last week". She was treated on 05/28/2016 for CAP with Doxy. Patients reports decrease in frequency and severity of her cough; however still having some pain on the left chest wall. The pain is exacerbated with cough but is markedly less sever than last week. Reports low grade fever 99.8 on Saturday night then breaks the fever after taking tylenol. Patient also reports hemoptysis on Saturday night, light red streak in sputum. She reports she may have had it before but does not remember.     Patient Active Problem List   Diagnosis Date Noted  . Posterior vitreous detachment, bilateral 05/25/2016  . Essential hypertension 04/27/2016  . Epigastric hernia 12/23/2014  . Nuclear cataract of both eyes 11/08/2014  . Diverticulitis of colon 06/12/2014  . CKD (chronic kidney disease), stage II 03/26/2014  . Encounter for long-term (current) use of anticoagulants 03/20/2012  . 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 Leg cramps 12/30/2011  . Macular hole 08/31/2011  . Vitamin D deficiency 04/01/2011  . Anxiety state 10/03/2007  . Advanced nonexudative age-related macular degeneration of both eyes without subfoveal involvement 10/03/2007  . ATRIAL FIBRILLATION, CHRONIC 10/03/2007  . VENOUS INSUFFICIENCY 10/03/2007  . GERD 10/03/2007  . DEGENERATIVE JOINT DISEASE 10/03/2007  . Osteoporosis 10/03/2007  . Hyperlipidemia 10/02/2007  . Angiodysplasia of colon 06/01/2004   Past Medical History:  Diagnosis Date  . 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 Not current problem.    . Angiodysplasia of intestine (without mention of  hemorrhage)   . Anxiety state, unspecified   . Atrial fibrillation ([place])   . Diverticulosis of colon (without mention of hemorrhage)   . Diverticulosis of colon with hemorrhage 06/12/2014  . Esophageal reflux   . Macular degeneration (senile) of retina, unspecified   . 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 Osteoarthrosis, unspecified whether generalized or localized, unspecified site   . Osteoporosis, unspecified   . Other and unspecified hyperlipidemia   . Unspecified venous (peripheral) insufficiency    Family History  Problem Relation Age of Onset  . Hemochromatosis Father     tested in [place], denies history  . Heart disease Sister   . Breast cancer Sister     died 72, not sure when she had it  . Lymphoma Sister    Current Outpatient Prescriptions on File Prior to Visit  Medication Sig Dispense Refill  . ALPRAZolam (XANAX) 0.5 MG tablet Take 0.5 mg by mouth 3 (three) times daily as needed for anxiety.    . benzonatate (TESSALON) 100 MG capsule Take 1-2 capsules (100-200 mg total) by mouth 3 (three) times daily as needed for cough. 40 capsule 0  . CARTIA XT 180 MG 24 hr capsule TAKE ONE CAPSULE BY MOUTH ONCE DAILY 30 capsule 11  . Cholecalciferol (VITAMIN D) 2000 UNITS CAPS Take 1 capsule by mouth daily.    Marland Kitchen doxycycline (VIBRAMYCIN) 100 MG capsule Take 1 capsule (100 mg total) by mouth 2 (two) times daily. 20 capsule 0  . metoprolol succinate (TOPROL-XL) 25 MG 24 hr tablet TAKE ONE TABLET BY MOUTH ONCE DAILY 30 tablet 11  . Multiple  Vitamin (MULTIVITAMIN WITH MINERALS) TABS Take 1 tablet by mouth daily.    . multivitamin-lutein (OCUVITE-LUTEIN) CAPS Take 1 capsule by mouth daily.    . simvastatin (ZOCOR) 20 MG tablet TAKE ONE TABLET BY MOUTH AT BEDTIME 30 tablet 5  . warfarin (COUMADIN) 3 MG tablet TAKE AS DIRECTED BY COUMADIN CLINIC. 20 tablet 3  . [DISCONTINUED] diltiazem (DILACOR XR) 180 MG 24 hr capsule Take 1 capsule (180 mg total) by mouth daily. 30 capsule 11   No current  facility-administered medications on file prior to visit.      Review of Systems  Constitutional: Positive for diaphoresis and fever. Negative for chills, fatigue and unexpected weight change.  HENT: Positive for postnasal drip. Negative for ear pain, rhinorrhea, sore throat and trouble swallowing.   Respiratory: Positive for cough. Negative for chest tightness and shortness of breath.   Cardiovascular: Positive for chest pain (MSK by nature ). Negative for palpitations and leg swelling.  Gastrointestinal: Negative for diarrhea, nausea and vomiting.  Genitourinary: Negative for decreased urine volume, dysuria, frequency and urgency.  Musculoskeletal: Positive for myalgias (Left side rib area ). Negative for neck pain and neck stiffness.  Skin: Negative for rash.  Neurological: Negative for dizziness, weakness, light-headedness, numbness and headaches.  Psychiatric/Behavioral: Negative for agitation and behavioral problems.       Objective:   Physical Exam  Constitutional: She appears well-developed and well-nourished. No distress.  HENT:  Head: Normocephalic and atraumatic.  Right Ear: External ear normal. A middle ear effusion is present.  Left Ear: External ear normal.  No middle ear effusion.  Mouth/Throat: No oropharyngeal exudate.  Eyes: Conjunctivae are normal. Pupils are equal, round, and reactive to light. Right eye exhibits no discharge. Left eye exhibits no discharge.  Neck: Neck supple. No thyromegaly present.  Cardiovascular: Normal rate, regular rhythm, normal heart sounds and intact distal pulses.  Exam reveals no gallop and no friction rub.   No murmur heard. Pulmonary/Chest: Effort normal and breath sounds normal. No respiratory distress. She has no wheezes.  Abdominal: Soft. Bowel sounds are normal. She exhibits no distension. There is no tenderness.  Musculoskeletal: She exhibits no edema or tenderness.  Lymphadenopathy:    She has no cervical adenopathy.    Neurological: She is alert. She displays normal reflexes.  Skin: Skin is warm and dry. No rash noted. She is not diaphoretic.  Psychiatric: She has a normal mood and affect. Her behavior is normal.   BP 122/70 (BP Location: Right Arm, Patient Position: Sitting, Cuff Size: Small)   Pulse 100   Temp 98.1 F (36.7 C) (Oral)   Resp 18   Ht 5\' 5"  (1.651 m)   Wt 148 lb (67.1 kg)   SpO2 94%   BMI 24.63 kg/m       Assessment & Plan:  1. Community acquired pneumonia of left lung, unspecified part of lung Patient feeling better, continue course of ABX and tesselon pearls   2. Hemoptysis Most like due to trauma from cough. Observation at this point

## 2016-05-31 NOTE — Telephone Encounter (Signed)
Pharm faxed notice that vibramycin can affect coumadin level. Wanted to know if Chelle had figured that in already or if she wants to switch?

## 2016-06-02 NOTE — Telephone Encounter (Signed)
Pharmacy advised  

## 2016-06-10 ENCOUNTER — Ambulatory Visit (INDEPENDENT_AMBULATORY_CARE_PROVIDER_SITE_OTHER): Payer: Medicare Other | Admitting: *Deleted

## 2016-06-10 DIAGNOSIS — I4891 Unspecified atrial fibrillation: Secondary | ICD-10-CM

## 2016-06-10 DIAGNOSIS — Z7901 Long term (current) use of anticoagulants: Secondary | ICD-10-CM | POA: Diagnosis not present

## 2016-06-10 DIAGNOSIS — K661 Hemoperitoneum: Secondary | ICD-10-CM | POA: Diagnosis not present

## 2016-06-10 DIAGNOSIS — K683 Retroperitoneal hematoma: Secondary | ICD-10-CM

## 2016-06-10 DIAGNOSIS — K55069 Acute infarction of intestine, part and extent unspecified: Secondary | ICD-10-CM | POA: Diagnosis not present

## 2016-06-10 LAB — POCT INR: INR: 4.5

## 2016-06-10 MED ORDER — WARFARIN SODIUM 3 MG PO TABS: ORAL_TABLET | ORAL | 3 refills | Status: DC

## 2016-06-18 ENCOUNTER — Ambulatory Visit (INDEPENDENT_AMBULATORY_CARE_PROVIDER_SITE_OTHER): Payer: Medicare Other | Admitting: *Deleted

## 2016-06-18 DIAGNOSIS — K55069 Acute infarction of intestine, part and extent unspecified: Secondary | ICD-10-CM | POA: Diagnosis not present

## 2016-06-18 DIAGNOSIS — I4891 Unspecified atrial fibrillation: Secondary | ICD-10-CM

## 2016-06-18 DIAGNOSIS — K661 Hemoperitoneum: Secondary | ICD-10-CM | POA: Diagnosis not present

## 2016-06-18 DIAGNOSIS — Z7901 Long term (current) use of anticoagulants: Secondary | ICD-10-CM

## 2016-06-18 LAB — POCT INR: INR: 2.5

## 2016-06-28 ENCOUNTER — Ambulatory Visit (INDEPENDENT_AMBULATORY_CARE_PROVIDER_SITE_OTHER): Payer: Medicare Other | Admitting: Family Medicine

## 2016-06-28 ENCOUNTER — Encounter: Payer: Self-pay | Admitting: Family Medicine

## 2016-06-28 ENCOUNTER — Ambulatory Visit (INDEPENDENT_AMBULATORY_CARE_PROVIDER_SITE_OTHER)
Admission: RE | Admit: 2016-06-28 | Discharge: 2016-06-28 | Disposition: A | Discharge status: Home / Self Care | Payer: Medicare Other | Source: Ambulatory Visit | Attending: Family Medicine | Admitting: Family Medicine

## 2016-06-28 VITALS — BP 124/78 | HR 90 | Temp 97.6°F | Wt 147.6 lb

## 2016-06-28 DIAGNOSIS — J189 Pneumonia, unspecified organism: Secondary | ICD-10-CM | POA: Diagnosis not present

## 2016-06-28 DIAGNOSIS — D72829 Elevated white blood cell count, unspecified: Secondary | ICD-10-CM | POA: Diagnosis not present

## 2016-06-28 DIAGNOSIS — J181 Lobar pneumonia, unspecified organism: Secondary | ICD-10-CM

## 2016-06-28 LAB — CBC WITH DIFFERENTIAL/PLATELET
BASOS PCT: 0.4 % (ref 0.0–3.0)
Basophils Absolute: 0 10*3/uL (ref 0.0–0.1)
EOS PCT: 2.4 % (ref 0.0–5.0)
Eosinophils Absolute: 0.2 10*3/uL (ref 0.0–0.7)
HEMATOCRIT: 44.7 % (ref 36.0–46.0)
HEMOGLOBIN: 15 g/dL (ref 12.0–15.0)
LYMPHS PCT: 25.5 % (ref 12.0–46.0)
Lymphs Abs: 2 10*3/uL (ref 0.7–4.0)
MCHC: 33.6 g/dL (ref 30.0–36.0)
MCV: 93 fl (ref 78.0–100.0)
MONO ABS: 0.8 10*3/uL (ref 0.1–1.0)
Monocytes Relative: 9.8 % (ref 3.0–12.0)
Neutro Abs: 5 10*3/uL (ref 1.4–7.7)
Neutrophils Relative %: 61.9 % (ref 43.0–77.0)
Platelets: 206 10*3/uL (ref 150.0–400.0)
RBC: 4.8 Mil/uL (ref 3.87–5.11)
RDW: 15.2 % (ref 11.5–15.5)
WBC: 8 10*3/uL (ref 4.0–10.5)

## 2016-06-28 NOTE — Patient Instructions (Signed)
Lab before you go to recheck white count  Get x-ray of chest at [facility] Please go to [facility] - located [address] from [place] - in the basement - Hours: 8:30-5:30 PM M-F. Do not need appointment.   Clinically you look much better! I suspect labs and x-ray will show the same

## 2016-06-28 NOTE — Progress Notes (Signed)
Subjective:  Lisa Douglas is a 82 y.o. year old very pleasant female patient who presents for/with See problem oriented charting ROS- some mild fatigue more than normal. SOB at baseline- sometime with stairs. No chest pain.see any ROS included in HPI as well.   Past Medical History-  Patient Active Problem List   Diagnosis Date Noted  . ATRIAL FIBRILLATION, CHRONIC 10/03/2007    Priority: High  . Essential hypertension 04/27/2016    Priority: Medium  . 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 Anxiety state 10/03/2007    Priority: Medium  . Osteoporosis 10/03/2007    Priority: Medium  . Hyperlipidemia 10/02/2007    Priority: Medium  . Angiodysplasia of colon 06/01/2004    Priority: Medium  . Epigastric hernia 12/23/2014    Priority: Low  . Diverticulitis of colon 06/12/2014    Priority: Low  . CKD (chronic kidney disease), stage II 03/26/2014    Priority: Low  . Encounter for long-term (current) use of anticoagulants 03/20/2012    Priority: Low  . Leg cramps 12/30/2011    Priority: Low  . Vitamin D deficiency 04/01/2011    Priority: Low  . Advanced nonexudative age-related macular degeneration of both eyes without subfoveal involvement 10/03/2007    Priority: Low  . VENOUS INSUFFICIENCY 10/03/2007    Priority: Low  . GERD 10/03/2007    Priority: Low  . DEGENERATIVE JOINT DISEASE 10/03/2007    Priority: Low  . Posterior vitreous detachment, bilateral 05/25/2016  . Nuclear cataract of both eyes 11/08/2014  . Macular hole 08/31/2011    Medications- reviewed and updated Current Outpatient Prescriptions  Medication Sig Dispense Refill  . ALPRAZolam (XANAX) 0.5 MG tablet Take 0.5 mg by mouth 3 (three) times daily as needed for anxiety.    Marland Kitchen CARTIA XT 180 MG 24 hr capsule TAKE ONE CAPSULE BY MOUTH ONCE DAILY 30 capsule 11  . Cholecalciferol (VITAMIN D) 2000 UNITS CAPS Take 1 capsule by mouth daily.    . metoprolol succinate (TOPROL-XL)  25 MG 24 hr tablet TAKE ONE TABLET BY MOUTH ONCE DAILY 30 tablet 11  . Multiple Vitamin (MULTIVITAMIN WITH MINERALS) TABS Take 1 tablet by mouth daily.    . multivitamin-lutein (OCUVITE-LUTEIN) CAPS Take 1 capsule by mouth daily.    . simvastatin (ZOCOR) 20 MG tablet TAKE ONE TABLET BY MOUTH AT BEDTIME 30 tablet 5  . warfarin (COUMADIN) 3 MG tablet TAKE AS DIRECTED BY COUMADIN CLINIC. 20 tablet 3   No current facility-administered medications for this visit.     Objective: BP 124/78 (BP Location: Left Arm, Patient Position: Sitting, Cuff Size: Normal)   Pulse 90   Temp 97.6 F (36.4 C) (Oral)   Wt 147 lb 9.6 oz (67 kg)   SpO2 94%   BMI 24.56 kg/m  Gen: NAD, resting comfortably, well appearing Mucous membranes are moist. CV: RRR no murmurs rubs or gallops Lungs: CTAB no crackles, wheeze, rhonchi  Ext: no edema Skin: warm, dry, no rash, good skin turgor  Assessment/Plan:  Community acquired pneumonia of left upper lobe of lung ([place]) - Plan: DG Chest 2 View  Leukocytosis, unspecified type - Plan: CBC with Differential/Platelet S/HPI: Patient seen at [facility] 1 month ago and was found to have CAP. Placed on doxycycline instead of azithromycin with concern of bleeding risk with coumadin. She also was found to have elevated WBC count about 13k. She states she felt very ill- very tired, short of breath, had  fever. All of this has resoled except for some mild Shortness of breath. Was instructed to follow up in 1 month for CXR to document clearance A/P:  We will update CXR though clinically is very well appearing. Discussed not updating WBC given pneumonia as likely precipitant- she would like to have this updated and labs ordered today. Follow up as needed based on symptoms or findings on workup.    Orders Placed This Encounter  Procedures  . DG Chest 2 View    Standing Status:   Future    Standing Expiration Date:   08/28/2017    Order Specific Question:   Reason for Exam  (SYMPTOM  OR DIAGNOSIS REQUIRED)    Answer:   CAP follow up for clearance    Order Specific Question:   Preferred imaging location?    Answer:   [facility]  . CBC with Differential/Platelet   Return precautions advised.  Garret Reddish, MD

## 2016-06-28 NOTE — Progress Notes (Signed)
Pre visit review using our clinic review tool, if applicable. No additional management support is needed unless otherwise documented below in the visit note. 

## 2016-06-30 ENCOUNTER — Emergency Department (HOSPITAL_COMMUNITY)
Admission: EM | Admit: 2016-06-30 | Discharge: 2016-06-30 | Disposition: A | Discharge status: Home / Self Care | Payer: Medicare Other | Attending: Emergency Medicine | Admitting: Emergency Medicine

## 2016-06-30 ENCOUNTER — Encounter (HOSPITAL_COMMUNITY): Payer: Self-pay | Admitting: Emergency Medicine

## 2016-06-30 ENCOUNTER — Emergency Department (HOSPITAL_COMMUNITY): Payer: Medicare Other

## 2016-06-30 DIAGNOSIS — Z87891 Personal history of nicotine dependence: Secondary | ICD-10-CM | POA: Insufficient documentation

## 2016-06-30 DIAGNOSIS — Y999 Unspecified external cause status: Secondary | ICD-10-CM | POA: Insufficient documentation

## 2016-06-30 DIAGNOSIS — W010XXA Fall on same level from slipping, tripping and stumbling without subsequent striking against object, initial encounter: Secondary | ICD-10-CM | POA: Insufficient documentation

## 2016-06-30 DIAGNOSIS — Z7901 Long term (current) use of anticoagulants: Secondary | ICD-10-CM | POA: Diagnosis not present

## 2016-06-30 DIAGNOSIS — M79642 Pain in left hand: Secondary | ICD-10-CM | POA: Diagnosis not present

## 2016-06-30 DIAGNOSIS — Y929 Unspecified place or not applicable: Secondary | ICD-10-CM | POA: Insufficient documentation

## 2016-06-30 DIAGNOSIS — S52502A Unspecified fracture of the lower end of left radius, initial encounter for closed fracture: Secondary | ICD-10-CM

## 2016-06-30 DIAGNOSIS — S52612A Displaced fracture of left ulna styloid process, initial encounter for closed fracture: Secondary | ICD-10-CM | POA: Diagnosis not present

## 2016-06-30 DIAGNOSIS — Y939 Activity, unspecified: Secondary | ICD-10-CM | POA: Diagnosis not present

## 2016-06-30 DIAGNOSIS — N182 Chronic kidney disease, stage 2 (mild): Secondary | ICD-10-CM | POA: Insufficient documentation

## 2016-06-30 DIAGNOSIS — Z79899 Other long term (current) drug therapy: Secondary | ICD-10-CM | POA: Diagnosis not present

## 2016-06-30 DIAGNOSIS — I129 Hypertensive chronic kidney disease with stage 1 through stage 4 chronic kidney disease, or unspecified chronic kidney disease: Secondary | ICD-10-CM | POA: Insufficient documentation

## 2016-06-30 DIAGNOSIS — S6992XA Unspecified injury of left wrist, hand and finger(s), initial encounter: Secondary | ICD-10-CM | POA: Diagnosis not present

## 2016-06-30 DIAGNOSIS — M25532 Pain in left wrist: Secondary | ICD-10-CM | POA: Diagnosis not present

## 2016-06-30 DIAGNOSIS — W19XXXA Unspecified fall, initial encounter: Secondary | ICD-10-CM

## 2016-06-30 DIAGNOSIS — S52302A Unspecified fracture of shaft of left radius, initial encounter for closed fracture: Secondary | ICD-10-CM | POA: Insufficient documentation

## 2016-06-30 NOTE — ED Provider Notes (Signed)
[facility] DEPT Provider Note   CSN: [id number] Arrival date & time: 06/30/16  1145     History   Chief Complaint Chief Complaint  Patient presents with  . Fall  . Hand Injury    HPI Lisa Douglas is a 82 y.o. female who presents with a mechanical fall and L wrist pain. She states that she "slipped on some wet leaves" and fell forward in to a patch of grass to brace her fall. She reports L wrist pain which radiates to her mid forearm. Denies open wounds. She is currently on Coumadin. She denies hitting her head, LOC, neck pain, N/V. She also has some right knee pain but states "it doesn't really hurt".  HPI  Past Medical History:  Diagnosis Date  . 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 Not current problem.    . Angiodysplasia of intestine (without mention of hemorrhage)   . Anxiety state, unspecified   . Atrial fibrillation ([place])   . Diverticulosis of colon (without mention of hemorrhage)   . Diverticulosis of colon with hemorrhage 06/12/2014  . Esophageal reflux   . Macular degeneration (senile) of retina, unspecified   . 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 Osteoarthrosis, unspecified whether generalized or localized, unspecified site   . Osteoporosis, unspecified   . Other and unspecified hyperlipidemia   . Unspecified venous (peripheral) insufficiency     Patient Active Problem List   Diagnosis Date Noted  . Posterior vitreous detachment, bilateral 05/25/2016  . Essential hypertension 04/27/2016  . Epigastric hernia 12/23/2014  . Nuclear cataract of both eyes 11/08/2014  . Diverticulitis of colon 06/12/2014  . CKD (chronic kidney disease), stage II 03/26/2014  . Encounter for long-term (current) use of anticoagulants 03/20/2012  . 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 Leg cramps 12/30/2011  . Macular hole 08/31/2011  . Vitamin D deficiency 04/01/2011  . Anxiety state 10/03/2007  .  Advanced nonexudative age-related macular degeneration of both eyes without subfoveal involvement 10/03/2007  . ATRIAL FIBRILLATION, CHRONIC 10/03/2007  . VENOUS INSUFFICIENCY 10/03/2007  . GERD 10/03/2007  . DEGENERATIVE JOINT DISEASE 10/03/2007  . Osteoporosis 10/03/2007  . Hyperlipidemia 10/02/2007  . Angiodysplasia of colon 06/01/2004    Past Surgical History:  Procedure Laterality Date  . APPENDECTOMY  1951  . LAPAROTOMY  03/07/2012   Procedure: EXPLORATORY LAPAROTOMY;  Surgeon: Angelia Mould, MD;  Location: [facility] OR;  Service: Vascular;  Laterality: N/A;  Exploratory Laparotomy with Evacuation of Hematoma.  . TONSILLECTOMY  1943    OB History    No data available       Home Medications    Prior to Admission medications   Medication Sig Start Date End Date Taking? Authorizing Provider  ALPRAZolam Duanne Moron) 0.5 MG tablet Take 0.5 mg by mouth 3 (three) times daily as needed for anxiety.    Historical Provider, MD  CARTIA XT 180 MG 24 hr capsule TAKE ONE CAPSULE BY MOUTH ONCE DAILY 04/28/16   Larey Dresser, MD  Cholecalciferol (VITAMIN D) 2000 UNITS CAPS Take 1 capsule by mouth daily.    Historical Provider, MD  metoprolol succinate (TOPROL-XL) 25 MG 24 hr tablet TAKE ONE TABLET BY MOUTH ONCE DAILY 04/28/16   Larey Dresser, MD  Multiple Vitamin (MULTIVITAMIN WITH MINERALS) TABS Take 1 tablet by mouth daily.    Historical Provider, MD  multivitamin-lutein [facility]) CAPS Take 1 capsule by mouth daily.    Historical Provider, MD  simvastatin (ZOCOR) 20 MG tablet TAKE ONE TABLET BY MOUTH AT BEDTIME 12/29/15   Marin Olp, MD  warfarin (COUMADIN) 3 MG tablet TAKE AS DIRECTED BY COUMADIN CLINIC. 06/10/16   Larey Dresser, MD    Family History Family History  Problem Relation Age of Onset  . Hemochromatosis Father     tested in [place], denies history  . Heart disease Sister   . Breast cancer Sister     died 31, not sure when she had it  . Lymphoma Sister      Social History Social History  Substance Use Topics  . Smoking status: Former Smoker    Packs/day: 0.25    Years: 2.00    Types: Cigarettes    Quit date: 08/24/1951  . Smokeless tobacco: Never Used     Comment: smoked at age 60 for a short time--a few months  . Alcohol use No     Allergies   Penicillins; Alendronate sodium; Atorvastatin; and Morphine and related   Review of Systems Review of Systems  Musculoskeletal: Positive for arthralgias and joint swelling. Negative for back pain, gait problem, myalgias and neck pain.  Skin: Negative for wound.  Neurological: Negative for dizziness, syncope, light-headedness and headaches.  All other systems reviewed and are negative.    Physical Exam Updated Vital Signs Ht 5\' 5"  (1.651 m)   Wt 66.7 kg   SpO2 100%   BMI 24.46 kg/m   Physical Exam  Constitutional: She is oriented to person, place, and time. She appears well-developed and well-nourished. No distress.  HENT:  Head: Normocephalic and atraumatic.  Eyes: Conjunctivae are normal. Pupils are equal, round, and reactive to light. Right eye exhibits no discharge. Left eye exhibits no discharge. No scleral icterus.  Neck: Normal range of motion. Neck supple.  Lipoma noted. No midline tenderness  Cardiovascular: Normal rate and regular rhythm.   No murmur heard. Pulmonary/Chest: Effort normal and breath sounds normal. No respiratory distress.  Abdominal: Soft. She exhibits no distension. There is no tenderness.  Musculoskeletal:  Left wrist: There is a hematoma over the dorsal lateral aspect of wrist. FROM of wrist. Mild tenderness to palpation over the whole wrist. No tenderness of forearm, elbow, fingers. N/V intact.  Right knee: There is swelling over the medial compartment with mild tenderness to palpation. FROM. Gait is normal.   Neurological: She is alert and oriented to person, place, and time.  Skin: Skin is warm and dry.  Psychiatric: She has a normal mood and  affect. Her behavior is normal.  Nursing note and vitals reviewed.    ED Treatments / Results  Labs (all labs ordered are listed, but only abnormal results are displayed) Labs Reviewed - No data to display  EKG  EKG Interpretation None       Radiology Dg Wrist Complete Left  Result Date: 06/30/2016 CLINICAL DATA:  Status post fall today with radial aspect wrist pain EXAM: LEFT WRIST - COMPLETE 3+ VIEW COMPARISON:  Left hand series of today's date FINDINGS: The bones are osteopenic. There is an impacted fracture of the distal left radial metaphysis. There is a tiny ulnar styloid fracture. The radiocarpal and ulnocarpal joint spaces are preserved. The carpal bones appear intact. There is moderate joint space loss with articular surface eburnation of the first CMC joint. The metacarpals appear intact. IMPRESSION: There is an impacted mildly angulated fracture of the distal left radial metaphysis. There is an ulnar styloid fracture is well. Moderate osteoarthritic change of the first  CMC joint. Electronically Signed   By: David  Martinique M.D.   On: 06/30/2016 12:55   Dg Hand Complete Left  Result Date: 06/30/2016 CLINICAL DATA:  Status post fall today with left radial aspect wrist pain. EXAM: LEFT HAND - COMPLETE 3+ VIEW COMPARISON:  Left wrist series of today's date. FINDINGS: The bones are diffusely osteopenic. There is narrowing of the IP joints of all digits. There is very mild narrowing of the MCP joints diffusely. There is moderate narrowing and articular surface eburnation of the first CMC joint. No phalangeal or metacarpal fracture is observed. The carpal bones appear intact. The patient has a known impacted fracture of the distal left radial metaphysis. IMPRESSION: No acute fracture of the bones of the left hand in this patient with a known distal radial metaphyseal fracture. There is moderate degenerative change of the first [facility] joint. There is mild diffuse degenerative narrowing of the  IP joints. Electronically Signed   By: David  Martinique M.D.   On: 06/30/2016 12:57    Procedures Procedures (including critical care time)  Medications Ordered in ED Medications - No data to display   Initial Impression / Assessment and Plan / ED Course  I have reviewed the triage vital signs and the nursing notes.  Pertinent labs & imaging results that were available during my care of the patient were reviewed by me and considered in my medical decision making (see chart for details).  Clinical Course    82 year old female with distal radial fracture and ulnar styloid fracture. Placed in sugar tong splint. She is very anxious to leave and declines pain medicines. Advised ortho follow up. Patient verbalized understanding. She is ambulatory and refuses R knee xray. Shared visit with Dr. Venora Maples. Opportunity for questions provided and all questions answered. Return precautions given.   Final Clinical Impressions(s) / ED Diagnoses   Final diagnoses:  Fall, initial encounter  Closed fracture of distal end of left radius, unspecified fracture morphology, initial encounter  Closed traumatic minimally displaced fracture of styloid process of left ulna, initial encounter    New Prescriptions Discharge Medication List as of 06/30/2016  1:32 PM       Recardo Evangelist, PA-C 06/30/16 [place], MD 06/30/16 [phone]

## 2016-06-30 NOTE — ED Triage Notes (Signed)
Patient fell early this morning.  She has injury to left hand.  Denies hitting her head.  Denies LOC, N/V/D, black outs.  Patient has limited range of motion with left hand.

## 2016-06-30 NOTE — Discharge Instructions (Signed)
Please follow up with orthopedic doctor following your visit today You can use ice to reduce swelling Take Ibuprofen or Tylenol for pain

## 2016-07-07 DIAGNOSIS — S52552A Other extraarticular fracture of lower end of left radius, initial encounter for closed fracture: Secondary | ICD-10-CM | POA: Diagnosis not present

## 2016-07-08 ENCOUNTER — Ambulatory Visit (INDEPENDENT_AMBULATORY_CARE_PROVIDER_SITE_OTHER): Payer: Medicare Other

## 2016-07-08 ENCOUNTER — Ambulatory Visit: Payer: Medicare Other | Admitting: Family Medicine

## 2016-07-08 DIAGNOSIS — Z7901 Long term (current) use of anticoagulants: Secondary | ICD-10-CM | POA: Diagnosis not present

## 2016-07-08 DIAGNOSIS — K55069 Acute infarction of intestine, part and extent unspecified: Secondary | ICD-10-CM

## 2016-07-08 LAB — POCT INR: INR: 3.1

## 2016-07-21 DIAGNOSIS — S52552D Other extraarticular fracture of lower end of left radius, subsequent encounter for closed fracture with routine healing: Secondary | ICD-10-CM | POA: Diagnosis not present

## 2016-07-29 ENCOUNTER — Ambulatory Visit (INDEPENDENT_AMBULATORY_CARE_PROVIDER_SITE_OTHER): Payer: Medicare Other | Admitting: *Deleted

## 2016-07-29 DIAGNOSIS — Z7901 Long term (current) use of anticoagulants: Secondary | ICD-10-CM

## 2016-07-29 DIAGNOSIS — K55069 Acute infarction of intestine, part and extent unspecified: Secondary | ICD-10-CM | POA: Diagnosis not present

## 2016-07-29 LAB — POCT INR: INR: 2.3

## 2016-08-10 DIAGNOSIS — M25532 Pain in left wrist: Secondary | ICD-10-CM | POA: Diagnosis not present

## 2016-08-12 ENCOUNTER — Other Ambulatory Visit: Payer: Self-pay | Admitting: Family Medicine

## 2016-08-24 ENCOUNTER — Ambulatory Visit (INDEPENDENT_AMBULATORY_CARE_PROVIDER_SITE_OTHER): Payer: Medicare Other | Admitting: *Deleted

## 2016-08-24 DIAGNOSIS — Z7901 Long term (current) use of anticoagulants: Secondary | ICD-10-CM

## 2016-08-24 DIAGNOSIS — K55069 Acute infarction of intestine, part and extent unspecified: Secondary | ICD-10-CM

## 2016-08-24 LAB — POCT INR: INR: 2.4

## 2016-09-13 DIAGNOSIS — S52552D Other extraarticular fracture of lower end of left radius, subsequent encounter for closed fracture with routine healing: Secondary | ICD-10-CM | POA: Diagnosis not present

## 2016-09-23 ENCOUNTER — Ambulatory Visit (INDEPENDENT_AMBULATORY_CARE_PROVIDER_SITE_OTHER): Payer: Medicare Other | Admitting: *Deleted

## 2016-09-23 DIAGNOSIS — K55069 Acute infarction of intestine, part and extent unspecified: Secondary | ICD-10-CM

## 2016-09-23 DIAGNOSIS — Z7901 Long term (current) use of anticoagulants: Secondary | ICD-10-CM | POA: Diagnosis not present

## 2016-09-23 LAB — POCT INR: INR: 2.5

## 2016-10-21 ENCOUNTER — Ambulatory Visit (INDEPENDENT_AMBULATORY_CARE_PROVIDER_SITE_OTHER): Payer: Medicare Other | Admitting: *Deleted

## 2016-10-21 DIAGNOSIS — Z7901 Long term (current) use of anticoagulants: Secondary | ICD-10-CM | POA: Diagnosis not present

## 2016-10-21 DIAGNOSIS — K55069 Acute infarction of intestine, part and extent unspecified: Secondary | ICD-10-CM

## 2016-10-21 LAB — POCT INR: INR: 2

## 2016-10-26 ENCOUNTER — Ambulatory Visit (INDEPENDENT_AMBULATORY_CARE_PROVIDER_SITE_OTHER): Payer: Medicare Other | Admitting: Family Medicine

## 2016-10-26 ENCOUNTER — Encounter: Payer: Self-pay | Admitting: Family Medicine

## 2016-10-26 VITALS — BP 126/78 | HR 87 | Temp 97.4°F | Ht 65.0 in | Wt 148.0 lb

## 2016-10-26 DIAGNOSIS — M1712 Unilateral primary osteoarthritis, left knee: Secondary | ICD-10-CM

## 2016-10-26 DIAGNOSIS — E785 Hyperlipidemia, unspecified: Secondary | ICD-10-CM

## 2016-10-26 DIAGNOSIS — K55069 Acute infarction of intestine, part and extent unspecified: Secondary | ICD-10-CM | POA: Diagnosis not present

## 2016-10-26 DIAGNOSIS — I482 Chronic atrial fibrillation, unspecified: Secondary | ICD-10-CM

## 2016-10-26 DIAGNOSIS — I10 Essential (primary) hypertension: Secondary | ICD-10-CM

## 2016-10-26 NOTE — Assessment & Plan Note (Signed)
S: controlled on metoprolol and diltiazem.  BP Readings from Last 3 Encounters:  10/26/16 126/78  06/30/16 (!) 155/116  06/28/16 124/78  A/P:Continue current meds:  Doing well

## 2016-10-26 NOTE — Assessment & Plan Note (Signed)
03/03/2012 epigastric abdominal pain led to surgery. No abdominal pain at present. Tried to change to history of SMA thrombosis but unable to find this diagnosis code

## 2016-10-26 NOTE — Assessment & Plan Note (Signed)
S: on coumadin. Still take metoprol for rate control and diltiazem 180mg  24 hr.  A/P: rate controlled- continue current medications

## 2016-10-26 NOTE — Assessment & Plan Note (Signed)
S: well controlled on simvastatin 20mg . No myalgias.  Lab Results  Component Value Date   CHOL 146 04/27/2016   HDL 53.50 04/27/2016   LDLCALC 65 04/27/2016   TRIG 136.0 04/27/2016   CHOLHDL 3 04/27/2016   A/P: doing well, continue current meds

## 2016-10-26 NOTE — Progress Notes (Signed)
Pre visit review using our clinic review tool, if applicable. No additional management support is needed unless otherwise documented below in the visit note. 

## 2016-10-26 NOTE — Progress Notes (Signed)
Subjective:  Lisa Douglas is a 83 y.o. year old very pleasant female patient who presents for/with See problem oriented charting ROS- left knee pain. No chest pain or shortness of breath. No fever, cough   Past Medical History-  Patient Active Problem List   Diagnosis Date Noted  . ATRIAL FIBRILLATION, CHRONIC 10/03/2007    Priority: High  . Essential hypertension 04/27/2016    Priority: Medium  . 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 Anxiety state 10/03/2007    Priority: Medium  . Osteoporosis 10/03/2007    Priority: Medium  . Hyperlipidemia 10/02/2007    Priority: Medium  . Angiodysplasia of colon 06/01/2004    Priority: Medium  . Epigastric hernia 12/23/2014    Priority: Low  . Diverticulitis of colon 06/12/2014    Priority: Low  . CKD (chronic kidney disease), stage II 03/26/2014    Priority: Low  . Long term current use of anticoagulant therapy 03/20/2012    Priority: Low  . Leg cramps 12/30/2011    Priority: Low  . Vitamin D deficiency 04/01/2011    Priority: Low  . Advanced nonexudative age-related macular degeneration of both eyes without subfoveal involvement 10/03/2007    Priority: Low  . VENOUS INSUFFICIENCY 10/03/2007    Priority: Low  . GERD 10/03/2007    Priority: Low  . DEGENERATIVE JOINT DISEASE 10/03/2007    Priority: Low  . Osteoarthritis of left knee 10/26/2016  . Posterior vitreous detachment, bilateral 05/25/2016  . Nuclear cataract of both eyes 11/08/2014  . Macular hole 08/31/2011    Medications- reviewed and updated Current Outpatient Prescriptions  Medication Sig Dispense Refill  . CARTIA XT 180 MG 24 hr capsule TAKE ONE CAPSULE BY MOUTH ONCE DAILY 30 capsule 11  . Cholecalciferol (VITAMIN D) 2000 UNITS CAPS Take 1 capsule by mouth daily.    . metoprolol succinate (TOPROL-XL) 25 MG 24 hr tablet TAKE ONE TABLET BY MOUTH ONCE DAILY 30 tablet 11  . Multiple Vitamin (MULTIVITAMIN WITH MINERALS) TABS  Take 1 tablet by mouth daily.    . multivitamin-lutein (OCUVITE-LUTEIN) CAPS Take 1 capsule by mouth daily.    . simvastatin (ZOCOR) 20 MG tablet TAKE ONE TABLET BY MOUTH AT BEDTIME 30 tablet 5  . warfarin (COUMADIN) 3 MG tablet TAKE AS DIRECTED BY COUMADIN CLINIC. (Patient taking differently: Take 1.5 mg by mouth every evening. TAKE AS DIRECTED BY COUMADIN CLINIC.) 20 tablet 3   No current facility-administered medications for this visit.     Objective: BP 126/78 (BP Location: Left Arm, Patient Position: Sitting, Cuff Size: Normal)   Pulse 87   Temp 97.4 F (36.3 C) (Oral)   Ht 5\' 5"  (1.651 m)   Wt 148 lb (67.1 kg)   SpO2 93%   BMI 24.63 kg/m  Gen: NAD, resting comfortably, appears stated age CV: irregularly irregular no murmurs rubs or gallops Lungs: CTAB no crackles, wheeze, rhonchi Abdomen: normal weight Ext: no edema Skin: warm, dry, no rash Ambulates without assist  Assessment/Plan:  Hyperlipidemia S: well controlled on simvastatin 20mg . No myalgias.  Lab Results  Component Value Date   CHOL 146 04/27/2016   HDL 53.50 04/27/2016   LDLCALC 65 04/27/2016   TRIG 136.0 04/27/2016   CHOLHDL 3 04/27/2016   A/P: doing well, continue current meds   Essential hypertension S: controlled on metoprolol and diltiazem.  BP Readings from Last 3 Encounters:  10/26/16 126/78  06/30/16 (!) 155/116  06/28/16 124/78  A/P:Continue current meds:  Doing well   ATRIAL FIBRILLATION, CHRONIC S: on coumadin. Still take metoprol for rate control and diltiazem 180mg  24 hr.  A/P: rate controlled- continue current medications  Superior mesenteric artery thrombosis 03/03/2012 epigastric abdominal pain led to surgery. No abdominal pain at present. Tried to change to history of SMA thrombosis but unable to find this diagnosis code  Osteoarthritis of left knee Left knee pain- seems to be worse at night. Putting a pillow over it and getting it warm seems to help.Does not want to try  biofreeze, icy hot, or tylenol before bed. She wanst to consider voltaren gel- likely low risk but with coumadin advised trialing other steps first. She also asks about injection and stated would try conservative measures first    Advised 6 month annual wellness visit.   Return precautions advised.  Garret Reddish, MD

## 2016-10-26 NOTE — Assessment & Plan Note (Signed)
Left knee pain- seems to be worse at night. Putting a pillow over it and getting it warm seems to help.Does not want to try biofreeze, icy hot, or tylenol before bed. She wanst to consider voltaren gel- likely low risk but with coumadin advised trialing other steps first. She also asks about injection and stated would try conservative measures first

## 2016-10-26 NOTE — Patient Instructions (Signed)
Show our schedulers this-  I want you to schedule an annual wellness visit with Lisa Douglas in 6 months- and then see me the same day after that visit likely  No changes today  Will update bloodwork next time

## 2016-11-02 ENCOUNTER — Other Ambulatory Visit: Payer: Self-pay | Admitting: Cardiology

## 2016-11-16 DIAGNOSIS — Z85820 Personal history of malignant melanoma of skin: Secondary | ICD-10-CM | POA: Diagnosis not present

## 2016-11-16 DIAGNOSIS — D225 Melanocytic nevi of trunk: Secondary | ICD-10-CM | POA: Diagnosis not present

## 2016-11-16 DIAGNOSIS — L570 Actinic keratosis: Secondary | ICD-10-CM | POA: Diagnosis not present

## 2016-11-16 DIAGNOSIS — D1801 Hemangioma of skin and subcutaneous tissue: Secondary | ICD-10-CM | POA: Diagnosis not present

## 2016-11-16 DIAGNOSIS — L821 Other seborrheic keratosis: Secondary | ICD-10-CM | POA: Diagnosis not present

## 2016-11-16 DIAGNOSIS — L812 Freckles: Secondary | ICD-10-CM | POA: Diagnosis not present

## 2016-11-16 DIAGNOSIS — D2261 Melanocytic nevi of right upper limb, including shoulder: Secondary | ICD-10-CM | POA: Diagnosis not present

## 2016-11-18 ENCOUNTER — Ambulatory Visit (INDEPENDENT_AMBULATORY_CARE_PROVIDER_SITE_OTHER): Payer: Medicare Other

## 2016-11-18 DIAGNOSIS — I482 Chronic atrial fibrillation, unspecified: Secondary | ICD-10-CM

## 2016-11-18 DIAGNOSIS — Z7901 Long term (current) use of anticoagulants: Secondary | ICD-10-CM | POA: Diagnosis not present

## 2016-11-18 DIAGNOSIS — K55069 Acute infarction of intestine, part and extent unspecified: Secondary | ICD-10-CM | POA: Diagnosis not present

## 2016-11-18 LAB — POCT INR: INR: 2.6

## 2016-11-23 DIAGNOSIS — H35342 Macular cyst, hole, or pseudohole, left eye: Secondary | ICD-10-CM | POA: Diagnosis not present

## 2016-11-23 DIAGNOSIS — H2513 Age-related nuclear cataract, bilateral: Secondary | ICD-10-CM | POA: Diagnosis not present

## 2016-11-23 DIAGNOSIS — H353133 Nonexudative age-related macular degeneration, bilateral, advanced atrophic without subfoveal involvement: Secondary | ICD-10-CM | POA: Diagnosis not present

## 2016-11-23 DIAGNOSIS — H43813 Vitreous degeneration, bilateral: Secondary | ICD-10-CM | POA: Diagnosis not present

## 2016-12-16 ENCOUNTER — Ambulatory Visit (INDEPENDENT_AMBULATORY_CARE_PROVIDER_SITE_OTHER): Payer: Medicare Other | Admitting: Pharmacist

## 2016-12-16 DIAGNOSIS — I482 Chronic atrial fibrillation, unspecified: Secondary | ICD-10-CM

## 2016-12-16 DIAGNOSIS — Z7901 Long term (current) use of anticoagulants: Secondary | ICD-10-CM | POA: Diagnosis not present

## 2016-12-16 DIAGNOSIS — K55069 Acute infarction of intestine, part and extent unspecified: Secondary | ICD-10-CM | POA: Diagnosis not present

## 2016-12-16 LAB — POCT INR: INR: 1.6

## 2016-12-30 ENCOUNTER — Ambulatory Visit (INDEPENDENT_AMBULATORY_CARE_PROVIDER_SITE_OTHER): Payer: Medicare Other | Admitting: *Deleted

## 2016-12-30 DIAGNOSIS — I482 Chronic atrial fibrillation, unspecified: Secondary | ICD-10-CM

## 2016-12-30 DIAGNOSIS — K55069 Acute infarction of intestine, part and extent unspecified: Secondary | ICD-10-CM | POA: Diagnosis not present

## 2016-12-30 DIAGNOSIS — Z7901 Long term (current) use of anticoagulants: Secondary | ICD-10-CM | POA: Diagnosis not present

## 2016-12-30 LAB — POCT INR: INR: 2.2

## 2017-01-25 ENCOUNTER — Ambulatory Visit (INDEPENDENT_AMBULATORY_CARE_PROVIDER_SITE_OTHER): Payer: Medicare Other

## 2017-01-25 DIAGNOSIS — K55069 Acute infarction of intestine, part and extent unspecified: Secondary | ICD-10-CM

## 2017-01-25 DIAGNOSIS — Z7901 Long term (current) use of anticoagulants: Secondary | ICD-10-CM | POA: Diagnosis not present

## 2017-01-25 DIAGNOSIS — I482 Chronic atrial fibrillation, unspecified: Secondary | ICD-10-CM

## 2017-01-25 LAB — POCT INR: INR: 2.1

## 2017-02-24 ENCOUNTER — Ambulatory Visit (INDEPENDENT_AMBULATORY_CARE_PROVIDER_SITE_OTHER): Payer: Medicare Other | Admitting: *Deleted

## 2017-02-24 DIAGNOSIS — I482 Chronic atrial fibrillation, unspecified: Secondary | ICD-10-CM

## 2017-02-24 DIAGNOSIS — Z7901 Long term (current) use of anticoagulants: Secondary | ICD-10-CM

## 2017-02-24 DIAGNOSIS — K55069 Acute infarction of intestine, part and extent unspecified: Secondary | ICD-10-CM

## 2017-02-24 LAB — POCT INR: INR: 1.8

## 2017-03-10 ENCOUNTER — Ambulatory Visit (INDEPENDENT_AMBULATORY_CARE_PROVIDER_SITE_OTHER): Payer: Medicare Other | Admitting: *Deleted

## 2017-03-10 DIAGNOSIS — I482 Chronic atrial fibrillation, unspecified: Secondary | ICD-10-CM

## 2017-03-10 DIAGNOSIS — K55069 Acute infarction of intestine, part and extent unspecified: Secondary | ICD-10-CM

## 2017-03-10 DIAGNOSIS — Z7901 Long term (current) use of anticoagulants: Secondary | ICD-10-CM | POA: Diagnosis not present

## 2017-03-10 LAB — POCT INR: INR: 2.7

## 2017-03-17 DIAGNOSIS — H353133 Nonexudative age-related macular degeneration, bilateral, advanced atrophic without subfoveal involvement: Secondary | ICD-10-CM | POA: Diagnosis not present

## 2017-03-17 DIAGNOSIS — H43813 Vitreous degeneration, bilateral: Secondary | ICD-10-CM | POA: Diagnosis not present

## 2017-03-17 DIAGNOSIS — H2513 Age-related nuclear cataract, bilateral: Secondary | ICD-10-CM | POA: Diagnosis not present

## 2017-03-17 DIAGNOSIS — H5203 Hypermetropia, bilateral: Secondary | ICD-10-CM | POA: Diagnosis not present

## 2017-03-29 ENCOUNTER — Ambulatory Visit (INDEPENDENT_AMBULATORY_CARE_PROVIDER_SITE_OTHER): Payer: Medicare Other | Admitting: *Deleted

## 2017-03-29 ENCOUNTER — Encounter: Payer: Self-pay | Admitting: Nurse Practitioner

## 2017-03-29 ENCOUNTER — Ambulatory Visit (INDEPENDENT_AMBULATORY_CARE_PROVIDER_SITE_OTHER): Payer: Medicare Other | Admitting: Nurse Practitioner

## 2017-03-29 VITALS — BP 140/80 | HR 97 | Ht 65.0 in | Wt 151.0 lb

## 2017-03-29 DIAGNOSIS — Z7901 Long term (current) use of anticoagulants: Secondary | ICD-10-CM | POA: Diagnosis not present

## 2017-03-29 DIAGNOSIS — K55069 Acute infarction of intestine, part and extent unspecified: Secondary | ICD-10-CM

## 2017-03-29 DIAGNOSIS — I482 Chronic atrial fibrillation, unspecified: Secondary | ICD-10-CM

## 2017-03-29 LAB — POCT INR: INR: 2.2

## 2017-03-29 NOTE — Patient Instructions (Addendum)
We will be checking the following labs today - BMET and CBC   Medication Instructions:    Continue with your current medicines.     Testing/Procedures To Be Arranged:  N/A  Follow-Up:   See me in one year.     Other Special Instructions:   N/A    If you need a refill on your cardiac medications before your next appointment, please call your pharmacy.   Call the [place] office at [phone] if you have any questions, problems or concerns.

## 2017-03-29 NOTE — Progress Notes (Signed)
CARDIOLOGY OFFICE NOTE  Date:  03/29/2017    Lisa Douglas Date of Birth: 10-18-1927 Medical Record #[id number]  PCP:  Marin Olp, MD  Cardiologist:  Servando Snare     Chief Complaint  Patient presents with  . Atrial Fibrillation    1 year check - former patient of Dr. Claris Gladden    History of Present Illness: Lisa Douglas is a 83 y.o. female who presents today for a one year check. Seen for Dr. Aundra Dubin.   She has a history of chronic atrial fibrillation and prior embolic SMA occlusion requiring embolectomy (off coumadin at the time). She has a history of GI bleeding from gastric AVMs.   I saw her a year ago - was felt to be doing ok from our standpoint. Not bleeding.  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 She had had a good year.    Comes in today. Here alone. She is doing ok. But has had pneumonia and a broken arm this year. Fracture due to a fall. Has not recurred. No surgery required.  No medicines yet today. Her great great niece is living with her for another month and then she will be back by herself. She is planning on cataract surgery later this month - she was to stop her coumadin for 2 days - then says this was "optional" however, our coumadin clinic has advised to NOT stop coumadin given her history of thrombosis. No chest pain. She will have some DOE if she over exerts. Does tire easily. She stays active and still does her housework/shopping/driving.   PMH: 1. Hyperlipidemia 2. Gastric AVMs with history of GI bleeding. 3. GERD 4. Atrial fibrillation: Chronic. She had suspected cardioembolic SMA occlusion in 7/13 requiring SMA embolectomy. Coumadin begun at this time.  5. Macular degeneration. 6. Diverticulosis. 7. Echo (7/13) with EF 60%, mild MR.    Past Medical History:  Diagnosis Date  . 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 Not current problem.    . Angiodysplasia  of intestine (without mention of hemorrhage)   . Anxiety state, unspecified   . Atrial fibrillation ([place])   . Diverticulosis of colon (without mention of hemorrhage)   . Diverticulosis of colon with hemorrhage 06/12/2014  . Esophageal reflux   . Macular degeneration (senile) of retina, unspecified   . 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 Osteoarthrosis, unspecified whether generalized or localized, unspecified site   . Osteoporosis, unspecified   . Other and unspecified hyperlipidemia   . Unspecified venous (peripheral) insufficiency     Past Surgical History:  Procedure Laterality Date  . APPENDECTOMY  1951  . LAPAROTOMY  03/07/2012   Procedure: EXPLORATORY LAPAROTOMY;  Surgeon: Angelia Mould, MD;  Location: [facility] OR;  Service: Vascular;  Laterality: N/A;  Exploratory Laparotomy with Evacuation of Hematoma.  . TONSILLECTOMY  1943     Medications: Current Meds  Medication Sig  . CARTIA XT 180 MG 24 hr capsule TAKE ONE CAPSULE BY MOUTH ONCE DAILY  . Cholecalciferol (VITAMIN D) 2000 UNITS CAPS Take 1 capsule by mouth daily.  . metoprolol succinate (TOPROL-XL) 25 MG 24 hr tablet TAKE ONE TABLET BY MOUTH ONCE DAILY  . Multiple Vitamin (MULTIVITAMIN WITH MINERALS) TABS Take 1 tablet by mouth daily.  . multivitamin-lutein (OCUVITE-LUTEIN) CAPS Take 1 capsule by mouth daily.  . simvastatin (ZOCOR) 20 MG tablet TAKE ONE TABLET BY MOUTH AT BEDTIME  . warfarin (COUMADIN)  3 MG tablet TAKE BY MOUTH AS DIRECTED BY COUMADIN CLINIC     Allergies: Allergies  Allergen Reactions  . Penicillins Swelling    Angioedema Has patient had a PCN reaction causing immediate rash, facial/tongue/throat swelling, SOB or lightheadedness with hypotension: yes Has patient had a PCN reaction causing severe rash involving mucus membranes or skin necrosis: no Has patient had a PCN reaction that required hospitalization: no Has patient had a PCN reaction occurring within the last 10 years: no- about 70 yrs ago If  all of the above answers are "NO", then may proceed with Cephalosporin use.   . Alendronate Sodium     REACTION: INTOL to Fosamax per pt.  . Atorvastatin     REACTION: INTOL to Lipitor per pt.  . Morphine And Related     Makes her mean per pt    Social History: The patient  reports that she quit smoking about 65 years ago. Her smoking use included Cigarettes. She has a 0.50 pack-year smoking history. She has never used smokeless tobacco. She reports that she does not drink alcohol or use drugs.   Family History: The patient's family history includes Breast cancer in her sister; Heart disease in her sister; Hemochromatosis in her father; Lymphoma in her sister.   Review of Systems: Please see the history of present illness.   Otherwise, the review of systems is positive for none.   All other systems are reviewed and negative.   Physical Exam: VS:  BP 140/80 (BP Location: Left Arm, Patient Position: Sitting, Cuff Size: Normal)   Pulse 97   Ht 5\' 5"  (1.651 m)   Wt 151 lb (68.5 kg)   BMI 25.13 kg/m  .  BMI Body mass index is 25.13 kg/m.  Wt Readings from Last 3 Encounters:  03/29/17 151 lb (68.5 kg)  10/26/16 148 lb (67.1 kg)  06/30/16 147 lb (66.7 kg)    General: Elderly female who is alert and in no acute distress.   HEENT: Normal.  Neck: Supple, no JVD, carotid bruits, or masses noted.  Cardiac: Irregular irregular rhythm. Rate is ok. No murmurs, rubs, or gallops. No edema.  Respiratory:  Lungs are clear to auscultation bilaterally with normal work of breathing.  GI: Soft and nontender.  MS: No deformity or atrophy. Gait and ROM intact.  Skin: Warm and dry. Color is normal.  Neuro:  Strength and sensation are intact and no gross focal deficits noted.  Psych: Alert, appropriate and with normal affect.   LABORATORY DATA:  EKG:  EKG is ordered today. This demonstrates chronic AF with fair control - rate today is 97 (no medicines today)  Lab Results  Component Value Date    WBC 8.0 06/28/2016   HGB 15.0 06/28/2016   HCT 44.7 06/28/2016   PLT 206.0 06/28/2016   GLUCOSE 96 04/27/2016   CHOL 146 04/27/2016   TRIG 136.0 04/27/2016   HDL 53.50 04/27/2016   LDLCALC 65 04/27/2016   ALT 16 04/27/2016   AST 22 04/27/2016   NA 141 04/27/2016   K 4.5 04/27/2016   CL 103 04/27/2016   CREATININE 0.76 04/27/2016   BUN 17 04/27/2016   CO2 32 04/27/2016   TSH 2.39 04/27/2016   INR 2.2 03/29/2017   Lab Results  Component Value Date   INR 2.2 03/29/2017   INR 2.7 03/10/2017   INR 1.8 02/24/2017     BNP (last 3 results) No results for input(s): BNP in the last 8760 hours.  ProBNP (  last 3 results) No results for input(s): PROBNP in the last 8760 hours.   Other Studies Reviewed Today:  Echo Study Conclusions from 02/2012  - Left ventricle: The cavity size was normal. Wall thickness was normal. The estimated ejection fraction was 60%. Wall motion was normal; there were no regional wall motion abnormalities. - Mitral valve: Mild regurgitation. - Left atrium: The atrium was mildly dilated. - Right atrium: The atrium was mildly dilated. - Pulmonary arteries: PA peak pressure: 39mm Hg (S).  Assessment/Plan:  1. Atrial fibrillation: Chronic. Remains on Toprol XL and diltiazem CD. Given history of embolic SMA occlusion when off warfarin in the past, she will need Lovenox bridge if she needs to stop warfarin in the future. She is doing well on her current regimen. Baseline lab today.   2. History of gastric AVMs: No bleeding on coumadin so far. Would like to get her lab rechecked today.   3. HLD - on statin therapy - seeing PCP next month for lipids and visit.   4. Advanced age  60. Need for eye surgery - pharmacist here has advised NOT to hold coumadin.    Current medicines are reviewed with the patient today.  The patient does not have concerns regarding medicines other than what has been noted above.  The following changes have been  made:  See above.  Labs/ tests ordered today include:    Orders Placed This Encounter  Procedures  . Basic metabolic panel  . CBC  . EKG 12-Lead     Disposition:   FU with me in 1 year.   Patient is agreeable to this plan and will call if any problems develop in the interim.   SignedTruitt Merle, NP  03/29/2017 2:00 PM  [facility] [address] [place], [place]  [address] Phone: [phone] Fax: [phone]

## 2017-03-30 LAB — BASIC METABOLIC PANEL
BUN/Creatinine Ratio: 18 (ref 12–28)
BUN: 14 mg/dL (ref 8–27)
CO2: 24 mmol/L (ref 20–29)
Calcium: 9.3 mg/dL (ref 8.7–10.3)
Chloride: 102 mmol/L (ref 96–106)
Creatinine, Ser: 0.78 mg/dL (ref 0.57–1.00)
GFR calc Af Amer: 79 mL/min/{1.73_m2} (ref >59)
GFR calc non Af Amer: 69 mL/min/{1.73_m2} (ref >59)
Glucose: 88 mg/dL (ref 65–99)
Potassium: 4.3 mmol/L (ref 3.5–5.2)
Sodium: 142 mmol/L (ref 134–144)

## 2017-03-30 LAB — CBC
Hematocrit: 43.8 % (ref 34.0–46.6)
Hemoglobin: 14 g/dL (ref 11.1–15.9)
MCH: 30.6 pg (ref 26.6–33.0)
MCHC: 32 g/dL (ref 31.5–35.7)
MCV: 96 fL (ref 79–97)
Platelets: 215 10*3/uL (ref 150–379)
RBC: 4.58 x10E6/uL (ref 3.77–5.28)
RDW: 13.7 % (ref 12.3–15.4)
WBC: 6.5 10*3/uL (ref 3.4–10.8)

## 2017-04-10 ENCOUNTER — Other Ambulatory Visit: Payer: Self-pay | Admitting: Cardiology

## 2017-04-18 ENCOUNTER — Ambulatory Visit (INDEPENDENT_AMBULATORY_CARE_PROVIDER_SITE_OTHER): Payer: Medicare Other | Admitting: *Deleted

## 2017-04-18 DIAGNOSIS — I482 Chronic atrial fibrillation, unspecified: Secondary | ICD-10-CM

## 2017-04-18 DIAGNOSIS — K55069 Acute infarction of intestine, part and extent unspecified: Secondary | ICD-10-CM | POA: Diagnosis not present

## 2017-04-18 DIAGNOSIS — Z7901 Long term (current) use of anticoagulants: Secondary | ICD-10-CM | POA: Diagnosis not present

## 2017-04-18 LAB — POCT INR: INR: 2.4

## 2017-04-19 DIAGNOSIS — H3530 Unspecified macular degeneration: Secondary | ICD-10-CM | POA: Diagnosis not present

## 2017-04-19 DIAGNOSIS — H25812 Combined forms of age-related cataract, left eye: Secondary | ICD-10-CM | POA: Diagnosis not present

## 2017-04-19 DIAGNOSIS — H268 Other specified cataract: Secondary | ICD-10-CM | POA: Diagnosis not present

## 2017-04-19 HISTORY — PX: CATARACT EXTRACTION: SUR2

## 2017-04-28 ENCOUNTER — Ambulatory Visit (INDEPENDENT_AMBULATORY_CARE_PROVIDER_SITE_OTHER): Payer: Medicare Other | Admitting: Family Medicine

## 2017-04-28 ENCOUNTER — Encounter: Payer: Self-pay | Admitting: Family Medicine

## 2017-04-28 VITALS — BP 108/60 | HR 85 | Ht 65.0 in | Wt 149.0 lb

## 2017-04-28 DIAGNOSIS — Z23 Encounter for immunization: Secondary | ICD-10-CM

## 2017-04-28 DIAGNOSIS — I10 Essential (primary) hypertension: Secondary | ICD-10-CM

## 2017-04-28 DIAGNOSIS — I482 Chronic atrial fibrillation, unspecified: Secondary | ICD-10-CM

## 2017-04-28 DIAGNOSIS — F411 Generalized anxiety disorder: Secondary | ICD-10-CM

## 2017-04-28 DIAGNOSIS — E785 Hyperlipidemia, unspecified: Secondary | ICD-10-CM | POA: Diagnosis not present

## 2017-04-28 DIAGNOSIS — Z0000 Encounter for general adult medical examination without abnormal findings: Secondary | ICD-10-CM

## 2017-04-28 DIAGNOSIS — M810 Age-related osteoporosis without current pathological fracture: Secondary | ICD-10-CM | POA: Diagnosis not present

## 2017-04-28 LAB — LIPID PANEL
CHOL/HDL RATIO: 3
Cholesterol: 143 mg/dL (ref 0–200)
HDL: 53 mg/dL (ref >39.00)
LDL Cholesterol: 70 mg/dL (ref 0–99)
NONHDL: 90.37
TRIGLYCERIDES: 104 mg/dL (ref 0.0–149.0)
VLDL: 20.8 mg/dL (ref 0.0–40.0)

## 2017-04-28 LAB — COMPREHENSIVE METABOLIC PANEL
ALK PHOS: 61 U/L (ref 39–117)
ALT: 16 U/L (ref 0–35)
AST: 23 U/L (ref 0–37)
Albumin: 4 g/dL (ref 3.5–5.2)
BILIRUBIN TOTAL: 0.7 mg/dL (ref 0.2–1.2)
BUN: 15 mg/dL (ref 6–23)
CALCIUM: 9.2 mg/dL (ref 8.4–10.5)
CO2: 33 mEq/L — ABNORMAL HIGH (ref 19–32)
Chloride: 102 mEq/L (ref 96–112)
Creatinine, Ser: 0.77 mg/dL (ref 0.40–1.20)
GFR: 75.34 mL/min (ref >60.00)
Glucose, Bld: 91 mg/dL (ref 70–99)
Potassium: 3.8 mEq/L (ref 3.5–5.1)
Sodium: 141 mEq/L (ref 135–145)
TOTAL PROTEIN: 6.4 g/dL (ref 6.0–8.3)

## 2017-04-28 LAB — CBC
HCT: 43.2 % (ref 36.0–46.0)
HEMOGLOBIN: 14.3 g/dL (ref 12.0–15.0)
MCHC: 33.2 g/dL (ref 30.0–36.0)
MCV: 95.9 fl (ref 78.0–100.0)
PLATELETS: 213 10*3/uL (ref 150.0–400.0)
RBC: 4.5 Mil/uL (ref 3.87–5.11)
RDW: 13.6 % (ref 11.5–15.5)
WBC: 5.3 10*3/uL (ref 4.0–10.5)

## 2017-04-28 MED ORDER — SIMVASTATIN 20 MG PO TABS: 20.0000 mg | ORAL_TABLET | Freq: Every day | ORAL | 11 refills | Status: DC

## 2017-04-28 MED ORDER — ALPRAZOLAM 0.5 MG PO TABS: 0.2500 mg | ORAL_TABLET | Freq: Two times a day (BID) | ORAL | 0 refills | Status: DC | PRN

## 2017-04-28 NOTE — Assessment & Plan Note (Signed)
S: controlled on metoprolol 25mg  and diltiazem (primarily for HR but also controlling BP).  BP Readings from Last 3 Encounters:  04/28/17 108/60  03/29/17 140/80  10/26/16 126/78  A/P: We discussed blood pressure goal of <140/90. Continue current meds

## 2017-04-28 NOTE — Assessment & Plan Note (Signed)
S: declines repeat bone density tests as would not take medication. Did not tolerate fosamax. Would decline other options including injectables. Does take calcium and vitamin D A/P: continue calcium and vitamin D. We discussed risks.

## 2017-04-28 NOTE — Assessment & Plan Note (Signed)
S: xanax 1/2 tablet to full tablet rarely in past . States since last fill in 2015 has finally run out of medicine.  Still has issues with anxiety before meals sparingly and xanax really helps. Prior was using about once every 1-2 months.   Patient with cataract surgery a week ago on her left eye. This is making her anxious- sees great out of left eye but the mismatch bothers her.  A/P: did refill xanax #20 for very sparing use.

## 2017-04-28 NOTE — Assessment & Plan Note (Signed)
S: well controlled on simvastatin 20mg . No myalgias.   A/P: update lipids today, refilled simvastatin

## 2017-04-28 NOTE — Progress Notes (Signed)
Subjective:   Lisa Douglas is a 83 y.o. female who presents for Medicare Annual (Subsequent) preventive examination.  The Patient was informed that the wellness visit is to identify future health risk and educate and initiate measures that can reduce risk for increased disease through the lifespan.    Annual Wellness Assessment  Reports health as fair due to recent cataract surgery on the left   Psychosocial Worked [employer]; retired at 41;  Lived with collage age student; "Lisa Douglas" Never married One sister 4 bedroom home and upstairs and downstairs Ready to scale back [name]- great niece living with her and she is getting married  Wants to move and sell her home    Preventive Screening -Counseling & Management  Medicare Annual Preventive Care Visit - Subsequent Last OV today  Meds; Has not had bp meds today   Osteoporosis - Dexa 05/2010;  Quit having bone densities  States she took some vit; calcium  Colonoscopy back in 2000  Mammogram 5.2014 -Breast cancer in sister  Stopped mammogram   IMM due Discuss shingrix; (zoster in 2013)  Flu vaccine - will take today    VS reviewed;   Diet  Breakfast; cereal  Lunch; sometimes; eats fast food;  Or makes sandwiches or goes to get something Cooks on occasion Fixed chicken; frozen roasted potatoes; green beans and corn Usually has chic filet or [employer]  Has a plan to cook when niece leaves Mac and cheese and chili  Can describe how she makes mac and cheese   BMI 24.8  Exercise Mostly walks; used to walk a lot  Walks every day to [employer] and back  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 Manuella Ghazi, [facility] ophthalmology; last seen 11/2016 Just had cataract surgery on the left eye  Right eye is her good eye  Declines hearing screen     Stressors: denies depression but does feel stressed Due to her eye;  difficulty walking but did drive today  Left eye sight is different from right   Sleep patterns:   sleep well; appetite is good  Pain-C/o of OA in left knee      Cardiac Risk Factors Addressed Hyperlipidemia - chol/ hdl 3; hdl 53; trig 136; LDL 65  Diabetes -neg   Advanced Directives states she does have this  Her great niece HCPOA   Patient Care Team: Marin Olp, MD as PCP - General (Family Medicine) Bensimhon, Shaune Pascal, MD as Consulting Physician (Cardiology) Assessed for additional providers  Immunization History  Administered Date(s) Administered  . Influenza Split 05/26/2011, 06/02/2012  . Influenza Whole 05/15/2008, 05/21/2009, 05/27/2010  . Influenza, High Dose Seasonal PF 04/27/2016, 04/28/2017  . Influenza,inj,Quad PF,6+ Mos 05/09/2013, 05/23/2014, 04/29/2015  . Pneumococcal Conjugate-13 10/29/2014  . Pneumococcal Polysaccharide-23 11/03/2012  . Td 03/31/2010  . Zoster 09/24/2011   Required Immunizations needed today  Screening test up to date or reviewed for plan of completion Health Maintenance Due  Topic Date Due  . INFLUENZA VACCINE  03/23/2017    Cardiac Risk Factors include: advanced age (>84mn, >>29women);dyslipidemia;family history of premature cardiovascular disease;hypertension    Objective:     Vitals: BP 108/60   Pulse 85   Ht _0  (1.651 m)   Wt 149 lb (67.6 kg)   SpO2 98%   BMI 24.79 kg/m   Body mass index is 24.79  kg/m.   Tobacco History  Smoking Status  . Former Smoker  . Packs/day: 0.25  . Years: 2.00  . Types: Cigarettes  . Quit date: 08/24/1951  Smokeless Tobacco  . Never Used    Comment: smoked at age 68 for a short time--a few months     Counseling given: Yes   Past Medical History:  Diagnosis Date  . 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 Not current problem.    . Angiodysplasia of intestine (without mention of hemorrhage)   . Anxiety state,  unspecified   . Atrial fibrillation ([place])   . Diverticulosis of colon (without mention of hemorrhage)   . Diverticulosis of colon with hemorrhage 06/12/2014  . Esophageal reflux   . Macular degeneration (senile) of retina, unspecified   . 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 Osteoarthrosis, unspecified whether generalized or localized, unspecified site   . Osteoporosis, unspecified   . Other and unspecified hyperlipidemia   . Unspecified venous (peripheral) insufficiency    Past Surgical History:  Procedure Laterality Date  . APPENDECTOMY  1951  . CATARACT EXTRACTION Left 04/19/2017  . LAPAROTOMY  03/07/2012   Procedure: EXPLORATORY LAPAROTOMY;  Surgeon: Angelia Mould, MD;  Location: [facility] OR;  Service: Vascular;  Laterality: N/A;  Exploratory Laparotomy with Evacuation of Hematoma.  . TONSILLECTOMY  1943   Family History  Problem Relation Age of Onset  . Hemochromatosis Father        tested in [place], denies history  . Heart disease Sister   . Breast cancer Sister        died 46, not sure when she had it  . Lymphoma Sister    History  Sexual Activity  . Sexual activity: Not on file    Outpatient Encounter Prescriptions as of 04/28/2017  Medication Sig  . CARTIA XT 180 MG 24 hr capsule TAKE ONE CAPSULE BY MOUTH ONCE DAILY  . Cholecalciferol (VITAMIN D) 2000 UNITS CAPS Take 1 capsule by mouth daily.  . metoprolol succinate (TOPROL-XL) 25 MG 24 hr tablet TAKE ONE TABLET BY MOUTH ONCE DAILY  . Multiple Vitamin (MULTIVITAMIN WITH MINERALS) TABS Take 1 tablet by mouth daily.  . multivitamin-lutein (OCUVITE-LUTEIN) CAPS Take 1 capsule by mouth daily.  . simvastatin (ZOCOR) 20 MG tablet TAKE ONE TABLET BY MOUTH AT BEDTIME  . warfarin (COUMADIN) 3 MG tablet TAKE AS DIRECTED BY COUMADIN CLINIC   No facility-administered encounter medications on file as of 04/28/2017.     Activities of Daily Living In your present state of health, do you have any difficulty performing the following  activities: 04/28/2017  Hearing? Y  Comment declines hearing screen   Vision? Y  Comment having cataract   Difficulty concentrating or making decisions? N  Walking or climbing stairs? N  Dressing or bathing? N  Doing errands, shopping? N  Preparing Food and eating ? N  Using the Toilet? N  In the past six months, have you accidently leaked urine? N  Do you have problems with loss of bowel control? N  Managing your Medications? N  Managing your Finances? N  Housekeeping or managing your Housekeeping? N  Some recent data might be hidden    Patient Care Team: Marin Olp, MD as PCP - General (Family Medicine) Bensimhon, Shaune Pascal, MD as Consulting Physician (Cardiology)    Assessment:     Exercise Activities and Dietary recommendations Current Exercise Habits: Home exercise routine, Type of exercise: walking,  Time (Minutes): 30, Frequency (Times/Week): 5, Weekly Exercise (Minutes/Week): 150, Intensity: Mild  Goals    . patient          Plans to move to smaller area. Will start checking out the community to see where you desire to live       [place]  04/28/2017 05/31/2016 05/28/2016 04/27/2016 04/20/2016  Falls in the past year? _0   Comment recovered from fall last year  - - - Emmi Telephone Survey: data to providers prior to load   Depression Screen PHQ 2/9 Scores 04/28/2017 05/31/2016 05/28/2016 04/27/2016  PHQ - 2 Score 0 0 0 0     Cognitive Function MMSE - Mini Mental State Exam 04/28/2017  Not completed: (No Data)     6CIT Screen 04/28/2017  What Year? 0 points  What month? 0 points  What time? 0 points  Count back from 20 0 points  Months in reverse 0 points  Repeat phrase 0 points  Total Score 0    Immunization History  Administered Date(s) Administered  . Influenza Split 05/26/2011, 06/02/2012  . Influenza Whole 05/15/2008, 05/21/2009, 05/27/2010  . Influenza, High Dose Seasonal PF 04/27/2016, 04/28/2017  . Influenza,inj,Quad PF,6+ Mos  05/09/2013, 05/23/2014, 04/29/2015  . Pneumococcal Conjugate-13 10/29/2014  . Pneumococcal Polysaccharide-23 11/03/2012  . Td 03/31/2010  . Zoster 09/24/2011   Screening Tests Health Maintenance  Topic Date Due  . INFLUENZA VACCINE  03/23/2017  . TETANUS/TDAP  03/31/2020  . DEXA SCAN  Completed  . PNA vac Low Risk Adult  Completed      Plan:     PCP Notes   Health Maintenance History of osteoporosis. Continues to take vitamin D. States she stopped having DEXA scans.  Has stopped having mammograms. Sister did have breast cancer when she was in her 15s.  Agreed to take her flu vaccine today. States she does not feel she needs another shingles shot. Educated regarding the shingrix. States she does not want to pay the co-pays, even at the pharmacy  Abnormal Screens  Hearing is not good but declines hearing screen. Does not want hearing aids. Had left cataract surgery 1 week ago. Vision somewhat impaired due to vision in the left eye being poor in the right eye being good  Referrals  No referrals today  Patient concerns; Patient complains of stress that when asked about stress states it is mostly due to her current on side. Places her somewhat at risk for falls but denies falls. States she wants Xanax. States she has used this before and it helps her sleep at night.  Also complains of pain in the left knee. States it does get better when she walks which she has started doing during the day.  Nurse Concerns; Patient very independent. States she does have family support to assist her with moved over the next year. Declines any resources on living options in the community.  Next PCP apt Increasing Dr. Yong Channel today.      I have personally reviewed and noted the following in the patient's chart:   . Medical and social history . Use of alcohol, tobacco or illicit drugs  . Current medications and supplements . Functional ability and status . Nutritional status . Physical  activity . Advanced directives . List of other physicians . Hospitalizations, surgeries, and ER visits in previous 12 months . Vitals . Screenings to include cognitive, depression, and falls . Referrals and appointments  In addition, I have reviewed and  discussed with patient certain preventive protocols, quality metrics, and best practice recommendations. A written personalized care plan for preventive services as well as general preventive health recommendations were provided to patient.     YOYOO,JZBFM, RN  04/28/2017  I have reviewed and agree with note, evaluation, plan. See my separate note.   Garret Reddish, MD

## 2017-04-28 NOTE — Assessment & Plan Note (Signed)
S: chronic a fib. Rate controlled on dilitaizem and metoprolol. On coumadin through cardiology clinic. INR goal tight at 2-2.5 tight due to balance of prior rectal bleed from angiodysplasia of coon balanced with SMA thrombosis and a fib.  A/P: remains in a fib but rate controlled- continue current meds

## 2017-04-28 NOTE — Addendum Note (Signed)
Addended by: Netta Neat D on: 04/28/2017 10:38 AM   Modules accepted: Orders

## 2017-04-28 NOTE — Patient Instructions (Addendum)
Lisa Douglas , Thank you for taking time to come for your Medicare Wellness Visit. I appreciate your ongoing commitment to your health goals. Please review the following plan we discussed and let me know if I can assist you in the future.   Diabetes and weight loss; [facility]  Phone: [phone]   [facility]; [phone] [facility] [place]; [phone] Get resource to get information on any and all community programs for Seniors  [place]: [phone] Community Health Response Program -[phone] Public Health Dept; Need to be a skilled visit but can assist with bathing as well; [phone]   Deaf & Hard of Hearing Division Services - can assist with hearing aid x 1  No reviews  [address] Office  [place] [phone]  [phone]   Can have a hearing screen but declines at present   Shingrix is a vaccine for the prevention of Shingles in Adults 50 and older.  If you are on Medicare, you can request a prescription from your doctor to be filled at a pharmacy.  Please check with your benefits regarding applicable copays or out of pocket expenses.  The Shingrix is given in 2 vaccines approx 8 weeks apart. You must receive the 2nd dose prior to 6 months from receipt of the first.    Will take the flu vaccine today      These are the goals we discussed: Goals    . patient          Plans to move to smaller area. Will start checking out the community to see where you desire to live        This is a list of the screening recommended for you and due dates:  Health Maintenance  Topic Date Due  . Flu Shot  03/23/2017  . Tetanus Vaccine  03/31/2020  . DEXA scan (bone density measurement)  Completed  . Pneumonia vaccines  Completed   Prevention of falls: Remove rugs or any tripping hazards in the home Use Non slip mats in bathtubs and showers Placing grab bars next to the toilet and or shower Placing handrails on both  sides of the stair way Adding extra lighting in the home.   Personal safety issues reviewed:  1. Consider starting a community watch program per [facility] 2.  Changes batteries is smoke detector and/or carbon monoxide detector  3.  If you have firearms; keep them in a safe place 4.  Wear protection when in the sun; Always wear sunscreen or a hat; It is good to have your doctor check your skin annually or review any new areas of concern 5. Driving safety; Keep in the right lane; stay 3 car lengths behind the car in front of you on the highway; look 3 times prior to pulling out; carry your cell phone everywhere you go!    Learn about the Yellow Dot program:  The program allows first responders at your emergency to have access to who your physician is, as well as your medications and medical conditions.  Citizens requesting the Yellow Dot Packages should contact Master Corporal Nunzio Cobbs at the [facility] [phone] for the first week of the program and beginning the week after Easter citizens should contact their Scientist, physiological.  Fall Prevention in the Home Falls can cause injuries and can affect people from all age groups. There are many simple things that you can do to make your home safe  and to help prevent falls. What can I do on the outside of my home?  Regularly repair the edges of walkways and driveways and fix any cracks.  Remove high doorway thresholds.  Trim any shrubbery on the main path into your home.  Use bright outdoor lighting.  Clear walkways of debris and clutter, including tools and rocks.  Regularly check that handrails are securely fastened and in good repair. Both sides of any steps should have handrails.  Install guardrails along the edges of any raised decks or porches.  Have leaves, snow, and ice cleared regularly.  Use sand or salt on walkways during winter months.  In the garage, clean up any spills  right away, including grease or oil spills. What can I do in the bathroom?  Use night lights.  Install grab bars by the toilet and in the tub and shower. Do not use towel bars as grab bars.  Use non-skid mats or decals on the floor of the tub or shower.  If you need to sit down while you are in the shower, use a plastic, non-slip stool.  Keep the floor dry. Immediately clean up any water that spills on the floor.  Remove soap buildup in the tub or shower on a regular basis.  Attach bath mats securely with double-sided non-slip rug tape.  Remove throw rugs and other tripping hazards from the floor. What can I do in the bedroom?  Use night lights.  Make sure that a bedside light is easy to reach.  Do not use oversized bedding that drapes onto the floor.  Have a firm chair that has side arms to use for getting dressed.  Remove throw rugs and other tripping hazards from the floor. What can I do in the kitchen?  Clean up any spills right away.  Avoid walking on wet floors.  Place frequently used items in easy-to-reach places.  If you need to reach for something above you, use a sturdy step stool that has a grab bar.  Keep electrical cables out of the way.  Do not use floor polish or wax that makes floors slippery. If you have to use wax, make sure that it is non-skid floor wax.  Remove throw rugs and other tripping hazards from the floor. What can I do in the stairways?  Do not leave any items on the stairs.  Make sure that there are handrails on both sides of the stairs. Fix handrails that are broken or loose. Make sure that handrails are as long as the stairways.  Check any carpeting to make sure that it is firmly attached to the stairs. Fix any carpet that is loose or worn.  Avoid having throw rugs at the top or bottom of stairways, or secure the rugs with carpet tape to prevent them from moving.  Make sure that you have a light switch at the top of the stairs and  the bottom of the stairs. If you do not have them, have them installed. What are some other fall prevention tips?  Wear closed-toe shoes that fit well and support your feet. Wear shoes that have rubber soles or low heels.  When you use a stepladder, make sure that it is completely opened and that the sides are firmly locked. Have someone hold the ladder while you are using it. Do not climb a closed stepladder.  Add color or contrast paint or tape to grab bars and handrails in your home. Place contrasting color strips on the first  and last steps.  Use mobility aids as needed, such as canes, walkers, scooters, and crutches.  Turn on lights if it is dark. Replace any light bulbs that burn out.  Set up furniture so that there are clear paths. Keep the furniture in the same spot.  Fix any uneven floor surfaces.  Choose a carpet design that does not hide the edge of steps of a stairway.  Be aware of any and all pets.  Review your medicines with your healthcare provider. Some medicines can cause dizziness or changes in blood pressure, which increase your risk of falling. Talk with your health care provider about other ways that you can decrease your risk of falls. This may include working with a physical therapist or trainer to improve your strength, balance, and endurance. This information is not intended to replace advice given to you by your health care provider. Make sure you discuss any questions you have with your health care provider. Document Released: 07/30/2002 Document Revised: 01/06/2016 Document Reviewed: 09/13/2014 Elsevier Interactive Patient Education  2017 Outlook Maintenance for Postmenopausal Women Menopause is a normal process in which your reproductive ability comes to an end. This process happens gradually over a span of months to years, usually between the ages of 40 and 30. Menopause is complete when you have missed 12 consecutive menstrual periods. It  is important to talk with your health care provider about some of the most common conditions that affect postmenopausal women, such as heart disease, cancer, and bone loss (osteoporosis). Adopting a healthy lifestyle and getting preventive care can help to promote your health and wellness. Those actions can also lower your chances of developing some of these common conditions. What should I know about menopause? During menopause, you may experience a number of symptoms, such as:  Moderate-to-severe hot flashes.  Night sweats.  Decrease in sex drive.  Mood swings.  Headaches.  Tiredness.  Irritability.  Memory problems.  Insomnia.  Choosing to treat or not to treat menopausal changes is an individual decision that you make with your health care provider. What should I know about hormone replacement therapy and supplements? Hormone therapy products are effective for treating symptoms that are associated with menopause, such as hot flashes and night sweats. Hormone replacement carries certain risks, especially as you become older. If you are thinking about using estrogen or estrogen with progestin treatments, discuss the benefits and risks with your health care provider. What should I know about heart disease and stroke? Heart disease, heart attack, and stroke become more likely as you age. This may be due, in part, to the hormonal changes that your body experiences during menopause. These can affect how your body processes dietary fats, triglycerides, and cholesterol. Heart attack and stroke are both medical emergencies. There are many things that you can do to help prevent heart disease and stroke:  Have your blood pressure checked at least every 1-2 years. High blood pressure causes heart disease and increases the risk of stroke.  If you are 43-77 years old, ask your health care provider if you should take aspirin to prevent a heart attack or a stroke.  Do not use any tobacco  products, including cigarettes, chewing tobacco, or electronic cigarettes. If you need help quitting, ask your health care provider.  It is important to eat a healthy diet and maintain a healthy weight. ? Be sure to include plenty of vegetables, fruits, low-fat dairy products, and lean protein. ? Avoid eating foods that  are high in solid fats, added sugars, or salt (sodium).  Get regular exercise. This is one of the most important things that you can do for your health. ? Try to exercise for at least 150 minutes each week. The type of exercise that you do should increase your heart rate and make you sweat. This is known as moderate-intensity exercise. ? Try to do strengthening exercises at least twice each week. Do these in addition to the moderate-intensity exercise.  Know your numbers.Ask your health care provider to check your cholesterol and your blood glucose. Continue to have your blood tested as directed by your health care provider.  What should I know about cancer screening? There are several types of cancer. Take the following steps to reduce your risk and to catch any cancer development as early as possible. Breast Cancer  Practice breast self-awareness. ? This means understanding how your breasts normally appear and feel. ? It also means doing regular breast self-exams. Let your health care provider know about any changes, no matter how small.  If you are 46 or older, have a clinician do a breast exam (clinical breast exam or CBE) every year. Depending on your age, family history, and medical history, it may be recommended that you also have a yearly breast X-ray (mammogram).  If you have a family history of breast cancer, talk with your health care provider about genetic screening.  If you are at high risk for breast cancer, talk with your health care provider about having an MRI and a mammogram every year.  Breast cancer (BRCA) gene test is recommended for women who have  family members with BRCA-related cancers. Results of the assessment will determine the need for genetic counseling and BRCA1 and for BRCA2 testing. BRCA-related cancers include these types: ? Breast. This occurs in males or females. ? Ovarian. ? Tubal. This may also be called fallopian tube cancer. ? Cancer of the abdominal or pelvic lining (peritoneal cancer). ? Prostate. ? Pancreatic.  Cervical, Uterine, and Ovarian Cancer Your health care provider may recommend that you be screened regularly for cancer of the pelvic organs. These include your ovaries, uterus, and vagina. This screening involves a pelvic exam, which includes checking for microscopic changes to the surface of your cervix (Pap test).  For women ages 21-65, health care providers may recommend a pelvic exam and a Pap test every three years. For women ages 23-65, they may recommend the Pap test and pelvic exam, combined with testing for human papilloma virus (HPV), every five years. Some types of HPV increase your risk of cervical cancer. Testing for HPV may also be done on women of any age who have unclear Pap test results.  Other health care providers may not recommend any screening for nonpregnant women who are considered low risk for pelvic cancer and have no symptoms. Ask your health care provider if a screening pelvic exam is right for you.  If you have had past treatment for cervical cancer or a condition that could lead to cancer, you need Pap tests and screening for cancer for at least 20 years after your treatment. If Pap tests have been discontinued for you, your risk factors (such as having a new sexual partner) need to be reassessed to determine if you should start having screenings again. Some women have medical problems that increase the chance of getting cervical cancer. In these cases, your health care provider may recommend that you have screening and Pap tests more often.  If  you have a family history of uterine  cancer or ovarian cancer, talk with your health care provider about genetic screening.  If you have vaginal bleeding after reaching menopause, tell your health care provider.  There are currently no reliable tests available to screen for ovarian cancer.  Lung Cancer Lung cancer screening is recommended for adults 44-4 years old who are at high risk for lung cancer because of a history of smoking. A yearly low-dose CT scan of the lungs is recommended if you:  Currently smoke.  Have a history of at least 30 pack-years of smoking and you currently smoke or have quit within the past 15 years. A pack-year is smoking an average of one pack of cigarettes per day for one year.  Yearly screening should:  Continue until it has been 15 years since you quit.  Stop if you develop a health problem that would prevent you from having lung cancer treatment.  Colorectal Cancer  This type of cancer can be detected and can often be prevented.  Routine colorectal cancer screening usually begins at age 8 and continues through age 58.  If you have risk factors for colon cancer, your health care provider may recommend that you be screened at an earlier age.  If you have a family history of colorectal cancer, talk with your health care provider about genetic screening.  Your health care provider may also recommend using home test kits to check for hidden blood in your stool.  A small camera at the end of a tube can be used to examine your colon directly (sigmoidoscopy or colonoscopy). This is done to check for the earliest forms of colorectal cancer.  Direct examination of the colon should be repeated every 5-10 years until age 66. However, if early forms of precancerous polyps or small growths are found or if you have a family history or genetic risk for colorectal cancer, you may need to be screened more often.  Skin Cancer  Check your skin from head to toe regularly.  Monitor any moles. Be sure to  tell your health care provider: ? About any new moles or changes in moles, especially if there is a change in a mole's shape or color. ? If you have a mole that is larger than the size of a pencil eraser.  If any of your family members has a history of skin cancer, especially at a young age, talk with your health care provider about genetic screening.  Always use sunscreen. Apply sunscreen liberally and repeatedly throughout the day.  Whenever you are outside, protect yourself by wearing long sleeves, pants, a wide-brimmed hat, and sunglasses.  What should I know about osteoporosis? Osteoporosis is a condition in which bone destruction happens more quickly than new bone creation. After menopause, you may be at an increased risk for osteoporosis. To help prevent osteoporosis or the bone fractures that can happen because of osteoporosis, the following is recommended:  If you are 16-36 years old, get at least 1,000 mg of calcium and at least 600 mg of vitamin D per day.  If you are older than age 70 but younger than age 3, get at least 1,200 mg of calcium and at least 600 mg of vitamin D per day.  If you are older than age 38, get at least 1,200 mg of calcium and at least 800 mg of vitamin D per day.  Smoking and excessive alcohol intake increase the risk of osteoporosis. Eat foods that are rich in  calcium and vitamin D, and do weight-bearing exercises several times each week as directed by your health care provider. What should I know about how menopause affects my mental health? Depression may occur at any age, but it is more common as you become older. Common symptoms of depression include:  Low or sad mood.  Changes in sleep patterns.  Changes in appetite or eating patterns.  Feeling an overall lack of motivation or enjoyment of activities that you previously enjoyed.  Frequent crying spells.  Talk with your health care provider if you think that you are experiencing  depression. What should I know about immunizations? It is important that you get and maintain your immunizations. These include:  Tetanus, diphtheria, and pertussis (Tdap) booster vaccine.  Influenza every year before the flu season begins.  Pneumonia vaccine.  Shingles vaccine.  Your health care provider may also recommend other immunizations. This information is not intended to replace advice given to you by your health care provider. Make sure you discuss any questions you have with your health care provider. Document Released: 10/01/2005 Document Revised: 02/27/2016 Document Reviewed: 05/13/2015 Elsevier Interactive Patient Education  2018 [employer].

## 2017-04-28 NOTE — Progress Notes (Signed)
Subjective:  Lisa Douglas is a 83 y.o. year old very pleasant female patient who presents for/with See problem oriented charting ROS- no fever or chills. Mild cough. No chest pain. No increased shortness of breath.    Past Medical History-  Patient Active Problem List   Diagnosis Date Noted  . ATRIAL FIBRILLATION, CHRONIC 10/03/2007    Priority: High  . Essential hypertension 04/27/2016    Priority: Medium  . 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 Anxiety state 10/03/2007    Priority: Medium  . Osteoporosis 10/03/2007    Priority: Medium  . Hyperlipidemia 10/02/2007    Priority: Medium  . Angiodysplasia of colon 06/01/2004    Priority: Medium  . Epigastric hernia 12/23/2014    Priority: Low  . Diverticulitis of colon 06/12/2014    Priority: Low  . CKD (chronic kidney disease), stage II 03/26/2014    Priority: Low  . Long term current use of anticoagulant therapy 03/20/2012    Priority: Low  . Leg cramps 12/30/2011    Priority: Low  . Vitamin D deficiency 04/01/2011    Priority: Low  . Advanced nonexudative age-related macular degeneration of both eyes without subfoveal involvement 10/03/2007    Priority: Low  . VENOUS INSUFFICIENCY 10/03/2007    Priority: Low  . GERD 10/03/2007    Priority: Low  . DEGENERATIVE JOINT DISEASE 10/03/2007    Priority: Low  . Osteoarthritis of left knee 10/26/2016  . Posterior vitreous detachment, bilateral 05/25/2016  . Nuclear cataract of both eyes 11/08/2014  . Macular hole 08/31/2011    Medications- reviewed and updated Current Outpatient Prescriptions  Medication Sig Dispense Refill  . CARTIA XT 180 MG 24 hr capsule TAKE ONE CAPSULE BY MOUTH ONCE DAILY 30 capsule 11  . Cholecalciferol (VITAMIN D) 2000 UNITS CAPS Take 1 capsule by mouth daily.    . metoprolol succinate (TOPROL-XL) 25 MG 24 hr tablet TAKE ONE TABLET BY MOUTH ONCE DAILY 30 tablet 11  . Multiple Vitamin (MULTIVITAMIN WITH  MINERALS) TABS Take 1 tablet by mouth daily.    . multivitamin-lutein (OCUVITE-LUTEIN) CAPS Take 1 capsule by mouth daily.    . simvastatin (ZOCOR) 20 MG tablet Take 1 tablet (20 mg total) by mouth at bedtime. 30 tablet 11  . warfarin (COUMADIN) 3 MG tablet TAKE AS DIRECTED BY COUMADIN CLINIC 70 tablet 1  . ALPRAZolam (XANAX) 0.5 MG tablet Take 0.5 tablets (0.25 mg total) by mouth 2 (two) times daily as needed. For anxiety 20 tablet 0   No current facility-administered medications for this visit.     Objective: BP 108/60   Pulse 85   Ht 5\' 5"  (1.651 m)   Wt 149 lb (67.6 kg)   SpO2 98%   BMI 24.79 kg/m  Gen: NAD, resting comfortably, appears younger than stated age CV: irregularly irregular no murmurs rubs or gallops Lungs: CTAB no crackles, wheeze, rhonchi Abdomen: soft/nontender/nondistended/normal bowel sounds. Hernia unchanged in epigastric area. No rebound or guarding.  Ext: no edema Skin: warm, dry Neuro: grossly normal, moves all extremities, walks without difficulty  Assessment/Plan:  ATRIAL FIBRILLATION, CHRONIC S: chronic a fib. Rate controlled on dilitaizem and metoprolol. On coumadin through cardiology clinic. INR goal tight at 2-2.5 tight due to balance of prior rectal bleed from angiodysplasia of coon balanced with SMA thrombosis and a fib.  A/P: remains in a fib but rate controlled- continue current meds  Osteoporosis S: declines repeat bone density tests as would not take  medication. Did not tolerate fosamax. Would decline other options including injectables. Does take calcium and vitamin D A/P: continue calcium and vitamin D. We discussed risks.   Hyperlipidemia S: well controlled on simvastatin 20mg . No myalgias.   A/P: update lipids today, refilled simvastatin  Essential hypertension S: controlled on metoprolol 25mg  and diltiazem (primarily for HR but also controlling BP).  BP Readings from Last 3 Encounters:  04/28/17 108/60  03/29/17 140/80  10/26/16  126/78  A/P: We discussed blood pressure goal of <140/90. Continue current meds  Anxiety state S: xanax 1/2 tablet to full tablet rarely in past . States since last fill in 2015 has finally run out of medicine.  Still has issues with anxiety before meals sparingly and xanax really helps. Prior was using about once every 1-2 months.   Patient with cataract surgery a week ago on her left eye. This is making her anxious- sees great out of left eye but the mismatch bothers her.  A/P: did refill xanax #20 for very sparing use.   Future Appointments Date Time Provider [place]  05/19/2017 1:00 PM [facility]  03/29/2018 1:30 PM Burtis Junes, NP [facility]   No Follow-up on file.  Orders Placed This Encounter  Procedures  . Flu vaccine HIGH DOSE PF (Fluzone High dose)  . CBC    Standing Status:   Future    Standing Expiration Date:   04/28/2018  . Comprehensive metabolic panel    [place]    Standing Status:   Future    Standing Expiration Date:   04/28/2018  . Lipid panel    Standing Status:   Future    Standing Expiration Date:   04/28/2018    Meds ordered this encounter  Medications  . ALPRAZolam (XANAX) 0.5 MG tablet    Sig: Take 0.5 tablets (0.25 mg total) by mouth 2 (two) times daily as needed. For anxiety    Dispense:  20 tablet    Refill:  0  . simvastatin (ZOCOR) 20 MG tablet    Sig: Take 1 tablet (20 mg total) by mouth at bedtime.    Dispense:  30 tablet    Refill:  11    Return precautions advised.  Garret Reddish, MD

## 2017-05-17 DIAGNOSIS — H25811 Combined forms of age-related cataract, right eye: Secondary | ICD-10-CM | POA: Diagnosis not present

## 2017-05-17 DIAGNOSIS — H21561 Pupillary abnormality, right eye: Secondary | ICD-10-CM | POA: Diagnosis not present

## 2017-05-19 ENCOUNTER — Ambulatory Visit (INDEPENDENT_AMBULATORY_CARE_PROVIDER_SITE_OTHER): Payer: Medicare Other | Admitting: *Deleted

## 2017-05-19 DIAGNOSIS — Z7901 Long term (current) use of anticoagulants: Secondary | ICD-10-CM

## 2017-05-19 DIAGNOSIS — I482 Chronic atrial fibrillation, unspecified: Secondary | ICD-10-CM

## 2017-05-19 DIAGNOSIS — K55069 Acute infarction of intestine, part and extent unspecified: Secondary | ICD-10-CM

## 2017-05-19 LAB — POCT INR: INR: 1.9

## 2017-05-23 ENCOUNTER — Other Ambulatory Visit: Payer: Self-pay | Admitting: Cardiology

## 2017-06-08 ENCOUNTER — Ambulatory Visit (INDEPENDENT_AMBULATORY_CARE_PROVIDER_SITE_OTHER): Payer: Medicare Other | Admitting: *Deleted

## 2017-06-08 DIAGNOSIS — K55069 Acute infarction of intestine, part and extent unspecified: Secondary | ICD-10-CM

## 2017-06-08 DIAGNOSIS — Z5181 Encounter for therapeutic drug level monitoring: Secondary | ICD-10-CM | POA: Diagnosis not present

## 2017-06-08 DIAGNOSIS — I482 Chronic atrial fibrillation, unspecified: Secondary | ICD-10-CM

## 2017-06-08 DIAGNOSIS — Z7901 Long term (current) use of anticoagulants: Secondary | ICD-10-CM | POA: Diagnosis not present

## 2017-06-08 LAB — POCT INR: INR: 2.7

## 2017-07-04 ENCOUNTER — Ambulatory Visit (INDEPENDENT_AMBULATORY_CARE_PROVIDER_SITE_OTHER): Payer: Medicare Other | Admitting: Pharmacist

## 2017-07-04 DIAGNOSIS — I482 Chronic atrial fibrillation, unspecified: Secondary | ICD-10-CM

## 2017-07-04 DIAGNOSIS — Z7901 Long term (current) use of anticoagulants: Secondary | ICD-10-CM | POA: Diagnosis not present

## 2017-07-04 DIAGNOSIS — K55069 Acute infarction of intestine, part and extent unspecified: Secondary | ICD-10-CM

## 2017-07-04 LAB — POCT INR: INR: 3

## 2017-07-26 DIAGNOSIS — H353133 Nonexudative age-related macular degeneration, bilateral, advanced atrophic without subfoveal involvement: Secondary | ICD-10-CM | POA: Diagnosis not present

## 2017-07-26 DIAGNOSIS — Z961 Presence of intraocular lens: Secondary | ICD-10-CM | POA: Diagnosis not present

## 2017-07-26 DIAGNOSIS — H35342 Macular cyst, hole, or pseudohole, left eye: Secondary | ICD-10-CM | POA: Diagnosis not present

## 2017-07-26 DIAGNOSIS — H43813 Vitreous degeneration, bilateral: Secondary | ICD-10-CM | POA: Diagnosis not present

## 2017-07-27 ENCOUNTER — Ambulatory Visit (INDEPENDENT_AMBULATORY_CARE_PROVIDER_SITE_OTHER): Payer: Medicare Other | Admitting: *Deleted

## 2017-07-27 DIAGNOSIS — I482 Chronic atrial fibrillation, unspecified: Secondary | ICD-10-CM

## 2017-07-27 DIAGNOSIS — Z7901 Long term (current) use of anticoagulants: Secondary | ICD-10-CM

## 2017-07-27 DIAGNOSIS — K55069 Acute infarction of intestine, part and extent unspecified: Secondary | ICD-10-CM

## 2017-07-27 DIAGNOSIS — Z5181 Encounter for therapeutic drug level monitoring: Secondary | ICD-10-CM | POA: Diagnosis not present

## 2017-07-27 LAB — POCT INR: INR: 2.7

## 2017-07-27 NOTE — Patient Instructions (Addendum)
Eat a large serving of leafy green vegetable today. Then continue eating your 2 servings each week. Change your dose to 1/2 tablet everyday.  Recheck INR in 2 weeks. [facility] # [phone].

## 2017-08-10 ENCOUNTER — Ambulatory Visit (INDEPENDENT_AMBULATORY_CARE_PROVIDER_SITE_OTHER): Payer: Medicare Other

## 2017-08-10 DIAGNOSIS — I482 Chronic atrial fibrillation, unspecified: Secondary | ICD-10-CM

## 2017-08-10 DIAGNOSIS — Z7901 Long term (current) use of anticoagulants: Secondary | ICD-10-CM | POA: Diagnosis not present

## 2017-08-10 DIAGNOSIS — K55069 Acute infarction of intestine, part and extent unspecified: Secondary | ICD-10-CM | POA: Diagnosis not present

## 2017-08-10 LAB — POCT INR: INR: 2.2

## 2017-08-10 NOTE — Patient Instructions (Signed)
Continue eating your 2 servings each week. Continue on same dosage 1/2 tablet everyday.  Recheck INR in 3 weeks. [facility] # [phone].

## 2017-08-30 ENCOUNTER — Ambulatory Visit (INDEPENDENT_AMBULATORY_CARE_PROVIDER_SITE_OTHER): Payer: Medicare Other | Admitting: *Deleted

## 2017-08-30 DIAGNOSIS — K55069 Acute infarction of intestine, part and extent unspecified: Secondary | ICD-10-CM

## 2017-08-30 DIAGNOSIS — Z7901 Long term (current) use of anticoagulants: Secondary | ICD-10-CM

## 2017-08-30 DIAGNOSIS — I482 Chronic atrial fibrillation, unspecified: Secondary | ICD-10-CM

## 2017-08-30 LAB — POCT INR: INR: 2

## 2017-08-30 NOTE — Patient Instructions (Signed)
Description   Continue on same dosage 1/2 tablet everyday. Continue eating your 2 servings each week. Recheck INR in 4 weeks. [facility] # [phone].

## 2017-09-27 ENCOUNTER — Ambulatory Visit (INDEPENDENT_AMBULATORY_CARE_PROVIDER_SITE_OTHER): Payer: Medicare Other

## 2017-09-27 DIAGNOSIS — K55069 Acute infarction of intestine, part and extent unspecified: Secondary | ICD-10-CM | POA: Diagnosis not present

## 2017-09-27 DIAGNOSIS — I482 Chronic atrial fibrillation, unspecified: Secondary | ICD-10-CM

## 2017-09-27 DIAGNOSIS — Z7901 Long term (current) use of anticoagulants: Secondary | ICD-10-CM

## 2017-09-27 LAB — POCT INR: INR: 1.6

## 2017-09-27 NOTE — Patient Instructions (Signed)
Description   Take 1 tablet today, then resume same dosage 1/2 tablet everyday. Continue eating your 2 servings each week. Recheck INR in 2 weeks. [facility] # [phone].

## 2017-10-11 ENCOUNTER — Ambulatory Visit (INDEPENDENT_AMBULATORY_CARE_PROVIDER_SITE_OTHER): Payer: Medicare Other | Admitting: *Deleted

## 2017-10-11 DIAGNOSIS — Z7901 Long term (current) use of anticoagulants: Secondary | ICD-10-CM

## 2017-10-11 DIAGNOSIS — I482 Chronic atrial fibrillation, unspecified: Secondary | ICD-10-CM

## 2017-10-11 DIAGNOSIS — Z5181 Encounter for therapeutic drug level monitoring: Secondary | ICD-10-CM | POA: Diagnosis not present

## 2017-10-11 DIAGNOSIS — K55069 Acute infarction of intestine, part and extent unspecified: Secondary | ICD-10-CM

## 2017-10-11 LAB — POCT INR: INR: 1.9

## 2017-10-11 NOTE — Patient Instructions (Signed)
Description   Today Feb 19th start taking  1/2 tablet everyday except 1 tablet on Tuesdays . Continue eating your 2 servings each week. Recheck INR in 2 weeks. [facility] # [phone].

## 2017-10-25 ENCOUNTER — Ambulatory Visit (INDEPENDENT_AMBULATORY_CARE_PROVIDER_SITE_OTHER): Payer: Medicare Other | Admitting: *Deleted

## 2017-10-25 DIAGNOSIS — Z7901 Long term (current) use of anticoagulants: Secondary | ICD-10-CM

## 2017-10-25 DIAGNOSIS — K55069 Acute infarction of intestine, part and extent unspecified: Secondary | ICD-10-CM | POA: Diagnosis not present

## 2017-10-25 DIAGNOSIS — I482 Chronic atrial fibrillation, unspecified: Secondary | ICD-10-CM

## 2017-10-25 LAB — POCT INR: INR: 2

## 2017-10-25 NOTE — Patient Instructions (Signed)
Description   Continue taking 1/2 tablet everyday except 1 tablet on Tuesdays. Continue eating your 2 servings each week. Recheck INR in 3 weeks. [facility] # [phone].        

## 2017-11-15 ENCOUNTER — Ambulatory Visit (INDEPENDENT_AMBULATORY_CARE_PROVIDER_SITE_OTHER): Payer: Medicare Other | Admitting: *Deleted

## 2017-11-15 DIAGNOSIS — I482 Chronic atrial fibrillation, unspecified: Secondary | ICD-10-CM

## 2017-11-15 DIAGNOSIS — K55069 Acute infarction of intestine, part and extent unspecified: Secondary | ICD-10-CM | POA: Diagnosis not present

## 2017-11-15 DIAGNOSIS — Z7901 Long term (current) use of anticoagulants: Secondary | ICD-10-CM

## 2017-11-15 LAB — POCT INR: INR: 2.8

## 2017-11-15 NOTE — Patient Instructions (Signed)
Description   Today take 1/2 tablet, then Continue taking 1/2 tablet everyday except 1 tablet on Tuesdays . Continue eating your 2 servings each week. Recheck INR in 3 weeks. [facility] # [phone].

## 2017-12-06 ENCOUNTER — Ambulatory Visit (INDEPENDENT_AMBULATORY_CARE_PROVIDER_SITE_OTHER): Payer: Medicare Other | Admitting: *Deleted

## 2017-12-06 DIAGNOSIS — I482 Chronic atrial fibrillation, unspecified: Secondary | ICD-10-CM

## 2017-12-06 DIAGNOSIS — Z7901 Long term (current) use of anticoagulants: Secondary | ICD-10-CM

## 2017-12-06 DIAGNOSIS — K55069 Acute infarction of intestine, part and extent unspecified: Secondary | ICD-10-CM | POA: Diagnosis not present

## 2017-12-06 LAB — POCT INR: INR: 2.2

## 2017-12-06 NOTE — Patient Instructions (Addendum)
Description   Continue taking 1/2 tablet every day except 1 tablet on Tuesdays. Continue eating your 2 servings each week. Recheck INR in 4 weeks. [facility] # [phone].        

## 2017-12-20 ENCOUNTER — Other Ambulatory Visit: Payer: Self-pay | Admitting: Cardiology

## 2017-12-22 ENCOUNTER — Other Ambulatory Visit: Payer: Self-pay | Admitting: *Deleted

## 2017-12-22 MED ORDER — WARFARIN SODIUM 3 MG PO TABS: ORAL_TABLET | ORAL | 1 refills | Status: DC

## 2018-01-02 ENCOUNTER — Ambulatory Visit (INDEPENDENT_AMBULATORY_CARE_PROVIDER_SITE_OTHER): Payer: Medicare Other | Admitting: Pharmacist

## 2018-01-02 DIAGNOSIS — K55069 Acute infarction of intestine, part and extent unspecified: Secondary | ICD-10-CM | POA: Diagnosis not present

## 2018-01-02 DIAGNOSIS — I4891 Unspecified atrial fibrillation: Secondary | ICD-10-CM

## 2018-01-02 DIAGNOSIS — Z7901 Long term (current) use of anticoagulants: Secondary | ICD-10-CM | POA: Diagnosis not present

## 2018-01-02 LAB — POCT INR: INR: 2.4

## 2018-01-02 NOTE — Patient Instructions (Signed)
Description   Continue taking 1/2 tablet everyday except 1 tablet on Tuesdays. Continue eating your 2 servings each week. Recheck INR in 5 weeks. [facility] # [phone].        

## 2018-01-13 DIAGNOSIS — L820 Inflamed seborrheic keratosis: Secondary | ICD-10-CM | POA: Diagnosis not present

## 2018-01-13 DIAGNOSIS — L812 Freckles: Secondary | ICD-10-CM | POA: Diagnosis not present

## 2018-01-13 DIAGNOSIS — L400 Psoriasis vulgaris: Secondary | ICD-10-CM | POA: Diagnosis not present

## 2018-01-13 DIAGNOSIS — Z85820 Personal history of malignant melanoma of skin: Secondary | ICD-10-CM | POA: Diagnosis not present

## 2018-01-13 DIAGNOSIS — L821 Other seborrheic keratosis: Secondary | ICD-10-CM | POA: Diagnosis not present

## 2018-01-13 DIAGNOSIS — D1801 Hemangioma of skin and subcutaneous tissue: Secondary | ICD-10-CM | POA: Diagnosis not present

## 2018-01-26 DIAGNOSIS — H35342 Macular cyst, hole, or pseudohole, left eye: Secondary | ICD-10-CM | POA: Diagnosis not present

## 2018-01-26 DIAGNOSIS — H353133 Nonexudative age-related macular degeneration, bilateral, advanced atrophic without subfoveal involvement: Secondary | ICD-10-CM | POA: Diagnosis not present

## 2018-01-26 DIAGNOSIS — Z961 Presence of intraocular lens: Secondary | ICD-10-CM | POA: Insufficient documentation

## 2018-01-26 DIAGNOSIS — H04123 Dry eye syndrome of bilateral lacrimal glands: Secondary | ICD-10-CM | POA: Diagnosis not present

## 2018-01-31 DIAGNOSIS — H43813 Vitreous degeneration, bilateral: Secondary | ICD-10-CM | POA: Diagnosis not present

## 2018-01-31 DIAGNOSIS — H353133 Nonexudative age-related macular degeneration, bilateral, advanced atrophic without subfoveal involvement: Secondary | ICD-10-CM | POA: Diagnosis not present

## 2018-01-31 DIAGNOSIS — H35342 Macular cyst, hole, or pseudohole, left eye: Secondary | ICD-10-CM | POA: Diagnosis not present

## 2018-01-31 DIAGNOSIS — Z961 Presence of intraocular lens: Secondary | ICD-10-CM | POA: Diagnosis not present

## 2018-02-07 ENCOUNTER — Ambulatory Visit (INDEPENDENT_AMBULATORY_CARE_PROVIDER_SITE_OTHER): Payer: Medicare Other | Admitting: *Deleted

## 2018-02-07 DIAGNOSIS — Z7901 Long term (current) use of anticoagulants: Secondary | ICD-10-CM

## 2018-02-07 DIAGNOSIS — I4891 Unspecified atrial fibrillation: Secondary | ICD-10-CM | POA: Diagnosis not present

## 2018-02-07 DIAGNOSIS — K55069 Acute infarction of intestine, part and extent unspecified: Secondary | ICD-10-CM

## 2018-02-07 LAB — POCT INR: INR: 2.3 (ref 2.0–3.0)

## 2018-02-07 NOTE — Patient Instructions (Signed)
Description   Continue taking 1/2 tablet everyday except 1 tablet on Tuesdays. Continue eating your 2 servings each week. Recheck INR in 5 weeks. [facility] # [phone].

## 2018-03-14 ENCOUNTER — Ambulatory Visit (INDEPENDENT_AMBULATORY_CARE_PROVIDER_SITE_OTHER): Payer: Medicare Other | Admitting: *Deleted

## 2018-03-14 DIAGNOSIS — Z7901 Long term (current) use of anticoagulants: Secondary | ICD-10-CM | POA: Diagnosis not present

## 2018-03-14 DIAGNOSIS — K55069 Acute infarction of intestine, part and extent unspecified: Secondary | ICD-10-CM | POA: Diagnosis not present

## 2018-03-14 DIAGNOSIS — I4891 Unspecified atrial fibrillation: Secondary | ICD-10-CM

## 2018-03-14 LAB — POCT INR: INR: 1.8 — AB (ref 2.0–3.0)

## 2018-03-14 NOTE — Patient Instructions (Signed)
Description   Today take 1.5 tablets, then Continue taking 1/2 tablet everyday except 1 tablet on Tuesdays. Continue eating your 2 servings each week. Recheck INR in 2 weeks. [facility] # [phone].

## 2018-03-29 ENCOUNTER — Encounter: Payer: Self-pay | Admitting: Nurse Practitioner

## 2018-03-29 ENCOUNTER — Ambulatory Visit (INDEPENDENT_AMBULATORY_CARE_PROVIDER_SITE_OTHER): Payer: Medicare Other | Admitting: Nurse Practitioner

## 2018-03-29 ENCOUNTER — Ambulatory Visit (INDEPENDENT_AMBULATORY_CARE_PROVIDER_SITE_OTHER): Payer: Medicare Other

## 2018-03-29 ENCOUNTER — Other Ambulatory Visit (INDEPENDENT_AMBULATORY_CARE_PROVIDER_SITE_OTHER): Payer: Medicare Other | Admitting: Nurse Practitioner

## 2018-03-29 VITALS — BP 130/68 | HR 78 | Ht 64.0 in | Wt 146.8 lb

## 2018-03-29 DIAGNOSIS — I482 Chronic atrial fibrillation, unspecified: Secondary | ICD-10-CM

## 2018-03-29 DIAGNOSIS — Z7901 Long term (current) use of anticoagulants: Secondary | ICD-10-CM | POA: Diagnosis not present

## 2018-03-29 DIAGNOSIS — K55069 Acute infarction of intestine, part and extent unspecified: Secondary | ICD-10-CM | POA: Diagnosis not present

## 2018-03-29 DIAGNOSIS — I4891 Unspecified atrial fibrillation: Secondary | ICD-10-CM

## 2018-03-29 LAB — POCT INR: INR: 2.4 (ref 2.0–3.0)

## 2018-03-29 NOTE — Patient Instructions (Addendum)
We will be checking the following labs today - BMEt & CBC   Medication Instructions:    Continue with your current medicines.     Testing/Procedures To Be Arranged:  N/A  Follow-Up:   See me in a year    Other Special Instructions:   N/A    If you need a refill on your cardiac medications before your next appointment, please call your pharmacy.   Call the [place] office at [phone] if you have any questions, problems or concerns.

## 2018-03-29 NOTE — Progress Notes (Signed)
CARDIOLOGY OFFICE NOTE  Date:  03/29/2018    Lisa Douglas Date of Birth: 04-Feb-1928 Medical Record #[id number]  PCP:  Marin Olp, MD  Cardiologist:  Servando Snare   Chief Complaint  Patient presents with  . Atrial Fibrillation    1 year check    History of Present Illness: Lisa Douglas is a 84 y.o. female who presents today for a one year check. Former patient of Dr. Claris Gladden. She primarily follows with me.   She has a history of chronic atrial fibrillation and prior embolic SMA occlusion requiring embolectomy (off coumadin at the time). She has a history of GI bleeding from gastric AVMs.   I saw her a year ago - was felt to be doing ok from our standpoint. Had had prneumonia and a broken arm due to a fall. Still pretty active however.   Comes in today. Here alone. She is doing ok. No real problems over the past year. She is cautious with walking. No falls. No bleeding. No chest pain. Breathing is ok. She is doing some knitting. Has had some vision issues but overall, doing ok. No real concerns today.   PMH: 1. Hyperlipidemia 2. Gastric AVMs with history of GI bleeding. 3. GERD 4. Atrial fibrillation: Chronic. She had suspected cardioembolic SMA occlusion in 7/13 requiring SMA embolectomy. Coumadin begun at this time.  5. Macular degeneration. 6. Diverticulosis. 7. Echo (7/13) with EF 60%, mild MR.   Past Medical History:  Diagnosis Date  . 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 Not current problem.    . Angiodysplasia of intestine (without mention of hemorrhage)   . Anxiety state, unspecified   . Atrial fibrillation ([place])   . Diverticulosis of colon (without mention of hemorrhage)   . Diverticulosis of colon with hemorrhage 06/12/2014  . Esophageal reflux   . Macular degeneration (senile) of retina, unspecified   . 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 Osteoarthrosis, unspecified whether generalized or  localized, unspecified site   . Osteoporosis, unspecified   . Other and unspecified hyperlipidemia   . Unspecified venous (peripheral) insufficiency     Past Surgical History:  Procedure Laterality Date  . APPENDECTOMY  1951  . CATARACT EXTRACTION Left 04/19/2017  . LAPAROTOMY  03/07/2012   Procedure: EXPLORATORY LAPAROTOMY;  Surgeon: Angelia Mould, MD;  Location: [facility] OR;  Service: Vascular;  Laterality: N/A;  Exploratory Laparotomy with Evacuation of Hematoma.  . TONSILLECTOMY  1943     Medications: Current Meds  Medication Sig  . ALPRAZolam (XANAX) 0.5 MG tablet Take 0.5 tablets (0.25 mg total) by mouth 2 (two) times daily as needed. For anxiety  . Cholecalciferol (VITAMIN D) 2000 UNITS CAPS Take 1 capsule by mouth daily.  Marland Kitchen esomeprazole (NEXIUM) 20 MG capsule Take 20 mg by mouth as needed (reflux).   . metoprolol succinate (TOPROL-XL) 25 MG 24 hr tablet TAKE ONE TABLET BY MOUTH ONCE DAILY  . Multiple Vitamin (MULTIVITAMIN WITH MINERALS) TABS Take 1 tablet by mouth daily.  . multivitamin-lutein (OCUVITE-LUTEIN) CAPS Take 1 capsule by mouth daily.  . simvastatin (ZOCOR) 20 MG tablet Take 1 tablet (20 mg total) by mouth at bedtime.  Marland Kitchen warfarin (COUMADIN) 3 MG tablet Take as directed by coumadin clinic  . [DISCONTINUED] diltiazem (DILACOR XR) 180 MG 24 hr capsule Take 1 capsule (180 mg total) by mouth daily.     Allergies: Allergies  Allergen Reactions  . Penicillins Swelling  Angioedema Has patient had a PCN reaction causing immediate rash, facial/tongue/throat swelling, SOB or lightheadedness with hypotension: yes Has patient had a PCN reaction causing severe rash involving mucus membranes or skin necrosis: no Has patient had a PCN reaction that required hospitalization: no Has patient had a PCN reaction occurring within the last 10 years: no- about 70 yrs ago If all of the above answers are "NO", then may proceed with Cephalosporin use.   . Alendronate Sodium      REACTION: INTOL to Fosamax per pt.  . Atorvastatin     REACTION: INTOL to Lipitor per pt.  . Morphine And Related     Makes her mean per pt    Social History: The patient  reports that she quit smoking about 66 years ago. Her smoking use included cigarettes. She has a 0.50 pack-year smoking history. She has never used smokeless tobacco. She reports that she does not drink alcohol or use drugs.   Family History: The patient's family history includes Breast cancer in her sister; Heart disease in her sister; Hemochromatosis in her father; Lymphoma in her sister.   Review of Systems: Please see the history of present illness.   Otherwise, the review of systems is positive for none.   All other systems are reviewed and negative.   Physical Exam: VS:  BP 130/68 (BP Location: Left Arm, Patient Position: Sitting, Cuff Size: Normal)   Pulse 78   Ht 5\' 4"  (1.626 m)   Wt 146 lb 12.8 oz (66.6 kg)   BMI 25.20 kg/m  .  BMI Body mass index is 25.2 kg/m.  Wt Readings from Last 3 Encounters:  03/29/18 146 lb 12.8 oz (66.6 kg)  04/28/17 149 lb (67.6 kg)  03/29/17 151 lb (68.5 kg)    General: Pleasant. Elderly female. She looks younger than her stated age. Alert and in no acute distress.   HEENT: Normal.  Neck: Supple, no JVD, carotid bruits, or masses noted.  Cardiac: Irregular irregular rhythm. Rate is ok. No significant edema.  Respiratory:  Lungs are clear to auscultation bilaterally with normal work of breathing.  GI: Soft and nontender.  MS: No deformity or atrophy. Gait and ROM intact.  Skin: Warm and dry. Color is normal.  Neuro:  Strength and sensation are intact and no gross focal deficits noted.  Psych: Alert, appropriate and with normal affect.   LABORATORY DATA:  EKG:  EKG is ordered today. This demonstrates AF with controlled VR of 78. Septal Q's - unchanged.   Lab Results  Component Value Date   WBC 5.3 04/28/2017   HGB 14.3 04/28/2017   HCT 43.2 04/28/2017   PLT 213.0  04/28/2017   GLUCOSE 91 04/28/2017   CHOL 143 04/28/2017   TRIG 104.0 04/28/2017   HDL 53.00 04/28/2017   LDLCALC 70 04/28/2017   ALT 16 04/28/2017   AST 23 04/28/2017   NA 141 04/28/2017   K 3.8 04/28/2017   CL 102 04/28/2017   CREATININE 0.77 04/28/2017   BUN 15 04/28/2017   CO2 33 (H) 04/28/2017   TSH 2.39 04/27/2016   INR 2.4 03/29/2018     BNP (last 3 results) No results for input(s): BNP in the last 8760 hours.  ProBNP (last 3 results) No results for input(s): PROBNP in the last 8760 hours.   Other Studies Reviewed Today:  Echo Study Conclusions from 02/2012  - Left ventricle: The cavity size was normal. Wall thickness was normal. The estimated ejection fraction was 60%. Wall  motion was normal; there were no regional wall motion abnormalities. - Mitral valve: Mild regurgitation. - Left atrium: The atrium was mildly dilated. - Right atrium: The atrium was mildly dilated. - Pulmonary arteries: PA peak pressure: 65mm Hg (S).  Assessment/Plan:  1. Permanent AF - managed with rate control and anticoagulation. She is on both beta blocker and CCB. She has had a prior history of embolic SMA occlusion when off warfarin in the past, she would need Lovenox bridge if she needs to stop warfarin in the future. She needs baseline labs today. I think she is doing quite well overall.   2. History of gastric AVMs: rechecking lab today. No bleeding noted.   3. HLD - on statin therapy - seeing PCP next month for lipids and visit.   4. Advanced age - still doing quite well.   Current medicines are reviewed with the patient today.  The patient does not have concerns regarding medicines other than what has been noted above.  The following changes have been made:  See above.  Labs/ tests ordered today include:    Orders Placed This Encounter  Procedures  . Basic metabolic panel  . CBC     Disposition:   FU with me in 1 year.   Patient is agreeable to this  plan and will call if any problems develop in the interim.   SignedTruitt Merle, NP  03/29/2018 2:10 PM  [facility] [address] [place], [place]  [address] Phone: [phone] Fax: [phone]

## 2018-03-29 NOTE — Patient Instructions (Signed)
Description   Continue taking 1/2 tablet everyday except 1 tablet on Tuesdays. Continue eating your 2 servings each week. Recheck INR in 3 weeks. Coumadin Clinic # [phone].

## 2018-03-30 LAB — CBC
Hematocrit: 40.8 % (ref 34.0–46.6)
Hemoglobin: 13.9 g/dL (ref 11.1–15.9)
MCH: 31 pg (ref 26.6–33.0)
MCHC: 34.1 g/dL (ref 31.5–35.7)
MCV: 91 fL (ref 79–97)
Platelets: 213 10*3/uL (ref 150–450)
RBC: 4.49 x10E6/uL (ref 3.77–5.28)
RDW: 14.2 % (ref 12.3–15.4)
WBC: 7.1 10*3/uL (ref 3.4–10.8)

## 2018-03-30 LAB — BASIC METABOLIC PANEL
BUN/Creatinine Ratio: 19 (ref 12–28)
BUN: 13 mg/dL (ref 8–27)
CO2: 24 mmol/L (ref 20–29)
Calcium: 9.3 mg/dL (ref 8.7–10.3)
Chloride: 101 mmol/L (ref 96–106)
Creatinine, Ser: 0.69 mg/dL (ref 0.57–1.00)
GFR calc Af Amer: 90 mL/min/{1.73_m2} (ref >59)
GFR calc non Af Amer: 78 mL/min/{1.73_m2} (ref >59)
Glucose: 79 mg/dL (ref 65–99)
Potassium: 4.6 mmol/L (ref 3.5–5.2)
Sodium: 139 mmol/L (ref 134–144)

## 2018-03-31 ENCOUNTER — Other Ambulatory Visit: Payer: Self-pay | Admitting: *Deleted

## 2018-03-31 MED ORDER — DILTIAZEM HCL ER COATED BEADS 180 MG PO CP24: 180.0000 mg | ORAL_CAPSULE | Freq: Every day | ORAL | 3 refills | Status: DC

## 2018-04-18 ENCOUNTER — Ambulatory Visit (INDEPENDENT_AMBULATORY_CARE_PROVIDER_SITE_OTHER): Payer: Medicare Other | Admitting: Pharmacist

## 2018-04-18 DIAGNOSIS — I4891 Unspecified atrial fibrillation: Secondary | ICD-10-CM | POA: Diagnosis not present

## 2018-04-18 DIAGNOSIS — Z7901 Long term (current) use of anticoagulants: Secondary | ICD-10-CM

## 2018-04-18 DIAGNOSIS — K55069 Acute infarction of intestine, part and extent unspecified: Secondary | ICD-10-CM

## 2018-04-18 LAB — POCT INR: INR: 1.9 — AB (ref 2.0–3.0)

## 2018-04-18 NOTE — Patient Instructions (Signed)
Description   Continue taking 1/2 tablet everyday except 1 tablet on Tuesdays. Continue eating your 2 servings each week. Recheck INR in 3 weeks. Coumadin Clinic # [phone].

## 2018-05-01 NOTE — Progress Notes (Signed)
Subjective:   Lisa Douglas is a 84 y.o. female who presents for Medicare Annual (Subsequent) preventive examination.  Lives alone in two story home. No difficulty with stairs. Review of Systems:   No ROS.  Medicare Wellness Visit. Additional risk factors are reflected in the social history.  Sleeps at least 8 hrs/night. Gets up 2-3x/night to use restroom. Naps occasionally. States she drinks water off and on but does not drink enough. Discussed dangers of dehydration and importance of increasing water intake. Does not drink juice or soft drinks.  Eats 3 meals/day. Occasional tea.  Breakfast: 1 glass warm water, cereal  Lunch: hot dog or sandwich, take out usually  Dinner: frozen meals  Cardiac Risk Factors include: advanced age (>52men, >35 women);dyslipidemia;family history of premature cardiovascular disease;hypertension      Objective:     Vitals: BP 134/60   Pulse 87   Resp 16   Ht 5\' 4"  (1.626 m)   Wt 147 lb 11.3 oz (67 kg)   SpO2 98%   BMI 25.35 kg/m   Body mass index is 25.35 kg/m.  Advanced Directives 05/02/2018 04/28/2017 06/30/2016 06/22/2015 03/03/2012 03/03/2012  Does Patient Have a Medical Advance Directive? Yes Yes Yes Yes Patient does not have advance directive Patient does not have advance directive;Patient would not like information  Type of Advance Directive Living will;Healthcare Power of [place];Living will [place];Living will - -  Does patient want to make changes to medical advance directive? No - Patient declined - No - Patient declined No - Patient declined - -  Copy of [place] in Chart? No - copy requested - No - copy requested No - copy requested - -  Would patient like information on creating a medical advance directive? - - Yes - Educational materials given - - -  Pre-existing out of facility DNR order (yellow form or pink MOST form) - - - - No No    Tobacco Social History    Tobacco Use  Smoking Status Former Smoker  . Packs/day: 0.25  . Years: 2.00  . Pack years: 0.50  . Types: Cigarettes  . Last attempt to quit: 08/24/1951  . Years since quitting: 66.7  Smokeless Tobacco Never Used  Tobacco Comment   smoked at age 58 for a short time--a few months     Counseling given: Not Answered Comment: smoked at age 13 for a short time--a few months   Past Medical History:  Diagnosis Date  . 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 Not current problem.    . Angiodysplasia of intestine (without mention of hemorrhage)   . Anxiety state, unspecified   . Atrial fibrillation ([place])   . Diverticulosis of colon (without mention of hemorrhage)   . Diverticulosis of colon with hemorrhage 06/12/2014  . Esophageal reflux   . Macular degeneration (senile) of retina, unspecified   . 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 Osteoarthrosis, unspecified whether generalized or localized, unspecified site   . Osteoporosis, unspecified   . Other and unspecified hyperlipidemia   . Unspecified venous (peripheral) insufficiency    Past Surgical History:  Procedure Laterality Date  . APPENDECTOMY  1951  . CATARACT EXTRACTION Left 04/19/2017  . LAPAROTOMY  03/07/2012   Procedure: EXPLORATORY LAPAROTOMY;  Surgeon: Angelia Mould, MD;  Location: [facility] OR;  Service: Vascular;  Laterality: N/A;  Exploratory Laparotomy with Evacuation of Hematoma.  Marland Kitchen  TONSILLECTOMY  1943   Family History  Problem Relation Age of Onset  . Hemochromatosis Father        tested in [place], denies history  . Heart disease Sister   . Breast cancer Sister        died 54, not sure when she had it  . Lymphoma Sister    Social History   Socioeconomic History  . Marital status: Single    Spouse name: Not on file  . Number of children: Not on file  . Years of education: Not on file  . Highest education level: Not on file  Occupational History  .  Occupation: retired  Scientific laboratory technician  . Financial resource strain: Not on file  . Food insecurity:    Worry: Not on file    Inability: Not on file  . Transportation needs:    Medical: Not on file    Non-medical: Not on file  Tobacco Use  . Smoking status: Former Smoker    Packs/day: 0.25    Years: 2.00    Pack years: 0.50    Types: Cigarettes    Last attempt to quit: 08/24/1951    Years since quitting: 66.7  . Smokeless tobacco: Never Used  . Tobacco comment: smoked at age 68 for a short time--a few months  Substance and Sexual Activity  . Alcohol use: No  . Drug use: No  . Sexual activity: Not on file  Lifestyle  . Physical activity:    Days per week: Not on file    Minutes per session: Not on file  . Stress: Not on file  Relationships  . Social connections:    Talks on phone: Not on file    Gets together: Not on file    Attends religious service: Not on file    Active member of club or organization: Not on file    Attends meetings of clubs or organizations: Not on file    Relationship status: Not on file  Other Topics Concern  . Not on file  Social History Narrative   Born and raised in [place]. Lived on campus of [place] down.    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       Lives with college age student that she helped raise [name], her niece's younger daughter, graduating may 2017)   Never married. No kids. 1 sister living (out of 8), a lot of contact with nieces and nephes.       Hobbies: used to sew and knit (vision issues now), used to bowl   Now enjoys computer time-keeps up with family, not a big tv time    Outpatient Encounter Medications as of 05/02/2018  Medication Sig  . ALPRAZolam (XANAX) 0.5 MG tablet Take 0.5 tablets (0.25 mg total) by mouth 2 (two) times daily as needed. For anxiety  .  Cholecalciferol (VITAMIN D) 2000 UNITS CAPS Take 1 capsule by mouth daily.  Marland Kitchen diltiazem (CARDIZEM CD) 180 MG 24 hr capsule Take 1 capsule (180 mg total) by mouth daily.  Marland Kitchen esomeprazole (NEXIUM) 20 MG capsule Take 20 mg by mouth as needed (reflux).   . metoprolol succinate (TOPROL-XL) 25 MG 24 hr tablet TAKE ONE TABLET BY MOUTH ONCE DAILY  . Multiple Vitamin (MULTIVITAMIN WITH MINERALS) TABS Take 1 tablet by mouth daily.  Marland Kitchen  multivitamin-lutein (OCUVITE-LUTEIN) CAPS Take 1 capsule by mouth daily.  . simvastatin (ZOCOR) 20 MG tablet Take 1 tablet (20 mg total) by mouth at bedtime.  Marland Kitchen warfarin (COUMADIN) 3 MG tablet Take as directed by coumadin clinic  . [DISCONTINUED] diltiazem (DILACOR XR) 180 MG 24 hr capsule Take 1 capsule (180 mg total) by mouth daily.  . [DISCONTINUED] simvastatin (ZOCOR) 20 MG tablet Take 1 tablet (20 mg total) by mouth at bedtime.   No facility-administered encounter medications on file as of 05/02/2018.     Activities of Daily Living In your present state of health, do you have any difficulty performing the following activities: 05/02/2018  Hearing? N  Vision? N  Difficulty concentrating or making decisions? N  Walking or climbing stairs? N  Dressing or bathing? N  Doing errands, shopping? N  Preparing Food and eating ? N  Using the Toilet? N  In the past six months, have you accidently leaked urine? N  Do you have problems with loss of bowel control? N  Managing your Medications? N  Managing your Finances? N  Housekeeping or managing your Housekeeping? Y  Comment Patient states she has a hard time keeping her house clean because it is too large.  Some recent data might be hidden    Patient Care Team: Marin Olp, MD as PCP - General (Family Medicine) Shon Hough, MD as Consulting Physician (Ophthalmology) Feliz Beam, MD as Referring Physician (Ophthalmology) Burtis Junes, NP as Nurse Practitioner (Nurse Practitioner)    Assessment:    This is a routine wellness examination for [facility].  Exercise Activities and Dietary recommendations Current Exercise Habits: The patient does not participate in regular exercise at present, Exercise limited by: None identified  Goals    . patient     Plans to move to smaller area. Will start checking out the community to see where you desire to live        Fall Risk Fall Risk  05/02/2018 04/28/2017 05/31/2016 05/28/2016 04/27/2016  Falls in the past year? No No No No No  Comment - recovered from fall last year  - - -   Depression Screen PHQ 2/9 Scores 05/02/2018 05/02/2018 04/28/2017 05/31/2016  PHQ - 2 Score 0 0 0 0  PHQ- 9 Score 0 - - -   PHQ2 and PHQ9 completed. No signs or symptoms of depression. Patient lives alone but does well she states. Total time spent on topic was 9 minutes.  Cognitive Function MMSE - Mini Mental State Exam 04/28/2017  Not completed: (No Data)  Ad8 score reviewed for issues:  Issues making decisions:0  Less interest in hobbies / activities:0  Repeats questions, stories (family complaining):0  Trouble using ordinary gadgets (microwave, computer, phone):0  Forgets the month or year: 0  Mismanaging finances: 0  Remembering appts:0  Daily problems with thinking and/or memory:0 Ad8 score is=0 Patient knits regularly.     6CIT Screen 04/28/2017  What Year? 0 points  What month? 0 points  What time? 0 points  Count back from 20 0 points  Months in reverse 0 points  Repeat phrase 0 points  Total Score 0    Immunization History  Administered Date(s) Administered  . Influenza Split 05/26/2011, 06/02/2012  . Influenza Whole 05/15/2008, 05/21/2009, 05/27/2010  . Influenza, High Dose Seasonal PF 04/27/2016, 04/28/2017, 05/02/2018  . Influenza,inj,Quad PF,6+ Mos 05/09/2013, 05/23/2014, 04/29/2015  . Pneumococcal Conjugate-13 10/29/2014  . Pneumococcal Polysaccharide-23 11/03/2012  . Td 03/31/2010  . Zoster 09/24/2011  Screening Tests Health  Maintenance  Topic Date Due  . TETANUS/TDAP  03/31/2020  . INFLUENZA VACCINE  Completed  . DEXA SCAN  Completed  . PNA vac Low Risk Adult  Completed      Plan:   Follow up with PCP as directed.  I have personally reviewed and noted the following in the patient's chart:   . Medical and social history . Use of alcohol, tobacco or illicit drugs  . Current medications and supplements . Functional ability and status . Nutritional status . Physical activity . Advanced directives . List of other physicians . Vitals . Screenings to include cognitive, depression, and falls . Referrals and appointments  In addition, I have reviewed and discussed with patient certain preventive protocols, quality metrics, and best practice recommendations. A written personalized care plan for preventive services as well as general preventive health recommendations were provided to patient.     Williemae Area, RN  05/02/2018

## 2018-05-01 NOTE — Progress Notes (Signed)
PCP notes:   Health maintenance: Up to date.   Abnormal screenings: None.   Patient concerns: None.   Nurse concerns: None.   Next PCP appt: 10/31/2018

## 2018-05-02 ENCOUNTER — Ambulatory Visit (INDEPENDENT_AMBULATORY_CARE_PROVIDER_SITE_OTHER): Payer: Medicare Other | Admitting: Family Medicine

## 2018-05-02 ENCOUNTER — Encounter: Payer: Self-pay | Admitting: Family Medicine

## 2018-05-02 ENCOUNTER — Ambulatory Visit (INDEPENDENT_AMBULATORY_CARE_PROVIDER_SITE_OTHER): Payer: Medicare Other | Admitting: *Deleted

## 2018-05-02 VITALS — BP 134/60 | HR 87 | Temp 98.0°F | Ht 64.0 in | Wt 147.8 lb

## 2018-05-02 VITALS — BP 134/60 | HR 87 | Resp 16 | Ht 64.0 in | Wt 147.7 lb

## 2018-05-02 DIAGNOSIS — Z0000 Encounter for general adult medical examination without abnormal findings: Secondary | ICD-10-CM

## 2018-05-02 DIAGNOSIS — M810 Age-related osteoporosis without current pathological fracture: Secondary | ICD-10-CM

## 2018-05-02 DIAGNOSIS — Z1331 Encounter for screening for depression: Secondary | ICD-10-CM | POA: Diagnosis not present

## 2018-05-02 DIAGNOSIS — I10 Essential (primary) hypertension: Secondary | ICD-10-CM | POA: Diagnosis not present

## 2018-05-02 DIAGNOSIS — F411 Generalized anxiety disorder: Secondary | ICD-10-CM | POA: Diagnosis not present

## 2018-05-02 DIAGNOSIS — E785 Hyperlipidemia, unspecified: Secondary | ICD-10-CM

## 2018-05-02 DIAGNOSIS — Z23 Encounter for immunization: Secondary | ICD-10-CM

## 2018-05-02 DIAGNOSIS — M1712 Unilateral primary osteoarthritis, left knee: Secondary | ICD-10-CM | POA: Diagnosis not present

## 2018-05-02 DIAGNOSIS — I4891 Unspecified atrial fibrillation: Secondary | ICD-10-CM

## 2018-05-02 LAB — CBC
HCT: 41.8 % (ref 36.0–46.0)
Hemoglobin: 14.2 g/dL (ref 12.0–15.0)
MCHC: 34 g/dL (ref 30.0–36.0)
MCV: 94.3 fl (ref 78.0–100.0)
PLATELETS: 204 10*3/uL (ref 150.0–400.0)
RBC: 4.44 Mil/uL (ref 3.87–5.11)
RDW: 13.7 % (ref 11.5–15.5)
WBC: 5.3 10*3/uL (ref 4.0–10.5)

## 2018-05-02 LAB — LIPID PANEL
CHOLESTEROL: 131 mg/dL (ref 0–200)
HDL: 57.4 mg/dL (ref >39.00)
LDL Cholesterol: 54 mg/dL (ref 0–99)
NonHDL: 73.11
TRIGLYCERIDES: 95 mg/dL (ref 0.0–149.0)
Total CHOL/HDL Ratio: 2
VLDL: 19 mg/dL (ref 0.0–40.0)

## 2018-05-02 LAB — COMPREHENSIVE METABOLIC PANEL
ALK PHOS: 53 U/L (ref 39–117)
ALT: 16 U/L (ref 0–35)
AST: 22 U/L (ref 0–37)
Albumin: 4 g/dL (ref 3.5–5.2)
BILIRUBIN TOTAL: 0.7 mg/dL (ref 0.2–1.2)
BUN: 16 mg/dL (ref 6–23)
CALCIUM: 9.2 mg/dL (ref 8.4–10.5)
CO2: 32 mEq/L (ref 19–32)
CREATININE: 0.78 mg/dL (ref 0.40–1.20)
Chloride: 103 mEq/L (ref 96–112)
GFR: 74.05 mL/min (ref >60.00)
Glucose, Bld: 89 mg/dL (ref 70–99)
Potassium: 4.7 mEq/L (ref 3.5–5.1)
Sodium: 140 mEq/L (ref 135–145)
TOTAL PROTEIN: 6.4 g/dL (ref 6.0–8.3)

## 2018-05-02 LAB — POC URINALSYSI DIPSTICK (AUTOMATED)
Bilirubin, UA: NEGATIVE
GLUCOSE UA: NEGATIVE
Ketones, UA: NEGATIVE
Leukocytes, UA: NEGATIVE
NITRITE UA: NEGATIVE
PROTEIN UA: NEGATIVE
RBC UA: NEGATIVE
Spec Grav, UA: 1.025 (ref 1.010–1.025)
UROBILINOGEN UA: 0.2 U/dL
pH, UA: 5 (ref 5.0–8.0)

## 2018-05-02 MED ORDER — SIMVASTATIN 20 MG PO TABS: 20.0000 mg | ORAL_TABLET | Freq: Every day | ORAL | 11 refills | Status: DC

## 2018-05-02 NOTE — Assessment & Plan Note (Signed)
S:  Patient did not tolerate fosamax and is not interested in injectables like prolia or reclast. She states side effect on fosamax - is "unknown" but she knows she doesn't like it.  She does take calcium and vitamin D A/P:declines dexa once again as states wouldn't take medicine even for poor scores

## 2018-05-02 NOTE — Patient Instructions (Signed)
Lisa Douglas ,  Bring a copy of your living will and/or healthcare power of attorney to your next office visit.  Thank you for taking time to come for your Medicare Wellness Visit. I appreciate your ongoing commitment to your health goals. Please review the following plan we discussed and let me know if I can assist you in the future.   These are the goals we discussed: Goals    . patient     Plans to move to smaller area. Will start checking out the community to see where you desire to live        This is a list of the screening recommended for you and due dates:  Health Maintenance  Topic Date Due  . Flu Shot  03/23/2018  . Tetanus Vaccine  03/31/2020  . DEXA scan (bone density measurement)  Completed  . Pneumonia vaccines  Completed   Preventive Care for Adults  A healthy lifestyle and preventive care can promote health and wellness. Preventive health guidelines for adults include the following key practices.  . A routine yearly physical is a good way to check with your health care provider about your health and preventive screening. It is a chance to share any concerns and updates on your health and to receive a thorough exam.  . Visit your dentist for a routine exam and preventive care every 6 months. Brush your teeth twice a day and floss once a day. Good oral hygiene prevents tooth decay and gum disease.  . The frequency of eye exams is based on your age, health, family medical history, use  of contact lenses, and other factors. Follow your health care provider's recommendations for frequency of eye exams.  . Eat a healthy diet. Foods like vegetables, fruits, whole grains, low-fat dairy products, and lean protein foods contain the nutrients you need without too many calories. Decrease your intake of foods high in solid fats, added sugars, and salt. Eat the right amount of calories for you. Get information about a proper diet from your health care provider, if necessary.  .  Regular physical exercise is one of the most important things you can do for your health. Most adults should get at least 150 minutes of moderate-intensity exercise (any activity that increases your heart rate and causes you to sweat) each week. In addition, most adults need muscle-strengthening exercises on 2 or more days a week.  Silver Sneakers may be a benefit available to you. To determine eligibility, you may visit the website: www.silversneakers.com or contact program at (513)073-5159 Mon-Fri between 8AM-8PM.   . Maintain a healthy weight. The body mass index (BMI) is a screening tool to identify possible weight problems. It provides an estimate of body fat based on height and weight. Your health care provider can find your BMI and can help you achieve or maintain a healthy weight.   For adults 20 years and older: ? A BMI below 18.5 is considered underweight. ? A BMI of 18.5 to 24.9 is normal. ? A BMI of 25 to 29.9 is considered overweight. ? A BMI of 30 and above is considered obese.   . Maintain normal blood lipids and cholesterol levels by exercising and minimizing your intake of saturated fat. Eat a balanced diet with plenty of fruit and vegetables. Blood tests for lipids and cholesterol should begin at age 61 and be repeated every 5 years. If your lipid or cholesterol levels are high, you are over 50, or you are at high risk  for heart disease, you may need your cholesterol levels checked more frequently. Ongoing high lipid and cholesterol levels should be treated with medicines if diet and exercise are not working.  . If you smoke, find out from your health care provider how to quit. If you do not use tobacco, please do not start.  . If you choose to drink alcohol, please do not consume more than 2 drinks per day. One drink is considered to be 12 ounces (355 mL) of beer, 5 ounces (148 mL) of wine, or 1.5 ounces (44 mL) of liquor.  . If you are 34-42 years old, ask your health care  provider if you should take aspirin to prevent strokes.  . Use sunscreen. Apply sunscreen liberally and repeatedly throughout the day. You should seek shade when your shadow is shorter than you. Protect yourself by wearing long sleeves, pants, a wide-brimmed hat, and sunglasses year round, whenever you are outdoors.  . Once a month, do a whole body skin exam, using a mirror to look at the skin on your back. Tell your health care provider of new moles, moles that have irregular borders, moles that are larger than a pencil eraser, or moles that have changed in shape or color.

## 2018-05-02 NOTE — Assessment & Plan Note (Signed)
S: controlled on  metoprolol 25mg  and diltiazem (helps with BP but mainly for rate control) BP Readings from Last 3 Encounters:  05/02/18 134/60  03/29/18 130/68  04/28/17 108/60  A/P: Continue current meds

## 2018-05-02 NOTE — Assessment & Plan Note (Signed)
S: left knee pain with walking. Worse with weather change. Tylenol helps some. biofreeze helps some. May want to try a CBD mint compound that nephew gave her. Pain can get up to 10/10 if turns right way.  A/P: at her age high probability of arthritis. Has not had imaging. We opted to do a therapeutic and diagnostic injection today initially- after going over potential risks she opted out for now (will call if changes mind)

## 2018-05-02 NOTE — Assessment & Plan Note (Signed)
S: patient with chronic atrial fibrillation rate controlled on diltiazem and metoprolol. She is on coumadin at cardiology clinic. Keeps tighter INR goal 2-2.5 due to prior rectal bleed from angiodysplasia of colon balanced with a fib risk and hx SMA thrombosis A/P: remains rate controlled and anticoagulated- continue current rx

## 2018-05-02 NOTE — Assessment & Plan Note (Signed)
given #20 xanax last September. We discussed if any more falls would need to discontinue. She only took 1 in last year thankfully

## 2018-05-02 NOTE — Patient Instructions (Addendum)
Please stop by lab before you go  Call us if you change your mind on injection. I wrote 6 month follow up but if you miss that - make sure to see [place] at least yearly.   Great to see you today!

## 2018-05-02 NOTE — Progress Notes (Signed)
I have reviewed and agree with note, evaluation, plan.  See my separate note from today  Eila Runyan, MD  

## 2018-05-02 NOTE — Progress Notes (Signed)
Subjective:  Lisa Douglas is a 84 y.o. year old very pleasant female patient who presents for/with See problem oriented charting ROS- no shortness of breath walking into office but states can get short of breath if really pushes herself. No chest pain. No edema. No recent bleeding.    Past Medical History-  Patient Active Problem List   Diagnosis Date Noted  . ATRIAL FIBRILLATION, CHRONIC 10/03/2007    Priority: High  . Essential hypertension 04/27/2016    Priority: Medium  . 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 Anxiety state 10/03/2007    Priority: Medium  . Osteoporosis 10/03/2007    Priority: Medium  . Hyperlipidemia 10/02/2007    Priority: Medium  . Angiodysplasia of colon 06/01/2004    Priority: Medium  . Epigastric hernia 12/23/2014    Priority: Low  . Diverticulitis of colon 06/12/2014    Priority: Low  . CKD (chronic kidney disease), stage II 03/26/2014    Priority: Low  . Long term current use of anticoagulant therapy 03/20/2012    Priority: Low  . Leg cramps 12/30/2011    Priority: Low  . Vitamin D deficiency 04/01/2011    Priority: Low  . Advanced nonexudative age-related macular degeneration of both eyes without subfoveal involvement 10/03/2007    Priority: Low  . VENOUS INSUFFICIENCY 10/03/2007    Priority: Low  . GERD 10/03/2007    Priority: Low  . DEGENERATIVE JOINT DISEASE 10/03/2007    Priority: Low  . Osteoarthritis of left knee 10/26/2016  . Posterior vitreous detachment, bilateral 05/25/2016  . Nuclear cataract of both eyes 11/08/2014  . Macular hole 08/31/2011    Medications- reviewed and updated Current Outpatient Medications  Medication Sig Dispense Refill  . ALPRAZolam (XANAX) 0.5 MG tablet Take 0.5 tablets (0.25 mg total) by mouth 2 (two) times daily as needed. For anxiety 20 tablet 0  . Cholecalciferol (VITAMIN D) 2000 UNITS CAPS Take 1 capsule by mouth daily.    Marland Kitchen diltiazem (CARDIZEM CD) 180 MG 24  hr capsule Take 1 capsule (180 mg total) by mouth daily. 90 capsule 3  . esomeprazole (NEXIUM) 20 MG capsule Take 20 mg by mouth as needed (reflux).     . metoprolol succinate (TOPROL-XL) 25 MG 24 hr tablet TAKE ONE TABLET BY MOUTH ONCE DAILY 90 tablet 3  . Multiple Vitamin (MULTIVITAMIN WITH MINERALS) TABS Take 1 tablet by mouth daily.    . multivitamin-lutein (OCUVITE-LUTEIN) CAPS Take 1 capsule by mouth daily.    . simvastatin (ZOCOR) 20 MG tablet Take 1 tablet (20 mg total) by mouth at bedtime. 30 tablet 11  . warfarin (COUMADIN) 3 MG tablet Take as directed by coumadin clinic 70 tablet 1   No current facility-administered medications for this visit.     Objective: BP 134/60 (BP Location: Left Arm, Patient Position: Sitting, Cuff Size: Large)   Pulse 87   Temp 98 F (36.7 C) (Oral)   Ht 5\' 4"  (1.626 m)   Wt 147 lb 12.8 oz (67 kg)   SpO2 98%   BMI 25.37 kg/m  Gen: NAD, resting comfortably CV: irregularly irregular no murmurs rubs or gallops Lungs: CTAB no crackles, wheeze, rhonchi Abdomen: soft/nontender/nondistended/normal bowel sounds.  Ext: no edema Skin: warm, dry Neuro: speech normal  Assessment/Plan:  ATRIAL FIBRILLATION, CHRONIC S: patient with chronic atrial fibrillation rate controlled on diltiazem and metoprolol. She is on coumadin at cardiology clinic. Keeps tighter INR goal 2-2.5 due to prior rectal bleed from  angiodysplasia of colon balanced with a fib risk and hx SMA thrombosis A/P: remains rate controlled and anticoagulated- continue current rx  Hyperlipidemia S:  controlled on simvastatin 20mg  Lab Results  Component Value Date   CHOL 143 04/28/2017   HDL 53.00 04/28/2017   LDLCALC 70 04/28/2017   TRIG 104.0 04/28/2017   CHOLHDL 3 04/28/2017   A/P: update lipids today  Osteoarthritis of left knee S: left knee pain with walking. Worse with weather change. Tylenol helps some. biofreeze helps some. May want to try a CBD mint compound that nephew gave her.  Pain can get up to 10/10 if turns right way.  A/P: at her age high probability of arthritis. Has not had imaging. We opted to do a therapeutic and diagnostic injection today initially- after going over potential risks she opted out for now (will call if changes mind)   Essential hypertension S: controlled on  metoprolol 25mg  and diltiazem (helps with BP but mainly for rate control) BP Readings from Last 3 Encounters:  05/02/18 134/60  03/29/18 130/68  04/28/17 108/60  A/P: Continue current meds  Osteoporosis S:  Patient did not tolerate fosamax and is not interested in injectables like prolia or reclast. She states side effect on fosamax - is "unknown" but she knows she doesn't like it.  She does take calcium and vitamin D A/P:declines dexa once again as states wouldn't take medicine even for poor scores  Anxiety state given #20 xanax last September. We discussed if any more falls would need to discontinue. She only took 1 in last year thankfully   Future Appointments  Date Time Provider [place]  05/02/2018 10:00 AM [facility]  05/09/2018  1:00 PM [facility]  03/27/2019  1:30 PM Burtis Junes, NP [facility]   No follow-ups on file.  Lab/Order associations: Atrial fibrillation, unspecified type ([place]) - Plan: CBC, Comprehensive metabolic panel  Hyperlipidemia, unspecified hyperlipidemia type - Plan: CBC, Comprehensive metabolic panel, Lipid panel, POCT Urinalysis Dipstick (Automated)  Essential hypertension - Plan: CBC, Comprehensive metabolic panel, Lipid panel, POCT Urinalysis Dipstick (Automated)  Age-related osteoporosis without current pathological fracture  Need for prophylactic vaccination and inoculation against influenza - Plan: Flu vaccine HIGH DOSE PF  Osteoarthritis of left knee, unspecified osteoarthritis type  Anxiety state  Meds ordered this encounter  Medications  .  simvastatin (ZOCOR) 20 MG tablet    Sig: Take 1 tablet (20 mg total) by mouth at bedtime.    Dispense:  30 tablet    Refill:  11    Return precautions advised.  Garret Reddish, MD

## 2018-05-02 NOTE — Addendum Note (Signed)
Addended by: Frutoso Chase A on: 05/02/2018 10:24 AM   Modules accepted: Orders

## 2018-05-02 NOTE — Assessment & Plan Note (Signed)
S:  controlled on simvastatin 20mg  Lab Results  Component Value Date   CHOL 143 04/28/2017   HDL 53.00 04/28/2017   LDLCALC 70 04/28/2017   TRIG 104.0 04/28/2017   CHOLHDL 3 04/28/2017   A/P: update lipids today

## 2018-05-09 ENCOUNTER — Ambulatory Visit (INDEPENDENT_AMBULATORY_CARE_PROVIDER_SITE_OTHER): Payer: Medicare Other | Admitting: *Deleted

## 2018-05-09 DIAGNOSIS — K55069 Acute infarction of intestine, part and extent unspecified: Secondary | ICD-10-CM

## 2018-05-09 DIAGNOSIS — I4891 Unspecified atrial fibrillation: Secondary | ICD-10-CM | POA: Diagnosis not present

## 2018-05-09 DIAGNOSIS — Z7901 Long term (current) use of anticoagulants: Secondary | ICD-10-CM

## 2018-05-09 LAB — POCT INR: INR: 1.9 — AB (ref 2.0–3.0)

## 2018-05-09 NOTE — Patient Instructions (Signed)
Description   Today take 1.5 tablets, then Continue taking 1/2 tablet everyday except 1 tablet on Tuesdays. Continue eating your 2 servings each week. Recheck INR in 2 weeks. [facility] # [phone].

## 2018-05-23 ENCOUNTER — Ambulatory Visit (INDEPENDENT_AMBULATORY_CARE_PROVIDER_SITE_OTHER): Payer: Medicare Other | Admitting: *Deleted

## 2018-05-23 DIAGNOSIS — I4891 Unspecified atrial fibrillation: Secondary | ICD-10-CM | POA: Diagnosis not present

## 2018-05-23 DIAGNOSIS — K55069 Acute infarction of intestine, part and extent unspecified: Secondary | ICD-10-CM

## 2018-05-23 DIAGNOSIS — Z7901 Long term (current) use of anticoagulants: Secondary | ICD-10-CM

## 2018-05-23 LAB — POCT INR: INR: 2.1 (ref 2.0–3.0)

## 2018-05-23 NOTE — Patient Instructions (Addendum)
Description   Continue taking 1/2 tablet everyday except 1 tablet on Tuesdays. Continue eating your 2 servings each week. Recheck INR in 3 weeks. [facility] # [phone].

## 2018-05-28 ENCOUNTER — Other Ambulatory Visit: Payer: Self-pay | Admitting: Nurse Practitioner

## 2018-05-29 MED ORDER — DILTIAZEM HCL ER COATED BEADS 180 MG PO CP24: 180.0000 mg | ORAL_CAPSULE | Freq: Every day | ORAL | 3 refills | Status: DC

## 2018-06-13 ENCOUNTER — Ambulatory Visit (INDEPENDENT_AMBULATORY_CARE_PROVIDER_SITE_OTHER): Payer: Medicare Other | Admitting: Pharmacist

## 2018-06-13 DIAGNOSIS — Z7901 Long term (current) use of anticoagulants: Secondary | ICD-10-CM | POA: Diagnosis not present

## 2018-06-13 DIAGNOSIS — K55069 Acute infarction of intestine, part and extent unspecified: Secondary | ICD-10-CM

## 2018-06-13 DIAGNOSIS — I4891 Unspecified atrial fibrillation: Secondary | ICD-10-CM

## 2018-06-13 LAB — POCT INR: INR: 2 (ref 2.0–3.0)

## 2018-06-13 NOTE — Patient Instructions (Signed)
Description   Continue taking 1/2 tablet every day except 1 tablet on Tuesdays. Continue eating your 2 servings each week. Recheck INR in 4 weeks. [facility] # [phone].        

## 2018-06-14 ENCOUNTER — Encounter: Payer: Self-pay | Admitting: *Deleted

## 2018-07-11 ENCOUNTER — Ambulatory Visit (INDEPENDENT_AMBULATORY_CARE_PROVIDER_SITE_OTHER): Payer: Medicare Other | Admitting: *Deleted

## 2018-07-11 DIAGNOSIS — I4891 Unspecified atrial fibrillation: Secondary | ICD-10-CM

## 2018-07-11 DIAGNOSIS — K55069 Acute infarction of intestine, part and extent unspecified: Secondary | ICD-10-CM

## 2018-07-11 DIAGNOSIS — Z7901 Long term (current) use of anticoagulants: Secondary | ICD-10-CM

## 2018-07-11 LAB — POCT INR: INR: 2.2 (ref 2.0–3.0)

## 2018-07-11 NOTE — Patient Instructions (Signed)
Description   Continue taking 1/2 tablet every day except 1 tablet on Tuesdays. Continue eating your 2 servings each week. Recheck INR in 4 weeks. [facility] # [phone].

## 2018-08-08 DIAGNOSIS — H353133 Nonexudative age-related macular degeneration, bilateral, advanced atrophic without subfoveal involvement: Secondary | ICD-10-CM | POA: Diagnosis not present

## 2018-08-08 DIAGNOSIS — H35342 Macular cyst, hole, or pseudohole, left eye: Secondary | ICD-10-CM | POA: Diagnosis not present

## 2018-08-09 ENCOUNTER — Ambulatory Visit (INDEPENDENT_AMBULATORY_CARE_PROVIDER_SITE_OTHER): Payer: Medicare Other | Admitting: *Deleted

## 2018-08-09 DIAGNOSIS — K55069 Acute infarction of intestine, part and extent unspecified: Secondary | ICD-10-CM

## 2018-08-09 DIAGNOSIS — Z7901 Long term (current) use of anticoagulants: Secondary | ICD-10-CM

## 2018-08-09 DIAGNOSIS — I4891 Unspecified atrial fibrillation: Secondary | ICD-10-CM | POA: Diagnosis not present

## 2018-08-09 LAB — POCT INR: INR: 2.3 (ref 2.0–3.0)

## 2018-08-09 MED ORDER — WARFARIN SODIUM 3 MG PO TABS: ORAL_TABLET | ORAL | 1 refills | Status: DC

## 2018-08-09 NOTE — Patient Instructions (Signed)
Description   Continue taking 1/2 tablet every day except 1 tablet on Tuesdays. Continue eating your 2 servings each week. Recheck INR in 4 weeks. [facility] # [phone].

## 2018-09-05 ENCOUNTER — Ambulatory Visit (INDEPENDENT_AMBULATORY_CARE_PROVIDER_SITE_OTHER): Payer: Medicare Other

## 2018-09-05 DIAGNOSIS — Z7901 Long term (current) use of anticoagulants: Secondary | ICD-10-CM

## 2018-09-05 DIAGNOSIS — K55069 Acute infarction of intestine, part and extent unspecified: Secondary | ICD-10-CM | POA: Diagnosis not present

## 2018-09-05 DIAGNOSIS — I4891 Unspecified atrial fibrillation: Secondary | ICD-10-CM

## 2018-09-05 LAB — POCT INR: INR: 2.1 (ref 2.0–3.0)

## 2018-09-05 NOTE — Patient Instructions (Signed)
Continue taking 1/2 tablet every day except 1 tablet on Tuesdays. Continue eating your 2 servings each week. Recheck INR in 4 weeks. [facility] # [phone].

## 2018-10-03 ENCOUNTER — Ambulatory Visit (INDEPENDENT_AMBULATORY_CARE_PROVIDER_SITE_OTHER): Payer: Medicare Other | Admitting: Pharmacist

## 2018-10-03 DIAGNOSIS — K55069 Acute infarction of intestine, part and extent unspecified: Secondary | ICD-10-CM | POA: Diagnosis not present

## 2018-10-03 DIAGNOSIS — Z7901 Long term (current) use of anticoagulants: Secondary | ICD-10-CM | POA: Diagnosis not present

## 2018-10-03 DIAGNOSIS — I4891 Unspecified atrial fibrillation: Secondary | ICD-10-CM

## 2018-10-03 LAB — POCT INR: INR: 2.2 (ref 2.0–3.0)

## 2018-10-03 NOTE — Patient Instructions (Signed)
Continue taking 1/2 tablet every day except 1 tablet on Tuesdays. Continue eating your 2 servings each week. Recheck INR in 4 weeks. [facility] # [phone].

## 2018-10-31 ENCOUNTER — Encounter: Payer: Self-pay | Admitting: Family Medicine

## 2018-10-31 ENCOUNTER — Ambulatory Visit (INDEPENDENT_AMBULATORY_CARE_PROVIDER_SITE_OTHER): Payer: Medicare Other | Admitting: Family Medicine

## 2018-10-31 ENCOUNTER — Ambulatory Visit (INDEPENDENT_AMBULATORY_CARE_PROVIDER_SITE_OTHER): Payer: Medicare Other

## 2018-10-31 VITALS — BP 122/64 | HR 78 | Temp 97.8°F | Ht 64.0 in | Wt 153.4 lb

## 2018-10-31 DIAGNOSIS — I4891 Unspecified atrial fibrillation: Secondary | ICD-10-CM | POA: Diagnosis not present

## 2018-10-31 DIAGNOSIS — I700 Atherosclerosis of aorta: Secondary | ICD-10-CM | POA: Insufficient documentation

## 2018-10-31 DIAGNOSIS — K55069 Acute infarction of intestine, part and extent unspecified: Secondary | ICD-10-CM

## 2018-10-31 DIAGNOSIS — E785 Hyperlipidemia, unspecified: Secondary | ICD-10-CM

## 2018-10-31 DIAGNOSIS — Z7901 Long term (current) use of anticoagulants: Secondary | ICD-10-CM

## 2018-10-31 DIAGNOSIS — I10 Essential (primary) hypertension: Secondary | ICD-10-CM

## 2018-10-31 DIAGNOSIS — E559 Vitamin D deficiency, unspecified: Secondary | ICD-10-CM | POA: Diagnosis not present

## 2018-10-31 LAB — POCT INR: INR: 2.1 (ref 2.0–3.0)

## 2018-10-31 LAB — CBC
HCT: 42.4 % (ref 36.0–46.0)
Hemoglobin: 14.1 g/dL (ref 12.0–15.0)
MCHC: 33.3 g/dL (ref 30.0–36.0)
MCV: 95.3 fl (ref 78.0–100.0)
Platelets: 211 10*3/uL (ref 150.0–400.0)
RBC: 4.45 Mil/uL (ref 3.87–5.11)
RDW: 13.6 % (ref 11.5–15.5)
WBC: 5.6 10*3/uL (ref 4.0–10.5)

## 2018-10-31 LAB — COMPREHENSIVE METABOLIC PANEL
ALT: 16 U/L (ref 0–35)
AST: 22 U/L (ref 0–37)
Albumin: 4.1 g/dL (ref 3.5–5.2)
Alkaline Phosphatase: 52 U/L (ref 39–117)
BILIRUBIN TOTAL: 0.6 mg/dL (ref 0.2–1.2)
BUN: 17 mg/dL (ref 6–23)
CO2: 32 meq/L (ref 19–32)
Calcium: 9.5 mg/dL (ref 8.4–10.5)
Chloride: 104 mEq/L (ref 96–112)
Creatinine, Ser: 0.72 mg/dL (ref 0.40–1.20)
GFR: 76.33 mL/min (ref >60.00)
GLUCOSE: 76 mg/dL (ref 70–99)
Potassium: 4.6 mEq/L (ref 3.5–5.1)
SODIUM: 142 meq/L (ref 135–145)
TOTAL PROTEIN: 6.3 g/dL (ref 6.0–8.3)

## 2018-10-31 LAB — TSH: TSH: 2.51 u[IU]/mL (ref 0.35–4.50)

## 2018-10-31 LAB — VITAMIN D 25 HYDROXY (VIT D DEFICIENCY, FRACTURES): VITD: 53.56 ng/mL (ref 30.00–100.00)

## 2018-10-31 NOTE — Patient Instructions (Signed)
Description   Continue taking 1/2 tablet every day except 1 tablet on Tuesdays. Continue eating your 2 servings each week. Recheck INR in 4 weeks. [facility] # [phone].

## 2018-10-31 NOTE — Patient Instructions (Addendum)
Please stop by lab before you go If you do not have mychart- we will call you about results within 5 business days of [place] receiving them.  If you have mychart- we will send your results within 3 business days of [place] receiving them.  If abnormal or we want to clarify a result, we will call or mychart you to make sure you receive the message.  If you have questions or concerns or don't hear within 5-7 days, please send [place] a message or call us.     Glad you are doing well.

## 2018-10-31 NOTE — Progress Notes (Signed)
Phone [phone]   Subjective:  Lisa Douglas is a 85 y.o. year old very pleasant female patient who presents for/with See problem oriented charting ROS-  No chest pain or shortness of breath. No headache or worsening blurry vision-has known macular degeneration.     Past Medical History-  Patient Active Problem List   Diagnosis Date Noted  . ATRIAL FIBRILLATION, CHRONIC 10/03/2007    Priority: High  . Essential hypertension 04/27/2016    Priority: Medium  . 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 Anxiety state 10/03/2007    Priority: Medium  . Advanced nonexudative age-related macular degeneration of both eyes without subfoveal involvement 10/03/2007    Priority: Medium  . Osteoporosis 10/03/2007    Priority: Medium  . Hyperlipidemia 10/02/2007    Priority: Medium  . Angiodysplasia of colon 06/01/2004    Priority: Medium  . Aortic atherosclerosis ([place]) 10/31/2018    Priority: Low  . Epigastric hernia 12/23/2014    Priority: Low  . Diverticulitis of colon 06/12/2014    Priority: Low  . CKD (chronic kidney disease), stage II 03/26/2014    Priority: Low  . Long term current use of anticoagulant therapy 03/20/2012    Priority: Low  . Leg cramps 12/30/2011    Priority: Low  . Vitamin D deficiency 04/01/2011    Priority: Low  . VENOUS INSUFFICIENCY 10/03/2007    Priority: Low  . GERD 10/03/2007    Priority: Low  . DEGENERATIVE JOINT DISEASE 10/03/2007    Priority: Low  . Osteoarthritis of left knee 10/26/2016  . Posterior vitreous detachment, bilateral 05/25/2016  . Nuclear cataract of both eyes 11/08/2014  . Macular hole 08/31/2011    Medications- reviewed and updated Current Outpatient Medications  Medication Sig Dispense Refill  . ALPRAZolam (XANAX) 0.5 MG tablet Take 0.5 tablets (0.25 mg total) by mouth 2 (two) times daily as needed. For anxiety 20 tablet 0  . Cholecalciferol (VITAMIN D) 2000 UNITS CAPS Take 1 capsule by  mouth daily.    Marland Kitchen diltiazem (CARDIZEM CD) 180 MG 24 hr capsule Take 1 capsule (180 mg total) by mouth daily. 90 capsule 3  . esomeprazole (NEXIUM) 20 MG capsule Take 20 mg by mouth as needed (reflux).     . metoprolol succinate (TOPROL-XL) 25 MG 24 hr tablet TAKE 1 TABLET BY MOUTH ONCE DAILY 90 tablet 3  . Multiple Vitamin (MULTIVITAMIN WITH MINERALS) TABS Take 1 tablet by mouth daily.    . multivitamin-lutein (OCUVITE-LUTEIN) CAPS Take 1 capsule by mouth daily.    . simvastatin (ZOCOR) 20 MG tablet Take 1 tablet (20 mg total) by mouth at bedtime. 30 tablet 11  . warfarin (COUMADIN) 3 MG tablet Take as directed by coumadin clinic 70 tablet 1   No current facility-administered medications for this visit.      Objective:  BP 122/64 (BP Location: Left Arm, Patient Position: Sitting, Cuff Size: Normal)   Pulse 78   Temp 97.8 F (36.6 C) (Oral)   Ht 5\' 4"  (1.626 m)   Wt 153 lb 6.1 oz (69.6 kg)   SpO2 96%   BMI 26.33 kg/m  Gen: NAD, resting comfortably CV: RRR no murmurs rubs or gallops Lungs: CTAB no crackles, wheeze, rhonchi Abdomen: soft/nontender/nondistended Ext: Trace edema Skin: warm, dry Neuro: Normal gait and speech-able to get onto examination table by herself.    Assessment and Plan   # Atrial fibrillation/also with history superior mesenteric artery thrombosis- secondary indication for long term  coumadin S:rate controlled on metoprolol 25 mg XR and diltiazem 180mg  XR. Anticoagulated on coumadin through cardiology   A/P:  Stable x2. Continue current medications.     # Hypertension S:compliant with metoprolol 25 mg XR, diltiazem 180mg  XR  A/P:  Stable. Continue current medications.     # Hyperlipidemia/aortic atherosclerosis S:compliant with simvastatin 20mg .  Aortic atherosclerosis noted 01/28/15 on CT scan A/P:  Stable x2. Continue current medications.     # Osteoporosis/vitamin D deficiency S:  Patient did not tolerate fosamax. Does not want injectables like prolia  or reclast. She is on vitamin D (she opted not to take calcium). Declined bone density retest as would not want medication.  discussed importance of avoiding falls A/P: Update vitamin D today.  Declines bone density testing-suspect mildly worsening  # GERD S: very sparing use of nexium 20mg - effective when she uses.   A/P: With infrequent use I will feel strongly about updating B12 today.  We will continue to follow  # anxiety- sparing use of xanax- has not used xanax in 2020- used once in 2019.   No problem-specific Assessment & Plan notes found for this encounter.   Future Appointments  Date Time Provider [place]  10/31/2018  1:15 PM [facility]  04/03/2019  1:30 PM Burtis Junes, NP [facility]  05/08/2019  9:00 AM Marin Olp, MD [facility]  05/08/2019  9:15 AM [facility]   Return in about 6 months (around 05/03/2019) for follow up- or sooner if needed.  Lab/Order associations: Hyperlipidemia, unspecified hyperlipidemia type - Plan: Comprehensive metabolic panel, VITAMIN D 25 Hydroxy (Vit-D Deficiency, Fractures), TSH  Atrial fibrillation, unspecified type ([place])  Superior mesenteric artery thrombosis ([facility])  Essential hypertension  Aortic atherosclerosis ([facility])  Vitamin D deficiency - Plan: VITAMIN D 25 Hydroxy (Vit-D Deficiency, Fractures)  Return precautions advised.  Garret Reddish, MD

## 2018-10-31 NOTE — Addendum Note (Signed)
Addended by: Marin Olp on: 10/31/2018 09:28 AM   Modules accepted: Orders

## 2018-11-27 ENCOUNTER — Telehealth: Payer: Self-pay

## 2018-11-27 NOTE — Telephone Encounter (Signed)

## 2018-11-28 ENCOUNTER — Ambulatory Visit (INDEPENDENT_AMBULATORY_CARE_PROVIDER_SITE_OTHER): Payer: Medicare Other | Admitting: *Deleted

## 2018-11-28 ENCOUNTER — Other Ambulatory Visit: Payer: Self-pay

## 2018-11-28 DIAGNOSIS — Z7901 Long term (current) use of anticoagulants: Secondary | ICD-10-CM | POA: Diagnosis not present

## 2018-11-28 DIAGNOSIS — I4891 Unspecified atrial fibrillation: Secondary | ICD-10-CM | POA: Diagnosis not present

## 2018-11-28 DIAGNOSIS — K55069 Acute infarction of intestine, part and extent unspecified: Secondary | ICD-10-CM

## 2018-11-28 LAB — POCT INR: INR: 2.2 (ref 2.0–3.0)

## 2018-11-28 NOTE — Patient Instructions (Signed)
Description   Spoke with patient and instructed pt to continue taking 1/2 tablet every day except 1 tablet on Tuesdays. Continue eating your 2 servings each week. Recheck INR in 4 weeks. [facility] Clinic # [phone].

## 2018-12-25 ENCOUNTER — Telehealth: Payer: Self-pay

## 2018-12-25 NOTE — Telephone Encounter (Signed)

## 2018-12-26 ENCOUNTER — Other Ambulatory Visit: Payer: Self-pay

## 2018-12-26 ENCOUNTER — Ambulatory Visit (INDEPENDENT_AMBULATORY_CARE_PROVIDER_SITE_OTHER): Payer: Medicare Other | Admitting: *Deleted

## 2018-12-26 DIAGNOSIS — K55069 Acute infarction of intestine, part and extent unspecified: Secondary | ICD-10-CM | POA: Diagnosis not present

## 2018-12-26 DIAGNOSIS — Z7901 Long term (current) use of anticoagulants: Secondary | ICD-10-CM | POA: Diagnosis not present

## 2018-12-26 DIAGNOSIS — I4891 Unspecified atrial fibrillation: Secondary | ICD-10-CM | POA: Diagnosis not present

## 2018-12-26 LAB — POCT INR: INR: 2 (ref 2.0–3.0)

## 2019-01-17 ENCOUNTER — Telehealth: Payer: Self-pay | Admitting: Pharmacist

## 2019-01-17 NOTE — Telephone Encounter (Signed)
appt changed for 01/23/2019

## 2019-01-17 NOTE — Telephone Encounter (Signed)
LMOM to reschedule Coumadin appt to available appt time as no longer in drive thru.

## 2019-01-22 ENCOUNTER — Telehealth: Payer: Self-pay

## 2019-01-22 NOTE — Telephone Encounter (Signed)
lmom for prescreen  

## 2019-01-25 NOTE — Telephone Encounter (Signed)

## 2019-01-29 DIAGNOSIS — H35342 Macular cyst, hole, or pseudohole, left eye: Secondary | ICD-10-CM | POA: Diagnosis not present

## 2019-01-29 DIAGNOSIS — H52201 Unspecified astigmatism, right eye: Secondary | ICD-10-CM | POA: Diagnosis not present

## 2019-01-29 DIAGNOSIS — Z961 Presence of intraocular lens: Secondary | ICD-10-CM | POA: Diagnosis not present

## 2019-01-29 DIAGNOSIS — H04123 Dry eye syndrome of bilateral lacrimal glands: Secondary | ICD-10-CM | POA: Diagnosis not present

## 2019-01-31 ENCOUNTER — Other Ambulatory Visit: Payer: Self-pay

## 2019-01-31 ENCOUNTER — Ambulatory Visit (INDEPENDENT_AMBULATORY_CARE_PROVIDER_SITE_OTHER): Payer: Medicare Other | Admitting: *Deleted

## 2019-01-31 DIAGNOSIS — Z7901 Long term (current) use of anticoagulants: Secondary | ICD-10-CM

## 2019-01-31 DIAGNOSIS — K55069 Acute infarction of intestine, part and extent unspecified: Secondary | ICD-10-CM

## 2019-01-31 DIAGNOSIS — I4891 Unspecified atrial fibrillation: Secondary | ICD-10-CM | POA: Diagnosis not present

## 2019-01-31 LAB — POCT INR: INR: 3.1 — AB (ref 2.0–3.0)

## 2019-01-31 NOTE — Patient Instructions (Signed)
Description   Do not take any Coumadin today then continue taking 1/2 tablet every day except 1 tablet on Tuesdays. Continue eating your 2 servings each week. Recheck INR in 3 weeks. [facility] # [phone].

## 2019-02-12 ENCOUNTER — Telehealth: Payer: Self-pay

## 2019-02-12 NOTE — Telephone Encounter (Signed)

## 2019-02-19 ENCOUNTER — Ambulatory Visit (INDEPENDENT_AMBULATORY_CARE_PROVIDER_SITE_OTHER): Payer: Medicare Other | Admitting: *Deleted

## 2019-02-19 ENCOUNTER — Other Ambulatory Visit: Payer: Self-pay

## 2019-02-19 DIAGNOSIS — K55069 Acute infarction of intestine, part and extent unspecified: Secondary | ICD-10-CM | POA: Diagnosis not present

## 2019-02-19 DIAGNOSIS — Z7901 Long term (current) use of anticoagulants: Secondary | ICD-10-CM | POA: Diagnosis not present

## 2019-02-19 DIAGNOSIS — I4891 Unspecified atrial fibrillation: Secondary | ICD-10-CM | POA: Diagnosis not present

## 2019-02-19 LAB — POCT INR: INR: 1.9 — AB (ref 2.0–3.0)

## 2019-02-19 NOTE — Patient Instructions (Signed)
Description   Today take 1 tablet then continue taking 1/2 tablet every day except 1 tablet on Tuesdays. Continue eating your 2 servings each week. Recheck INR in 3 weeks. [facility] Clinic # [phone].

## 2019-03-09 DIAGNOSIS — H353133 Nonexudative age-related macular degeneration, bilateral, advanced atrophic without subfoveal involvement: Secondary | ICD-10-CM | POA: Diagnosis not present

## 2019-03-09 DIAGNOSIS — Z961 Presence of intraocular lens: Secondary | ICD-10-CM | POA: Diagnosis not present

## 2019-03-09 DIAGNOSIS — H43813 Vitreous degeneration, bilateral: Secondary | ICD-10-CM | POA: Diagnosis not present

## 2019-03-09 DIAGNOSIS — H35342 Macular cyst, hole, or pseudohole, left eye: Secondary | ICD-10-CM | POA: Diagnosis not present

## 2019-03-12 ENCOUNTER — Telehealth: Payer: Self-pay

## 2019-03-12 NOTE — Telephone Encounter (Signed)

## 2019-03-13 ENCOUNTER — Other Ambulatory Visit: Payer: Self-pay

## 2019-03-13 ENCOUNTER — Ambulatory Visit (INDEPENDENT_AMBULATORY_CARE_PROVIDER_SITE_OTHER): Payer: Medicare Other | Admitting: *Deleted

## 2019-03-13 DIAGNOSIS — K55069 Acute infarction of intestine, part and extent unspecified: Secondary | ICD-10-CM | POA: Diagnosis not present

## 2019-03-13 DIAGNOSIS — Z7901 Long term (current) use of anticoagulants: Secondary | ICD-10-CM

## 2019-03-13 LAB — POCT INR: INR: 1.9 — AB (ref 2.0–3.0)

## 2019-03-13 NOTE — Patient Instructions (Addendum)
Description   Take 1 tablet today, then change dose to 1/2 a tablet every day except 1 on Tuesday and Friday. Continue eating your 2 servings each week. Recheck INR in 3 weeks. [facility]in Clinic # [phone].

## 2019-03-27 ENCOUNTER — Ambulatory Visit: Payer: Medicare Other | Admitting: Nurse Practitioner

## 2019-04-01 NOTE — Progress Notes (Signed)
CARDIOLOGY OFFICE NOTE  Date:  04/03/2019    Lisa Douglas Date of Birth: [birth year] Medical Record #[id number]  PCP:  Marin Olp, MD  Cardiologist:  Servando Snare     Chief Complaint  Patient presents with  . Follow-up    History of Present Illness: Lisa Douglas is a 85 y.o. female who presents today for a one year check.  Former patient of Dr. Claris Gladden. She primarily follows with me.   She has a history of chronic atrial fibrillation and prior embolic SMA occlusion requiring embolectomy (off coumadin at the time). She has a history of GI bleeding from gastric AVMs.   She has stayed pretty active. She did have a fall and pneumonia a few years ago. Last seen last August of 2019 and has continued to do well.   The patient does not have symptoms concerning for COVID-19 infection (fever, chills, cough, or new shortness of breath).   Comes in today. Here alone. She continues to do ok. No chest pain. Breathing is good. Not dizzy. No falls. Needs her coumadin refilled. No palpitations. She is due to have her labs next month with her PCP. She has no real concerns. Spends her time reading/knitting and reading her bible. She notes her eyes are getting worse - she is thinking about stopping driving.   PMH: 1. Hyperlipidemia 2. Gastric AVMs with history of GI bleeding. 3. GERD 4. Atrial fibrillation: Chronic. She had suspected cardioembolic SMA occlusion in 7/13 requiring SMA embolectomy. Coumadin begun at this time.  5. Macular degeneration. 6. Diverticulosis. 7. Echo (7/13) with EF 60%, mild MR.   Past Medical History:  Diagnosis Date  . 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 Not current problem.    . Angiodysplasia of intestine (without mention of hemorrhage)   . Anxiety state, unspecified   . Atrial fibrillation ([place])   . Diverticulosis of colon (without mention of hemorrhage)   . Diverticulosis of colon  with hemorrhage 06/12/2014  . Esophageal reflux   . Macular degeneration (senile) of retina, unspecified   . 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 Osteoarthrosis, unspecified whether generalized or localized, unspecified site   . Osteoporosis, unspecified   . Other and unspecified hyperlipidemia   . Unspecified venous (peripheral) insufficiency     Past Surgical History:  Procedure Laterality Date  . APPENDECTOMY  1951  . CATARACT EXTRACTION Left 04/19/2017  . LAPAROTOMY  03/07/2012   Procedure: EXPLORATORY LAPAROTOMY;  Surgeon: Angelia Mould, MD;  [facility];  Service: Vascular;  Laterality: N/A;  Exploratory Laparotomy with Evacuation of Hematoma.  . TONSILLECTOMY  1943     Medications: Current Meds  Medication Sig  . ALPRAZolam (XANAX) 0.5 MG tablet Take 0.5 tablets (0.25 mg total) by mouth 2 (two) times daily as needed. For anxiety  . Cholecalciferol (VITAMIN D) 2000 UNITS CAPS Take 1 capsule by mouth daily.  Marland Kitchen diltiazem (CARDIZEM CD) 180 MG 24 hr capsule Take 1 capsule (180 mg total) by mouth daily.  Marland Kitchen esomeprazole (NEXIUM) 20 MG capsule Take 20 mg by mouth as needed (reflux).   . metoprolol succinate (TOPROL-XL) 25 MG 24 hr tablet TAKE 1 TABLET BY MOUTH ONCE DAILY  . Multiple Vitamin (MULTIVITAMIN WITH MINERALS) TABS Take 1 tablet by mouth daily.  . multivitamin-lutein (OCUVITE-LUTEIN) CAPS Take 1 capsule by mouth daily.  . simvastatin (ZOCOR) 20 MG tablet Take 1 tablet (20 mg total) by mouth at  bedtime.  . [DISCONTINUED] warfarin (COUMADIN) 3 MG tablet Take as directed by coumadin clinic     Allergies: Allergies  Allergen Reactions  . Penicillins Swelling    Angioedema Has patient had a PCN reaction causing immediate rash, facial/tongue/throat swelling, SOB or lightheadedness with hypotension: yes Has patient had a PCN reaction causing severe rash involving mucus membranes or skin necrosis: no Has patient had a PCN reaction that required hospitalization: no Has  patient had a PCN reaction occurring within the last 10 years: no- about 70 yrs ago If all of the above answers are "NO", then may proceed with Cephalosporin use.   . Alendronate Sodium     REACTION: INTOL to Fosamax per pt.  . Atorvastatin     REACTION: INTOL to Lipitor per pt.  . Morphine And Related     Makes her mean per pt    Social History: The patient  reports that she quit smoking about 67 years ago. Her smoking use included cigarettes. She has a 0.50 pack-year smoking history. She has never used smokeless tobacco. She reports that she does not drink alcohol or use drugs.   Family History: The patient's family history includes Breast cancer in her sister; Heart disease in her sister; Hemochromatosis in her father; Lymphoma in her sister.   Review of Systems: Please see the history of present illness.   All other systems are reviewed and negative.   Physical Exam: VS:  BP (!) 122/58 (BP Location: Left Arm, Patient Position: Sitting, Cuff Size: Normal)   Pulse 62   Wt 147 lb 6.4 oz (66.9 kg)   BMI 25.30 kg/m  .  BMI Body mass index is 25.3 kg/m.  Wt Readings from Last 3 Encounters:  04/03/19 147 lb 6.4 oz (66.9 kg)  10/31/18 153 lb 6.1 oz (69.6 kg)  05/02/18 147 lb 11.3 oz (67 kg)    General: Pleasant. Elderly. Alert and in no acute distress.   HEENT: Normal.  Neck: Supple, no JVD, carotid bruits, or masses noted.  Cardiac: Irregular irregular rhythm. Rate is ok.  Heart tones are distant.  Respiratory:  Lungs are clear to auscultation bilaterally with normal work of breathing.  GI: Soft and nontender.  MS: No deformity or atrophy. Gait and ROM intact.  Skin: Warm and dry. Color is normal.  Neuro:  Strength and sensation are intact and no gross focal deficits noted.  Psych: Alert, appropriate and with normal affect.   LABORATORY DATA:  EKG:  EKG is ordered today. This demonstrates AF with a controlled VR of 62.  Lab Results  Component Value Date   WBC 5.6  10/31/2018   HGB 14.1 10/31/2018   HCT 42.4 10/31/2018   PLT 211.0 10/31/2018   GLUCOSE 76 10/31/2018   CHOL 131 05/02/2018   TRIG 95.0 05/02/2018   HDL 57.40 05/02/2018   LDLCALC 54 05/02/2018   ALT 16 10/31/2018   AST 22 10/31/2018   NA 142 10/31/2018   K 4.6 10/31/2018   CL 104 10/31/2018   CREATININE 0.72 10/31/2018   BUN 17 10/31/2018   CO2 32 10/31/2018   TSH 2.51 10/31/2018   INR 2.6 04/03/2019   Lab Results  Component Value Date   INR 2.6 04/03/2019   INR 1.9 (A) 03/13/2019   INR 1.9 (A) 02/19/2019     BNP (last 3 results) No results for input(s): BNP in the last 8760 hours.  ProBNP (last 3 results) No results for input(s): PROBNP in the last 8760 hours.  Other Studies Reviewed Today:  Echo Study Conclusions from 02/2012  - Left ventricle: The cavity size was normal. Wall thickness was normal. The estimated ejection fraction was 60%. Wall motion was normal; there were no regional wall motion abnormalities. - Mitral valve: Mild regurgitation. - Left atrium: The atrium was mildly dilated. - Right atrium: The atrium was mildly dilated. - Pulmonary arteries: PA peak pressure: 66mm Hg (S).  Assessment/Plan:  1. Persistent AF - managed with rate control and continued anticoagulation - has had prior embolic SMA occlusion when off of Warfarin the past - would need Lovenox bridge if ever needed to stop Warfarin - her rate is ok. No changes made today.   2. History of gastric AVMs: no active bleeding noted. She is for upcoming labs with her PCP  3.HLD - on statin therapy. Marland Kitchen   4.Advanced age - still does pretty well overall.   5. COVID-19 Education: The signs and symptoms of COVID-19 were discussed with the patient and how to seek care for testing (follow up with PCP or arrange E-visit).  The importance of social distancing, staying at home, hand hygiene and wearing a mask when out in public were discussed today.  Current medicines are reviewed  with the patient today.  The patient does not have concerns regarding medicines other than what has been noted above.  The following changes have been made:  See above.  Labs/ tests ordered today include:    Orders Placed This Encounter  Procedures  . EKG 12-Lead     Disposition:   FU with me in one year.    Patient is agreeable to this plan and will call if any problems develop in the interim.   SignedTruitt Merle, NP  04/03/2019 1:58 PM  [facility] [address] [place], [place]  [address] Phone: [phone] Fax: [phone]

## 2019-04-03 ENCOUNTER — Encounter: Payer: Self-pay | Admitting: Nurse Practitioner

## 2019-04-03 ENCOUNTER — Other Ambulatory Visit: Payer: Self-pay

## 2019-04-03 ENCOUNTER — Ambulatory Visit (INDEPENDENT_AMBULATORY_CARE_PROVIDER_SITE_OTHER): Payer: Medicare Other | Admitting: *Deleted

## 2019-04-03 ENCOUNTER — Other Ambulatory Visit: Payer: Self-pay | Admitting: *Deleted

## 2019-04-03 ENCOUNTER — Ambulatory Visit (INDEPENDENT_AMBULATORY_CARE_PROVIDER_SITE_OTHER): Payer: Medicare Other | Admitting: Nurse Practitioner

## 2019-04-03 VITALS — BP 122/58 | HR 62 | Wt 147.4 lb

## 2019-04-03 DIAGNOSIS — I482 Chronic atrial fibrillation, unspecified: Secondary | ICD-10-CM

## 2019-04-03 DIAGNOSIS — K55069 Acute infarction of intestine, part and extent unspecified: Secondary | ICD-10-CM | POA: Diagnosis not present

## 2019-04-03 DIAGNOSIS — I4891 Unspecified atrial fibrillation: Secondary | ICD-10-CM | POA: Diagnosis not present

## 2019-04-03 DIAGNOSIS — Z7901 Long term (current) use of anticoagulants: Secondary | ICD-10-CM | POA: Diagnosis not present

## 2019-04-03 LAB — POCT INR: INR: 2.6 (ref 2.0–3.0)

## 2019-04-03 MED ORDER — WARFARIN SODIUM 3 MG PO TABS: ORAL_TABLET | ORAL | 0 refills | Status: DC

## 2019-04-03 NOTE — Patient Instructions (Signed)
Description   Take 1/2 a tablet today, then continue 1/2 a tablet every day except 1 on Tuesday and Friday. Continue eating your 2 servings each week. Recheck INR in 3 weeks. [facility]in [facility] # [phone].

## 2019-04-03 NOTE — Patient Instructions (Addendum)
After Visit Summary:  We will be checking the following labs today - NONE   Medication Instructions:    Continue with your current medicines.    If you need a refill on your cardiac medications before your next appointment, please call your pharmacy.     Testing/Procedures To Be Arranged:  N/A  Follow-Up:   See me in one year.     At [facility], you and your health needs are our priority.  As part of our continuing mission to provide you with exceptional heart care, we have created designated Provider Care Teams.  These Care Teams include your primary Cardiologist (physician) and Advanced Practice Providers (APPs -  Physician Assistants and Nurse Practitioners) who all work together to provide you with the care you need, when you need it.  Special Instructions:  . Stay safe, stay home, wash your hands for at least 20 seconds and wear a mask when out in public.  . It was good to talk with you today.    Call the [facility] office at [phone] if you have any questions, problems or concerns.       

## 2019-04-24 ENCOUNTER — Other Ambulatory Visit: Payer: Self-pay

## 2019-04-24 ENCOUNTER — Ambulatory Visit (INDEPENDENT_AMBULATORY_CARE_PROVIDER_SITE_OTHER): Payer: Medicare Other | Admitting: *Deleted

## 2019-04-24 DIAGNOSIS — I4891 Unspecified atrial fibrillation: Secondary | ICD-10-CM | POA: Diagnosis not present

## 2019-04-24 DIAGNOSIS — K55069 Acute infarction of intestine, part and extent unspecified: Secondary | ICD-10-CM | POA: Diagnosis not present

## 2019-04-24 DIAGNOSIS — Z7901 Long term (current) use of anticoagulants: Secondary | ICD-10-CM

## 2019-04-24 LAB — POCT INR: INR: 2.1 (ref 2.0–3.0)

## 2019-04-24 NOTE — Patient Instructions (Addendum)
Description   Continue taking 1/2 tablet everyday except 1 tablet on Tuesday and Friday. Continue eating your 2 servings each week. Recheck INR in 4 weeks. Coumadin Clinic # [phone].

## 2019-05-01 DIAGNOSIS — D0472 Carcinoma in situ of skin of left lower limb, including hip: Secondary | ICD-10-CM | POA: Diagnosis not present

## 2019-05-01 DIAGNOSIS — D485 Neoplasm of uncertain behavior of skin: Secondary | ICD-10-CM | POA: Diagnosis not present

## 2019-05-01 DIAGNOSIS — Z85820 Personal history of malignant melanoma of skin: Secondary | ICD-10-CM | POA: Diagnosis not present

## 2019-05-01 DIAGNOSIS — L821 Other seborrheic keratosis: Secondary | ICD-10-CM | POA: Diagnosis not present

## 2019-05-01 DIAGNOSIS — L814 Other melanin hyperpigmentation: Secondary | ICD-10-CM | POA: Diagnosis not present

## 2019-05-01 DIAGNOSIS — D1801 Hemangioma of skin and subcutaneous tissue: Secondary | ICD-10-CM | POA: Diagnosis not present

## 2019-05-04 ENCOUNTER — Other Ambulatory Visit: Payer: Self-pay | Admitting: Family Medicine

## 2019-05-08 ENCOUNTER — Other Ambulatory Visit: Payer: Self-pay

## 2019-05-08 ENCOUNTER — Ambulatory Visit: Payer: Medicare Other

## 2019-05-08 ENCOUNTER — Ambulatory Visit: Payer: Medicare Other | Admitting: Family Medicine

## 2019-05-08 ENCOUNTER — Ambulatory Visit (INDEPENDENT_AMBULATORY_CARE_PROVIDER_SITE_OTHER): Payer: Medicare Other

## 2019-05-08 VITALS — BP 128/62 | Temp 97.3°F | Ht 64.0 in | Wt 147.0 lb

## 2019-05-08 DIAGNOSIS — E785 Hyperlipidemia, unspecified: Secondary | ICD-10-CM

## 2019-05-08 DIAGNOSIS — Z0000 Encounter for general adult medical examination without abnormal findings: Secondary | ICD-10-CM | POA: Diagnosis not present

## 2019-05-08 DIAGNOSIS — Z23 Encounter for immunization: Secondary | ICD-10-CM | POA: Diagnosis not present

## 2019-05-08 LAB — COMPREHENSIVE METABOLIC PANEL
ALT: 16 U/L (ref 0–35)
AST: 22 U/L (ref 0–37)
Albumin: 4 g/dL (ref 3.5–5.2)
Alkaline Phosphatase: 53 U/L (ref 39–117)
BUN: 15 mg/dL (ref 6–23)
CO2: 32 mEq/L (ref 19–32)
Calcium: 9.3 mg/dL (ref 8.4–10.5)
Chloride: 102 mEq/L (ref 96–112)
Creatinine, Ser: 0.73 mg/dL (ref 0.40–1.20)
GFR: 75.04 mL/min (ref >60.00)
Glucose, Bld: 87 mg/dL (ref 70–99)
Potassium: 3.8 mEq/L (ref 3.5–5.1)
Sodium: 142 mEq/L (ref 135–145)
Total Bilirubin: 0.7 mg/dL (ref 0.2–1.2)
Total Protein: 6.2 g/dL (ref 6.0–8.3)

## 2019-05-08 LAB — CBC
HCT: 42.1 % (ref 36.0–46.0)
Hemoglobin: 14.2 g/dL (ref 12.0–15.0)
MCHC: 33.6 g/dL (ref 30.0–36.0)
MCV: 95.8 fl (ref 78.0–100.0)
Platelets: 196 10*3/uL (ref 150.0–400.0)
RBC: 4.4 Mil/uL (ref 3.87–5.11)
RDW: 14 % (ref 11.5–15.5)
WBC: 5.1 10*3/uL (ref 4.0–10.5)

## 2019-05-08 LAB — LIPID PANEL
Cholesterol: 151 mg/dL (ref 0–200)
HDL: 60.4 mg/dL (ref >39.00)
LDL Cholesterol: 69 mg/dL (ref 0–99)
NonHDL: 90.25
Total CHOL/HDL Ratio: 2
Triglycerides: 108 mg/dL (ref 0.0–149.0)
VLDL: 21.6 mg/dL (ref 0.0–40.0)

## 2019-05-08 NOTE — Progress Notes (Signed)
I have reviewed and agree with note, evaluation, plan.   Stephen Hunter, MD  

## 2019-05-08 NOTE — Patient Instructions (Addendum)
Lisa Douglas , Thank you for taking time to come for your Medicare Wellness Visit. I appreciate your ongoing commitment to your health goals. Please review the following plan we discussed and let me know if I can assist you in the future.   Screening recommendations/referrals: Colorectal Screening: not indicated  Mammogram: not indicated  Bone Density: not indicated   Vision and Dental Exams: Recommended annual ophthalmology exams for early detection of glaucoma and other disorders of the eye Recommended annual dental exams for proper oral hygiene  Vaccinations: Influenza vaccine: today  Pneumococcal vaccine: up to date; last 10/29/14 Tdap vaccine: up to date; last 03/31/10 Shingles vaccine: Please call your insurance company to determine your out of pocket expense for the Shingrix vaccine. You may receive this vaccine at your local pharmacy.  Advanced directives: Please bring a copy of your POA (Power of Attorney) and/or Living Will to your next appointment.  Goals: Recommend to drink at least 6-8 8oz glasses of water per day.  Next appointment: Please schedule your Annual Wellness Visit with your Nurse Health Advisor in one year.  Preventive Care 30 Years and Older, Female Preventive care refers to lifestyle choices and visits with your health care provider that can promote health and wellness. What does preventive care include?  A yearly physical exam. This is also called an annual well check.  Dental exams once or twice a year.  Routine eye exams. Ask your health care provider how often you should have your eyes checked.  Personal lifestyle choices, including:  Daily care of your teeth and gums.  Regular physical activity.  Eating a healthy diet.  Avoiding tobacco and drug use.  Limiting alcohol use.  Practicing safe sex.  Taking low-dose aspirin every day if recommended by your health care provider.  Taking vitamin and mineral supplements as recommended by your health  care provider. What happens during an annual well check? The services and screenings done by your health care provider during your annual well check will depend on your age, overall health, lifestyle risk factors, and family history of disease. Counseling  Your health care provider may ask you questions about your:  Alcohol use.  Tobacco use.  Drug use.  Emotional well-being.  Home and relationship well-being.  Sexual activity.  Eating habits.  History of falls.  Memory and ability to understand (cognition).  Work and work Statistician.  Reproductive health. Screening  You may have the following tests or measurements:  Height, weight, and BMI.  Blood pressure.  Lipid and cholesterol levels. These may be checked every 5 years, or more frequently if you are over 31 years old.  Skin check.  Lung cancer screening. You may have this screening every year starting at age 10 if you have a 30-pack-year history of smoking and currently smoke or have quit within the past 15 years.  Fecal occult blood test (FOBT) of the stool. You may have this test every year starting at age 30.  Flexible sigmoidoscopy or colonoscopy. You may have a sigmoidoscopy every 5 years or a colonoscopy every 10 years starting at age 99.  Hepatitis C blood test.  Hepatitis B blood test.  Sexually transmitted disease (STD) testing.  Diabetes screening. This is done by checking your blood sugar (glucose) after you have not eaten for a while (fasting). You may have this done every 1-3 years.  Bone density scan. This is done to screen for osteoporosis. You may have this done starting at age 94.  Mammogram. This may  be done every 1-2 years. Talk to your health care provider about how often you should have regular mammograms. Talk with your health care provider about your test results, treatment options, and if necessary, the need for more tests. Vaccines  Your health care provider may recommend certain  vaccines, such as:  Influenza vaccine. This is recommended every year.  Tetanus, diphtheria, and acellular pertussis (Tdap, Td) vaccine. You may need a Td booster every 10 years.  Zoster vaccine. You may need this after age 78.  Pneumococcal 13-valent conjugate (PCV13) vaccine. One dose is recommended after age 62.  Pneumococcal polysaccharide (PPSV23) vaccine. One dose is recommended after age 56. Talk to your health care provider about which screenings and vaccines you need and how often you need them. This information is not intended to replace advice given to you by your health care provider. Make sure you discuss any questions you have with your health care provider. Document Released: 09/05/2015 Document Revised: 04/28/2016 Document Reviewed: 06/10/2015 Elsevier Interactive Patient Education  2017 [place] Prevention in the Home Falls can cause injuries. They can happen to people of all ages. There are many things you can do to make your home safe and to help prevent falls. What can I do on the outside of my home?  Regularly fix the edges of walkways and driveways and fix any cracks.  Remove anything that might make you trip as you walk through a door, such as a raised step or threshold.  Trim any bushes or trees on the path to your home.  Use bright outdoor lighting.  Clear any walking paths of anything that might make someone trip, such as rocks or tools.  Regularly check to see if handrails are loose or broken. Make sure that both sides of any steps have handrails.  Any raised decks and porches should have guardrails on the edges.  Have any leaves, snow, or ice cleared regularly.  Use sand or salt on walking paths during winter.  Clean up any spills in your garage right away. This includes oil or grease spills. What can I do in the bathroom?  Use night lights.  Install grab bars by the toilet and in the tub and shower. Do not use towel bars as grab  bars.  Use non-skid mats or decals in the tub or shower.  If you need to sit down in the shower, use a plastic, non-slip stool.  Keep the floor dry. Clean up any water that spills on the floor as soon as it happens.  Remove soap buildup in the tub or shower regularly.  Attach bath mats securely with double-sided non-slip rug tape.  Do not have throw rugs and other things on the floor that can make you trip. What can I do in the bedroom?  Use night lights.  Make sure that you have a light by your bed that is easy to reach.  Do not use any sheets or blankets that are too big for your bed. They should not hang down onto the floor.  Have a firm chair that has side arms. You can use this for support while you get dressed.  Do not have throw rugs and other things on the floor that can make you trip. What can I do in the kitchen?  Clean up any spills right away.  Avoid walking on wet floors.  Keep items that you use a lot in easy-to-reach places.  If you need to reach something above you,  use a strong step stool that has a grab bar.  Keep electrical cords out of the way.  Do not use floor polish or wax that makes floors slippery. If you must use wax, use non-skid floor wax.  Do not have throw rugs and other things on the floor that can make you trip. What can I do with my stairs?  Do not leave any items on the stairs.  Make sure that there are handrails on both sides of the stairs and use them. Fix handrails that are broken or loose. Make sure that handrails are as long as the stairways.  Check any carpeting to make sure that it is firmly attached to the stairs. Fix any carpet that is loose or worn.  Avoid having throw rugs at the top or bottom of the stairs. If you do have throw rugs, attach them to the floor with carpet tape.  Make sure that you have a light switch at the top of the stairs and the bottom of the stairs. If you do not have them, ask someone to add them for  you. What else can I do to help prevent falls?  Wear shoes that:  Do not have high heels.  Have rubber bottoms.  Are comfortable and fit you well.  Are closed at the toe. Do not wear sandals.  If you use a stepladder:  Make sure that it is fully opened. Do not climb a closed stepladder.  Make sure that both sides of the stepladder are locked into place.  Ask someone to hold it for you, if possible.  Clearly mark and make sure that you can see:  Any grab bars or handrails.  First and last steps.  Where the edge of each step is.  Use tools that help you move around (mobility aids) if they are needed. These include:  Canes.  Walkers.  Scooters.  Crutches.  Turn on the lights when you go into a dark area. Replace any light bulbs as soon as they burn out.  Set up your furniture so you have a clear path. Avoid moving your furniture around.  If any of your floors are uneven, fix them.  If there are any pets around you, be aware of where they are.  Review your medicines with your doctor. Some medicines can make you feel dizzy. This can increase your chance of falling. Ask your doctor what other things that you can do to help prevent falls. This information is not intended to replace advice given to you by your health care provider. Make sure you discuss any questions you have with your health care provider. Document Released: 06/05/2009 Document Revised: 01/15/2016 Document Reviewed: 09/13/2014 Elsevier Interactive Patient Education  2017 [employer].

## 2019-05-08 NOTE — Progress Notes (Signed)
Subjective:   Lisa Douglas is a 85 y.o. female who presents for Medicare Annual (Subsequent) preventive examination.  Review of Systems:   Cardiac Risk Factors include: advanced age (>67men, >73 women);dyslipidemia;family history of premature cardiovascular disease     Objective:     Vitals: BP 128/62 (BP Location: Left Arm, Patient Position: Sitting, Cuff Size: Normal)   Temp (!) 97.3 F (36.3 C) (Temporal)   Ht 5\' 4"  (1.626 m)   Wt 147 lb (66.7 kg)   BMI 25.23 kg/m   Body mass index is 25.23 kg/m.  Advanced Directives 05/08/2019 05/02/2018 04/28/2017 06/30/2016 06/22/2015 03/03/2012 03/03/2012  Does Patient Have a Medical Advance Directive? Yes Yes Yes Yes Yes Patient does not have advance directive Patient does not have advance directive;Patient would not like information  Type of Advance Directive Living will;Healthcare Power of Attorney Living will;Healthcare Power of [place];Living will [place];Living will - -  Does patient want to make changes to medical advance directive? No - Patient declined No - Patient declined - No - Patient declined No - Patient declined - -  Copy of [place] in Chart? No - copy requested No - copy requested - No - copy requested No - copy requested - -  Would patient like information on creating a medical advance directive? - - - Yes - Educational materials given - - -  Pre-existing out of facility DNR order (yellow form or pink MOST form) - - - - - No No    Tobacco Social History   Tobacco Use  Smoking Status Former Smoker  . Packs/day: 0.25  . Years: 2.00  . Pack years: 0.50  . Types: Cigarettes  . Quit date: 08/24/1951  . Years since quitting: 67.7  Smokeless Tobacco Never Used  Tobacco Comment   smoked at age 47 for a short time--a few months     Counseling given: Not Answered Comment: smoked at age 16 for a short time--a few months   Clinical Intake:  Pre-visit  preparation completed: Yes  Diabetes: No  How often do you need to have someone help you when you read instructions, pamphlets, or other written materials from your doctor or pharmacy?: 1 - Never  Interpreter Needed?: No  Information entered by :: Cherly Anderson LPN  Past Medical History:  Diagnosis Date  . 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 Not current problem.    . Angiodysplasia of intestine (without mention of hemorrhage)   . Anxiety state, unspecified   . Atrial fibrillation ([place])   . Diverticulosis of colon (without mention of hemorrhage)   . Diverticulosis of colon with hemorrhage 06/12/2014  . Esophageal reflux   . Macular degeneration (senile) of retina, unspecified   . 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 Osteoarthrosis, unspecified whether generalized or localized, unspecified site   . Osteoporosis, unspecified   . Other and unspecified hyperlipidemia   . Unspecified venous (peripheral) insufficiency    Past Surgical History:  Procedure Laterality Date  . APPENDECTOMY  1951  . CATARACT EXTRACTION Left 04/19/2017  . LAPAROTOMY  03/07/2012   Procedure: EXPLORATORY LAPAROTOMY;  Surgeon: Angelia Mould, MD;  Location: [facility] OR;  Service: Vascular;  Laterality: N/A;  Exploratory Laparotomy with Evacuation of Hematoma.  . TONSILLECTOMY  1943   Family History  Problem Relation Age of Onset  . Hemochromatosis Father  tested in [place], denies history  . Heart disease Sister   . Breast cancer Sister        died 29, not sure when she had it  . Lymphoma Sister    Social History   Socioeconomic History  . Marital status: Single    Spouse name: Not on file  . Number of children: Not on file  . Years of education: Not on file  . Highest education level: Not on file  Occupational History  . Occupation: retired  Scientific laboratory technician  . Financial resource strain: Not on file  . Food insecurity    Worry: Not  on file    Inability: Not on file  . Transportation needs    Medical: Not on file    Non-medical: Not on file  Tobacco Use  . Smoking status: Former Smoker    Packs/day: 0.25    Years: 2.00    Pack years: 0.50    Types: Cigarettes    Quit date: 08/24/1951    Years since quitting: 67.7  . Smokeless tobacco: Never Used  . Tobacco comment: smoked at age 89 for a short time--a few months  Substance and Sexual Activity  . Alcohol use: No  . Drug use: No  . Sexual activity: Not on file  Lifestyle  . Physical activity    Days per week: Not on file    Minutes per session: Not on file  . Stress: Not on file  Relationships  . Social Herbalist on phone: Not on file    Gets together: Not on file    Attends religious service: Not on file    Active member of club or organization: Not on file    Attends meetings of clubs or organizations: Not on file    Relationship status: Not on file  Other Topics Concern  . Not on file  Social History Narrative   Born and raised in [place]. Lived on [place] of [place].    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       Lives with college age student that she helped raise [name], her niece's younger daughter, graduating may 2017)   Never married. No kids. 1 sister living (out of 8), a lot of contact with nieces and nephes.       Hobbies: used to sew and knit (vision issues now), used to bowl   Now enjoys computer time-keeps up with family, not a big tv time    Outpatient Encounter Medications as of 05/08/2019  Medication Sig  . ALPRAZolam (XANAX) 0.5 MG tablet Take 0.5 tablets (0.25 mg total) by mouth 2 (two) times daily as needed. For anxiety  . Cholecalciferol (VITAMIN D) 2000 UNITS CAPS Take 1 capsule by mouth daily.  Marland Kitchen diltiazem (CARDIZEM CD) 180 MG 24 hr capsule Take 1 capsule  (180 mg total) by mouth daily.  Marland Kitchen esomeprazole (NEXIUM) 20 MG capsule Take 20 mg by mouth as needed (reflux).   . metoprolol succinate (TOPROL-XL) 25 MG 24 hr tablet TAKE 1 TABLET BY MOUTH ONCE DAILY  . Multiple Vitamin (MULTIVITAMIN WITH MINERALS) TABS Take 1 tablet by mouth daily.  . multivitamin-lutein (OCUVITE-LUTEIN) CAPS Take 1 capsule by mouth daily.  . simvastatin (ZOCOR) 20 MG tablet TAKE 1 TABLET BY MOUTH AT BEDTIME  .  warfarin (COUMADIN) 3 MG tablet Take as directed by coumadin clinic   No facility-administered encounter medications on file as of 05/08/2019.     Activities of Daily Living In your present state of health, do you have any difficulty performing the following activities: 05/08/2019  Hearing? N  Vision? Y  Comment at times due to visual impairment  Difficulty concentrating or making decisions? N  Walking or climbing stairs? N  Dressing or bathing? N  Doing errands, shopping? N  Preparing Food and eating ? N  Using the Toilet? N  In the past six months, have you accidently leaked urine? N  Do you have problems with loss of bowel control? N  Managing your Medications? N  Managing your Finances? N  Housekeeping or managing your Housekeeping? N  Some recent data might be hidden    Patient Care Team: Marin Olp, MD as PCP - General (Family Medicine) Shon Hough, MD as Consulting Physician (Ophthalmology) Feliz Beam, MD as Referring Physician (Ophthalmology) Burtis Junes, NP as Nurse Practitioner (Nurse Practitioner)    Assessment:   This is a routine wellness examination for [facility].  Exercise Activities and Dietary recommendations Current Exercise Habits: The patient does not participate in regular exercise at present  Goals    . patient     Plans to move to smaller area. Will start checking out the community to see where you desire to live        Fall Risk Fall Risk  05/08/2019 10/31/2018 05/02/2018 04/28/2017 05/31/2016  Falls in the  past year? 0 0 No No No  Comment - - - recovered from fall last year  -  Number falls in past yr: 0 - - - -  Injury with Fall? 0 - - - -  Follow up Falls evaluation completed;Education provided - - - -   Is the patient's home free of loose throw rugs in walkways, pet beds, electrical cords, etc?   yes      Grab bars in the bathroom? yes      Handrails on the stairs?   yes      Adequate lighting?   yes  Timed Get Up and Go performed: completed and within normal timeframe; no gait abnormalities noted    Depression Screen PHQ 2/9 Scores 05/08/2019 10/31/2018 05/02/2018 05/02/2018  PHQ - 2 Score 0 0 0 0  PHQ- 9 Score - - 0 -     Cognitive Function MMSE - Mini Mental State Exam 04/28/2017  Not completed: (No Data)     6CIT Screen 05/08/2019 04/28/2017  What Year? 0 points 0 points  What month? 0 points 0 points  What time? 0 points 0 points  Count back from 20 0 points 0 points  Months in reverse 0 points 0 points  Repeat phrase 0 points 0 points  Total Score 0 0    Immunization History  Administered Date(s) Administered  . Influenza Split 05/26/2011, 06/02/2012  . Influenza Whole 05/15/2008, 05/21/2009, 05/27/2010  . Influenza, High Dose Seasonal PF 04/27/2016, 04/28/2017, 05/02/2018  . Influenza,inj,Quad PF,6+ Mos 05/09/2013, 05/23/2014, 04/29/2015  . Pneumococcal Conjugate-13 10/29/2014  . Pneumococcal Polysaccharide-23 11/03/2012  . Td 03/31/2010  . Zoster 09/24/2011    Qualifies for Shingles Vaccine?Discussed and patient will check with pharmacy for coverage.  Patient education handout provided    Screening Tests Health Maintenance  Topic Date Due  . INFLUENZA VACCINE  03/24/2019  . TETANUS/TDAP  03/31/2020  . DEXA SCAN  Completed  .  PNA vac Low Risk Adult  Completed    Cancer Screenings: Lung: Low Dose CT Chest recommended if Age 48-80 years, 30 pack-year currently smoking OR have quit w/in 15years. Patient does not qualify. Breast:  Up to date on Mammogram? Yes;  not indicated    Up to date of Bone Density/Dexa? Yes; not indicated  Colorectal: not indicated      Plan:    I have personally reviewed and addressed the Medicare Annual Wellness questionnaire and have noted the following in the patient's chart:  A. Medical and social history B. Use of alcohol, tobacco or illicit drugs  C. Current medications and supplements D. Functional ability and status E.  Nutritional status F.  Physical activity G. Advance directives H. List of other physicians I.  Hospitalizations, surgeries, and ER visits in previous 12 months J.  Admire such as hearing and vision if needed, cognitive and depression L. Referrals, records requested, and appointments-  None   In addition, I have reviewed and discussed with patient certain preventive protocols, quality metrics, and best practice recommendations. A written personalized care plan for preventive services as well as general preventive health recommendations were provided to patient.   Signed,  Denman George, LPN  Nurse Health Advisor   Nurse Notes: no additional

## 2019-05-08 NOTE — Addendum Note (Signed)
Addended by: Francis Dowse T on: 05/08/2019 09:50 AM   Modules accepted: Orders

## 2019-05-22 ENCOUNTER — Other Ambulatory Visit: Payer: Self-pay

## 2019-05-22 ENCOUNTER — Ambulatory Visit (INDEPENDENT_AMBULATORY_CARE_PROVIDER_SITE_OTHER): Payer: Medicare Other | Admitting: *Deleted

## 2019-05-22 DIAGNOSIS — K55069 Acute infarction of intestine, part and extent unspecified: Secondary | ICD-10-CM

## 2019-05-22 DIAGNOSIS — Z7901 Long term (current) use of anticoagulants: Secondary | ICD-10-CM

## 2019-05-22 LAB — POCT INR: INR: 2.6 (ref 2.0–3.0)

## 2019-05-22 NOTE — Patient Instructions (Addendum)
Description   Continue taking 1/2 tablet everyday except 1 tablet on Tuesday and Friday. Eat an extra serving of greens today. Recheck INR in 3 weeks. Coumadin Clinic # [phone], [phone]

## 2019-05-27 ENCOUNTER — Other Ambulatory Visit: Payer: Self-pay | Admitting: Nurse Practitioner

## 2019-06-13 ENCOUNTER — Other Ambulatory Visit: Payer: Self-pay

## 2019-06-13 ENCOUNTER — Ambulatory Visit (INDEPENDENT_AMBULATORY_CARE_PROVIDER_SITE_OTHER): Payer: Medicare Other | Admitting: Pharmacist Clinician (PhC)/ Clinical Pharmacy Specialist

## 2019-06-13 DIAGNOSIS — Z7901 Long term (current) use of anticoagulants: Secondary | ICD-10-CM | POA: Diagnosis not present

## 2019-06-13 DIAGNOSIS — I4891 Unspecified atrial fibrillation: Secondary | ICD-10-CM

## 2019-06-13 DIAGNOSIS — K55069 Acute infarction of intestine, part and extent unspecified: Secondary | ICD-10-CM

## 2019-06-13 LAB — POCT INR: INR: 2.6 (ref 2.0–3.0)

## 2019-07-11 ENCOUNTER — Other Ambulatory Visit: Payer: Self-pay

## 2019-07-11 ENCOUNTER — Ambulatory Visit (INDEPENDENT_AMBULATORY_CARE_PROVIDER_SITE_OTHER): Payer: Medicare Other | Admitting: Pharmacist Clinician (PhC)/ Clinical Pharmacy Specialist

## 2019-07-11 DIAGNOSIS — Z7901 Long term (current) use of anticoagulants: Secondary | ICD-10-CM

## 2019-07-11 DIAGNOSIS — I4891 Unspecified atrial fibrillation: Secondary | ICD-10-CM

## 2019-07-11 DIAGNOSIS — K55069 Acute infarction of intestine, part and extent unspecified: Secondary | ICD-10-CM | POA: Diagnosis not present

## 2019-07-11 LAB — POCT INR: INR: 2.5 (ref 2.0–3.0)

## 2019-07-27 ENCOUNTER — Other Ambulatory Visit: Payer: Self-pay | Admitting: Nurse Practitioner

## 2019-07-27 ENCOUNTER — Other Ambulatory Visit: Payer: Self-pay | Admitting: Family Medicine

## 2019-08-08 ENCOUNTER — Ambulatory Visit (INDEPENDENT_AMBULATORY_CARE_PROVIDER_SITE_OTHER): Payer: Medicare Other | Admitting: Pharmacist Clinician (PhC)/ Clinical Pharmacy Specialist

## 2019-08-08 ENCOUNTER — Other Ambulatory Visit: Payer: Self-pay

## 2019-08-08 DIAGNOSIS — Z7901 Long term (current) use of anticoagulants: Secondary | ICD-10-CM

## 2019-08-08 DIAGNOSIS — I4891 Unspecified atrial fibrillation: Secondary | ICD-10-CM | POA: Diagnosis not present

## 2019-08-08 DIAGNOSIS — K55069 Acute infarction of intestine, part and extent unspecified: Secondary | ICD-10-CM

## 2019-08-08 LAB — POCT INR: INR: 2.7 (ref 2.0–3.0)

## 2019-08-08 NOTE — Patient Instructions (Signed)
No warfarin today Wednesday Dec 16, the continue taking 1/2 tablet everyday except 1 tablet on Tuesday and Friday.  Recheck INR in 4 weeks. Coumadin Clinic # [phone]

## 2019-09-05 ENCOUNTER — Other Ambulatory Visit: Payer: Self-pay

## 2019-09-05 ENCOUNTER — Encounter: Payer: Self-pay | Admitting: Pharmacist Clinician (PhC)/ Clinical Pharmacy Specialist

## 2019-09-05 ENCOUNTER — Ambulatory Visit (INDEPENDENT_AMBULATORY_CARE_PROVIDER_SITE_OTHER): Payer: Medicare Other | Admitting: Pharmacist Clinician (PhC)/ Clinical Pharmacy Specialist

## 2019-09-05 DIAGNOSIS — I4891 Unspecified atrial fibrillation: Secondary | ICD-10-CM

## 2019-09-05 DIAGNOSIS — Z7901 Long term (current) use of anticoagulants: Secondary | ICD-10-CM

## 2019-09-05 DIAGNOSIS — K55069 Acute infarction of intestine, part and extent unspecified: Secondary | ICD-10-CM | POA: Diagnosis not present

## 2019-09-05 LAB — POCT INR: INR: 2.6 (ref 2.0–3.0)

## 2019-09-21 ENCOUNTER — Ambulatory Visit: Payer: Medicare Other

## 2019-09-28 ENCOUNTER — Ambulatory Visit (INDEPENDENT_AMBULATORY_CARE_PROVIDER_SITE_OTHER): Payer: Medicare Other | Admitting: Pharmacist

## 2019-09-28 ENCOUNTER — Other Ambulatory Visit: Payer: Self-pay

## 2019-09-28 DIAGNOSIS — K55069 Acute infarction of intestine, part and extent unspecified: Secondary | ICD-10-CM | POA: Diagnosis not present

## 2019-09-28 DIAGNOSIS — Z7901 Long term (current) use of anticoagulants: Secondary | ICD-10-CM | POA: Diagnosis not present

## 2019-09-28 LAB — POCT INR: INR: 2.5 (ref 2.0–3.0)

## 2019-10-29 ENCOUNTER — Other Ambulatory Visit: Payer: Self-pay

## 2019-10-29 ENCOUNTER — Ambulatory Visit (INDEPENDENT_AMBULATORY_CARE_PROVIDER_SITE_OTHER): Payer: Medicare Other | Admitting: Pharmacist Clinician (PhC)/ Clinical Pharmacy Specialist

## 2019-10-29 DIAGNOSIS — I4891 Unspecified atrial fibrillation: Secondary | ICD-10-CM

## 2019-10-29 DIAGNOSIS — K55069 Acute infarction of intestine, part and extent unspecified: Secondary | ICD-10-CM

## 2019-10-29 DIAGNOSIS — Z7901 Long term (current) use of anticoagulants: Secondary | ICD-10-CM | POA: Diagnosis not present

## 2019-10-29 LAB — POCT INR: INR: 1.7 — AB (ref 2.0–3.0)

## 2019-11-05 ENCOUNTER — Other Ambulatory Visit: Payer: Self-pay | Admitting: Family Medicine

## 2019-11-13 DIAGNOSIS — H353133 Nonexudative age-related macular degeneration, bilateral, advanced atrophic without subfoveal involvement: Secondary | ICD-10-CM | POA: Diagnosis not present

## 2019-11-13 DIAGNOSIS — H43813 Vitreous degeneration, bilateral: Secondary | ICD-10-CM | POA: Diagnosis not present

## 2019-11-13 DIAGNOSIS — Z961 Presence of intraocular lens: Secondary | ICD-10-CM | POA: Diagnosis not present

## 2019-11-13 DIAGNOSIS — H35342 Macular cyst, hole, or pseudohole, left eye: Secondary | ICD-10-CM | POA: Diagnosis not present

## 2019-11-21 ENCOUNTER — Other Ambulatory Visit: Payer: Self-pay

## 2019-11-21 ENCOUNTER — Ambulatory Visit (INDEPENDENT_AMBULATORY_CARE_PROVIDER_SITE_OTHER): Payer: Medicare Other | Admitting: Pharmacist

## 2019-11-21 DIAGNOSIS — Z7901 Long term (current) use of anticoagulants: Secondary | ICD-10-CM

## 2019-11-21 DIAGNOSIS — I4891 Unspecified atrial fibrillation: Secondary | ICD-10-CM

## 2019-11-21 DIAGNOSIS — K55069 Acute infarction of intestine, part and extent unspecified: Secondary | ICD-10-CM | POA: Diagnosis not present

## 2019-11-21 LAB — POCT INR: INR: 2.2 (ref 2.0–3.0)

## 2019-11-22 DIAGNOSIS — M25562 Pain in left knee: Secondary | ICD-10-CM | POA: Diagnosis not present

## 2019-11-25 ENCOUNTER — Other Ambulatory Visit: Payer: Self-pay | Admitting: Nurse Practitioner

## 2019-12-19 ENCOUNTER — Ambulatory Visit (INDEPENDENT_AMBULATORY_CARE_PROVIDER_SITE_OTHER): Payer: Medicare Other | Admitting: Pharmacist Clinician (PhC)/ Clinical Pharmacy Specialist

## 2019-12-19 ENCOUNTER — Other Ambulatory Visit: Payer: Self-pay

## 2019-12-19 DIAGNOSIS — I4891 Unspecified atrial fibrillation: Secondary | ICD-10-CM | POA: Diagnosis not present

## 2019-12-19 DIAGNOSIS — Z7901 Long term (current) use of anticoagulants: Secondary | ICD-10-CM

## 2019-12-19 DIAGNOSIS — K55069 Acute infarction of intestine, part and extent unspecified: Secondary | ICD-10-CM | POA: Diagnosis not present

## 2019-12-19 LAB — POCT INR: INR: 2.2 (ref 2.0–3.0)

## 2020-01-16 ENCOUNTER — Ambulatory Visit (INDEPENDENT_AMBULATORY_CARE_PROVIDER_SITE_OTHER): Payer: Medicare Other | Admitting: Pharmacist

## 2020-01-16 ENCOUNTER — Other Ambulatory Visit: Payer: Self-pay

## 2020-01-16 DIAGNOSIS — I4891 Unspecified atrial fibrillation: Secondary | ICD-10-CM | POA: Diagnosis not present

## 2020-01-16 DIAGNOSIS — K55069 Acute infarction of intestine, part and extent unspecified: Secondary | ICD-10-CM

## 2020-01-16 DIAGNOSIS — Z7901 Long term (current) use of anticoagulants: Secondary | ICD-10-CM

## 2020-01-16 LAB — POCT INR: INR: 1.9 — AB (ref 2.0–3.0)

## 2020-01-16 NOTE — Patient Instructions (Signed)
Description   Take 1 tablet today, then continue 1/2 tablet everyday except 1 tablet on Tuesday.  Recheck INR in 4 weeks. Coumadin Clinic # [phone] (prefers 4 weeks between visits)

## 2020-01-31 DIAGNOSIS — H52201 Unspecified astigmatism, right eye: Secondary | ICD-10-CM | POA: Diagnosis not present

## 2020-01-31 DIAGNOSIS — H35342 Macular cyst, hole, or pseudohole, left eye: Secondary | ICD-10-CM | POA: Diagnosis not present

## 2020-01-31 DIAGNOSIS — H353133 Nonexudative age-related macular degeneration, bilateral, advanced atrophic without subfoveal involvement: Secondary | ICD-10-CM | POA: Diagnosis not present

## 2020-01-31 DIAGNOSIS — H04123 Dry eye syndrome of bilateral lacrimal glands: Secondary | ICD-10-CM | POA: Diagnosis not present

## 2020-02-05 ENCOUNTER — Other Ambulatory Visit: Payer: Self-pay | Admitting: Family Medicine

## 2020-02-13 ENCOUNTER — Other Ambulatory Visit: Payer: Self-pay

## 2020-02-13 ENCOUNTER — Ambulatory Visit (INDEPENDENT_AMBULATORY_CARE_PROVIDER_SITE_OTHER): Payer: Medicare Other | Admitting: Pharmacist

## 2020-02-13 DIAGNOSIS — I4891 Unspecified atrial fibrillation: Secondary | ICD-10-CM | POA: Diagnosis not present

## 2020-02-13 DIAGNOSIS — Z7901 Long term (current) use of anticoagulants: Secondary | ICD-10-CM

## 2020-02-13 DIAGNOSIS — K55069 Acute infarction of intestine, part and extent unspecified: Secondary | ICD-10-CM | POA: Diagnosis not present

## 2020-02-13 LAB — POCT INR: INR: 2.1 (ref 2.0–3.0)

## 2020-03-12 ENCOUNTER — Ambulatory Visit (INDEPENDENT_AMBULATORY_CARE_PROVIDER_SITE_OTHER): Payer: Medicare Other

## 2020-03-12 ENCOUNTER — Other Ambulatory Visit: Payer: Self-pay

## 2020-03-12 DIAGNOSIS — Z5181 Encounter for therapeutic drug level monitoring: Secondary | ICD-10-CM

## 2020-03-12 DIAGNOSIS — K55069 Acute infarction of intestine, part and extent unspecified: Secondary | ICD-10-CM | POA: Diagnosis not present

## 2020-03-12 DIAGNOSIS — I4891 Unspecified atrial fibrillation: Secondary | ICD-10-CM

## 2020-03-12 DIAGNOSIS — Z7901 Long term (current) use of anticoagulants: Secondary | ICD-10-CM

## 2020-03-12 LAB — POCT INR: INR: 1.7 — AB (ref 2.0–3.0)

## 2020-03-12 NOTE — Patient Instructions (Signed)
Take 1 tablet today and then continue 1/2 tablet everyday except 1 tablet on Tuesday.  Recheck INR in 3 weeks. Coumadin Clinic # [phone] (prefers 4 weeks between visits)

## 2020-03-17 ENCOUNTER — Other Ambulatory Visit: Payer: Self-pay | Admitting: Nurse Practitioner

## 2020-03-19 NOTE — Progress Notes (Addendum)
CARDIOLOGY OFFICE NOTE  Date:  04/02/2020    Lisa Douglas Date of Birth: April 20, 1928 Medical Record #[id number]  PCP:  Marin Olp, MD  Cardiologist:  Servando Snare     Chief Complaint  Patient presents with  . Follow-up    History of Present Illness: Lisa Douglas is a 86 y.o. female who presents today for a one year check. Former patient of Dr. Claris Gladden. She primarily follows with me.  She has a history of chronic atrial fibrillation and prior embolic SMA occlusion requiring embolectomy (off coumadin at the time). She has a history of GI bleeding from gastric AVMs.   She has stayed pretty active. She did have a fall and pneumonia a few years ago. Last seen last August of 2020 and was doing ok. Doing well with Coumadin. Eyes were getting worse and she was considering stopping driving.   Comes in today. Here with her niece Lisa Douglas today - it is unusual that she has someone with her. She has stopped driving. Her eyesight has gotten worse - her nieces are now providing transportation for her. Otherwise, doing good. No falls. No chest pain. Not short of breath unless she really over exerts. She has some chronic swelling of her feet - does go down overnight. Support stockings are uncomfortable for her. Overall she feels like she is doing ok.   Past Medical History:  Diagnosis Date  . 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 Not current problem.    . Angiodysplasia of intestine (without mention of hemorrhage)   . Anxiety state, unspecified   . Atrial fibrillation ([place])   . Diverticulosis of colon (without mention of hemorrhage)   . Diverticulosis of colon with hemorrhage 06/12/2014  . Esophageal reflux   . Macular degeneration (senile) of retina, unspecified   . 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 Osteoarthrosis, unspecified whether generalized or localized, unspecified site   . Osteoporosis, unspecified   . Other and  unspecified hyperlipidemia   . Unspecified venous (peripheral) insufficiency     Past Surgical History:  Procedure Laterality Date  . APPENDECTOMY  1951  . CATARACT EXTRACTION Left 04/19/2017  . LAPAROTOMY  03/07/2012   Procedure: EXPLORATORY LAPAROTOMY;  Surgeon: Angelia Mould, MD;  Location: [facility] OR;  Service: Vascular;  Laterality: N/A;  Exploratory Laparotomy with Evacuation of Hematoma.  . TONSILLECTOMY  1943     Medications: Current Meds  Medication Sig  . ALPRAZolam (XANAX) 0.5 MG tablet Take 0.5 tablets (0.25 mg total) by mouth 2 (two) times daily as needed. For anxiety  . Cholecalciferol (VITAMIN D) 2000 UNITS CAPS Take 1 capsule by mouth daily.  Marland Kitchen diltiazem (CARDIZEM CD) 180 MG 24 hr capsule Take 1 capsule by mouth once daily  . esomeprazole (NEXIUM) 20 MG capsule Take 20 mg by mouth as needed (reflux).   . metoprolol succinate (TOPROL-XL) 25 MG 24 hr tablet Take 1 tablet by mouth once daily  . Multiple Vitamin (MULTIVITAMIN WITH MINERALS) TABS Take 1 tablet by mouth daily.  . Multiple Vitamins-Minerals (PRESERVISION AREDS) CAPS Take 1 capsule by mouth in the morning and at bedtime.  . simvastatin (ZOCOR) 20 MG tablet TAKE 1 TABLET BY MOUTH AT BEDTIME  . warfarin (COUMADIN) 3 MG tablet TAKE AS DIRECTED BY COUMADIN CLINIC     Allergies: Allergies  Allergen Reactions  . Penicillins Swelling    Angioedema Has patient had a PCN reaction causing immediate rash, facial/tongue/throat  swelling, SOB or lightheadedness with hypotension: yes Has patient had a PCN reaction causing severe rash involving mucus membranes or skin necrosis: no Has patient had a PCN reaction that required hospitalization: no Has patient had a PCN reaction occurring within the last 10 years: no- about 70 yrs ago If all of the above answers are "NO", then may proceed with Cephalosporin use.   . Alendronate Sodium     REACTION: INTOL to Fosamax per pt.  . Atorvastatin     REACTION: INTOL to  Lipitor per pt.  . Morphine And Related     Makes her mean per pt    Social History: The patient  reports that she quit smoking about 68 years ago. Her smoking use included cigarettes. She has a 0.50 pack-year smoking history. She has never used smokeless tobacco. She reports that she does not drink alcohol and does not use drugs.   Family History: The patient's family history includes Breast cancer in her sister; Heart disease in her sister; Hemochromatosis in her father; Lymphoma in her sister.   Review of Systems: Please see the history of present illness.   All other systems are reviewed and negative.   Physical Exam: VS:  BP 126/72 (BP Location: Left Arm, Patient Position: Sitting, Cuff Size: Large)   Pulse 81   Wt 148 lb 6.4 oz (67.3 kg)   BMI 25.47 kg/m  .  BMI Body mass index is 25.47 kg/m.  Wt Readings from Last 3 Encounters:  04/02/20 148 lb 6.4 oz (67.3 kg)  05/08/19 147 lb (66.7 kg)  04/03/19 147 lb 6.4 oz (66.9 kg)    General: Pleasant. Alert and in no acute distress. She does seem a little more frail today.   Cardiac: Irregular irregular rhythm. Her rate is fine.  Trace edema - worse on the right - this is chronic.  Respiratory:  Lungs are clear to auscultation bilaterally with normal work of breathing.  GI: Soft and nontender.  MS: No deformity or atrophy. Gait and ROM intact.  Skin: Warm and dry. Color is normal.  Neuro:  Strength and sensation are intact and no gross focal deficits noted.  Psych: Alert, appropriate and with normal affect.   LABORATORY DATA:  EKG:  EKG is not ordered today.  Personally reviewed by me. This demonstrates AF with controlled VR - HR is 81.  Lab Results  Component Value Date   WBC 5.1 05/08/2019   HGB 14.2 05/08/2019   HCT 42.1 05/08/2019   PLT 196.0 05/08/2019   GLUCOSE 87 05/08/2019   CHOL 151 05/08/2019   TRIG 108.0 05/08/2019   HDL 60.40 05/08/2019   LDLCALC 69 05/08/2019   ALT 16 05/08/2019   AST 22 05/08/2019    NA 142 05/08/2019   K 3.8 05/08/2019   CL 102 05/08/2019   CREATININE 0.73 05/08/2019   BUN 15 05/08/2019   CO2 32 05/08/2019   TSH 2.51 10/31/2018   INR 2.4 04/02/2020     BNP (last 3 results) No results for input(s): BNP in the last 8760 hours.  ProBNP (last 3 results) No results for input(s): PROBNP in the last 8760 hours.   Other Studies Reviewed Today:  Echo Study Conclusions from 02/2012  - Left ventricle: The cavity size was normal. Wall thickness was normal. The estimated ejection fraction was 60%. Wall motion was normal; there were no regional wall motion abnormalities. - Mitral valve: Mild regurgitation. - Left atrium: The atrium was mildly dilated. - Right atrium: The  atrium was mildly dilated. - Pulmonary arteries: PA peak pressure: 69mm Hg (S).   Assessment/Plan:  1. Persistent AF - managed with rate control and continued anticoagulation - her rate is good. She has no problems with her coumadin.   2. Chronic anticoagulation - no problems noted - needs surveillance labs today. No falls.   3. Remote history of gastric AVMs - no active bleeding noted. Lab today.   4. HLD - remains on statin - lipids today.   5. Advanced age - she has stopped driving. But otherwise, doing ok.   Current medicines are reviewed with the patient today.  The patient does not have concerns regarding medicines other than what has been noted above.  The following changes have been made:  See above.  Labs/ tests ordered today include:    Orders Placed This Encounter  Procedures  . Basic metabolic panel  . CBC  . Hepatic function panel  . Lipid panel  . TSH  . EKG 12-Lead     Disposition:   FU with [place] in one year. INR today along with surveillance labs. I offered to see her back in 6 months but she opted for 1 year.     Patient is agreeable to this plan and will call if any problems develop in the interim.   SignedTruitt Merle, NP  04/02/2020 1:59  PM  [facility] [address] [place], [place]  [address] Phone: [phone] Fax: [phone]

## 2020-04-02 ENCOUNTER — Other Ambulatory Visit: Payer: Self-pay

## 2020-04-02 ENCOUNTER — Encounter: Payer: Self-pay | Admitting: Nurse Practitioner

## 2020-04-02 ENCOUNTER — Ambulatory Visit (INDEPENDENT_AMBULATORY_CARE_PROVIDER_SITE_OTHER): Payer: Medicare Other | Admitting: *Deleted

## 2020-04-02 ENCOUNTER — Ambulatory Visit (INDEPENDENT_AMBULATORY_CARE_PROVIDER_SITE_OTHER): Payer: Medicare Other | Admitting: Nurse Practitioner

## 2020-04-02 VITALS — BP 126/72 | HR 81 | Wt 148.4 lb

## 2020-04-02 DIAGNOSIS — I4891 Unspecified atrial fibrillation: Secondary | ICD-10-CM

## 2020-04-02 DIAGNOSIS — Z7901 Long term (current) use of anticoagulants: Secondary | ICD-10-CM

## 2020-04-02 DIAGNOSIS — K55069 Acute infarction of intestine, part and extent unspecified: Secondary | ICD-10-CM | POA: Diagnosis not present

## 2020-04-02 DIAGNOSIS — E782 Mixed hyperlipidemia: Secondary | ICD-10-CM

## 2020-04-02 DIAGNOSIS — I482 Chronic atrial fibrillation, unspecified: Secondary | ICD-10-CM

## 2020-04-02 LAB — POCT INR: INR: 2.4 (ref 2.0–3.0)

## 2020-04-02 NOTE — Patient Instructions (Addendum)
After Visit Summary:  We will be checking the following labs today - BMET, CBC, HPF and Lipids  INR today.   Medication Instructions:    Continue with your current medicines.    If you need a refill on your cardiac medications before your next appointment, please call your pharmacy.     Testing/Procedures To Be Arranged:  N/A  Follow-Up:   See [place] back in one year.     At [facility], you and your health needs are our priority.  As part of our continuing mission to provide you with exceptional heart care, we have created designated Provider Care Teams.  These Care Teams include your primary Cardiologist (physician) and Advanced Practice Providers (APPs -  Physician Assistants and Nurse Practitioners) who all work together to provide you with the care you need, when you need it.  Special Instructions:  . Stay safe, wash your hands for at least 20 seconds and wear a mask when needed.  . It was good to talk with you today.    Call the [place] office at [phone] if you have any questions, problems or concerns.

## 2020-04-02 NOTE — Patient Instructions (Signed)
Description   Continue taking Warfarin 1/2 tablet everyday except 1 tablet on Tuesday.  Recheck INR in 4 weeks. Coumadin Clinic # [phone] (prefers 4 weeks between visits)        

## 2020-04-03 LAB — LIPID PANEL
Chol/HDL Ratio: 2.3 ratio (ref 0.0–4.4)
Cholesterol, Total: 131 mg/dL (ref 100–199)
HDL: 56 mg/dL (ref >39)
LDL Chol Calc (NIH): 55 mg/dL (ref 0–99)
Triglycerides: 114 mg/dL (ref 0–149)
VLDL Cholesterol Cal: 20 mg/dL (ref 5–40)

## 2020-04-03 LAB — HEPATIC FUNCTION PANEL
ALT: 16 IU/L (ref 0–32)
AST: 25 IU/L (ref 0–40)
Albumin: 4.1 g/dL (ref 3.5–4.6)
Alkaline Phosphatase: 68 IU/L (ref 48–121)
Bilirubin Total: 0.5 mg/dL (ref 0.0–1.2)
Bilirubin, Direct: 0.19 mg/dL (ref 0.00–0.40)
Total Protein: 6.3 g/dL (ref 6.0–8.5)

## 2020-04-03 LAB — BASIC METABOLIC PANEL
BUN/Creatinine Ratio: 16 (ref 12–28)
BUN: 11 mg/dL (ref 10–36)
CO2: 28 mmol/L (ref 20–29)
Calcium: 9.3 mg/dL (ref 8.7–10.3)
Chloride: 103 mmol/L (ref 96–106)
Creatinine, Ser: 0.69 mg/dL (ref 0.57–1.00)
GFR calc Af Amer: 89 mL/min/{1.73_m2} (ref >59)
GFR calc non Af Amer: 77 mL/min/{1.73_m2} (ref >59)
Glucose: 90 mg/dL (ref 65–99)
Potassium: 4.5 mmol/L (ref 3.5–5.2)
Sodium: 142 mmol/L (ref 134–144)

## 2020-04-03 LAB — CBC
Hematocrit: 39.2 % (ref 34.0–46.6)
Hemoglobin: 13.1 g/dL (ref 11.1–15.9)
MCH: 32 pg (ref 26.6–33.0)
MCHC: 33.4 g/dL (ref 31.5–35.7)
MCV: 96 fL (ref 79–97)
Platelets: 206 10*3/uL (ref 150–450)
RBC: 4.1 x10E6/uL (ref 3.77–5.28)
RDW: 11.8 % (ref 11.7–15.4)
WBC: 6.5 10*3/uL (ref 3.4–10.8)

## 2020-04-03 LAB — TSH: TSH: 2.79 u[IU]/mL (ref 0.450–4.500)

## 2020-04-30 ENCOUNTER — Other Ambulatory Visit: Payer: Self-pay

## 2020-04-30 ENCOUNTER — Ambulatory Visit (INDEPENDENT_AMBULATORY_CARE_PROVIDER_SITE_OTHER): Payer: Medicare Other

## 2020-04-30 DIAGNOSIS — I4891 Unspecified atrial fibrillation: Secondary | ICD-10-CM | POA: Diagnosis not present

## 2020-04-30 DIAGNOSIS — K55069 Acute infarction of intestine, part and extent unspecified: Secondary | ICD-10-CM

## 2020-04-30 DIAGNOSIS — Z7901 Long term (current) use of anticoagulants: Secondary | ICD-10-CM

## 2020-04-30 DIAGNOSIS — M25562 Pain in left knee: Secondary | ICD-10-CM | POA: Diagnosis not present

## 2020-04-30 LAB — POCT INR: INR: 2 (ref 2.0–3.0)

## 2020-04-30 NOTE — Patient Instructions (Signed)
Continue taking Warfarin 1/2 tablet everyday except 1 tablet on Tuesday.  Recheck INR in 4 weeks. Coumadin Clinic # [phone] (prefers 4 weeks between visits)     

## 2020-05-01 DIAGNOSIS — D692 Other nonthrombocytopenic purpura: Secondary | ICD-10-CM | POA: Diagnosis not present

## 2020-05-01 DIAGNOSIS — Z85828 Personal history of other malignant neoplasm of skin: Secondary | ICD-10-CM | POA: Diagnosis not present

## 2020-05-01 DIAGNOSIS — Z85820 Personal history of malignant melanoma of skin: Secondary | ICD-10-CM | POA: Diagnosis not present

## 2020-05-01 DIAGNOSIS — L821 Other seborrheic keratosis: Secondary | ICD-10-CM | POA: Diagnosis not present

## 2020-05-08 ENCOUNTER — Other Ambulatory Visit: Payer: Self-pay | Admitting: Family Medicine

## 2020-05-19 ENCOUNTER — Other Ambulatory Visit: Payer: Self-pay | Admitting: Nurse Practitioner

## 2020-05-28 ENCOUNTER — Other Ambulatory Visit: Payer: Self-pay

## 2020-05-28 ENCOUNTER — Ambulatory Visit (INDEPENDENT_AMBULATORY_CARE_PROVIDER_SITE_OTHER): Payer: Medicare Other

## 2020-05-28 DIAGNOSIS — Z7901 Long term (current) use of anticoagulants: Secondary | ICD-10-CM

## 2020-05-28 DIAGNOSIS — I4891 Unspecified atrial fibrillation: Secondary | ICD-10-CM | POA: Diagnosis not present

## 2020-05-28 DIAGNOSIS — K55069 Acute infarction of intestine, part and extent unspecified: Secondary | ICD-10-CM | POA: Diagnosis not present

## 2020-05-28 LAB — POCT INR: INR: 2 (ref 2.0–3.0)

## 2020-05-28 NOTE — Patient Instructions (Signed)
Continue taking Warfarin 1/2 tablet everyday except 1 tablet on Tuesday.  Recheck INR in 4 weeks. Coumadin Clinic # [phone] (prefers 4 weeks between visits)     

## 2020-06-03 NOTE — Progress Notes (Signed)
Phone [phone] In person visit   Subjective:   Lisa Douglas is a 86 y.o. year old very pleasant female patient who presents for/with See problem oriented charting Chief Complaint  Patient presents with  . Medication Refill   This visit occurred during the SARS-CoV-2 public health emergency.  Safety protocols were in place, including screening questions prior to the visit, additional usage of staff PPE, and extensive cleaning of exam room while observing appropriate contact time as indicated for disinfecting solutions.   Past Medical History-  Patient Active Problem List   Diagnosis Date Noted  . ATRIAL FIBRILLATION, CHRONIC 10/03/2007    Priority: High  . Essential hypertension 04/27/2016    Priority: Medium  . Superior mesenteric artery thrombosis ([employer]) 03/03/2012    Priority: Medium  . Anxiety state 10/03/2007    Priority: Medium  . Advanced nonexudative age-related macular degeneration of both eyes without subfoveal involvement 10/03/2007    Priority: Medium  . Osteoporosis 10/03/2007    Priority: Medium  . Hyperlipidemia 10/02/2007    Priority: Medium  . Angiodysplasia of colon 06/01/2004    Priority: Medium  . Aortic atherosclerosis ([place]) 10/31/2018    Priority: Low  . Epigastric hernia 12/23/2014    Priority: Low  . Diverticulitis of colon 06/12/2014    Priority: Low  . CKD (chronic kidney disease), stage II 03/26/2014    Priority: Low  . Long term current use of anticoagulant therapy 03/20/2012    Priority: Low  . Leg cramps 12/30/2011    Priority: Low  . Vitamin D deficiency 04/01/2011    Priority: Low  . VENOUS INSUFFICIENCY 10/03/2007    Priority: Low  . GERD 10/03/2007    Priority: Low  . DEGENERATIVE JOINT DISEASE 10/03/2007    Priority: Low  . Pseudophakia of both eyes 01/26/2018  . Osteoarthritis of left knee 10/26/2016  . Posterior vitreous detachment, bilateral 05/25/2016  . Nuclear cataract of both eyes 11/08/2014  . Macular hole  08/31/2011    Medications- reviewed and updated Current Outpatient Medications  Medication Sig Dispense Refill  . Cholecalciferol (VITAMIN D) 2000 UNITS CAPS Take 1 capsule by mouth daily.    Marland Kitchen diltiazem (CARDIZEM CD) 180 MG 24 hr capsule Take 1 capsule by mouth once daily 90 capsule 3  . esomeprazole (NEXIUM) 20 MG capsule Take 20 mg by mouth as needed (reflux).     . metoprolol succinate (TOPROL-XL) 25 MG 24 hr tablet Take 1 tablet by mouth once daily 90 tablet 3  . Multiple Vitamin (MULTIVITAMIN WITH MINERALS) TABS Take 1 tablet by mouth daily.    . Multiple Vitamins-Minerals (PRESERVISION AREDS) CAPS Take 1 capsule by mouth in the morning and at bedtime.    . simvastatin (ZOCOR) 20 MG tablet Take 1 tablet (20 mg total) by mouth at bedtime. 90 tablet 3  . warfarin (COUMADIN) 3 MG tablet TAKE AS DIRECTED BY COUMADIN CLINIC 70 tablet 0   No current facility-administered medications for this visit.     Objective:  BP 128/78   Pulse 95   Temp 98.1 F (36.7 C) (Temporal)   Resp 18   Ht 5\' 4"  (1.626 m)   Wt 141 lb 3.2 oz (64 kg)   SpO2 98%   BMI 24.24 kg/m  Gen: NAD, resting comfortably CV: RRR no murmurs rubs or gallops Lungs: CTAB no crackles, wheeze, rhonchi Abdomen: soft/nontender/nondistended/normal bowel sounds.  Ext: no edema Skin: warm, dry     Assessment and Plan   # Atrial fibrillation/also with  history superior mesenteric artery thrombosis- secondary indication for long term coumadin S:rate controlled on metoprolol 25 mg XR and diltiazem 180mg  XR. Anticoagulated on coumadin through cardiology   A/P: Appropriately anticoagulated and rate controlled-continue current medication  # Hypertension S:compliant with metoprolol 25 mg XR, diltiazem 180mg  XR  BP Readings from Last 3 Encounters:  06/04/20 128/78  04/02/20 126/72  05/08/19 128/62  A/P: Excellent control-continue current medication  # Hyperlipidemia/aortic atherosclerosis S:compliant with simvastatin  20mg .  Aortic atherosclerosis noted 01/28/15 on CT scan Lab Results  Component Value Date   CHOL 131 04/02/2020   HDL 56 04/02/2020   LDLCALC 55 04/02/2020   TRIG 114 04/02/2020   CHOLHDL 2.3 04/02/2020   A/P: LDL with ideal control on recent lipids-continue current medication  For aortic atherosclerosis continue risk factor modification with blood pressure control and statin  # Osteoporosis/vitamin D deficiency S:  Patient did not tolerate fosamax. Does not want injectables like prolia or reclast. She is on vitamin D (she opted not to take calcium). Declineds bone density retest as would not want medication.  discussed importance of avoiding falls Lab Results  Component Value Date   VD25OH 53.56 10/31/2018  A/P: offered repeat dexa- declines. Agrees to be careful/try to avoid falls. Continue vitamin D- possibly check Vitamin D next visit  # GERD S: very sparing use of nexium 20mg - effective when she uses.  We discussed potential negative effects on bone density- she states almost never using now! A/P: Discussed Pepcid would be a safer alternative. Thankfully rarely using PPI  # anxiety- sparing use of xanax- has not used xanax in 2020 or 2021- used once in 2019. Would prefer for her to avoid using xanax with her age and osteoporosis. Will remove from med list- last fill was 2018  Recommended follow up: Return in about 1 year (around 06/04/2021) for follow up- or sooner if needed. Future Appointments  Date Time Provider [place]  06/25/2020 10:00 AM [facility]   Lab/Order associations:   ICD-10-CM   1. Atrial fibrillation, unspecified type ([place])  I48.91   2. Superior mesenteric artery thrombosis ([place]) Chronic K55.069   3. Age-related osteoporosis without current pathological fracture  M81.0   4. Hyperlipidemia, unspecified hyperlipidemia type  E78.5   5. Essential hypertension  I10   6. Anxiety state  F41.1    Meds ordered this encounter   Medications  . simvastatin (ZOCOR) 20 MG tablet    Sig: Take 1 tablet (20 mg total) by mouth at bedtime.    Dispense:  90 tablet    Refill:  3   Return precautions advised.  Garret Reddish, MD

## 2020-06-03 NOTE — Patient Instructions (Addendum)
Health Maintenance Due  Topic Date Due  . INFLUENZA VACCINE In office flu shot today high dose 03/23/2020  . TETANUS/TDAP - due for this if you get a cut/scrape- can also get at pharmacy at any point -cheaper at pharmacy 03/31/2020   Pepcid is a better choice if you have to use something for reflux but glad you have not had to lately

## 2020-06-04 ENCOUNTER — Other Ambulatory Visit: Payer: Self-pay

## 2020-06-04 ENCOUNTER — Encounter: Payer: Self-pay | Admitting: Family Medicine

## 2020-06-04 ENCOUNTER — Ambulatory Visit (INDEPENDENT_AMBULATORY_CARE_PROVIDER_SITE_OTHER): Payer: Medicare Other | Admitting: Family Medicine

## 2020-06-04 VITALS — BP 128/78 | HR 95 | Temp 98.1°F | Resp 18 | Ht 64.0 in | Wt 141.2 lb

## 2020-06-04 DIAGNOSIS — E785 Hyperlipidemia, unspecified: Secondary | ICD-10-CM

## 2020-06-04 DIAGNOSIS — F411 Generalized anxiety disorder: Secondary | ICD-10-CM | POA: Diagnosis not present

## 2020-06-04 DIAGNOSIS — I10 Essential (primary) hypertension: Secondary | ICD-10-CM

## 2020-06-04 DIAGNOSIS — M810 Age-related osteoporosis without current pathological fracture: Secondary | ICD-10-CM

## 2020-06-04 DIAGNOSIS — I4891 Unspecified atrial fibrillation: Secondary | ICD-10-CM

## 2020-06-04 DIAGNOSIS — K55069 Acute infarction of intestine, part and extent unspecified: Secondary | ICD-10-CM

## 2020-06-04 DIAGNOSIS — E559 Vitamin D deficiency, unspecified: Secondary | ICD-10-CM

## 2020-06-04 DIAGNOSIS — Z23 Encounter for immunization: Secondary | ICD-10-CM

## 2020-06-04 MED ORDER — SIMVASTATIN 20 MG PO TABS: 20.0000 mg | ORAL_TABLET | Freq: Every day | ORAL | 3 refills | Status: DC

## 2020-06-06 ENCOUNTER — Ambulatory Visit: Payer: Medicare Other | Admitting: Family Medicine

## 2020-06-17 ENCOUNTER — Ambulatory Visit: Payer: Medicare Other | Admitting: Family Medicine

## 2020-06-25 ENCOUNTER — Ambulatory Visit (INDEPENDENT_AMBULATORY_CARE_PROVIDER_SITE_OTHER): Payer: Medicare Other

## 2020-06-25 ENCOUNTER — Other Ambulatory Visit: Payer: Self-pay

## 2020-06-25 DIAGNOSIS — I4891 Unspecified atrial fibrillation: Secondary | ICD-10-CM | POA: Diagnosis not present

## 2020-06-25 DIAGNOSIS — Z7901 Long term (current) use of anticoagulants: Secondary | ICD-10-CM

## 2020-06-25 DIAGNOSIS — K55069 Acute infarction of intestine, part and extent unspecified: Secondary | ICD-10-CM | POA: Diagnosis not present

## 2020-06-25 DIAGNOSIS — Z5181 Encounter for therapeutic drug level monitoring: Secondary | ICD-10-CM

## 2020-06-25 LAB — POCT INR: INR: 2.2 (ref 2.0–3.0)

## 2020-06-25 NOTE — Patient Instructions (Signed)
Continue taking Warfarin 1/2 tablet everyday except 1 tablet on Tuesday.  Recheck INR in 4 weeks. Coumadin Clinic # [phone] (prefers 4 weeks between visits)     

## 2020-07-04 ENCOUNTER — Encounter (HOSPITAL_COMMUNITY): Payer: Self-pay

## 2020-07-04 ENCOUNTER — Other Ambulatory Visit: Payer: Self-pay

## 2020-07-04 ENCOUNTER — Ambulatory Visit (HOSPITAL_COMMUNITY)
Admission: EM | Admit: 2020-07-04 | Discharge: 2020-07-04 | Disposition: A | Discharge status: Home / Self Care | Payer: Medicare Other | Attending: Family Medicine | Admitting: Family Medicine

## 2020-07-04 ENCOUNTER — Telehealth: Payer: Self-pay | Admitting: Family Medicine

## 2020-07-04 ENCOUNTER — Ambulatory Visit (INDEPENDENT_AMBULATORY_CARE_PROVIDER_SITE_OTHER): Payer: Medicare Other

## 2020-07-04 DIAGNOSIS — M85851 Other specified disorders of bone density and structure, right thigh: Secondary | ICD-10-CM | POA: Diagnosis not present

## 2020-07-04 DIAGNOSIS — M25551 Pain in right hip: Secondary | ICD-10-CM

## 2020-07-04 DIAGNOSIS — M899 Disorder of bone, unspecified: Secondary | ICD-10-CM

## 2020-07-04 DIAGNOSIS — W19XXXA Unspecified fall, initial encounter: Secondary | ICD-10-CM

## 2020-07-04 NOTE — ED Provider Notes (Signed)
[facility]    CSN: [id number] Arrival date & time: 07/04/20  1232      History   Chief Complaint Chief Complaint  Patient presents with  . Hip Pain  . Fall    HPI Lisa Douglas is a 86 y.o. female.   Patient is a 86 year old female with past medical history of anemia, anxiety, A. fib, diverticulosis, macular degeneration, osteoarthrosis, osteoporosis.  She presents today with complaints of right hip pain, buttocks pain after fall.  Reporting her foot fell asleep when she tried to stand up off the couch and fell onto the right side.  She has had pain since that has moved but now is located mostly to the right hip and buttocks..  She is been able to ambulate without any difficulties.  Describes the pain is burning at times.  No numbness, tingling, weakness in extremity.  No bruising or deformities     Past Medical History:  Diagnosis Date  . 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 Not current problem.    . Angiodysplasia of intestine (without mention of hemorrhage)   . Anxiety state, unspecified   . Atrial fibrillation ([place])   . Diverticulosis of colon (without mention of hemorrhage)   . Diverticulosis of colon with hemorrhage 06/12/2014  . Esophageal reflux   . Macular degeneration (senile) of retina, unspecified   . 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 Osteoarthrosis, unspecified whether generalized or localized, unspecified site   . Osteoporosis, unspecified   . Other and unspecified hyperlipidemia   . Unspecified venous (peripheral) insufficiency     Patient Active Problem List   Diagnosis Date Noted  . Aortic atherosclerosis ([place]) 10/31/2018  . Pseudophakia of both eyes 01/26/2018  . Osteoarthritis of left knee 10/26/2016  . Posterior vitreous detachment, bilateral 05/25/2016  . Essential hypertension 04/27/2016  . Epigastric hernia 12/23/2014  . Nuclear cataract of both eyes 11/08/2014  .  Diverticulitis of colon 06/12/2014  . CKD (chronic kidney disease), stage II 03/26/2014  . Long term current use of anticoagulant therapy 03/20/2012  . Superior mesenteric artery thrombosis ([place]) 03/03/2012  . Leg cramps 12/30/2011  . Macular hole 08/31/2011  . Vitamin D deficiency 04/01/2011  . Anxiety state 10/03/2007  . Advanced nonexudative age-related macular degeneration of both eyes without subfoveal involvement 10/03/2007  . ATRIAL FIBRILLATION, CHRONIC 10/03/2007  . VENOUS INSUFFICIENCY 10/03/2007  . GERD 10/03/2007  . DEGENERATIVE JOINT DISEASE 10/03/2007  . Osteoporosis 10/03/2007  . Hyperlipidemia 10/02/2007  . Angiodysplasia of colon 06/01/2004    Past Surgical History:  Procedure Laterality Date  . APPENDECTOMY  1951  . CATARACT EXTRACTION Left 04/19/2017  . LAPAROTOMY  03/07/2012   Procedure: EXPLORATORY LAPAROTOMY;  Surgeon: Angelia Mould, MD;  Location: [facility] OR;  Service: Vascular;  Laterality: N/A;  Exploratory Laparotomy with Evacuation of Hematoma.  . TONSILLECTOMY  1943    OB History   No obstetric history on file.      Home Medications    Prior to Admission medications   Medication Sig Start Date End Date Taking? Authorizing Provider  diltiazem (CARDIZEM CD) 180 MG 24 hr capsule Take 1 capsule by mouth once daily 05/19/20  Yes Burtis Junes, NP  metoprolol succinate (TOPROL-XL) 25 MG 24 hr tablet Take 1 tablet by mouth once daily 05/19/20  Yes Burtis Junes, NP  simvastatin (ZOCOR) 20 MG tablet Take 1 tablet (20 mg total) by mouth at bedtime. 06/04/20  Yes Marin Olp, MD  warfarin (COUMADIN) 3 MG tablet TAKE AS DIRECTED BY COUMADIN CLINIC 03/18/20  Yes Burtis Junes, NP  Cholecalciferol (VITAMIN D) 2000 UNITS CAPS Take 1 capsule by mouth daily.    [provider]  esomeprazole (NEXIUM) 20 MG capsule Take 20 mg by mouth as needed (reflux).     [provider]  Multiple Vitamin (MULTIVITAMIN WITH MINERALS) TABS Take 1  tablet by mouth daily.    [provider]  Multiple Vitamins-Minerals (PRESERVISION AREDS) CAPS Take 1 capsule by mouth in the morning and at bedtime.    [provider]    Family History Family History  Problem Relation Age of Onset  . Hemochromatosis Father        tested in [place], denies history  . Heart disease Sister   . Breast cancer Sister        died 53, not sure when she had it  . Lymphoma Sister     Social History Social History   Tobacco Use  . Smoking status: Former Smoker    Packs/day: 0.25    Years: 2.00    Pack years: 0.50    Types: Cigarettes    Quit date: 08/24/1951    Years since quitting: 68.9  . Smokeless tobacco: Never Used  . Tobacco comment: smoked at age 32 for a short time--a few months  Vaping Use  . Vaping Use: Never used  Substance Use Topics  . Alcohol use: No  . Drug use: No     Allergies   Penicillins, Alendronate sodium, Atorvastatin, and Morphine and related   Review of Systems Review of Systems   Physical Exam Triage Vital Signs ED Triage Vitals  Enc Vitals Group     BP 07/04/20 1251 134/71     Pulse Rate 07/04/20 1251 79     Resp 07/04/20 1251 19     Temp 07/04/20 1251 97.9 F (36.6 C)     Temp Source 07/04/20 1251 Oral     SpO2 07/04/20 1251 96 %     Weight --      Height --      Head Circumference --      Peak Flow --      Pain Score 07/04/20 1247 2     Pain Loc --      Pain Edu? --      Excl. in [place]? --    No data found.  Updated Vital Signs BP 134/71 (BP Location: Right Arm)   Pulse 79   Temp 97.9 F (36.6 C) (Oral)   Resp 19   SpO2 96%   Visual Acuity Right Eye Distance:   Left Eye Distance:   Bilateral Distance:    Right Eye Near:   Left Eye Near:    Bilateral Near:     Physical Exam Vitals and nursing note reviewed.  Constitutional:      General: She is not in acute distress.    Appearance: Normal appearance. She is not ill-appearing, toxic-appearing or diaphoretic.  HENT:      Head: Normocephalic.     Nose: Nose normal.  Eyes:     Conjunctiva/sclera: Conjunctivae normal.  Pulmonary:     Effort: Pulmonary effort is normal.  Musculoskeletal:        General: Normal range of motion.     Cervical back: Normal range of motion.     Lumbar back: Tenderness present. Normal range of motion.       Back:  Right hip: Tenderness present. No deformity, lacerations, bony tenderness or crepitus. Normal range of motion. Normal strength.  Skin:    General: Skin is warm and dry.     Findings: No rash.  Neurological:     Mental Status: She is alert.  Psychiatric:        Mood and Affect: Mood normal.      UC Treatments / Results  Labs (all labs ordered are listed, but only abnormal results are displayed) Labs Reviewed - No data to display  EKG   Radiology DG Hip Unilat With Pelvis 2-3 Views Right  Result Date: 07/04/2020 CLINICAL DATA:  Pain EXAM: DG HIP (WITH OR WITHOUT PELVIS) 2-3V RIGHT COMPARISON:  None. FINDINGS: Frontal pelvis as well as frontal and lateral right hip images were obtained. No fracture or dislocation. There is mild symmetric narrowing of each hip joint. No erosive change. There is a lytic appearing area in the inferior right iliac bone slightly superior to the acetabulum measuring 2.2 x 1.9 cm. No other similar appearing bone lesions. IMPRESSION: Lytic appearing bone lesion in the inferior right iliac bone slightly superior to the right acetabulum measuring 2.2 x 1.9 cm. A lesion of this nature could represent a metastatic focus or multiple myeloma. In this regard, question underlying focus of known neoplasm. No fracture or dislocation.  Symmetric narrowing each hip joint. These results will be called to the ordering clinician or representative by the Radiologist Assistant, and communication documented in the PACS or [employer]. Electronically Signed   By: Lowella Grip III M.D.   On: 07/04/2020 13:28    Procedures Procedures  (including critical care time)  Medications Ordered in UC Medications - No data to display  Initial Impression / Assessment and Plan / UC Course  I have reviewed the triage vital signs and the nursing notes.  Pertinent labs & imaging results that were available during my care of the patient were reviewed by me and considered in my medical decision making (see chart for details).     Right hip pain and buttocks pain post fall X-ray without any acute fractures today although noted bone lesion in the inferior right iliac bone.  Concern for metastatic focus or multiple myeloma. Patient will need further follow-up with CT scan or MRI. Contacted PCP for follow-up She can continue Tylenol for pain as needed  Final Clinical Impressions(s) / UC Diagnoses   Final diagnoses:  Right hip pain  Fall, initial encounter     Discharge Instructions     Follow up with your doctor for further evaluation of x ray findings. I have contacted your doctor.  Tylenol for pain as needed.     ED Prescriptions    None     PDMP not reviewed this encounter.   Orvan July, NP 07/04/20 1359

## 2020-07-04 NOTE — Telephone Encounter (Signed)
Team make sure patient knows I have ordered a pet scan to further evaluate the bone lesion that was noted.   We will call you within two weeks about your referral for PET scan at [facility] . If you do not hear within 3 weeks, give [place] a call.

## 2020-07-04 NOTE — ED Triage Notes (Signed)
Pt states approx 3 weeks ago, she fell while trying to stand from seated position on couch. States she fell onto carpet and landed on right side.   Pt c/o pain to right hip into right buttocks region.  Able to ambulate and cross right leg over left while seated. Denies head injury or LOC, SOB, numbness to right foot.

## 2020-07-04 NOTE — Telephone Encounter (Signed)
-----   Message from Orvan July, NP sent at 07/04/2020  2:00 PM EST ----- Bone lesion found on x ray. Will need follow up. Thanks, [employer]

## 2020-07-04 NOTE — Discharge Instructions (Addendum)
Follow up with your doctor for further evaluation of x ray findings. I have contacted your doctor.  Tylenol for pain as needed.

## 2020-07-07 ENCOUNTER — Telehealth: Payer: Self-pay

## 2020-07-07 NOTE — Telephone Encounter (Signed)
Pt is requesting to be seen for right hip pain that she went to the ED for. Can I use a same day slot for next week?

## 2020-07-07 NOTE — Telephone Encounter (Signed)
Yes

## 2020-07-07 NOTE — Telephone Encounter (Signed)
Called and spoke with pt and pt made aware.

## 2020-07-07 NOTE — Telephone Encounter (Signed)
Called to schedule pt and she said a nurse from our office called her and told her Dr. Yong Channel has ordered what needs to be done. Pt states she doesn't need an appointment anymore

## 2020-07-14 ENCOUNTER — Other Ambulatory Visit: Payer: Self-pay | Admitting: Nurse Practitioner

## 2020-07-22 ENCOUNTER — Other Ambulatory Visit: Payer: Self-pay

## 2020-07-22 ENCOUNTER — Ambulatory Visit (HOSPITAL_COMMUNITY)
Admission: RE | Admit: 2020-07-22 | Discharge: 2020-07-22 | Disposition: A | Discharge status: Home / Self Care | Payer: Medicare Other | Source: Ambulatory Visit | Attending: Family Medicine | Admitting: Family Medicine

## 2020-07-22 DIAGNOSIS — I700 Atherosclerosis of aorta: Secondary | ICD-10-CM | POA: Diagnosis not present

## 2020-07-22 DIAGNOSIS — M899 Disorder of bone, unspecified: Secondary | ICD-10-CM | POA: Diagnosis not present

## 2020-07-22 DIAGNOSIS — I2510 Atherosclerotic heart disease of native coronary artery without angina pectoris: Secondary | ICD-10-CM | POA: Diagnosis not present

## 2020-07-22 LAB — GLUCOSE, CAPILLARY: Glucose-Capillary: 103 mg/dL — ABNORMAL HIGH (ref 70–99)

## 2020-07-22 MED ORDER — FLUDEOXYGLUCOSE F - 18 (FDG) INJECTION
6.9000 | Freq: Once | INTRAVENOUS | Status: AC | PRN
  Administered 2020-07-22: 6.9 via INTRAVENOUS

## 2020-07-23 ENCOUNTER — Ambulatory Visit (INDEPENDENT_AMBULATORY_CARE_PROVIDER_SITE_OTHER): Payer: Medicare Other

## 2020-07-23 DIAGNOSIS — Z5181 Encounter for therapeutic drug level monitoring: Secondary | ICD-10-CM | POA: Diagnosis not present

## 2020-07-23 DIAGNOSIS — Z7901 Long term (current) use of anticoagulants: Secondary | ICD-10-CM | POA: Diagnosis not present

## 2020-07-23 DIAGNOSIS — I4891 Unspecified atrial fibrillation: Secondary | ICD-10-CM | POA: Diagnosis not present

## 2020-07-23 DIAGNOSIS — K55069 Acute infarction of intestine, part and extent unspecified: Secondary | ICD-10-CM | POA: Diagnosis not present

## 2020-07-23 LAB — POCT INR: INR: 2.3 (ref 2.0–3.0)

## 2020-07-23 NOTE — Patient Instructions (Signed)
Continue taking Warfarin 1/2 tablet everyday except 1 tablet on Tuesday.  Recheck INR in 4 weeks. Coumadin Clinic # [phone] (prefers 4 weeks between visits)     

## 2020-07-29 DIAGNOSIS — H43813 Vitreous degeneration, bilateral: Secondary | ICD-10-CM | POA: Diagnosis not present

## 2020-07-29 DIAGNOSIS — H35342 Macular cyst, hole, or pseudohole, left eye: Secondary | ICD-10-CM | POA: Diagnosis not present

## 2020-07-29 DIAGNOSIS — Z961 Presence of intraocular lens: Secondary | ICD-10-CM | POA: Diagnosis not present

## 2020-07-29 DIAGNOSIS — H353133 Nonexudative age-related macular degeneration, bilateral, advanced atrophic without subfoveal involvement: Secondary | ICD-10-CM | POA: Diagnosis not present

## 2020-08-20 ENCOUNTER — Ambulatory Visit (INDEPENDENT_AMBULATORY_CARE_PROVIDER_SITE_OTHER): Payer: Medicare Other

## 2020-08-20 ENCOUNTER — Other Ambulatory Visit: Payer: Self-pay

## 2020-08-20 DIAGNOSIS — Z7901 Long term (current) use of anticoagulants: Secondary | ICD-10-CM | POA: Diagnosis not present

## 2020-08-20 DIAGNOSIS — Z5181 Encounter for therapeutic drug level monitoring: Secondary | ICD-10-CM

## 2020-08-20 DIAGNOSIS — K55069 Acute infarction of intestine, part and extent unspecified: Secondary | ICD-10-CM | POA: Diagnosis not present

## 2020-08-20 DIAGNOSIS — I4891 Unspecified atrial fibrillation: Secondary | ICD-10-CM

## 2020-08-20 LAB — POCT INR: INR: 2.6 (ref 2.0–3.0)

## 2020-08-20 NOTE — Patient Instructions (Signed)
Continue taking Warfarin 1/2 tablet everyday except 1 tablet on Tuesday.  Recheck INR in 4 weeks. [facility]in [facility] # [phone] (prefers 4 weeks between visits)  Eat greens today.

## 2020-09-17 ENCOUNTER — Other Ambulatory Visit: Payer: Self-pay

## 2020-09-17 ENCOUNTER — Ambulatory Visit (INDEPENDENT_AMBULATORY_CARE_PROVIDER_SITE_OTHER): Payer: Medicare Other

## 2020-09-17 DIAGNOSIS — Z7901 Long term (current) use of anticoagulants: Secondary | ICD-10-CM

## 2020-09-17 DIAGNOSIS — Z5181 Encounter for therapeutic drug level monitoring: Secondary | ICD-10-CM | POA: Diagnosis not present

## 2020-09-17 DIAGNOSIS — I4891 Unspecified atrial fibrillation: Secondary | ICD-10-CM | POA: Diagnosis not present

## 2020-09-17 DIAGNOSIS — K55069 Acute infarction of intestine, part and extent unspecified: Secondary | ICD-10-CM | POA: Diagnosis not present

## 2020-09-17 LAB — POCT INR: INR: 2.5 (ref 2.0–3.0)

## 2020-09-17 NOTE — Patient Instructions (Signed)
Continue taking Warfarin 1/2 tablet everyday except 1 tablet on Tuesday.  Recheck INR in 4 weeks. Coumadin Clinic # [phone] (prefers 4 weeks between visits)     

## 2020-10-15 ENCOUNTER — Ambulatory Visit (INDEPENDENT_AMBULATORY_CARE_PROVIDER_SITE_OTHER): Payer: Medicare Other

## 2020-10-15 ENCOUNTER — Other Ambulatory Visit: Payer: Self-pay

## 2020-10-15 DIAGNOSIS — I4891 Unspecified atrial fibrillation: Secondary | ICD-10-CM | POA: Diagnosis not present

## 2020-10-15 DIAGNOSIS — K55069 Acute infarction of intestine, part and extent unspecified: Secondary | ICD-10-CM

## 2020-10-15 DIAGNOSIS — Z5181 Encounter for therapeutic drug level monitoring: Secondary | ICD-10-CM

## 2020-10-15 DIAGNOSIS — Z7901 Long term (current) use of anticoagulants: Secondary | ICD-10-CM

## 2020-10-15 LAB — POCT INR: INR: 2.3 (ref 2.0–3.0)

## 2020-10-15 NOTE — Patient Instructions (Signed)
Continue taking Warfarin 1/2 tablet everyday except 1 tablet on Tuesday.  Recheck INR in 4 weeks. Coumadin Clinic # [phone] (prefers 4 weeks between visits)     

## 2020-11-12 ENCOUNTER — Telehealth: Payer: Self-pay

## 2020-11-12 ENCOUNTER — Other Ambulatory Visit: Payer: Self-pay

## 2020-11-12 ENCOUNTER — Ambulatory Visit (INDEPENDENT_AMBULATORY_CARE_PROVIDER_SITE_OTHER): Payer: Medicare Other | Admitting: Pharmacist

## 2020-11-12 DIAGNOSIS — K55069 Acute infarction of intestine, part and extent unspecified: Secondary | ICD-10-CM

## 2020-11-12 DIAGNOSIS — I4891 Unspecified atrial fibrillation: Secondary | ICD-10-CM | POA: Diagnosis not present

## 2020-11-12 DIAGNOSIS — M25562 Pain in left knee: Secondary | ICD-10-CM | POA: Diagnosis not present

## 2020-11-12 DIAGNOSIS — Z7901 Long term (current) use of anticoagulants: Secondary | ICD-10-CM

## 2020-11-12 LAB — POCT INR: INR: 1.9 — AB (ref 2.0–3.0)

## 2020-11-12 NOTE — Telephone Encounter (Signed)
lmom for missed appt 

## 2020-11-19 ENCOUNTER — Other Ambulatory Visit: Payer: Self-pay

## 2020-11-19 MED ORDER — WARFARIN SODIUM 3 MG PO TABS: ORAL_TABLET | ORAL | 6 refills | Status: DC

## 2020-12-10 ENCOUNTER — Other Ambulatory Visit: Payer: Self-pay

## 2020-12-10 ENCOUNTER — Ambulatory Visit (INDEPENDENT_AMBULATORY_CARE_PROVIDER_SITE_OTHER): Payer: Medicare Other

## 2020-12-10 DIAGNOSIS — Z5181 Encounter for therapeutic drug level monitoring: Secondary | ICD-10-CM

## 2020-12-10 DIAGNOSIS — Z7901 Long term (current) use of anticoagulants: Secondary | ICD-10-CM | POA: Diagnosis not present

## 2020-12-10 DIAGNOSIS — K55069 Acute infarction of intestine, part and extent unspecified: Secondary | ICD-10-CM | POA: Diagnosis not present

## 2020-12-10 DIAGNOSIS — I4891 Unspecified atrial fibrillation: Secondary | ICD-10-CM

## 2020-12-10 LAB — POCT INR: INR: 2.6 (ref 2.0–3.0)

## 2020-12-10 NOTE — Patient Instructions (Signed)
Continue taking Warfarin 1/2 tablet everyday except 1 tablet on Tuesday.  Recheck INR in 4 weeks. Coumadin Clinic # [phone] (prefers 4 weeks between visits)

## 2021-01-07 ENCOUNTER — Ambulatory Visit (INDEPENDENT_AMBULATORY_CARE_PROVIDER_SITE_OTHER): Payer: Medicare Other

## 2021-01-07 ENCOUNTER — Other Ambulatory Visit: Payer: Self-pay

## 2021-01-07 DIAGNOSIS — K55069 Acute infarction of intestine, part and extent unspecified: Secondary | ICD-10-CM | POA: Diagnosis not present

## 2021-01-07 DIAGNOSIS — Z5181 Encounter for therapeutic drug level monitoring: Secondary | ICD-10-CM

## 2021-01-07 DIAGNOSIS — Z7901 Long term (current) use of anticoagulants: Secondary | ICD-10-CM

## 2021-01-07 DIAGNOSIS — I4891 Unspecified atrial fibrillation: Secondary | ICD-10-CM

## 2021-01-07 LAB — POCT INR: INR: 2.2 (ref 2.0–3.0)

## 2021-01-07 NOTE — Patient Instructions (Signed)
Continue taking Warfarin 1/2 tablet everyday except 1 tablet on Tuesday.  Recheck INR in 4 weeks. Coumadin Clinic # [phone] (prefers 4 weeks between visits)

## 2021-01-24 ENCOUNTER — Emergency Department (HOSPITAL_COMMUNITY)
Admission: EM | Admit: 2021-01-24 | Discharge: 2021-01-24 | Disposition: A | Discharge status: Home / Self Care | Payer: Medicare Other | Attending: Emergency Medicine | Admitting: Emergency Medicine

## 2021-01-24 ENCOUNTER — Emergency Department (HOSPITAL_COMMUNITY): Payer: Medicare Other

## 2021-01-24 DIAGNOSIS — S0992XA Unspecified injury of nose, initial encounter: Secondary | ICD-10-CM | POA: Diagnosis present

## 2021-01-24 DIAGNOSIS — S51811A Laceration without foreign body of right forearm, initial encounter: Secondary | ICD-10-CM | POA: Diagnosis not present

## 2021-01-24 DIAGNOSIS — W01198A Fall on same level from slipping, tripping and stumbling with subsequent striking against other object, initial encounter: Secondary | ICD-10-CM | POA: Diagnosis not present

## 2021-01-24 DIAGNOSIS — S022XXA Fracture of nasal bones, initial encounter for closed fracture: Secondary | ICD-10-CM | POA: Insufficient documentation

## 2021-01-24 DIAGNOSIS — Y9301 Activity, walking, marching and hiking: Secondary | ICD-10-CM | POA: Diagnosis not present

## 2021-01-24 DIAGNOSIS — N182 Chronic kidney disease, stage 2 (mild): Secondary | ICD-10-CM | POA: Insufficient documentation

## 2021-01-24 DIAGNOSIS — I4891 Unspecified atrial fibrillation: Secondary | ICD-10-CM | POA: Diagnosis not present

## 2021-01-24 DIAGNOSIS — S0993XA Unspecified injury of face, initial encounter: Secondary | ICD-10-CM | POA: Diagnosis not present

## 2021-01-24 DIAGNOSIS — Z7901 Long term (current) use of anticoagulants: Secondary | ICD-10-CM | POA: Diagnosis not present

## 2021-01-24 DIAGNOSIS — S01511A Laceration without foreign body of lip, initial encounter: Secondary | ICD-10-CM | POA: Insufficient documentation

## 2021-01-24 DIAGNOSIS — I6523 Occlusion and stenosis of bilateral carotid arteries: Secondary | ICD-10-CM | POA: Diagnosis not present

## 2021-01-24 DIAGNOSIS — S0083XA Contusion of other part of head, initial encounter: Secondary | ICD-10-CM

## 2021-01-24 DIAGNOSIS — Z87891 Personal history of nicotine dependence: Secondary | ICD-10-CM | POA: Diagnosis not present

## 2021-01-24 DIAGNOSIS — M5030 Other cervical disc degeneration, unspecified cervical region: Secondary | ICD-10-CM | POA: Diagnosis not present

## 2021-01-24 DIAGNOSIS — T07XXXA Unspecified multiple injuries, initial encounter: Secondary | ICD-10-CM | POA: Diagnosis not present

## 2021-01-24 DIAGNOSIS — Z79899 Other long term (current) drug therapy: Secondary | ICD-10-CM | POA: Diagnosis not present

## 2021-01-24 DIAGNOSIS — I129 Hypertensive chronic kidney disease with stage 1 through stage 4 chronic kidney disease, or unspecified chronic kidney disease: Secondary | ICD-10-CM | POA: Diagnosis not present

## 2021-01-24 DIAGNOSIS — Z23 Encounter for immunization: Secondary | ICD-10-CM | POA: Insufficient documentation

## 2021-01-24 DIAGNOSIS — I10 Essential (primary) hypertension: Secondary | ICD-10-CM | POA: Diagnosis not present

## 2021-01-24 DIAGNOSIS — R58 Hemorrhage, not elsewhere classified: Secondary | ICD-10-CM | POA: Diagnosis not present

## 2021-01-24 DIAGNOSIS — Z043 Encounter for examination and observation following other accident: Secondary | ICD-10-CM | POA: Diagnosis not present

## 2021-01-24 LAB — CBC
HCT: 41.8 % (ref 36.0–46.0)
Hemoglobin: 13.8 g/dL (ref 12.0–15.0)
MCH: 32.3 pg (ref 26.0–34.0)
MCHC: 33 g/dL (ref 30.0–36.0)
MCV: 97.9 fL (ref 80.0–100.0)
Platelets: 205 10*3/uL (ref 150–400)
RBC: 4.27 MIL/uL (ref 3.87–5.11)
RDW: 15.6 % — ABNORMAL HIGH (ref 11.5–15.5)
WBC: 8 10*3/uL (ref 4.0–10.5)
nRBC: 0 % (ref 0.0–0.2)

## 2021-01-24 LAB — PROTIME-INR
INR: 1.8 — ABNORMAL HIGH (ref 0.8–1.2)
Prothrombin Time: 21.1 seconds — ABNORMAL HIGH (ref 11.4–15.2)

## 2021-01-24 MED ORDER — OXYMETAZOLINE HCL 0.05 % NA SOLN
2.0000 | Freq: Once | NASAL | Status: AC
  Administered 2021-01-24: 2 via NASAL
  Filled 2021-01-24: qty 30

## 2021-01-24 MED ORDER — TETANUS-DIPHTH-ACELL PERTUSSIS 5-2.5-18.5 LF-MCG/0.5 IM SUSY
0.5000 mL | PREFILLED_SYRINGE | Freq: Once | INTRAMUSCULAR | Status: AC
  Administered 2021-01-24: 0.5 mL via INTRAMUSCULAR
  Filled 2021-01-24: qty 0.5

## 2021-01-24 NOTE — ED Notes (Signed)
Thoroughly reviewed d/c instructions with pt and pt's great great niece Judson Roch.  Both the pt and Judson Roch verbalized understanding.

## 2021-01-24 NOTE — Discharge Instructions (Signed)
No nose blowing for 1 week. Apply ice packs to the face for 10 to 15 minutes at a time several times per day for the next few days. Sleep with head of bed elevated or on a few pillows to help with swelling. You may use nasal saline twice daily. Apply Neosporin/antibiotic ointment to the abrasions on your face and arm. Follow-up with ENT in 2 weeks if you have problems breathing out of your nose or other concerns regarding your nose injury. If you develop a nosebleed at home, spray 1 to 2 sprays of Afrin in your nostril and hold continuous pressure on your nose for 20 minutes.  Return to ER if any severe nosebleed. Return to ER if any severe headache, vomiting, confusion, lethargy, or sudden changes in mental status.

## 2021-01-24 NOTE — ED Triage Notes (Signed)
Patient arrived from home following mechanical fall on coumadin. Patient struck her face and has dental trauma and nasal trauma. No loc

## 2021-01-24 NOTE — ED Provider Notes (Signed)
[facility] Provider Note   CSN: [id number] Arrival date & time: 01/24/21  1302     History No chief complaint on file.   Lisa Douglas is a 87 y.o. female.  87 year old female with past medical history below including atrial fibrillation on Coumadin who presents with fall and facial injury.  Just prior to arrival, patient was walking on her deck when she tripped and fell forward, landing on her face.  She did not lose consciousness and has had no vomiting or confusion since the event.  She has had a nosebleed and sustained some abrasions.  She denies any other areas of pain aside from her face.  She denies any neck, back, chest, or extremity pain.  Unknown last tetanus vaccination.  The history is provided by the patient.       Past Medical History:  Diagnosis Date  . 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 Not current problem.    . Angiodysplasia of intestine (without mention of hemorrhage)   . Anxiety state, unspecified   . Atrial fibrillation ([place])   . Diverticulosis of colon (without mention of hemorrhage)   . Diverticulosis of colon with hemorrhage 06/12/2014  . Esophageal reflux   . Macular degeneration (senile) of retina, unspecified   . 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 Osteoarthrosis, unspecified whether generalized or localized, unspecified site   . Osteoporosis, unspecified   . Other and unspecified hyperlipidemia   . Unspecified venous (peripheral) insufficiency     Patient Active Problem List   Diagnosis Date Noted  . Aortic atherosclerosis ([place]) 10/31/2018  . Pseudophakia of both eyes 01/26/2018  . Osteoarthritis of left knee 10/26/2016  . Posterior vitreous detachment, bilateral 05/25/2016  . Essential hypertension 04/27/2016  . Epigastric hernia 12/23/2014  . Nuclear cataract of both eyes 11/08/2014  . Diverticulitis of colon 06/12/2014  . CKD (chronic kidney  disease), stage II 03/26/2014  . Long term current use of anticoagulant therapy 03/20/2012  . 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 Leg cramps 12/30/2011  . Macular hole 08/31/2011  . Vitamin D deficiency 04/01/2011  . Anxiety state 10/03/2007  . Advanced nonexudative age-related macular degeneration of both eyes without subfoveal involvement 10/03/2007  . ATRIAL FIBRILLATION, CHRONIC 10/03/2007  . VENOUS INSUFFICIENCY 10/03/2007  . GERD 10/03/2007  . DEGENERATIVE JOINT DISEASE 10/03/2007  . Osteoporosis 10/03/2007  . Hyperlipidemia 10/02/2007  . Angiodysplasia of colon 06/01/2004    Past Surgical History:  Procedure Laterality Date  . APPENDECTOMY  1951  . CATARACT EXTRACTION Left 04/19/2017  . LAPAROTOMY  03/07/2012   Procedure: EXPLORATORY LAPAROTOMY;  Surgeon: Angelia Mould, MD;  Location: [facility] OR;  Service: Vascular;  Laterality: N/A;  Exploratory Laparotomy with Evacuation of Hematoma.  . TONSILLECTOMY  1943     OB History   No obstetric history on file.     Family History  Problem Relation Age of Onset  . Hemochromatosis Father        tested in [place], denies history  . Heart disease Sister   . Breast cancer Sister        died 25, not sure when she had it  . Lymphoma Sister     Social History   Tobacco Use  . Smoking status: Former Smoker    Packs/day: 0.25    Years: 2.00    Pack years: 0.50    Types: Cigarettes    Quit date: 08/24/1951  Years since quitting: 69.4  . Smokeless tobacco: Never Used  . Tobacco comment: smoked at age 22 for a short time--a few months  Vaping Use  . Vaping Use: Never used  Substance Use Topics  . Alcohol use: No  . Drug use: No    Home Medications Prior to Admission medications   Medication Sig Start Date End Date Taking? Authorizing Provider  Cholecalciferol (VITAMIN D) 2000 UNITS CAPS Take 2,000 Units by mouth daily.   Yes [provider]  diltiazem (CARDIZEM CD) 180 MG 24 hr  capsule Take 1 capsule by mouth once daily Patient taking differently: Take 180 mg by mouth daily. 05/19/20  Yes Burtis Junes, NP  esomeprazole (NEXIUM) 20 MG capsule Take 20 mg by mouth as needed (reflux).    Yes [provider]  metoprolol succinate (TOPROL-XL) 25 MG 24 hr tablet Take 1 tablet by mouth once daily Patient taking differently: Take 25 mg by mouth daily. 05/19/20  Yes Burtis Junes, NP  Multiple Vitamin (MULTIVITAMIN WITH MINERALS) TABS Take 1 tablet by mouth daily.   Yes [provider]  Multiple Vitamins-Minerals (PRESERVISION AREDS) CAPS Take 1 capsule by mouth in the morning and at bedtime.   Yes [provider]  simvastatin (ZOCOR) 20 MG tablet Take 1 tablet (20 mg total) by mouth at bedtime. 06/04/20  Yes Marin Olp, MD  warfarin (COUMADIN) 3 MG tablet Take 1 to 2 tablets daily as directed by the coumadin clinic Patient taking differently: Take 1.5-3 mg by mouth See admin instructions. 3 mg (3 mg x 1) every Tue; 1.5 mg (3 mg x 0.5) all other days 11/19/20  Yes Alvstad, Kristin L, RPH-CPP    Allergies    Penicillins, Alendronate sodium, Atorvastatin, and Morphine and related  Review of Systems   Review of Systems All other systems reviewed and are negative except that which was mentioned in HPI  Physical Exam Updated Vital Signs BP 122/88   Pulse 83   Temp (!) 97.4 F (36.3 C)   Resp (!) 21   Ht 5\' 4"  (1.626 m)   Wt 64.4 kg   SpO2 95%   BMI 24.37 kg/m   Physical Exam Constitutional:      General: She is not in acute distress.    Appearance: Normal appearance.  HENT:     Head: Normocephalic.     Comments: Large periorbital ecchymoses and significant swelling on mid face and nasal bridge; abrasions on nasal bridge, forehead, above upper lip; laceration under upper lip that is not full-thickness and does not cross [place]mil[place] border    Nose:     Comments: Small amount of blood in right naris, no septal hematoma, no active  bleeding    Mouth/Throat:     Comments: Mild subluxation posteriorly right central incisor, tooth is not loose Eyes:     Extraocular Movements: Extraocular movements intact.     Conjunctiva/sclera: Conjunctivae normal.     Pupils: Pupils are equal, round, and reactive to light.  Cardiovascular:     Rate and Rhythm: Normal rate and regular rhythm.     Heart sounds: Normal heart sounds. No murmur heard.   Pulmonary:     Effort: Pulmonary effort is normal.     Breath sounds: Normal breath sounds.  Abdominal:     General: Abdomen is flat. Bowel sounds are normal. There is no distension.     Palpations: Abdomen is soft.     Tenderness: There is no abdominal tenderness.  Musculoskeletal:        General: No tenderness. Normal range of motion.     Cervical back: Normal range of motion and neck supple. No tenderness.     Right lower leg: No edema.     Left lower leg: No edema.     Comments: Skin tear R forearm  Skin:    General: Skin is warm and dry.  Neurological:     Mental Status: She is alert and oriented to person, place, and time.     Comments: fluent  Psychiatric:        Mood and Affect: Mood normal.        Behavior: Behavior normal.     ED Results / Procedures / Treatments   Labs (all labs ordered are listed, but only abnormal results are displayed) Labs Reviewed  PROTIME-INR - Abnormal; Notable for the following components:      Result Value   Prothrombin Time 21.1 (*)    INR 1.8 (*)    All other components within normal limits  CBC - Abnormal; Notable for the following components:   RDW 15.6 (*)    All other components within normal limits    EKG None  Radiology CT Head Wo Contrast  Result Date: 01/24/2021 CLINICAL DATA:  [place] today striking face, bloody nose, facial bruising, facial trauma, initial encounter EXAM: CT HEAD WITHOUT CONTRAST CT MAXILLOFACIAL WITHOUT CONTRAST CT CERVICAL SPINE WITHOUT CONTRAST TECHNIQUE: Multidetector CT imaging of the head,  cervical spine, and maxillofacial structures were performed using the standard protocol without intravenous contrast. Multiplanar CT image reconstructions of the cervical spine and maxillofacial structures were also generated. COMPARISON:  CT head 06/22/2015 FINDINGS: CT HEAD FINDINGS Brain: Generalized atrophy. Normal ventricular morphology. No midline shift or mass effect. Old RIGHT MCA territory infarct involving lateral RIGHT frontal region. Small vessel chronic ischemic changes of deep cerebral white matter. No intracranial hemorrhage, mass lesion, or evidence of acute infarction. No extra-axial fluid collections. Vascular: Atherosclerotic calcification of internal carotid and vertebral arteries at skull base. No hyperdense vessels. Skull: Diffuse osseous demineralization.  Calvaria intact Other: N/A CT MAXILLOFACIAL FINDINGS Osseous: Osseous demineralization. BILATERAL nasal bone fractures, minimally displaced. Remaining facial bones intact. Slight nasal septal deviation to the LEFT. Orbits: Intraorbital soft tissue planes clear. No intraorbital gas or fluid. Sinuses: Paranasal sinuses, mastoid air cells, and middle ear cavities clear bilaterally Soft tissues: Soft tissue swelling/contusion at nose extending periorbital and infraorbital bilaterally greater on LEFT. Multiple foci of soft tissue gas at nose extending into frontal region. Mild frontal soft tissue swelling slightly greater RIGHT of midline. CT CERVICAL SPINE FINDINGS Alignment: Normal Skull base and vertebrae: Osseous demineralization. Slight superior endplate concavity of T3 vertebral body, appears old. Remaining vertebral body heights maintained. Slight disc space narrowing at C5-C6 with tiny anterior inferior C5 spur. Remaining disc space heights maintained. Multilevel facet degenerative changes. No fracture, subluxation, or bone destruction. Soft tissues and spinal canal: Prevertebral soft tissues normal thickness. Scattered atherosclerotic  calcifications at carotid bifurcations and proximal great vessels. Disc levels:  No specific abnormalities Upper chest: Lung apices clear Other: N/A IMPRESSION: Atrophy with small vessel chronic ischemic changes of deep cerebral white matter. Old RIGHT MCA territory infarct. No acute intracranial abnormalities. BILATERAL nasal bone fractures. No additional facial bone abnormalities. Degenerative disc and facet disease changes of the cervical spine. No acute cervical spine abnormalities. Electronically Signed   By: Lavonia Dana M.D.   On: 01/24/2021 14:19   CT Cervical Spine Wo Contrast  Result Date: 01/24/2021 CLINICAL DATA:  [place] today striking face, bloody nose, facial bruising, facial trauma, initial encounter EXAM: CT HEAD WITHOUT CONTRAST CT MAXILLOFACIAL WITHOUT CONTRAST CT CERVICAL SPINE WITHOUT CONTRAST TECHNIQUE: Multidetector CT imaging of the head, cervical spine, and maxillofacial structures were performed using the standard protocol without intravenous contrast. Multiplanar CT image reconstructions of the cervical spine and maxillofacial structures were also generated. COMPARISON:  CT head 06/22/2015 FINDINGS: CT HEAD FINDINGS Brain: Generalized atrophy. Normal ventricular morphology. No midline shift or mass effect. Old RIGHT MCA territory infarct involving lateral RIGHT frontal region. Small vessel chronic ischemic changes of deep cerebral white matter. No intracranial hemorrhage, mass lesion, or evidence of acute infarction. No extra-axial fluid collections. Vascular: Atherosclerotic calcification of internal carotid and vertebral arteries at skull base. No hyperdense vessels. Skull: Diffuse osseous demineralization.  Calvaria intact Other: N/A CT MAXILLOFACIAL FINDINGS Osseous: Osseous demineralization. BILATERAL nasal bone fractures, minimally displaced. Remaining facial bones intact. Slight nasal septal deviation to the LEFT. Orbits: Intraorbital soft tissue planes clear. No intraorbital gas or  fluid. Sinuses: Paranasal sinuses, mastoid air cells, and middle ear cavities clear bilaterally Soft tissues: Soft tissue swelling/contusion at nose extending periorbital and infraorbital bilaterally greater on LEFT. Multiple foci of soft tissue gas at nose extending into frontal region. Mild frontal soft tissue swelling slightly greater RIGHT of midline. CT CERVICAL SPINE FINDINGS Alignment: Normal Skull base and vertebrae: Osseous demineralization. Slight superior endplate concavity of T3 vertebral body, appears old. Remaining vertebral body heights maintained. Slight disc space narrowing at C5-C6 with tiny anterior inferior C5 spur. Remaining disc space heights maintained. Multilevel facet degenerative changes. No fracture, subluxation, or bone destruction. Soft tissues and spinal canal: Prevertebral soft tissues normal thickness. Scattered atherosclerotic calcifications at carotid bifurcations and proximal great vessels. Disc levels:  No specific abnormalities Upper chest: Lung apices clear Other: N/A IMPRESSION: Atrophy with small vessel chronic ischemic changes of deep cerebral white matter. Old RIGHT MCA territory infarct. No acute intracranial abnormalities. BILATERAL nasal bone fractures. No additional facial bone abnormalities. Degenerative disc and facet disease changes of the cervical spine. No acute cervical spine abnormalities. Electronically Signed   By: Lavonia Dana M.D.   On: 01/24/2021 14:19   CT Maxillofacial WO CM  Result Date: 01/24/2021 CLINICAL DATA:  [place] today striking face, bloody nose, facial bruising, facial trauma, initial encounter EXAM: CT HEAD WITHOUT CONTRAST CT MAXILLOFACIAL WITHOUT CONTRAST CT CERVICAL SPINE WITHOUT CONTRAST TECHNIQUE: Multidetector CT imaging of the head, cervical spine, and maxillofacial structures were performed using the standard protocol without intravenous contrast. Multiplanar CT image reconstructions of the cervical spine and maxillofacial structures were  also generated. COMPARISON:  CT head 06/22/2015 FINDINGS: CT HEAD FINDINGS Brain: Generalized atrophy. Normal ventricular morphology. No midline shift or mass effect. Old RIGHT MCA territory infarct involving lateral RIGHT frontal region. Small vessel chronic ischemic changes of deep cerebral white matter. No intracranial hemorrhage, mass lesion, or evidence of acute infarction. No extra-axial fluid collections. Vascular: Atherosclerotic calcification of internal carotid and vertebral arteries at skull base. No hyperdense vessels. Skull: Diffuse osseous demineralization.  Calvaria intact Other: N/A CT MAXILLOFACIAL FINDINGS Osseous: Osseous demineralization. BILATERAL nasal bone fractures, minimally displaced. Remaining facial bones intact. Slight nasal septal deviation to the LEFT. Orbits: Intraorbital soft tissue planes clear. No intraorbital gas or fluid. Sinuses: Paranasal sinuses, mastoid air cells, and middle ear cavities clear bilaterally Soft tissues: Soft tissue swelling/contusion at nose extending periorbital and infraorbital bilaterally greater on LEFT. Multiple foci of soft tissue gas at nose  extending into frontal region. Mild frontal soft tissue swelling slightly greater RIGHT of midline. CT CERVICAL SPINE FINDINGS Alignment: Normal Skull base and vertebrae: Osseous demineralization. Slight superior endplate concavity of T3 vertebral body, appears old. Remaining vertebral body heights maintained. Slight disc space narrowing at C5-C6 with tiny anterior inferior C5 spur. Remaining disc space heights maintained. Multilevel facet degenerative changes. No fracture, subluxation, or bone destruction. Soft tissues and spinal canal: Prevertebral soft tissues normal thickness. Scattered atherosclerotic calcifications at carotid bifurcations and proximal great vessels. Disc levels:  No specific abnormalities Upper chest: Lung apices clear Other: N/A IMPRESSION: Atrophy with small vessel chronic ischemic changes  of deep cerebral white matter. Old RIGHT MCA territory infarct. No acute intracranial abnormalities. BILATERAL nasal bone fractures. No additional facial bone abnormalities. Degenerative disc and facet disease changes of the cervical spine. No acute cervical spine abnormalities. Electronically Signed   By: Lavonia Dana M.D.   On: 01/24/2021 14:19    Procedures Procedures   Medications Ordered in ED Medications  oxymetazoline (AFRIN) 0.05 % nasal spray 2 spray (2 sprays Right Nare Given 01/24/21 1402)  Tdap (BOOSTRIX) injection 0.5 mL (0.5 mLs Intramuscular Given 01/24/21 1400)    ED Course  I have reviewed the triage vital signs and the nursing notes.  Pertinent labs & imaging results that were available during my care of the patient were reviewed by me and considered in my medical decision making (see chart for details).    MDM Rules/Calculators/A&P                          Alert, GCS 15, stable vital signs.  Injuries isolated to face, denying any other areas of pain.  INR is subtherapeutic at 1.8 today.  Hemoglobin normal.  Obtained CT of head, face, and cervical spine.  Imaging notable for bilateral nasal bone fractures and mild septal deviation.  Applied Afrin to the right naris.  She is hemostatic on reassessment.  I have counseled on supportive measures for nasal bone fractures including ice to face, elevation of head of bed, and no nose blowing.  Provided with ENT follow-up in approximately 2 weeks if needed for nose problems.  Counseled on supportive measures for all of her abrasions.  Discussed with family member her need to follow-up with a dentist for her mild central incisor subluxation.  Counseled on soft diet.  Extensively reviewed return precautions including any signs or symptoms of delayed head bleed.  Family Amber voiced understanding.  Patient updated on Tdap prior to discharge. Final Clinical Impression(s) / ED Diagnoses Final diagnoses:  Closed fracture of nasal bone, initial  encounter  Contusion of face, initial encounter  Abrasions of multiple sites    Rx / DC Orders ED Discharge Orders    None       Pawan Knechtel, Wenda Overland, MD 01/24/21 1535

## 2021-01-24 NOTE — ED Notes (Signed)
Abrasion to right arm cleansed and tegaderm applied.  Pt and niece aware not to remove tegaderm.  Other surface abrasions washed and rinsed.  No distress, no pain.

## 2021-01-24 NOTE — Progress Notes (Signed)
Situation: Initial visit for pt [facility] for level 2 trauma.  Background: Facts: Per nurse, [name], had fallen. [name] was not available at time of the visit. Family: It appeared that some family was present in the room. Feelings: Not assessed at this time. Faith: Not assessed at this time.  Actions & Assessments: Per nurse, pt and family "are good" and do not require chaplain services at this time.  Recommendations: Chaplain remains available for follow-up spiritual/emotional support as needed.  Rev. Susanne Borders, MDiv     01/24/21 1300  Clinical Encounter Type  Visited With Patient not available;Health care provider  Visit Type Initial;Trauma  Referral From Nurse

## 2021-01-26 ENCOUNTER — Telehealth: Payer: Self-pay | Admitting: Nurse Practitioner

## 2021-01-26 ENCOUNTER — Telehealth: Payer: Self-pay

## 2021-01-26 NOTE — Telephone Encounter (Signed)
Please advise 

## 2021-01-26 NOTE — Telephone Encounter (Signed)
Pt c/o medication issue:  1. Name of Medication: warfarin (COUMADIN) 3 MG tablet  2. How are you currently taking this medication (dosage and times per day)? N/A  3. Are you having a reaction (difficulty breathing--STAT)?   4. What is your medication issue? [name] is calling requesting to speak with [facility] stating [facility] fell and broke her nose this weekend and while hospitalized on 01/24/21 her INR came back 1.9. She is wanting to know if adjustments should be made due to this. [name] has an upcoming coumadin appointment on 02/04/21. Please advise.

## 2021-01-26 NOTE — Telephone Encounter (Signed)
I spoke to [name] (niece) and informed her to have patient take an extra 1/2 tablet tonight only and to keep her appointment for 6/15.  She verbalized understanding.

## 2021-01-26 NOTE — Telephone Encounter (Signed)
Team I do not see mention specifically about a follow-up CT scan-I do see where she was given information to follow-up with ear nose and throat doctor-has she contacted that doctor yet?  It appears she was referred to Dr. Janeice Robinson office with [facility] nose [facility]-I am fine with you placing another referral if she would like for that.  I am open to considering imaging but I just do not see specific mention of that-may be best to discuss with ear nose and throat doctor   From ED provider summary " Obtained CT of head, face, and cervical spine.  Imaging notable for bilateral nasal bone fractures and mild septal deviation.  Applied Afrin to the right naris.  She is hemostatic on reassessment.  I have counseled on supportive measures for nasal bone fractures including ice to face, elevation of head of bed, and no nose blowing.  Provided with ENT follow-up in approximately 2 weeks if needed for nose problems.  Counseled on supportive measures for all of her abrasions.  Discussed with family member her need to follow-up with a dentist for her mild central incisor subluxation.  Counseled on soft diet.  Extensively reviewed return precautions including any signs or symptoms of delayed head bleed.  Family Amber voiced understanding.  Patient updated on Tdap prior to discharge. "

## 2021-01-26 NOTE — Telephone Encounter (Signed)
Requesting a referral to do a follow up CT regarding her ER visit for breaking her nose just to verify she is not having a slow bleed ( please view chart note.) [phone] -stephanie burk

## 2021-01-27 NOTE — Telephone Encounter (Signed)
Called and spoke with pt niece and gave below information, she states pt has an appointment this Thursday with Dr. Janeice Robinson office and she will contact us after if she needs anything else from [place].

## 2021-01-29 DIAGNOSIS — S022XXD Fracture of nasal bones, subsequent encounter for fracture with routine healing: Secondary | ICD-10-CM | POA: Diagnosis not present

## 2021-01-29 DIAGNOSIS — S0993XD Unspecified injury of face, subsequent encounter: Secondary | ICD-10-CM | POA: Diagnosis not present

## 2021-02-04 ENCOUNTER — Other Ambulatory Visit: Payer: Self-pay

## 2021-02-04 ENCOUNTER — Ambulatory Visit (INDEPENDENT_AMBULATORY_CARE_PROVIDER_SITE_OTHER): Payer: Medicare Other

## 2021-02-04 DIAGNOSIS — I4891 Unspecified atrial fibrillation: Secondary | ICD-10-CM

## 2021-02-04 DIAGNOSIS — Z7901 Long term (current) use of anticoagulants: Secondary | ICD-10-CM

## 2021-02-04 DIAGNOSIS — K55069 Acute infarction of intestine, part and extent unspecified: Secondary | ICD-10-CM

## 2021-02-04 DIAGNOSIS — Z5181 Encounter for therapeutic drug level monitoring: Secondary | ICD-10-CM

## 2021-02-04 LAB — POCT INR: INR: 3.1 — AB (ref 2.0–3.0)

## 2021-02-04 NOTE — Patient Instructions (Signed)
Hold tonight only and then continue taking Warfarin 1/2 tablet everyday except 1 tablet on Tuesday.  Recheck INR in 5 weeks. Coumadin Clinic # [phone] (prefers 4 weeks between visits)

## 2021-02-24 DIAGNOSIS — H353133 Nonexudative age-related macular degeneration, bilateral, advanced atrophic without subfoveal involvement: Secondary | ICD-10-CM | POA: Diagnosis not present

## 2021-02-24 DIAGNOSIS — Z961 Presence of intraocular lens: Secondary | ICD-10-CM | POA: Diagnosis not present

## 2021-02-24 DIAGNOSIS — H35342 Macular cyst, hole, or pseudohole, left eye: Secondary | ICD-10-CM | POA: Diagnosis not present

## 2021-02-24 DIAGNOSIS — H43813 Vitreous degeneration, bilateral: Secondary | ICD-10-CM | POA: Diagnosis not present

## 2021-03-11 ENCOUNTER — Other Ambulatory Visit: Payer: Self-pay

## 2021-03-11 ENCOUNTER — Encounter: Payer: Self-pay | Admitting: Physician Assistant

## 2021-03-11 ENCOUNTER — Ambulatory Visit (INDEPENDENT_AMBULATORY_CARE_PROVIDER_SITE_OTHER): Payer: Medicare Other | Admitting: *Deleted

## 2021-03-11 ENCOUNTER — Ambulatory Visit (INDEPENDENT_AMBULATORY_CARE_PROVIDER_SITE_OTHER): Payer: Medicare Other | Admitting: Physician Assistant

## 2021-03-11 VITALS — BP 128/60 | HR 70 | Ht 64.0 in | Wt 138.8 lb

## 2021-03-11 DIAGNOSIS — K55069 Acute infarction of intestine, part and extent unspecified: Secondary | ICD-10-CM | POA: Diagnosis not present

## 2021-03-11 DIAGNOSIS — Z7901 Long term (current) use of anticoagulants: Secondary | ICD-10-CM | POA: Diagnosis not present

## 2021-03-11 DIAGNOSIS — E782 Mixed hyperlipidemia: Secondary | ICD-10-CM

## 2021-03-11 DIAGNOSIS — I4821 Permanent atrial fibrillation: Secondary | ICD-10-CM | POA: Diagnosis not present

## 2021-03-11 DIAGNOSIS — Z9181 History of falling: Secondary | ICD-10-CM | POA: Diagnosis not present

## 2021-03-11 DIAGNOSIS — I4891 Unspecified atrial fibrillation: Secondary | ICD-10-CM | POA: Diagnosis not present

## 2021-03-11 LAB — POCT INR: INR: 2.3 (ref 2.0–3.0)

## 2021-03-11 NOTE — Progress Notes (Signed)
Cardiology Office Note:    Date:  03/11/2021   ID:  RICKELLE SYLVESTRE, DOB 1928-05-22, MRN [id number]  PCP:  Marin Olp, MD   [facility] Providers Cardiologist:  None Cardiology APP:  Sharmon Revere      Referring MD: Marin Olp, MD   Chief Complaint:  Follow-up (Atrial fibrillation)    Patient Profile:    Lisa Douglas is a 87 y.o. female with:  Permanent atrial fibrillation  Hyperlipidemia  Hx of SMA embolism requiring thrombectomy  Hx of GI bleeding (gastric AVMs) GERD Macular degeneration  Venous insufficiency  Hx of CVA (noted on CT in 6/22)  Prior CV studies: Echocardiogram 03/04/2012 - Left ventricle: The cavity size was normal. Wall thickness    was normal. The estimated ejection fraction was 60%. Wall    motion was normal; there were no regional wall motion    abnormalities.  - Mitral valve: Mild regurgitation.  - Left atrium: The atrium was mildly dilated.  - Right atrium: The atrium was mildly dilated.  - Pulmonary arteries: PA peak pressure: 3mm Hg (S).   History of Present Illness: Ms. Thibodaux is a prior pt of Dr. Aundra Dubin. She has been followed by Truitt Merle, NP over the last several years since Dr. Aundra Dubin transitioned to the [facility].  She was last seen by Cecille Rubin in 8/21.  She returns for follow-up.  She is here with her great great news.  She is overall doing well without chest pain or significant shortness of breath.  She does have some leg edema that is overall stable without change.  She fell last month and fractured her nose.  Head CT was negative for bleed.  Right after her fall, she remembers falling.  Now, she does not recall any of it.        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 Not current problem.     Angiodysplasia of intestine (without mention of hemorrhage)    Anxiety state, unspecified    Atrial fibrillation ([place])     Diverticulosis of colon (without mention of hemorrhage)    Diverticulosis of colon with hemorrhage 06/12/2014   Esophageal reflux    Macular degeneration (senile) of retina, unspecified    Mesenteric embolus (H[facility])    Osteoarthrosis, unspecified whether generalized or localized, unspecified site    Osteoporosis, unspecified    Other and unspecified hyperlipidemia    Unspecified venous (peripheral) insufficiency     Current Medications: Current Meds  Medication Sig   Cholecalciferol (VITAMIN D) 2000 UNITS CAPS Take 2,000 Units by mouth daily.   diltiazem (CARDIZEM CD) 180 MG 24 hr capsule Take 1 capsule by mouth once daily   metoprolol succinate (TOPROL-XL) 25 MG 24 hr tablet Take 1 tablet by mouth once daily   Multiple Vitamin (MULTIVITAMIN WITH MINERALS) TABS Take 1 tablet by mouth daily.   Multiple Vitamins-Minerals (PRESERVISION AREDS) CAPS Take 1 capsule by mouth in the morning and at bedtime.   omeprazole (PRILOSEC) 20 MG capsule Take 20 mg by mouth as needed (for heartburn or indigestion).   simvastatin (ZOCOR) 20 MG tablet Take 1 tablet (20 mg total) by mouth at bedtime.   warfarin (COUMADIN) 3 MG tablet Take 1 to 2 tablets daily as directed by the coumadin clinic     Allergies:   Penicillins, Alendronate sodium, Atorvastatin, and Morphine and related   Social History  Tobacco Use   Smoking status: Former    Packs/day: 0.25    Years: 2.00    Pack years: 0.50    Types: Cigarettes    Quit date: 08/24/1951    Years since quitting: 69.5   Smokeless tobacco: Never   Tobacco comments:    smoked at age 52 for a short time--a few months  Vaping Use   Vaping Use: Never used  Substance Use Topics   Alcohol use: No   Drug use: No     Family Hx: The patient's family history includes Breast cancer in her sister; Heart disease in her sister; Hemochromatosis in her father; Lymphoma in her sister.  Review of Systems  Gastrointestinal:  Negative for hematochezia.   Genitourinary:  Negative for hematuria.    EKGs/Labs/Other Test Reviewed:    EKG:  EKG is  ordered today.  The ekg ordered today demonstrates atrial fibrillation, HR 70, normal axis, QTC 401, no change from prior tracing  Recent Labs: 04/02/2020: ALT 16; BUN 11; Creatinine, Ser 0.69; Potassium 4.5; Sodium 142; TSH 2.790 01/24/2021: Hemoglobin 13.8; Platelets 205   Recent Lipid Panel Lab Results  Component Value Date/Time   CHOL 131 04/02/2020 02:07 PM   TRIG 114 04/02/2020 02:07 PM   HDL 56 04/02/2020 02:07 PM   LDLCALC 55 04/02/2020 02:07 PM      Risk Assessment/Calculations:    CHA2DS2-VASc Score = 5  This indicates a 7.2% annual risk of stroke. The patient's score is based upon: CHF History: No HTN History: No Diabetes History: No Stroke History: Yes Vascular Disease History: No Age Score: 2 Gender Score: 1    Physical Exam:    VS:  BP 128/60   Pulse 70   Ht 5\' 4"  (1.626 m)   Wt 138 lb 12.8 oz (63 kg)   SpO2 93%   BMI 23.82 kg/m     Wt Readings from Last 3 Encounters:  03/11/21 138 lb 12.8 oz (63 kg)  01/24/21 142 lb (64.4 kg)  06/04/20 141 lb 3.2 oz (64 kg)     Constitutional:      Appearance: Healthy appearance. Not in distress.  Neck:     Vascular: JVD normal.  Pulmonary:     Effort: Pulmonary effort is normal.     Breath sounds: No wheezing. No rales.  Cardiovascular:     Normal rate. Irregularly irregular rhythm. Normal S1. Normal S2.      Murmurs: There is no murmur.  Edema:    Peripheral edema present.    Ankle: bilateral 1+ edema of the ankle. Abdominal:     Palpations: Abdomen is soft.  Skin:    General: Skin is warm and dry.  Neurological:     Mental Status: Alert and oriented to person, place and time.     Cranial Nerves: Cranial nerves are intact.         ASSESSMENT & PLAN:    1. Permanent atrial fibrillation ([place]) 2. Long term current use of anticoagulant therapy Heart rate is well controlled.  INR today was therapeutic.   Continue follow-up with Coumadin clinic for management of her Coumadin.  Continue diltiazem 180 mg daily, metoprolol succinate 25 mg daily.  Follow-up 1 year.  3. Mixed hyperlipidemia LDL optimal on most recent lab work.  4. History of falling She fell last month and went to the emergency room.  Her head CT did not demonstrate any intracranial bleeding.  She did have evidence of an old right MCA infarct.  Her  niece did ask if we should do a follow-up CT.  The patient is not having any headaches, nausea, confusion.  At this point, I do not think a follow-up head CT is necessary.  She is comfortable with this approach.  I did have a question of whether or not the patient had a syncopal episode.  However, her family questioned her right after she fell and the patient remembered falling.  There does not seem to be any evidence of a syncopal episode.   Dispo:  Return in about 1 year (around 03/11/2022) for Routine follow up in 1 year With [facility]. .   Medication Adjustments/Labs and Tests Ordered: Current medicines are reviewed at length with the patient today.  Concerns regarding medicines are outlined above.  Tests Ordered: Orders Placed This Encounter  Procedures   EKG 12-Lead   Medication Changes: No orders of the defined types were placed in this encounter.   Signed, Richardson Dopp, PA-C  03/11/2021 4:28 PM    [facility] [address], [place], [place]  [address] Phone: [phone]; Fax: [phone]

## 2021-03-11 NOTE — Patient Instructions (Signed)
Description   Continue taking Warfarin 1/2 tablet everyday except 1 tablet on Tuesday.  Recheck INR in 4 weeks. Coumadin Clinic # [phone] (prefers 4 weeks between visits)

## 2021-03-11 NOTE — Patient Instructions (Signed)
Medication Instructions:   Your physician recommends that you continue on your current medications as directed. Please refer to the Current Medication list given to you today.  *If you need a refill on your cardiac medications before your next appointment, please call your pharmacy*   Lab Work:  -NONE  If you have labs (blood work) drawn today and your tests are completely normal, you will receive your results only by: [place] (if you have MyChart) OR A paper copy in the mail If you have any lab test that is abnormal or we need to change your treatment, we will call you to review the results.   Testing/Procedures:  -NONE   Follow-Up: At [facility], you and your health needs are our priority.  As part of our continuing mission to provide you with exceptional heart care, we have created designated Provider Care Teams.  These Care Teams include your primary Cardiologist (physician) and Advanced Practice Providers (APPs -  Physician Assistants and Nurse Practitioners) who all work together to provide you with the care you need, when you need it.  We recommend signing up for the patient portal called "MyChart".  Sign up information is provided on this After Visit Summary.  MyChart is used to connect with patients for Virtual Visits (Telemedicine).  Patients are able to view lab/test results, encounter notes, upcoming appointments, etc.  Non-urgent messages can be sent to your provider as well.   To learn more about what you can do with MyChart, go to [email or web address].    Your next appointment:   1 year(s)  The format for your next appointment:   In Person  Provider:   Richardson Dopp, PA-C   Other Instructions Your physician wants you to follow-up in: 1 year with  Richardson Dopp, PA-C.  You will receive a reminder letter in the mail two months in advance. If you don't receive a letter, please call our office to schedule the follow-up appointment.

## 2021-04-08 ENCOUNTER — Other Ambulatory Visit: Payer: Self-pay

## 2021-04-08 ENCOUNTER — Ambulatory Visit (INDEPENDENT_AMBULATORY_CARE_PROVIDER_SITE_OTHER): Payer: Medicare Other

## 2021-04-08 DIAGNOSIS — Z5181 Encounter for therapeutic drug level monitoring: Secondary | ICD-10-CM

## 2021-04-08 DIAGNOSIS — Z7901 Long term (current) use of anticoagulants: Secondary | ICD-10-CM

## 2021-04-08 DIAGNOSIS — I4891 Unspecified atrial fibrillation: Secondary | ICD-10-CM | POA: Diagnosis not present

## 2021-04-08 DIAGNOSIS — K55069 Acute infarction of intestine, part and extent unspecified: Secondary | ICD-10-CM | POA: Diagnosis not present

## 2021-04-08 LAB — POCT INR: INR: 1.9 — AB (ref 2.0–3.0)

## 2021-04-08 NOTE — Patient Instructions (Signed)
Take 1 tablet today only and then Continue taking Warfarin 1/2 tablet everyday except 1 tablet on Tuesday.  Recheck INR in 4 weeks. Coumadin Clinic # [phone] (prefers 4 weeks between visits)

## 2021-05-06 ENCOUNTER — Other Ambulatory Visit: Payer: Self-pay

## 2021-05-06 ENCOUNTER — Ambulatory Visit (INDEPENDENT_AMBULATORY_CARE_PROVIDER_SITE_OTHER): Payer: Medicare Other

## 2021-05-06 DIAGNOSIS — Z7901 Long term (current) use of anticoagulants: Secondary | ICD-10-CM | POA: Diagnosis not present

## 2021-05-06 DIAGNOSIS — Z5181 Encounter for therapeutic drug level monitoring: Secondary | ICD-10-CM | POA: Diagnosis not present

## 2021-05-06 DIAGNOSIS — I4891 Unspecified atrial fibrillation: Secondary | ICD-10-CM

## 2021-05-06 DIAGNOSIS — K55069 Acute infarction of intestine, part and extent unspecified: Secondary | ICD-10-CM

## 2021-05-06 LAB — POCT INR: INR: 1.9 — AB (ref 2.0–3.0)

## 2021-05-06 NOTE — Patient Instructions (Signed)
Take 1 tablet today only and then Continue taking Warfarin 1/2 tablet everyday except 1 tablet on Tuesday.  Recheck INR in 4 weeks. Coumadin Clinic # [phone] (prefers 4 weeks between visits)

## 2021-05-14 DIAGNOSIS — Z85820 Personal history of malignant melanoma of skin: Secondary | ICD-10-CM | POA: Diagnosis not present

## 2021-05-14 DIAGNOSIS — L812 Freckles: Secondary | ICD-10-CM | POA: Diagnosis not present

## 2021-05-14 DIAGNOSIS — L821 Other seborrheic keratosis: Secondary | ICD-10-CM | POA: Diagnosis not present

## 2021-05-14 DIAGNOSIS — D2221 Melanocytic nevi of right ear and external auricular canal: Secondary | ICD-10-CM | POA: Diagnosis not present

## 2021-05-14 DIAGNOSIS — D225 Melanocytic nevi of trunk: Secondary | ICD-10-CM | POA: Diagnosis not present

## 2021-05-14 DIAGNOSIS — Z85828 Personal history of other malignant neoplasm of skin: Secondary | ICD-10-CM | POA: Diagnosis not present

## 2021-05-18 ENCOUNTER — Other Ambulatory Visit: Payer: Self-pay

## 2021-05-18 MED ORDER — METOPROLOL SUCCINATE ER 25 MG PO TB24: 25.0000 mg | ORAL_TABLET | Freq: Every day | ORAL | 3 refills | Status: DC

## 2021-05-18 MED ORDER — DILTIAZEM HCL ER COATED BEADS 180 MG PO CP24: 180.0000 mg | ORAL_CAPSULE | Freq: Every day | ORAL | 3 refills | Status: DC

## 2021-06-02 ENCOUNTER — Other Ambulatory Visit: Payer: Self-pay

## 2021-06-02 ENCOUNTER — Ambulatory Visit (INDEPENDENT_AMBULATORY_CARE_PROVIDER_SITE_OTHER)
Admission: RE | Admit: 2021-06-02 | Discharge: 2021-06-02 | Disposition: A | Discharge status: Home / Self Care | Payer: Medicare Other | Source: Ambulatory Visit | Attending: Family Medicine | Admitting: Family Medicine

## 2021-06-02 ENCOUNTER — Encounter: Payer: Self-pay | Admitting: Family Medicine

## 2021-06-02 ENCOUNTER — Ambulatory Visit (INDEPENDENT_AMBULATORY_CARE_PROVIDER_SITE_OTHER): Payer: Medicare Other | Admitting: Family Medicine

## 2021-06-02 VITALS — BP 120/76 | HR 51 | Temp 97.8°F | Ht 64.0 in | Wt 141.8 lb

## 2021-06-02 DIAGNOSIS — I4891 Unspecified atrial fibrillation: Secondary | ICD-10-CM | POA: Diagnosis not present

## 2021-06-02 DIAGNOSIS — R053 Chronic cough: Secondary | ICD-10-CM

## 2021-06-02 DIAGNOSIS — K55069 Acute infarction of intestine, part and extent unspecified: Secondary | ICD-10-CM

## 2021-06-02 DIAGNOSIS — I10 Essential (primary) hypertension: Secondary | ICD-10-CM | POA: Diagnosis not present

## 2021-06-02 DIAGNOSIS — E785 Hyperlipidemia, unspecified: Secondary | ICD-10-CM

## 2021-06-02 DIAGNOSIS — Z23 Encounter for immunization: Secondary | ICD-10-CM | POA: Diagnosis not present

## 2021-06-02 DIAGNOSIS — K219 Gastro-esophageal reflux disease without esophagitis: Secondary | ICD-10-CM

## 2021-06-02 DIAGNOSIS — E559 Vitamin D deficiency, unspecified: Secondary | ICD-10-CM

## 2021-06-02 LAB — LIPID PANEL
Cholesterol: 114 mg/dL (ref 0–200)
HDL: 56.4 mg/dL (ref >39.00)
LDL Cholesterol: 34 mg/dL (ref 0–99)
NonHDL: 57.9
Total CHOL/HDL Ratio: 2
Triglycerides: 122 mg/dL (ref 0.0–149.0)
VLDL: 24.4 mg/dL (ref 0.0–40.0)

## 2021-06-02 LAB — CBC WITH DIFFERENTIAL/PLATELET
Basophils Absolute: 0 10*3/uL (ref 0.0–0.1)
Basophils Relative: 0.5 % (ref 0.0–3.0)
Eosinophils Absolute: 0.1 10*3/uL (ref 0.0–0.7)
Eosinophils Relative: 1.7 % (ref 0.0–5.0)
HCT: 40.4 % (ref 36.0–46.0)
Hemoglobin: 13.4 g/dL (ref 12.0–15.0)
Lymphocytes Relative: 30.2 % (ref 12.0–46.0)
Lymphs Abs: 1.6 10*3/uL (ref 0.7–4.0)
MCHC: 33.2 g/dL (ref 30.0–36.0)
MCV: 93.9 fl (ref 78.0–100.0)
Monocytes Absolute: 0.6 10*3/uL (ref 0.1–1.0)
Monocytes Relative: 10.6 % (ref 3.0–12.0)
Neutro Abs: 3.1 10*3/uL (ref 1.4–7.7)
Neutrophils Relative %: 57 % (ref 43.0–77.0)
Platelets: 189 10*3/uL (ref 150.0–400.0)
RBC: 4.3 Mil/uL (ref 3.87–5.11)
RDW: 13.7 % (ref 11.5–15.5)
WBC: 5.4 10*3/uL (ref 4.0–10.5)

## 2021-06-02 LAB — COMPREHENSIVE METABOLIC PANEL
ALT: 14 U/L (ref 0–35)
AST: 23 U/L (ref 0–37)
Albumin: 4 g/dL (ref 3.5–5.2)
Alkaline Phosphatase: 55 U/L (ref 39–117)
BUN: 15 mg/dL (ref 6–23)
CO2: 31 mEq/L (ref 19–32)
Calcium: 9.1 mg/dL (ref 8.4–10.5)
Chloride: 102 mEq/L (ref 96–112)
Creatinine, Ser: 0.68 mg/dL (ref 0.40–1.20)
GFR: 76.23 mL/min (ref >60.00)
Glucose, Bld: 88 mg/dL (ref 70–99)
Potassium: 4 mEq/L (ref 3.5–5.1)
Sodium: 140 mEq/L (ref 135–145)
Total Bilirubin: 0.6 mg/dL (ref 0.2–1.2)
Total Protein: 6.5 g/dL (ref 6.0–8.3)

## 2021-06-02 LAB — VITAMIN D 25 HYDROXY (VIT D DEFICIENCY, FRACTURES): VITD: 57.2 ng/mL (ref 30.00–100.00)

## 2021-06-02 MED ORDER — FLUTICASONE PROPIONATE 50 MCG/ACT NA SUSP: 2.0000 | Freq: Every day | NASAL | 3 refills | Status: DC

## 2021-06-02 NOTE — Patient Instructions (Addendum)
Health Maintenance Due  Topic Date Due   COVID-19 Vaccine (3 - Booster for [employer] series) - Recommend getting Omicron/Bivalent booster only at your local pharmacy! Please let us know when you have received this vaccination.  03/10/2020   INFLUENZA VACCINE  -High dose flu shot 03/23/2021   Please stop by lab before you go If you have mychart- we will send your results within 3 business days of [place] receiving them.  If you do not have mychart- we will call you about results within 5 business days of [place] receiving them.  *please also note that you will see labs on mychart as soon as they post. I will later go in and write notes on them- will say "notes from Dr. Yong Channel"  With concerns of future falls/injuries, I recommend you wearing a medical alerting system around your neck.  I recommend you start using your walking cane or walker more regularly or to consider Home Health Physical Therapy coming out to you.  Try Flonase daily for the next two weeks to se if there will be any improvement with your cough. Please be careful with this nasal spray as it can cause nose bleeds.  Please go to [facility]  central X-ray (updated 10/18/2019) - located [address] across the street from [place] - in the basement - Hours: 8:30-5:00 PM M-F (with lunch from 12:30- 1 PM). You do NOT need an appointment.  - Please ensure you are covid symptom free before going in(No fever, chills, cough, congestion, runny nose, shortness of breath, fatigue, body aches, sore throat, headache, nausea, vomiting, diarrhea, or new loss of taste or smell. No known contacts with covid 19 or someone being tested for covid 19)  Recommended follow up: Return in about 6 months (around 12/01/2021) for a follow-up or sooner if you experience anymore falls.

## 2021-06-02 NOTE — Progress Notes (Signed)
Phone [phone] In person visit   Subjective:   Lisa Douglas is a 87 y.o. year old very pleasant female patient who presents for/with See problem oriented charting Chief Complaint  Patient presents with   Hypertension   Hyperlipidemia    This visit occurred during the SARS-CoV-2 public health emergency.  Safety protocols were in place, including screening questions prior to the visit, additional usage of staff PPE, and extensive cleaning of exam room while observing appropriate contact time as indicated for disinfecting solutions.   Past Medical History-  Patient Active Problem List   Diagnosis Date Noted   ATRIAL FIBRILLATION, CHRONIC 10/03/2007    Priority: 1.   Aortic atherosclerosis ([place]) 10/31/2018    Priority: 2.   Essential hypertension 04/27/2016    Priority: 2.   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   Anxiety state 10/03/2007    Priority: 2.   Advanced nonexudative age-related macular degeneration of both eyes without subfoveal involvement 10/03/2007    Priority: 2.   Osteoporosis 10/03/2007    Priority: 2.   Hyperlipidemia 10/02/2007    Priority: 2.   Angiodysplasia of colon 06/01/2004    Priority: 2.   Osteoarthritis of left knee 10/26/2016    Priority: 3.   Epigastric hernia 12/23/2014    Priority: 3.   Diverticulitis of colon 06/12/2014    Priority: 3.   CKD (chronic kidney disease), stage II 03/26/2014    Priority: 3.   Long term current use of anticoagulant therapy 03/20/2012    Priority: 3.   Leg cramps 12/30/2011    Priority: 3.   Vitamin D deficiency 04/01/2011    Priority: 3.   VENOUS INSUFFICIENCY 10/03/2007    Priority: 3.   GERD 10/03/2007    Priority: 3.   DEGENERATIVE JOINT DISEASE 10/03/2007    Priority: 3.   Pseudophakia of both eyes 01/26/2018   Posterior vitreous detachment, bilateral 05/25/2016   Nuclear cataract of both eyes 11/08/2014   Macular hole 08/31/2011    Medications- reviewed  and updated Current Outpatient Medications  Medication Sig Dispense Refill   Cholecalciferol (VITAMIN D) 2000 UNITS CAPS Take 2,000 Units by mouth daily.     diltiazem (CARDIZEM CD) 180 MG 24 hr capsule Take 1 capsule (180 mg total) by mouth daily. 90 capsule 3   fluticasone (FLONASE) 50 MCG/ACT nasal spray Place 2 sprays into both nostrils daily. 16 g 3   metoprolol succinate (TOPROL-XL) 25 MG 24 hr tablet Take 1 tablet (25 mg total) by mouth daily. 90 tablet 3   Multiple Vitamin (MULTIVITAMIN WITH MINERALS) TABS Take 1 tablet by mouth daily.     Multiple Vitamins-Minerals (PRESERVISION AREDS) CAPS Take 1 capsule by mouth in the morning and at bedtime.     simvastatin (ZOCOR) 20 MG tablet Take 1 tablet (20 mg total) by mouth at bedtime. 90 tablet 3   warfarin (COUMADIN) 3 MG tablet Take 1 to 2 tablets daily as directed by the coumadin clinic 45 tablet 6   omeprazole (PRILOSEC) 20 MG capsule Take 20 mg by mouth as needed (for heartburn or indigestion). (Patient not taking: Reported on 06/02/2021)     No current facility-administered medications for this visit.     Objective:  BP 120/76   Pulse (!) 51   Temp 97.8 F (36.6 C) (Temporal)   Ht 5\' 4"  (1.626 m)   Wt 141 lb 12.8 oz (64.3 kg)   SpO2 98%   BMI 24.34 kg/m  Gen: NAD, resting comfortably CV: regular hear trate no murmurs rubs or gallops Lungs: CTAB no crackles, wheeze, rhonchi Ext: Trace edema right greater than left Skin: warm, dry    Assessment and Plan  # History of Fall S:Patient did have a fall and suffered from facial injury back on 01/24/2021 and was seen in the ED. Head CT did not show any intracranial bleeding/abnormalities - there was evidence of an old right MCA infarct and bilateral nasal bone fractures were noted. Patient was provided an ENT follow-up approximately 2 weeks after if needed for nose problems. ENT follow-up on 01/29/2021 found that patient had significant facial and periorbital ecchymosis on PE.  Nasal dorsum was midline and there was no evidence of septal hematoma order placed nasal fracture. No malocclusion or diplopia. Patient was reassured regarding her findings and no surgical intervention was required.  Patient was then seen by Richardson Dopp, PA-C, from Cardiology on 03/11/2021 and he did not think that is was necessary to have a follow-up head CT - there where question if patient had a syncopal episode but her family did question her after the fall and she did remember falling - so there was no evidence for this.  Today patient reports no recurrent issues thankfully.   A/P: fall several months ago without recurrence. Unclear what triggered this but also not clearly syncope- discussed possible syncope workup- but in the end after discussion we jointly agreed to pursue this only if recurrence. Did recommend medical alert   Prior CVA noted but may have been related to a fib- now on coumadin    # Atrial fibrillation/also with history superior mesenteric artery thrombosis- secondary indication for long term coumadin S:rate controlled on metoprolol 25 mg XR and diltiazem 180mg  XR. Anticoagulated on coumadin through cardiology A/P: appropriately anticoagulated and rate controlled (HR usually 70 to 80 so will not make changes)  -chronic coumadin prevents recurrence of SMA thrombosis as well- continue coumadin   # Hypertension S:medication: compliant with metoprolol 25 mg XR, diltiazem 180mg  XR  BP Readings from Last 3 Encounters:  06/02/21 120/76  03/11/21 128/60  01/24/21 (!) 145/67  A/P: Controlled. Continue current medications.    # Hyperlipidemia/aortic atherosclerosis/hx of stroke S:medication: compliant with simvastatin 20mg . Aortic atherosclerosis noted 01/28/15 on CT scan Lab Results  Component Value Date   CHOL 131 04/02/2020   HDL 56 04/02/2020   LDLCALC 55 04/02/2020   TRIG 114 04/02/2020   CHOLHDL 2.3 04/02/2020  A/P: LDL at ideal goal last year for history of stroke  and aortic atherosclerosi-we will update today to confirm still in similar situation  # Osteoporosis/vitamin D deficiency S: Patient did not tolerate fosamax. Does not want injectables like prolia or reclast. She is on vitamin D (she opted not to take calcium). Declineds bone density retest as would not want medication.  - discussed importance of avoiding falls (talked about HH and walker/cane- declines) Last vitamin D Lab Results  Component Value Date   VD25OH 53.56 10/31/2018  A/P: known osteoporosis- discussed dexa- declines as well as cane/walker/HH for prevention  #GERD/cough S: very sparing use of omeprazole 20mg - effective when she uses- doesn't use much  Has some nasal congestion. Sneezes some. Has a pill for allergies but not using. Cough for years back to 2016.  A/P: ongoing cough- could be related to reflux- could use omeprazole more regularly. Also could have allergies- discussed flonase trial  - CXR 2017 but will repeat at this time as well  Recommended follow up:  Return in about 6 months (around 12/01/2021) for a follow-up or sooner if you experience anymore falls. Future Appointments  Date Time Provider [place]  06/03/2021  2:15 PM [facility]   Lab/Order associations:   ICD-10-CM   1. Essential hypertension  I10     2. Hyperlipidemia, unspecified hyperlipidemia type  E78.5 CBC with Differential/Platelet    Comprehensive metabolic panel    Lipid panel    3. Vitamin D deficiency  E55.9 VITAMIN D 25 Hydroxy (Vit-D Deficiency, Fractures)    4. Gastroesophageal reflux disease without esophagitis  K21.9     5. Atrial fibrillation, unspecified type ([place])  I48.91     6. 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 Chronic cough  R05.3 DG Chest 2 View      Meds ordered this encounter  Medications   fluticasone (FLONASE) 50 MCG/ACT nasal spray    Sig: Place 2 sprays into both nostrils daily.     Dispense:  16 g    Refill:  3    Stop if any nosebleeds    I,Harris Phan,acting as a scribe for Garret Reddish, MD.,have documented all relevant documentation on the behalf of Garret Reddish, MD,as directed by  Garret Reddish, MD while in the presence of Garret Reddish, MD.   I, Garret Reddish, MD, have reviewed all documentation for this visit. The documentation on 06/02/21 for the exam, diagnosis, procedures, and orders are all accurate and complete.   Return precautions advised.  Garret Reddish, MD

## 2021-06-03 ENCOUNTER — Ambulatory Visit (INDEPENDENT_AMBULATORY_CARE_PROVIDER_SITE_OTHER): Payer: Medicare Other

## 2021-06-03 DIAGNOSIS — I4891 Unspecified atrial fibrillation: Secondary | ICD-10-CM

## 2021-06-03 DIAGNOSIS — Z5181 Encounter for therapeutic drug level monitoring: Secondary | ICD-10-CM

## 2021-06-03 DIAGNOSIS — Z7901 Long term (current) use of anticoagulants: Secondary | ICD-10-CM | POA: Diagnosis not present

## 2021-06-03 DIAGNOSIS — K55069 Acute infarction of intestine, part and extent unspecified: Secondary | ICD-10-CM

## 2021-06-03 LAB — POCT INR: INR: 2.1 (ref 2.0–3.0)

## 2021-06-03 NOTE — Patient Instructions (Signed)
Continue taking Warfarin 1/2 tablet everyday except 1 tablet on Tuesday.  Recheck INR in 4 weeks. Coumadin Clinic # [phone] (prefers 4 weeks between visits)

## 2021-06-06 ENCOUNTER — Other Ambulatory Visit: Payer: Self-pay | Admitting: Family Medicine

## 2021-07-01 ENCOUNTER — Other Ambulatory Visit: Payer: Self-pay

## 2021-07-01 ENCOUNTER — Ambulatory Visit (INDEPENDENT_AMBULATORY_CARE_PROVIDER_SITE_OTHER): Payer: Medicare Other

## 2021-07-01 DIAGNOSIS — Z7901 Long term (current) use of anticoagulants: Secondary | ICD-10-CM | POA: Diagnosis not present

## 2021-07-01 DIAGNOSIS — Z5181 Encounter for therapeutic drug level monitoring: Secondary | ICD-10-CM

## 2021-07-01 DIAGNOSIS — Z23 Encounter for immunization: Secondary | ICD-10-CM | POA: Diagnosis not present

## 2021-07-01 DIAGNOSIS — I4891 Unspecified atrial fibrillation: Secondary | ICD-10-CM | POA: Diagnosis not present

## 2021-07-01 DIAGNOSIS — K55069 Acute infarction of intestine, part and extent unspecified: Secondary | ICD-10-CM

## 2021-07-01 LAB — POCT INR: INR: 2.9 (ref 2.0–3.0)

## 2021-07-01 NOTE — Patient Instructions (Signed)
Hold tonight only and then Continue taking Warfarin 1/2 tablet everyday except 1 tablet on Tuesday.  Recheck INR in 3 weeks. Coumadin Clinic # [phone] (prefers 4 weeks between visits)

## 2021-07-10 ENCOUNTER — Telehealth: Payer: Self-pay

## 2021-07-10 NOTE — Telephone Encounter (Signed)
Patient called in stating she received pfizer 3rd booster on 07/01/21.

## 2021-07-10 NOTE — Telephone Encounter (Signed)
Vaccine added

## 2021-07-22 ENCOUNTER — Other Ambulatory Visit: Payer: Self-pay

## 2021-07-22 ENCOUNTER — Ambulatory Visit (INDEPENDENT_AMBULATORY_CARE_PROVIDER_SITE_OTHER): Payer: Medicare Other

## 2021-07-22 DIAGNOSIS — I4891 Unspecified atrial fibrillation: Secondary | ICD-10-CM | POA: Diagnosis not present

## 2021-07-22 DIAGNOSIS — K55069 Acute infarction of intestine, part and extent unspecified: Secondary | ICD-10-CM

## 2021-07-22 DIAGNOSIS — Z7901 Long term (current) use of anticoagulants: Secondary | ICD-10-CM | POA: Diagnosis not present

## 2021-07-22 DIAGNOSIS — Z5181 Encounter for therapeutic drug level monitoring: Secondary | ICD-10-CM | POA: Diagnosis not present

## 2021-07-22 LAB — POCT INR: INR: 2.2 (ref 2.0–3.0)

## 2021-07-22 NOTE — Patient Instructions (Signed)
Description   Continue taking Warfarin 1/2 tablet everyday except 1 tablet on Tuesday.  Recheck INR in 4 weeks. Coumadin Clinic # [phone]

## 2021-08-19 ENCOUNTER — Ambulatory Visit (INDEPENDENT_AMBULATORY_CARE_PROVIDER_SITE_OTHER): Payer: Medicare Other

## 2021-08-19 ENCOUNTER — Other Ambulatory Visit: Payer: Self-pay

## 2021-08-19 DIAGNOSIS — Z7901 Long term (current) use of anticoagulants: Secondary | ICD-10-CM

## 2021-08-19 DIAGNOSIS — I4891 Unspecified atrial fibrillation: Secondary | ICD-10-CM

## 2021-08-19 DIAGNOSIS — K55069 Acute infarction of intestine, part and extent unspecified: Secondary | ICD-10-CM | POA: Diagnosis not present

## 2021-08-19 LAB — POCT INR: INR: 3.4 — AB (ref 2.0–3.0)

## 2021-08-19 NOTE — Patient Instructions (Signed)
Description   Hold today's dose and eat greens and then continue taking Warfarin 1/2 tablet everyday except 1 tablet on Tuesday.  Recheck INR in 2 weeks. Coumadin Clinic # [phone]

## 2021-09-02 ENCOUNTER — Other Ambulatory Visit: Payer: Self-pay

## 2021-09-02 ENCOUNTER — Ambulatory Visit (INDEPENDENT_AMBULATORY_CARE_PROVIDER_SITE_OTHER): Payer: Medicare Other

## 2021-09-02 DIAGNOSIS — Z7901 Long term (current) use of anticoagulants: Secondary | ICD-10-CM | POA: Diagnosis not present

## 2021-09-02 DIAGNOSIS — Z5181 Encounter for therapeutic drug level monitoring: Secondary | ICD-10-CM

## 2021-09-02 DIAGNOSIS — K55069 Acute infarction of intestine, part and extent unspecified: Secondary | ICD-10-CM

## 2021-09-02 DIAGNOSIS — I4891 Unspecified atrial fibrillation: Secondary | ICD-10-CM | POA: Diagnosis not present

## 2021-09-02 DIAGNOSIS — M13862 Other specified arthritis, left knee: Secondary | ICD-10-CM | POA: Diagnosis not present

## 2021-09-02 LAB — POCT INR: INR: 3.4 — AB (ref 2.0–3.0)

## 2021-09-02 NOTE — Patient Instructions (Signed)
Hold today and Thursday dose and then decrease to 1/2 tablet everyday.  Recheck INR in 2 weeks. Coumadin Clinic # [phone]

## 2021-09-08 ENCOUNTER — Other Ambulatory Visit: Payer: Self-pay | Admitting: Family Medicine

## 2021-09-16 ENCOUNTER — Ambulatory Visit (INDEPENDENT_AMBULATORY_CARE_PROVIDER_SITE_OTHER): Payer: Medicare Other

## 2021-09-16 ENCOUNTER — Other Ambulatory Visit: Payer: Self-pay

## 2021-09-16 DIAGNOSIS — I4891 Unspecified atrial fibrillation: Secondary | ICD-10-CM | POA: Diagnosis not present

## 2021-09-16 DIAGNOSIS — Z7901 Long term (current) use of anticoagulants: Secondary | ICD-10-CM

## 2021-09-16 DIAGNOSIS — Z5181 Encounter for therapeutic drug level monitoring: Secondary | ICD-10-CM

## 2021-09-16 DIAGNOSIS — K55069 Acute infarction of intestine, part and extent unspecified: Secondary | ICD-10-CM

## 2021-09-16 LAB — POCT INR: INR: 1.7 — AB (ref 2.0–3.0)

## 2021-09-16 NOTE — Patient Instructions (Signed)
TAKE 1 TABLET TONIGHT ONLY and then Continue 1/2 tablet everyday.  Recheck INR in 3 weeks. Coumadin Clinic # [phone]

## 2021-09-17 ENCOUNTER — Telehealth: Payer: Self-pay | Admitting: Family Medicine

## 2021-09-17 NOTE — Telephone Encounter (Signed)
Copied from [place] [id number]. Topic: Medicare AWV >> Sep 17, 2021 11:07 AM Harris-Coley, Hannah Beat wrote: Reason for CRM: Left message for patient to schedule Annual Wellness Visit.  Please schedule with Nurse Health Advisor Charlott Rakes, RN at [facility].  Please call [phone] ask for [facility]

## 2021-09-28 ENCOUNTER — Other Ambulatory Visit: Payer: Self-pay

## 2021-09-28 ENCOUNTER — Ambulatory Visit (INDEPENDENT_AMBULATORY_CARE_PROVIDER_SITE_OTHER): Payer: Medicare Other

## 2021-09-28 DIAGNOSIS — Z0000 Encounter for general adult medical examination without abnormal findings: Secondary | ICD-10-CM | POA: Diagnosis not present

## 2021-09-28 NOTE — Patient Instructions (Addendum)
Lisa Douglas , Thank you for taking time to come for your Medicare Wellness Visit. I appreciate your ongoing commitment to your health goals. Please review the following plan we discussed and let me know if I can assist you in the future.   Screening recommendations/referrals: Colonoscopy: No longer required  Mammogram: No longer required  Bone Density: No longer required  Recommended yearly ophthalmology/optometry visit for glaucoma screening and checkup Recommended yearly dental visit for hygiene and checkup  Vaccinations: Influenza vaccine: Done 06/02/21 repeat every year  Pneumococcal vaccine: Up to date Tdap vaccine: Done 01/24/21  Shingles vaccine: Shingrix discussed. Please contact your pharmacy for coverage information.    Covid-19:Completed   Advanced directives: Advance directive discussed with you today. Even though you declined this today please call our office should you change your mind and we can give you the proper paperwork for you to fill out.   Conditions/risks identified: Get better balance   Next appointment: Follow up in one year for your annual wellness visit    Preventive Care 65 Years and Older, Female Preventive care refers to lifestyle choices and visits with your health care provider that can promote health and wellness. What does preventive care include? A yearly physical exam. This is also called an annual well check. Dental exams once or twice a year. Routine eye exams. Ask your health care provider how often you should have your eyes checked. Personal lifestyle choices, including: Daily care of your teeth and gums. Regular physical activity. Eating a healthy diet. Avoiding tobacco and drug use. Limiting alcohol use. Practicing safe sex. Taking low-dose aspirin every day. Taking vitamin and mineral supplements as recommended by your health care provider. What happens during an annual well check? The services and screenings done by your health care  provider during your annual well check will depend on your age, overall health, lifestyle risk factors, and family history of disease. Counseling  Your health care provider may ask you questions about your: Alcohol use. Tobacco use. Drug use. Emotional well-being. Home and relationship well-being. Sexual activity. Eating habits. History of falls. Memory and ability to understand (cognition). Work and work Statistician. Reproductive health. Screening  You may have the following tests or measurements: Height, weight, and BMI. Blood pressure. Lipid and cholesterol levels. These may be checked every 5 years, or more frequently if you are over 23 years old. Skin check. Lung cancer screening. You may have this screening every year starting at age 72 if you have a 30-pack-year history of smoking and currently smoke or have quit within the past 15 years. Fecal occult blood test (FOBT) of the stool. You may have this test every year starting at age 82. Flexible sigmoidoscopy or colonoscopy. You may have a sigmoidoscopy every 5 years or a colonoscopy every 10 years starting at age 46. Hepatitis C blood test. Hepatitis B blood test. Sexually transmitted disease (STD) testing. Diabetes screening. This is done by checking your blood sugar (glucose) after you have not eaten for a while (fasting). You may have this done every 1-3 years. Bone density scan. This is done to screen for osteoporosis. You may have this done starting at age 27. Mammogram. This may be done every 1-2 years. Talk to your health care provider about how often you should have regular mammograms. Talk with your health care provider about your test results, treatment options, and if necessary, the need for more tests. Vaccines  Your health care provider may recommend certain vaccines, such as: Influenza vaccine. This  is recommended every year. Tetanus, diphtheria, and acellular pertussis (Tdap, Td) vaccine. You may need a Td  booster every 10 years. Zoster vaccine. You may need this after age 35. Pneumococcal 13-valent conjugate (PCV13) vaccine. One dose is recommended after age 87. Pneumococcal polysaccharide (PPSV23) vaccine. One dose is recommended after age 76. Talk to your health care provider about which screenings and vaccines you need and how often you need them. This information is not intended to replace advice given to you by your health care provider. Make sure you discuss any questions you have with your health care provider. Document Released: 09/05/2015 Document Revised: 04/28/2016 Document Reviewed: 06/10/2015 Elsevier Interactive Patient Education  2017 [place] Prevention in the Home Falls can cause injuries. They can happen to people of all ages. There are many things you can do to make your home safe and to help prevent falls. What can I do on the outside of my home? Regularly fix the edges of walkways and driveways and fix any cracks. Remove anything that might make you trip as you walk through a door, such as a raised step or threshold. Trim any bushes or trees on the path to your home. Use bright outdoor lighting. Clear any walking paths of anything that might make someone trip, such as rocks or tools. Regularly check to see if handrails are loose or broken. Make sure that both sides of any steps have handrails. Any raised decks and porches should have guardrails on the edges. Have any leaves, snow, or ice cleared regularly. Use sand or salt on walking paths during winter. Clean up any spills in your garage right away. This includes oil or grease spills. What can I do in the bathroom? Use night lights. Install grab bars by the toilet and in the tub and shower. Do not use towel bars as grab bars. Use non-skid mats or decals in the tub or shower. If you need to sit down in the shower, use a plastic, non-slip stool. Keep the floor dry. Clean up any water that spills on the floor  as soon as it happens. Remove soap buildup in the tub or shower regularly. Attach bath mats securely with double-sided non-slip rug tape. Do not have throw rugs and other things on the floor that can make you trip. What can I do in the bedroom? Use night lights. Make sure that you have a light by your bed that is easy to reach. Do not use any sheets or blankets that are too big for your bed. They should not hang down onto the floor. Have a firm chair that has side arms. You can use this for support while you get dressed. Do not have throw rugs and other things on the floor that can make you trip. What can I do in the kitchen? Clean up any spills right away. Avoid walking on wet floors. Keep items that you use a lot in easy-to-reach places. If you need to reach something above you, use a strong step stool that has a grab bar. Keep electrical cords out of the way. Do not use floor polish or wax that makes floors slippery. If you must use wax, use non-skid floor wax. Do not have throw rugs and other things on the floor that can make you trip. What can I do with my stairs? Do not leave any items on the stairs. Make sure that there are handrails on both sides of the stairs and use them. Fix handrails that  are broken or loose. Make sure that handrails are as long as the stairways. Check any carpeting to make sure that it is firmly attached to the stairs. Fix any carpet that is loose or worn. Avoid having throw rugs at the top or bottom of the stairs. If you do have throw rugs, attach them to the floor with carpet tape. Make sure that you have a light switch at the top of the stairs and the bottom of the stairs. If you do not have them, ask someone to add them for you. What else can I do to help prevent falls? Wear shoes that: Do not have high heels. Have rubber bottoms. Are comfortable and fit you well. Are closed at the toe. Do not wear sandals. If you use a stepladder: Make sure that it is  fully opened. Do not climb a closed stepladder. Make sure that both sides of the stepladder are locked into place. Ask someone to hold it for you, if possible. Clearly mark and make sure that you can see: Any grab bars or handrails. First and last steps. Where the edge of each step is. Use tools that help you move around (mobility aids) if they are needed. These include: Canes. Walkers. Scooters. Crutches. Turn on the lights when you go into a dark area. Replace any light bulbs as soon as they burn out. Set up your furniture so you have a clear path. Avoid moving your furniture around. If any of your floors are uneven, fix them. If there are any pets around you, be aware of where they are. Review your medicines with your doctor. Some medicines can make you feel dizzy. This can increase your chance of falling. Ask your doctor what other things that you can do to help prevent falls. This information is not intended to replace advice given to you by your health care provider. Make sure you discuss any questions you have with your health care provider. Document Released: 06/05/2009 Document Revised: 01/15/2016 Document Reviewed: 09/13/2014 Elsevier Interactive Patient Education  2017 [employer].

## 2021-09-28 NOTE — Progress Notes (Signed)
Virtual Visit via Telephone Note  I connected with  Lisa Douglas on 09/28/21 at  2:30 PM EST by telephone and verified that I am speaking with the correct person using two identifiers.  Medicare Annual Wellness visit completed telephonically due to Covid-19 pandemic.   Persons participating in this call: This Health Coach and this patient.   Location: Patient: Home Provider: Office   I discussed the limitations, risks, security and privacy concerns of performing an evaluation and management service by telephone and the availability of in person appointments. The patient expressed understanding and agreed to proceed.  Unable to perform video visit due to video visit attempted and failed and/or patient does not have video capability.   Some vital signs may be absent or patient reported.   Willette Brace, LPN   Subjective:   Lisa Douglas is a 88 y.o. female who presents for Medicare Annual (Subsequent) preventive examination.  Review of Systems     Cardiac Risk Factors include: advanced age (>25men, >35 women);hypertension;dyslipidemia     Objective:    There were no vitals filed for this visit. There is no height or weight on file to calculate BMI.  Advanced Directives 09/28/2021 05/08/2019 05/02/2018 04/28/2017 06/30/2016 06/22/2015 03/03/2012  Does Patient Have a Medical Advance Directive? No Yes Yes Yes Yes Yes Patient does not have advance directive  Type of Advance Directive - Living will;Healthcare Power of Attorney Living will;Healthcare Power of [place];Living will [place];Living will -  Does patient want to make changes to medical advance directive? No - Patient declined No - Patient declined No - Patient declined - No - Patient declined No - Patient declined -  Copy of [place] in Chart? - No - copy requested No - copy requested - No - copy requested No - copy requested -  Would patient like  information on creating a medical advance directive? - - - - Yes - Educational materials given - -  Pre-existing out of facility DNR order (yellow form or pink MOST form) - - - - - - No    Current Medications (verified) Outpatient Encounter Medications as of 09/28/2021  Medication Sig   diltiazem (CARDIZEM CD) 180 MG 24 hr capsule Take 1 capsule (180 mg total) by mouth daily.   metoprolol succinate (TOPROL-XL) 25 MG 24 hr tablet Take 1 tablet (25 mg total) by mouth daily.   Multiple Vitamin (MULTIVITAMIN WITH MINERALS) TABS Take 1 tablet by mouth daily.   Multiple Vitamins-Minerals (PRESERVISION AREDS) CAPS Take 1 capsule by mouth in the morning and at bedtime.   simvastatin (ZOCOR) 20 MG tablet TAKE 1 TABLET BY MOUTH AT BEDTIME   warfarin (COUMADIN) 3 MG tablet Take 1 to 2 tablets daily as directed by the coumadin clinic   Cholecalciferol (VITAMIN D) 2000 UNITS CAPS Take 2,000 Units by mouth daily.   [DISCONTINUED] fluticasone (FLONASE) 50 MCG/ACT nasal spray Place 2 sprays into both nostrils daily.   [DISCONTINUED] omeprazole (PRILOSEC) 20 MG capsule Take 20 mg by mouth as needed (for heartburn or indigestion).   No facility-administered encounter medications on file as of 09/28/2021.    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 Not current problem.     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   TONSILLECTOMY  1943   Family History  Problem Relation Age of Onset   Hemochromatosis Father        tested in [place], denies history   Heart disease Sister    Breast cancer Sister        died 18, not sure when she had it   Lymphoma Sister    Social History   Socioeconomic History   Marital status: Single    Spouse name: Not on file   Number of children: Not on file   Years of education: Not on file   Highest education level: Not on file  Occupational History   Occupation: retired  Tobacco Use   Smoking status: Former    Packs/day: 0.25    Years: 2.00    Pack years: 0.50    Types: Cigarettes    Quit date: 08/24/1951    Years since quitting: 70.1   Smokeless tobacco: Never   Tobacco comments:    smoked at age 89 for a short time--a few months  Vaping Use   Vaping Use: Never used  Substance and Sexual Activity   Alcohol use: No   Drug use: No   Sexual activity: Not on file  Other Topics Concern   Not on file  Social History Narrative   Born and raised in [place]. Lived on [place] of [place].    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       Lives with college age student that she helped raise [name], her niece's younger daughter, graduating may 2017)   Never married. No kids. 1 sister living (out of 8), a lot of contact with nieces and nephes.       Hobbies: used to sew and knit (vision issues  now), used to bowl   Now enjoys computer time-keeps up with family, not a big tv time   Social Determinants of Radio broadcast assistant Strain: Low Risk    Difficulty of Paying Living Expenses: Not hard at all  Food Insecurity: No Food Insecurity   Worried About Charity fundraiser in the Last Year: Never true   Arboriculturist in the Last Year: Never true  Transportation Needs: No Transportation Needs   Lack of Transportation (Medical): No   Lack of Transportation (Non-Medical): No  Physical Activity: Inactive   Days of Exercise per Week: 0 days   Minutes of Exercise per Session: 0 min  Stress: No Stress Concern Present   Feeling of Stress : Not at all  Social  Connections: Socially Isolated   Frequency of Communication with Friends and Family: More than three times a week   Frequency of Social Gatherings with Friends and Family: Once a week   Attends Religious Services: Never   Marine scientist or Organizations: No   Attends Music therapist: Never   Marital Status: Never married    Tobacco Counseling Counseling given: Not Answered Tobacco comments: smoked at age 84 for a short time--a few months   Clinical Intake:  Pre-visit preparation completed: Yes  Pain : No/denies pain     BMI - recorded: 24.34 Nutritional Status: BMI of 19-24  Normal Nutritional Risks: None Diabetes: No  How often do you need to have someone help you when you read instructions, pamphlets, or other written materials from your doctor or pharmacy?: 1 - Never  Diabetic?no  Interpreter Needed?: No  Information entered by :: Charlott Rakes, LPN   Activities of Daily Living In your present state of health, do you have any difficulty performing the following activities: 09/28/2021  Hearing? Y  Comment HOH  Vision? N  Difficulty concentrating or making decisions? N  Walking or climbing stairs? N  Dressing or bathing? N  Doing errands, shopping? N  Preparing Food and  eating ? N  Using the Toilet? N  In the past six months, have you accidently leaked urine? Y  Comment wears a pad at night for precautions  Do you have problems with loss of bowel control? N  Managing your Medications? N  Managing your Finances? N  Housekeeping or managing your Housekeeping? N  Some recent data might be hidden    Patient Care Team: Marin Olp, MD as PCP - General (Family Medicine) Shon Hough, MD as Consulting Physician (Ophthalmology) Feliz Beam, MD as Referring Physician (Ophthalmology) Sharmon Revere as Physician Assistant (Cardiology)  Indicate any recent Medical Services you may have received from other than [facility] providers in the past year (date may be approximate).     Assessment:   This is a routine wellness examination for [facility]  Hearing/Vision screen Hearing Screening - Comments:: Pt denies any hearing issues  Vision Screening - Comments:: Pt stated she doesn't follow up at this time   Dietary issues and exercise activities discussed: Current Exercise Habits: The patient does not participate in regular exercise at present   Goals Addressed             This Visit's Progress    Patient Stated       Get better balance        Depression Screen PHQ 2/9 Scores 09/28/2021 06/02/2021 06/04/2020 05/08/2019 10/31/2018 05/02/2018 05/02/2018  PHQ - 2 Score 0 0 0 0 0 0 0  PHQ- 9 Score - - - - - 0 -    Fall Risk Fall Risk  09/28/2021 06/02/2021 06/04/2020 05/08/2019 10/31/2018  Falls in the past year? 1 1 0 0 0  Comment - - - - -  Number falls in past yr: 1 1 0 0 -  Injury with Fall? 1 1 0 0 -  Comment fell in the kitchen and had soreness - - - -  Risk for fall due to : Impaired vision;Impaired mobility;Impaired balance/gait Impaired mobility;Impaired balance/gait - - -  Follow up Falls prevention discussed Falls evaluation completed;Education provided - Falls evaluation completed;Education provided -    FALL RISK PREVENTION  PERTAINING TO THE HOME:  Any stairs in or around the home? Yes  If so, are  there any without handrails? No  Home free of loose throw rugs in walkways, pet beds, electrical cords, etc? Yes  Adequate lighting in your home to reduce risk of falls? Yes   ASSISTIVE DEVICES UTILIZED TO PREVENT FALLS:  Life alert? Yes  Use of a cane, walker or w/c? Yes  Grab bars in the bathroom? No  Shower chair or bench in shower? Yes  Elevated toilet seat or a handicapped toilet? No   TIMED UP AND GO:  Was the test performed? No .   Cognitive Function: MMSE - Mini Mental State Exam 04/28/2017  Not completed: (No Data)     6CIT Screen 09/28/2021 05/08/2019 04/28/2017  What Year? 0 points 0 points 0 points  What month? 0 points 0 points 0 points  What time? 0 points 0 points 0 points  Count back from 20 0 points 0 points 0 points  Months in reverse 0 points 0 points 0 points  Repeat phrase 0 points 0 points 0 points  Total Score 0 0 0    Immunizations Immunization History  Administered Date(s) Administered   Fluad Quad(high Dose 65+) 05/08/2019, 06/04/2020, 06/02/2021   Influenza Split 05/26/2011, 06/02/2012   Influenza Whole 05/15/2008, 05/21/2009, 05/27/2010   Influenza, High Dose Seasonal PF 04/27/2016, 04/28/2017, 05/02/2018   Influenza,inj,Quad PF,6+ Mos 05/09/2013, 05/23/2014, 04/29/2015   PFIZER Comirnaty(Gray Top)Covid-19 Tri-Sucrose Vaccine 07/24/2020   PFIZER(Purple Top)SARS-COV-2 Vaccination 09/21/2019, 10/12/2019   Pfizer Covid-19 Vaccine Bivalent Booster 48yrs & up 07/01/2021   Pneumococcal Conjugate-13 10/29/2014   Pneumococcal Polysaccharide-23 11/03/2012   Td 03/31/2010   Tdap 01/24/2021   Zoster, Live 09/24/2011    TDAP status: Up to date  Flu Vaccine status: Up to date  Pneumococcal vaccine status: Up to date  Covid-19 vaccine status: Completed vaccines  Qualifies for Shingles Vaccine? Yes   Zostavax completed Yes   Shingrix Completed?: No.    Education has been  provided regarding the importance of this vaccine. Patient has been advised to call insurance company to determine out of pocket expense if they have not yet received this vaccine. Advised may also receive vaccine at local pharmacy or Health Dept. Verbalized acceptance and understanding.  Screening Tests Health Maintenance  Topic Date Due   Zoster Vaccines- Shingrix (1 of 2) 12/26/2021 (Originally [birth year]/1980)   TETANUS/TDAP  01/25/2031   Pneumonia Vaccine 43+ Years old  Completed   INFLUENZA VACCINE  Completed   DEXA SCAN  Completed   COVID-19 Vaccine  Completed   HPV VACCINES  Aged Out    Health Maintenance  There are no preventive care reminders to display for this patient.   Colorectal cancer screening: No longer required.   Mammogram status: No longer required due to age .  Bone Density status: Completed 05/28/10. Results reflect: Bone density results: OSTEOPOROSIS. Repeat every as directed by pcp years.   Additional Screening:  Vision Screening: Recommended annual ophthalmology exams for early detection of glaucoma and other disorders of the eye. Is the patient up to date with their annual eye exam?  No  Who is the provider or what is the name of the office in which the patient attends annual eye exams? [facility] is where she goes at times  If pt is not established with a provider, would they like to be referred to a provider to establish care? No .   Dental Screening: Recommended annual dental exams for proper oral hygiene  Community Resource Referral / Chronic Care Management: CRR required this visit?  No  CCM required this visit?  No      Plan:     I have personally reviewed and noted the following in the patients chart:   Medical and social history Use of alcohol, tobacco or illicit drugs  Current medications and supplements including opioid prescriptions.  Functional ability and status Nutritional status Physical activity Advanced directives List of  other physicians Hospitalizations, surgeries, and ER visits in previous 12 months Vitals Screenings to include cognitive, depression, and falls Referrals and appointments  In addition, I have reviewed and discussed with patient certain preventive protocols, quality metrics, and best practice recommendations. A written personalized care plan for preventive services as well as general preventive health recommendations were provided to patient.     Willette Brace, LPN   04/29/7413   Nurse Notes: none

## 2021-10-07 ENCOUNTER — Ambulatory Visit (INDEPENDENT_AMBULATORY_CARE_PROVIDER_SITE_OTHER): Payer: Medicare Other

## 2021-10-07 ENCOUNTER — Other Ambulatory Visit: Payer: Self-pay

## 2021-10-07 DIAGNOSIS — K55069 Acute infarction of intestine, part and extent unspecified: Secondary | ICD-10-CM

## 2021-10-07 DIAGNOSIS — Z7901 Long term (current) use of anticoagulants: Secondary | ICD-10-CM | POA: Diagnosis not present

## 2021-10-07 DIAGNOSIS — I4891 Unspecified atrial fibrillation: Secondary | ICD-10-CM | POA: Diagnosis not present

## 2021-10-07 DIAGNOSIS — Z5181 Encounter for therapeutic drug level monitoring: Secondary | ICD-10-CM | POA: Diagnosis not present

## 2021-10-07 LAB — POCT INR: INR: 1.9 — AB (ref 2.0–3.0)

## 2021-10-07 NOTE — Patient Instructions (Signed)
TAKE 1 TABLET TONIGHT ONLY and then Continue 1/2 tablet everyday.  Recheck INR in 4 weeks. Coumadin Clinic # [phone]

## 2021-10-30 NOTE — Progress Notes (Incomplete)
Phone [phone] In person visit   Subjective:   Lisa Douglas is a 88 y.o. year old very pleasant female patient who presents for/with See problem oriented charting No chief complaint on file.   This visit occurred during the SARS-CoV-2 public health emergency.  Safety protocols were in place, including screening questions prior to the visit, additional usage of staff PPE, and extensive cleaning of exam room while observing appropriate contact time as indicated for disinfecting solutions.   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   Leg cramps 12/30/2011   Macular hole 08/31/2011   Vitamin D deficiency 04/01/2011   Anxiety state 10/03/2007   Advanced nonexudative age-related macular degeneration of both eyes without subfoveal involvement 10/03/2007   ATRIAL FIBRILLATION, CHRONIC 10/03/2007   VENOUS INSUFFICIENCY 10/03/2007   GERD 10/03/2007   DEGENERATIVE JOINT DISEASE 10/03/2007   Osteoporosis 10/03/2007   Hyperlipidemia 10/02/2007   Angiodysplasia of colon 06/01/2004    Medications- reviewed and updated Current Outpatient Medications  Medication Sig Dispense Refill   Cholecalciferol (VITAMIN D) 2000 UNITS CAPS Take 2,000 Units by mouth daily.     diltiazem (CARDIZEM CD) 180 MG 24 hr capsule Take 1 capsule (180 mg total) by mouth daily. 90 capsule 3   metoprolol succinate (TOPROL-XL) 25 MG 24 hr tablet Take 1 tablet (25 mg total) by mouth daily. 90  tablet 3   Multiple Vitamin (MULTIVITAMIN WITH MINERALS) TABS Take 1 tablet by mouth daily.     Multiple Vitamins-Minerals (PRESERVISION AREDS) CAPS Take 1 capsule by mouth in the morning and at bedtime.     simvastatin (ZOCOR) 20 MG tablet TAKE 1 TABLET BY MOUTH AT BEDTIME 90 tablet 0   warfarin (COUMADIN) 3 MG tablet Take 1 to 2 tablets daily as directed by the coumadin clinic 45 tablet 6   No current facility-administered medications for this visit.     Objective:  There were no vitals taken for this visit. Gen: NAD, resting comfortably CV: RRR no murmurs rubs or gallops Lungs: CTAB no crackles, wheeze, rhonchi Abdomen: soft/nontender/nondistended/normal bowel sounds. No rebound or guarding.  Ext: no edema Skin: warm, dry Neuro: grossly normal, moves all extremities  ***    Assessment and Plan   05/08/2019 awv ***declines 2021  # Atrial fibrillation/also with history superior mesenteric artery thrombosis- secondary indication for long term coumadin S:rate controlled on metoprolol 25 mg XR and diltiazem '180mg'$  XR. Anticoagulated on coumadin through cardiology  A/P: ***   # Hypertension S:compliant with metoprolol 25 mg XR, diltiazem '180mg'$  XR  Home readings #s: *** BP Readings from Last 3 Encounters:  06/02/21 120/76  03/11/21 128/60  01/24/21 (!) 145/67  A/P: ***  # Hyperlipidemia/aortic atherosclerosis S:compliant with simvastatin '20mg'$ . Aortic atherosclerosis noted 01/28/15 on CT scan Lab Results  Component Value Date   CHOL 114 06/02/2021   HDL 56.40 06/02/2021   LDLCALC 34 06/02/2021   TRIG 122.0 06/02/2021   CHOLHDL 2 06/02/2021   A/P: ***  # Osteoporosis/vitamin D deficiency S: Patient did  not tolerate fosamax. Does not want injectables like prolia or reclast. She is on vitamin D (she opted not to take calcium). Declined bone density retest as would not want medication.  discussed importance of avoiding falls A/P:  # GERD S: very sparing use of nexium '20mg'$ -  effective when she uses.  B12 levels related to PPI use:  A/P: ***   # anxiety- sparing use of xanax- had not used xanax in 2020- used once in 2019. ***  There are no preventive care reminders to display for this patient.  Recommended follow up: No follow-ups on file. Future Appointments  Date Time Provider [place]  11/04/2021  3:00 PM [facility]  12/09/2021  9:40 AM Yong Channel Brayton Mars, MD [facility]    Lab/Order associations: No diagnosis found.  No orders of the defined types were placed in this encounter.   Return precautions advised.  Burnett Corrente

## 2021-11-04 ENCOUNTER — Other Ambulatory Visit: Payer: Self-pay

## 2021-11-04 ENCOUNTER — Ambulatory Visit (INDEPENDENT_AMBULATORY_CARE_PROVIDER_SITE_OTHER): Payer: Medicare Other

## 2021-11-04 DIAGNOSIS — Z7901 Long term (current) use of anticoagulants: Secondary | ICD-10-CM | POA: Diagnosis not present

## 2021-11-04 DIAGNOSIS — Z5181 Encounter for therapeutic drug level monitoring: Secondary | ICD-10-CM

## 2021-11-04 DIAGNOSIS — I4891 Unspecified atrial fibrillation: Secondary | ICD-10-CM

## 2021-11-04 DIAGNOSIS — K55069 Acute infarction of intestine, part and extent unspecified: Secondary | ICD-10-CM

## 2021-11-04 LAB — POCT INR: INR: 2 (ref 2.0–3.0)

## 2021-11-04 NOTE — Patient Instructions (Signed)
Continue 1/2 tablet everyday.  Recheck INR in 4 weeks. Coumadin Clinic # [phone]  ?

## 2021-11-28 ENCOUNTER — Other Ambulatory Visit: Payer: Self-pay | Admitting: Family Medicine

## 2021-11-30 ENCOUNTER — Ambulatory Visit (INDEPENDENT_AMBULATORY_CARE_PROVIDER_SITE_OTHER): Payer: Medicare Other

## 2021-11-30 DIAGNOSIS — K55069 Acute infarction of intestine, part and extent unspecified: Secondary | ICD-10-CM

## 2021-11-30 DIAGNOSIS — Z7901 Long term (current) use of anticoagulants: Secondary | ICD-10-CM | POA: Diagnosis not present

## 2021-11-30 DIAGNOSIS — Z5181 Encounter for therapeutic drug level monitoring: Secondary | ICD-10-CM | POA: Diagnosis not present

## 2021-11-30 DIAGNOSIS — I4891 Unspecified atrial fibrillation: Secondary | ICD-10-CM

## 2021-11-30 LAB — POCT INR: INR: 1.8 — AB (ref 2.0–3.0)

## 2021-11-30 NOTE — Patient Instructions (Signed)
Increase to 1/2 tablet everyday, except 1 tablet Wednesday.  Recheck INR in 2 weeks. Coumadin Clinic # [phone]  ?

## 2021-12-01 DIAGNOSIS — H35342 Macular cyst, hole, or pseudohole, left eye: Secondary | ICD-10-CM | POA: Diagnosis not present

## 2021-12-01 DIAGNOSIS — H35363 Drusen (degenerative) of macula, bilateral: Secondary | ICD-10-CM | POA: Diagnosis not present

## 2021-12-01 DIAGNOSIS — Z961 Presence of intraocular lens: Secondary | ICD-10-CM | POA: Diagnosis not present

## 2021-12-01 DIAGNOSIS — H43813 Vitreous degeneration, bilateral: Secondary | ICD-10-CM | POA: Diagnosis not present

## 2021-12-01 DIAGNOSIS — H353133 Nonexudative age-related macular degeneration, bilateral, advanced atrophic without subfoveal involvement: Secondary | ICD-10-CM | POA: Diagnosis not present

## 2021-12-09 ENCOUNTER — Ambulatory Visit (INDEPENDENT_AMBULATORY_CARE_PROVIDER_SITE_OTHER): Payer: Medicare Other | Admitting: Family Medicine

## 2021-12-09 ENCOUNTER — Encounter: Payer: Self-pay | Admitting: Family Medicine

## 2021-12-09 VITALS — BP 126/60 | HR 77 | Temp 97.9°F | Ht 64.0 in | Wt 137.2 lb

## 2021-12-09 DIAGNOSIS — K55069 Acute infarction of intestine, part and extent unspecified: Secondary | ICD-10-CM | POA: Diagnosis not present

## 2021-12-09 DIAGNOSIS — I10 Essential (primary) hypertension: Secondary | ICD-10-CM

## 2021-12-09 DIAGNOSIS — I4891 Unspecified atrial fibrillation: Secondary | ICD-10-CM

## 2021-12-09 DIAGNOSIS — K219 Gastro-esophageal reflux disease without esophagitis: Secondary | ICD-10-CM

## 2021-12-09 DIAGNOSIS — E785 Hyperlipidemia, unspecified: Secondary | ICD-10-CM

## 2021-12-09 MED ORDER — SIMVASTATIN 20 MG PO TABS
20.0000 mg | ORAL_TABLET | Freq: Every day | ORAL | 3 refills | Status: DC
  Filled 2022-06-09: qty 90, 90d supply, fill #0
  Filled 2022-09-06: qty 90, 90d supply, fill #1
  Filled 2022-11-30: qty 90, 90d supply, fill #2

## 2021-12-09 NOTE — Patient Instructions (Addendum)
You are doing fantastic once again. My main concern for your health is falls- really encourage using the walker or at least a cane to be ore stable ? ?Labs looked great last visit- we opted to hold off until next visit ? ?Recommended follow up: Return in about 6 months (around 06/10/2022) for followup or sooner if needed.Schedule b4 you leave. ?

## 2021-12-17 ENCOUNTER — Ambulatory Visit (INDEPENDENT_AMBULATORY_CARE_PROVIDER_SITE_OTHER): Payer: Medicare Other

## 2021-12-17 DIAGNOSIS — Z5181 Encounter for therapeutic drug level monitoring: Secondary | ICD-10-CM | POA: Diagnosis not present

## 2021-12-17 DIAGNOSIS — I4891 Unspecified atrial fibrillation: Secondary | ICD-10-CM | POA: Diagnosis not present

## 2021-12-17 DIAGNOSIS — Z7901 Long term (current) use of anticoagulants: Secondary | ICD-10-CM

## 2021-12-17 DIAGNOSIS — K55069 Acute infarction of intestine, part and extent unspecified: Secondary | ICD-10-CM | POA: Diagnosis not present

## 2021-12-17 LAB — POCT INR: INR: 2 (ref 2.0–3.0)

## 2021-12-17 NOTE — Patient Instructions (Signed)
Continue 1/2 tablet everyday, except 1 tablet Wednesday.  Recheck INR in 4 weeks. Coumadin Clinic # [phone].   

## 2021-12-22 ENCOUNTER — Other Ambulatory Visit: Payer: Self-pay | Admitting: Pharmacist Clinician (PhC)/ Clinical Pharmacy Specialist

## 2022-01-15 ENCOUNTER — Ambulatory Visit (INDEPENDENT_AMBULATORY_CARE_PROVIDER_SITE_OTHER): Payer: Medicare Other

## 2022-01-15 DIAGNOSIS — I4891 Unspecified atrial fibrillation: Secondary | ICD-10-CM | POA: Diagnosis not present

## 2022-01-15 DIAGNOSIS — Z5181 Encounter for therapeutic drug level monitoring: Secondary | ICD-10-CM

## 2022-01-15 DIAGNOSIS — K55069 Acute infarction of intestine, part and extent unspecified: Secondary | ICD-10-CM | POA: Diagnosis not present

## 2022-01-15 DIAGNOSIS — Z7901 Long term (current) use of anticoagulants: Secondary | ICD-10-CM | POA: Diagnosis not present

## 2022-01-15 LAB — POCT INR: INR: 2.3 (ref 2.0–3.0)

## 2022-01-15 NOTE — Patient Instructions (Signed)
Continue 1/2 tablet everyday, except 1 tablet Wednesday.  Recheck INR in 4 weeks. Coumadin Clinic # [phone].   

## 2022-02-12 ENCOUNTER — Ambulatory Visit (INDEPENDENT_AMBULATORY_CARE_PROVIDER_SITE_OTHER): Payer: Medicare Other

## 2022-02-12 DIAGNOSIS — Z5181 Encounter for therapeutic drug level monitoring: Secondary | ICD-10-CM

## 2022-02-12 DIAGNOSIS — I4891 Unspecified atrial fibrillation: Secondary | ICD-10-CM

## 2022-02-12 DIAGNOSIS — Z7901 Long term (current) use of anticoagulants: Secondary | ICD-10-CM | POA: Diagnosis not present

## 2022-02-12 DIAGNOSIS — K55069 Acute infarction of intestine, part and extent unspecified: Secondary | ICD-10-CM

## 2022-02-12 LAB — POCT INR: INR: 2.8 (ref 2.0–3.0)

## 2022-03-11 NOTE — Progress Notes (Signed)
Cardiology Office Note:    Date:  03/12/2022   ID:  Lisa Douglas, DOB 12-05-1927, MRN [id number]  PCP:  Marin Olp, MD  [place] Providers Cardiologist:  None Cardiology APP:  [facility]    Referring MD: Marin Olp, MD   Chief Complaint:  Follow-up for atrial fibrillation    Patient Profile: Permanent atrial fibrillation  Hyperlipidemia  Hx of SMA embolism requiring thrombectomy  Hx of GI bleeding (gastric AVMs) GERD Macular degeneration  Venous insufficiency  Hx of CVA (noted on CT in 6/22)  Prior CV Studies: Echocardiogram 03/04/2012: EF 60, normal wall motion, mild MR, mild BAE, PASP 42  History of Present Illness:   Lisa Douglas is a 88 y.o. female with the above problem list.  She was last seen in July 2022.  She returns for follow-up.  She is here with her great great niece.  Overall, she is doing well.  She has not had any further falls.  She uses a walker sometimes.  She continues to have lower extremity edema that resolves with elevation.  She has not had chest pain, syncope.  She does get short of breath with some activities.  Her breathing is overall stable.        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 Not current problem.     Angiodysplasia of intestine (without mention of hemorrhage)    Anxiety state, unspecified    Atrial fibrillation ([place])    Diverticulosis of colon (without mention of hemorrhage)    Diverticulosis of colon with hemorrhage 06/12/2014   Esophageal reflux    Macular degeneration (senile) of retina, unspecified    Mesenteric embolus (HCC)    Osteoarthrosis, unspecified whether generalized or localized, unspecified site    Osteoporosis, unspecified    Other and unspecified hyperlipidemia    Unspecified venous (peripheral) insufficiency    Current Medications: Current Meds  Medication Sig    Cholecalciferol (VITAMIN D) 2000 UNITS CAPS Take 2,000 Units by mouth daily.   diltiazem (CARDIZEM CD) 180 MG 24 hr capsule Take 1 capsule (180 mg total) by mouth daily.   metoprolol succinate (TOPROL-XL) 25 MG 24 hr tablet Take 1 tablet (25 mg total) by mouth daily.   Multiple Vitamin (MULTIVITAMIN WITH MINERALS) TABS Take 1 tablet by mouth daily.   Multiple Vitamins-Minerals (PRESERVISION AREDS) CAPS Take 1 capsule by mouth in the morning and at bedtime.   simvastatin (ZOCOR) 20 MG tablet Take 1 tablet (20 mg total) by mouth at bedtime.   warfarin (COUMADIN) 3 MG tablet TAKE 1 TO 2 TABLETS BY MOUTH AS DIRECTED ONCE DAILY BY  THE  COUMADIN  CLINIC    Allergies:   Penicillins, Alendronate sodium, Atorvastatin, and Morphine and related   Social History   Tobacco Use   Smoking status: Former    Packs/day: 0.25    Years: 2.00    Total pack years: 0.50    Types: Cigarettes    Quit date: 08/24/1951    Years since quitting: 70.5   Smokeless tobacco: Never   Tobacco comments:    smoked at age 16 for a short time--a few months  Vaping Use   Vaping Use: Never used  Substance Use Topics   Alcohol use: No   Drug use: No    Family Hx: The patient's family history includes Breast cancer in her sister; Heart disease  in her sister; Hemochromatosis in her father; Lymphoma in her sister.  Review of Systems  Gastrointestinal:  Negative for hematochezia and melena.  Genitourinary:  Negative for hematuria.     EKGs/Labs/Other Test Reviewed:    EKG:  EKG is  ordered today.  The ekg ordered today demonstrates atrial fibrillation, HR 65, normal axis, low voltage, QTc 393, no change from prior trace  Recent Labs: 06/02/2021: ALT 14; BUN 15; Creatinine, Ser 0.68; Hemoglobin 13.4; Platelets 189.0; Potassium 4.0; Sodium 140   Recent Lipid Panel Recent Labs    06/02/21 1035  CHOL 114  TRIG 122.0  HDL 56.40  VLDL 24.4  LDLCALC 34     Risk Assessment/Calculations/Metrics:    CHA2DS2-VASc  Score = 6   This indicates a 9.7% annual risk of stroke. The patient's score is based upon: CHF History: 0 HTN History: 1 Diabetes History: 0 Stroke History: 2 Vascular Disease History: 0 Age Score: 2 Gender Score: 1             Physical Exam:    VS:  BP 128/60   Pulse 65   Ht '5\' 4"'$  (1.626 m)   Wt 140 lb 3.2 oz (63.6 kg)   SpO2 95%   BMI 24.07 kg/m     Wt Readings from Last 3 Encounters:  03/12/22 140 lb 3.2 oz (63.6 kg)  12/09/21 137 lb 3.2 oz (62.2 kg)  06/02/21 141 lb 12.8 oz (64.3 kg)    Constitutional:      Appearance: Healthy appearance. Not in distress.  Neck:     Vascular: No carotid bruit or JVR. JVD normal.  Pulmonary:     Effort: Pulmonary effort is normal.     Breath sounds: No wheezing. No rales.  Cardiovascular:     Normal rate. Irregularly irregular rhythm. Normal S1. Normal S2.      Murmurs: There is no murmur.  Edema:    Peripheral edema present.    Pretibial: bilateral 1+ edema of the pretibial area.    Ankle: bilateral 2+ edema of the ankle. Abdominal:     Palpations: Abdomen is soft.  Skin:    General: Skin is warm and dry.  Neurological:     Mental Status: Alert and oriented to person, place and time.         ASSESSMENT & PLAN:   Permanent atrial fibrillation (HCC) Rate is controlled on current therapy with diltiazem and metoprolol succinate.  She is followed in our Coumadin clinic.  She is tolerating anticoagulation well.  Hemoglobin was normal in October 2022.  She has follow-up with primary care soon for her annual visit.  Labs will be drawn at that time.  Follow-up in 1 year.  Essential hypertension The patient's blood pressure is controlled on her current regimen.  Continue current therapy.   Aortic atherosclerosis ([place]) She is not on antiplatelet therapy as she is on warfarin.  Continue simvastatin.  Edema of both lower extremities due to peripheral venous insufficiency We discussed the importance of elevation and compression  stockings.  She has had a history of falls and I would prefer to avoid diuretic therapy which would increase her risk of orthostasis and falls.           Dispo:  Return in about 1 year (around 03/13/2023) for Routine Follow Up, w/ Richardson Dopp, PA-C.   Medication Adjustments/Labs and Tests Ordered: Current medicines are reviewed at length with the patient today.  Concerns regarding medicines are outlined above.  Tests Ordered: Orders  Placed This Encounter  Procedures   EKG 12-Lead   Medication Changes: No orders of the defined types were placed in this encounter.  Signed, Richardson Dopp, PA-C  03/12/2022 10:07 AM    [facility] [place], [place], [place]  [address] Phone: [phone]; Fax: [phone]

## 2022-03-12 ENCOUNTER — Ambulatory Visit (INDEPENDENT_AMBULATORY_CARE_PROVIDER_SITE_OTHER): Payer: Medicare Other | Admitting: Physician Assistant

## 2022-03-12 ENCOUNTER — Other Ambulatory Visit (HOSPITAL_COMMUNITY): Payer: Self-pay

## 2022-03-12 ENCOUNTER — Encounter: Payer: Self-pay | Admitting: Physician Assistant

## 2022-03-12 ENCOUNTER — Ambulatory Visit (INDEPENDENT_AMBULATORY_CARE_PROVIDER_SITE_OTHER): Payer: Medicare Other

## 2022-03-12 VITALS — BP 128/60 | HR 65 | Ht 64.0 in | Wt 140.2 lb

## 2022-03-12 DIAGNOSIS — Z7901 Long term (current) use of anticoagulants: Secondary | ICD-10-CM | POA: Diagnosis not present

## 2022-03-12 DIAGNOSIS — I872 Venous insufficiency (chronic) (peripheral): Secondary | ICD-10-CM | POA: Diagnosis not present

## 2022-03-12 DIAGNOSIS — I10 Essential (primary) hypertension: Secondary | ICD-10-CM | POA: Diagnosis not present

## 2022-03-12 DIAGNOSIS — I4821 Permanent atrial fibrillation: Secondary | ICD-10-CM

## 2022-03-12 DIAGNOSIS — K55069 Acute infarction of intestine, part and extent unspecified: Secondary | ICD-10-CM | POA: Diagnosis not present

## 2022-03-12 DIAGNOSIS — I4891 Unspecified atrial fibrillation: Secondary | ICD-10-CM

## 2022-03-12 DIAGNOSIS — I700 Atherosclerosis of aorta: Secondary | ICD-10-CM | POA: Diagnosis not present

## 2022-03-12 DIAGNOSIS — Z5181 Encounter for therapeutic drug level monitoring: Secondary | ICD-10-CM

## 2022-03-12 LAB — POCT INR: INR: 2.1 (ref 2.0–3.0)

## 2022-03-12 NOTE — Patient Instructions (Signed)
Medication Instructions:  Your physician recommends that you continue on your current medications as directed. Please refer to the Current Medication list given to you today.  *If you need a refill on your cardiac medications before your next appointment, please call your pharmacy*   Lab Work: None ordered  If you have labs (blood work) drawn today and your tests are completely normal, you will receive your results only by: [place] (if you have MyChart) OR A paper copy in the mail If you have any lab test that is abnormal or we need to change your treatment, we will call you to review the results.   Testing/Procedures: None ordered   Follow-Up: At [facility], you and your health needs are our priority.  As part of our continuing mission to provide you with exceptional heart care, we have created designated Provider Care Teams.  These Care Teams include your primary Cardiologist (physician) and Advanced Practice Providers (APPs -  Physician Assistants and Nurse Practitioners) who all work together to provide you with the care you need, when you need it.  We recommend signing up for the patient portal called "MyChart".  Sign up information is provided on this After Visit Summary.  MyChart is used to connect with patients for Virtual Visits (Telemedicine).  Patients are able to view lab/test results, encounter notes, upcoming appointments, etc.  Non-urgent messages can be sent to your provider as well.   To learn more about what you can do with MyChart, go to [email or web address].    Your next appointment:   1 year(s)  The format for your next appointment:   In Person  Provider:   Richardson Dopp, PA-C         Other Instructions   Important Information About Sugar

## 2022-03-12 NOTE — Patient Instructions (Signed)
Continue 1/2 tablet everyday, except 1 tablet Wednesday.  Recheck INR in 4 weeks. Coumadin Clinic # [phone].

## 2022-03-12 NOTE — Assessment & Plan Note (Signed)
The patient's blood pressure is controlled on her current regimen.  Continue current therapy.   

## 2022-03-12 NOTE — Assessment & Plan Note (Signed)
She is not on antiplatelet therapy as she is on warfarin.  Continue simvastatin.

## 2022-03-12 NOTE — Assessment & Plan Note (Signed)
We discussed the importance of elevation and compression stockings.  She has had a history of falls and I would prefer to avoid diuretic therapy which would increase her risk of orthostasis and falls.

## 2022-03-12 NOTE — Assessment & Plan Note (Signed)
Rate is controlled on current therapy with diltiazem and metoprolol succinate.  She is followed in our Coumadin clinic.  She is tolerating anticoagulation well.  Hemoglobin was normal in October 2022.  She has follow-up with primary care soon for her annual visit.  Labs will be drawn at that time.  Follow-up in 1 year.

## 2022-03-15 ENCOUNTER — Other Ambulatory Visit (HOSPITAL_COMMUNITY): Payer: Self-pay

## 2022-03-18 ENCOUNTER — Other Ambulatory Visit: Payer: Self-pay

## 2022-03-18 ENCOUNTER — Other Ambulatory Visit (HOSPITAL_COMMUNITY): Payer: Self-pay

## 2022-03-18 DIAGNOSIS — I4821 Permanent atrial fibrillation: Secondary | ICD-10-CM

## 2022-03-18 MED ORDER — WARFARIN SODIUM 3 MG PO TABS
ORAL_TABLET | ORAL | 0 refills | Status: DC
  Filled 2022-03-18: qty 50, 50d supply, fill #0

## 2022-03-18 NOTE — Telephone Encounter (Signed)
Prescription refill request received for warfarin Lov: 03/12/22 Kathlen Mody)  Next INR check: 04/09/22 Warfarin tablet strength: '3mg'$   Appropriate dose and refill sent to requested pharmacy.

## 2022-03-19 ENCOUNTER — Other Ambulatory Visit (HOSPITAL_COMMUNITY): Payer: Self-pay

## 2022-03-22 ENCOUNTER — Other Ambulatory Visit (HOSPITAL_COMMUNITY): Payer: Self-pay

## 2022-03-24 ENCOUNTER — Other Ambulatory Visit (HOSPITAL_COMMUNITY): Payer: Self-pay

## 2022-03-25 ENCOUNTER — Other Ambulatory Visit (HOSPITAL_COMMUNITY): Payer: Self-pay

## 2022-04-09 ENCOUNTER — Ambulatory Visit (INDEPENDENT_AMBULATORY_CARE_PROVIDER_SITE_OTHER): Payer: Medicare Other | Admitting: *Deleted

## 2022-04-09 DIAGNOSIS — I4821 Permanent atrial fibrillation: Secondary | ICD-10-CM

## 2022-04-09 DIAGNOSIS — Z7901 Long term (current) use of anticoagulants: Secondary | ICD-10-CM

## 2022-04-09 DIAGNOSIS — I4891 Unspecified atrial fibrillation: Secondary | ICD-10-CM

## 2022-04-09 DIAGNOSIS — K55069 Acute infarction of intestine, part and extent unspecified: Secondary | ICD-10-CM | POA: Diagnosis not present

## 2022-04-09 LAB — POCT INR: INR: 1.7 — AB (ref 2.0–3.0)

## 2022-04-09 NOTE — Patient Instructions (Signed)
Description   Today take 1 tablet then continue taking 1/2 tablet everyday, except 1 tablet Wednesday.  Recheck INR in 3 weeks. Coumadin Clinic # [phone].

## 2022-04-30 ENCOUNTER — Ambulatory Visit: Payer: Medicare Other | Attending: Cardiovascular Disease

## 2022-04-30 DIAGNOSIS — I4821 Permanent atrial fibrillation: Secondary | ICD-10-CM | POA: Diagnosis not present

## 2022-04-30 DIAGNOSIS — Z7901 Long term (current) use of anticoagulants: Secondary | ICD-10-CM

## 2022-04-30 DIAGNOSIS — I4891 Unspecified atrial fibrillation: Secondary | ICD-10-CM

## 2022-04-30 DIAGNOSIS — Z5181 Encounter for therapeutic drug level monitoring: Secondary | ICD-10-CM

## 2022-04-30 DIAGNOSIS — K55069 Acute infarction of intestine, part and extent unspecified: Secondary | ICD-10-CM

## 2022-04-30 LAB — POCT INR: INR: 2 (ref 2.0–3.0)

## 2022-04-30 NOTE — Patient Instructions (Signed)
continue taking 1/2 tablet everyday, except 1 tablet Wednesday.  Recheck INR in 4 weeks. Coumadin Clinic # [phone].

## 2022-05-17 ENCOUNTER — Other Ambulatory Visit: Payer: Self-pay | Admitting: Physician Assistant

## 2022-05-17 ENCOUNTER — Other Ambulatory Visit (HOSPITAL_COMMUNITY): Payer: Self-pay

## 2022-05-17 ENCOUNTER — Encounter: Payer: Self-pay | Admitting: *Deleted

## 2022-05-17 MED ORDER — DILTIAZEM HCL ER COATED BEADS 180 MG PO CP24
180.0000 mg | ORAL_CAPSULE | Freq: Every day | ORAL | 3 refills | Status: DC
  Filled 2022-05-17: qty 90, 90d supply, fill #0
  Filled 2022-08-16 – 2022-08-17 (×2): qty 90, 90d supply, fill #1
  Filled 2022-11-12: qty 90, 90d supply, fill #2
  Filled 2023-02-14: qty 90, 90d supply, fill #3

## 2022-05-17 MED ORDER — METOPROLOL SUCCINATE ER 25 MG PO TB24
25.0000 mg | ORAL_TABLET | Freq: Every day | ORAL | 3 refills | Status: DC
  Filled 2022-05-17: qty 90, 90d supply, fill #0
  Filled 2022-08-16 – 2022-08-17 (×2): qty 90, 90d supply, fill #1
  Filled 2022-11-12: qty 90, 90d supply, fill #2
  Filled 2023-02-14: qty 90, 90d supply, fill #3

## 2022-05-18 ENCOUNTER — Other Ambulatory Visit (HOSPITAL_COMMUNITY): Payer: Self-pay

## 2022-05-28 ENCOUNTER — Ambulatory Visit: Payer: Medicare Other | Attending: Cardiovascular Disease

## 2022-05-28 DIAGNOSIS — K55069 Acute infarction of intestine, part and extent unspecified: Secondary | ICD-10-CM

## 2022-05-28 DIAGNOSIS — Z7901 Long term (current) use of anticoagulants: Secondary | ICD-10-CM | POA: Diagnosis not present

## 2022-05-28 DIAGNOSIS — I4891 Unspecified atrial fibrillation: Secondary | ICD-10-CM

## 2022-05-28 DIAGNOSIS — I4821 Permanent atrial fibrillation: Secondary | ICD-10-CM

## 2022-05-28 LAB — POCT INR: INR: 2.3 (ref 2.0–3.0)

## 2022-05-28 NOTE — Patient Instructions (Signed)
Description   continue taking 1/2 tablet everyday, except 1 tablet Wednesday. Recheck INR in 5 weeks. Coumadin Clinic # [phone].

## 2022-06-09 ENCOUNTER — Other Ambulatory Visit (HOSPITAL_COMMUNITY): Payer: Self-pay

## 2022-06-09 ENCOUNTER — Other Ambulatory Visit: Payer: Self-pay | Admitting: Internal Medicine

## 2022-06-09 DIAGNOSIS — I4821 Permanent atrial fibrillation: Secondary | ICD-10-CM

## 2022-06-09 MED ORDER — WARFARIN SODIUM 3 MG PO TABS
ORAL_TABLET | ORAL | 1 refills | Status: DC
  Filled 2022-06-09: qty 60, 90d supply, fill #0

## 2022-06-09 NOTE — Telephone Encounter (Signed)
Last INR 05/28/2022 Last OV 03/12/2022

## 2022-06-10 ENCOUNTER — Encounter: Payer: Self-pay | Admitting: Family Medicine

## 2022-06-10 ENCOUNTER — Ambulatory Visit (INDEPENDENT_AMBULATORY_CARE_PROVIDER_SITE_OTHER): Payer: Medicare Other | Admitting: Family Medicine

## 2022-06-10 VITALS — BP 118/60 | HR 72 | Temp 97.9°F | Ht 64.0 in | Wt 139.0 lb

## 2022-06-10 DIAGNOSIS — Z23 Encounter for immunization: Secondary | ICD-10-CM

## 2022-06-10 DIAGNOSIS — E785 Hyperlipidemia, unspecified: Secondary | ICD-10-CM

## 2022-06-10 DIAGNOSIS — I4821 Permanent atrial fibrillation: Secondary | ICD-10-CM

## 2022-06-10 DIAGNOSIS — I10 Essential (primary) hypertension: Secondary | ICD-10-CM | POA: Diagnosis not present

## 2022-06-10 DIAGNOSIS — E559 Vitamin D deficiency, unspecified: Secondary | ICD-10-CM

## 2022-06-10 LAB — COMPREHENSIVE METABOLIC PANEL
ALT: 16 U/L (ref 0–35)
AST: 24 U/L (ref 0–37)
Albumin: 4.1 g/dL (ref 3.5–5.2)
Alkaline Phosphatase: 52 U/L (ref 39–117)
BUN: 17 mg/dL (ref 6–23)
CO2: 32 mEq/L (ref 19–32)
Calcium: 9.3 mg/dL (ref 8.4–10.5)
Chloride: 102 mEq/L (ref 96–112)
Creatinine, Ser: 0.67 mg/dL (ref 0.40–1.20)
GFR: 75.96 mL/min (ref >60.00)
Glucose, Bld: 87 mg/dL (ref 70–99)
Potassium: 4.3 mEq/L (ref 3.5–5.1)
Sodium: 140 mEq/L (ref 135–145)
Total Bilirubin: 0.5 mg/dL (ref 0.2–1.2)
Total Protein: 6.5 g/dL (ref 6.0–8.3)

## 2022-06-10 LAB — LIPID PANEL
Cholesterol: 136 mg/dL (ref 0–200)
HDL: 58.2 mg/dL (ref >39.00)
LDL Cholesterol: 57 mg/dL (ref 0–99)
NonHDL: 78.07
Total CHOL/HDL Ratio: 2
Triglycerides: 106 mg/dL (ref 0.0–149.0)
VLDL: 21.2 mg/dL (ref 0.0–40.0)

## 2022-06-10 LAB — CBC WITH DIFFERENTIAL/PLATELET
Basophils Absolute: 0 10*3/uL (ref 0.0–0.1)
Basophils Relative: 0.5 % (ref 0.0–3.0)
Eosinophils Absolute: 0.1 10*3/uL (ref 0.0–0.7)
Eosinophils Relative: 1.4 % (ref 0.0–5.0)
HCT: 40.9 % (ref 36.0–46.0)
Hemoglobin: 13.6 g/dL (ref 12.0–15.0)
Lymphocytes Relative: 26.5 % (ref 12.0–46.0)
Lymphs Abs: 1.7 10*3/uL (ref 0.7–4.0)
MCHC: 33.1 g/dL (ref 30.0–36.0)
MCV: 94 fl (ref 78.0–100.0)
Monocytes Absolute: 0.7 10*3/uL (ref 0.1–1.0)
Monocytes Relative: 11 % (ref 3.0–12.0)
Neutro Abs: 3.8 10*3/uL (ref 1.4–7.7)
Neutrophils Relative %: 60.6 % (ref 43.0–77.0)
Platelets: 194 10*3/uL (ref 150.0–400.0)
RBC: 4.36 Mil/uL (ref 3.87–5.11)
RDW: 14.3 % (ref 11.5–15.5)
WBC: 6.3 10*3/uL (ref 4.0–10.5)

## 2022-06-10 LAB — VITAMIN D 25 HYDROXY (VIT D DEFICIENCY, FRACTURES): VITD: 45.93 ng/mL (ref 30.00–100.00)

## 2022-06-10 NOTE — Progress Notes (Signed)
Phone [phone] In person visit   Subjective:   Lisa Douglas is a 88 y.o. 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    Priority: Medium    Anxiety state 10/03/2007    Priority: Medium    Advanced nonexudative age-related macular degeneration of both eyes without subfoveal involvement 10/03/2007    Priority: Medium    Osteoporosis 10/03/2007    Priority: Medium    Hyperlipidemia 10/02/2007    Priority: Medium    Angiodysplasia of colon 06/01/2004    Priority: Medium    Osteoarthritis of left knee 10/26/2016    Priority: Low   Epigastric hernia 12/23/2014    Priority: Low   Diverticulitis of colon 06/12/2014    Priority: Low   CKD (chronic kidney disease), stage II 03/26/2014    Priority: Low   Long term current use of anticoagulant therapy 03/20/2012    Priority: Low   Leg cramps 12/30/2011    Priority: Low   Vitamin D deficiency 04/01/2011    Priority: Low   Edema of both lower extremities due to peripheral venous insufficiency 10/03/2007    Priority: Low   GERD 10/03/2007    Priority: [place] DISEASE 10/03/2007    Priority: Low   Pseudophakia of both eyes 01/26/2018   Posterior vitreous detachment, bilateral 05/25/2016   Nuclear cataract of both eyes 11/08/2014   Macular hole 08/31/2011    Medications- reviewed and updated Current Outpatient Medications  Medication Sig Dispense Refill   Cholecalciferol (VITAMIN D) 2000 UNITS CAPS Take 2,000 Units by mouth daily.     diltiazem (CARDIZEM  CD) 180 MG 24 hr capsule Take 1 capsule (180 mg total) by mouth daily. 90 capsule 3   metoprolol succinate (TOPROL-XL) 25 MG 24 hr tablet Take 1 tablet (25 mg total) by mouth daily. 90 tablet 3   Multiple Vitamin (MULTIVITAMIN WITH MINERALS) TABS Take 1 tablet by mouth daily.     Multiple Vitamins-Minerals (PRESERVISION AREDS) CAPS Take 1 capsule by mouth in the morning and at bedtime.     simvastatin (ZOCOR) 20 MG tablet Take 1 tablet (20 mg total) by mouth at bedtime. 90 tablet 3   warfarin (COUMADIN) 3 MG tablet TAKE 1/2 TO 1 TABLET BY MOUTH AS DIRECTED ONCE DAILY BY THE COUMADIN  CLINIC 60 tablet 1   No current facility-administered medications for this visit.     Objective:  BP 118/60   Pulse 72   Temp 97.9 F (36.6 C)   Ht '5\' 4"'$  (1.626 m)   Wt 139 lb (63 kg)   SpO2 95%   BMI 23.86 kg/m  Gen: NAD, resting comfortably CV: RRR no murmurs rubs or gallops Lungs: CTAB no crackles, wheeze, rhonchi Abdomen: soft/nontender/nondistended/normal bowel sounds.  Ext: trace edema R>L Skin: warm, dry     Assessment and Plan   # Atrial fibrillation/also with history superior mesenteric artery thrombosis- secondary indication for long term coumadin (through [facility] card clinic) S:rate controlled on metoprolol 25 mg XR and diltiazem '180mg'$  XR. Anticoagulated on  coumadin through cardiology   A/P: appropriately anticoagulated and rate controlled- continue current medicine   # Hypertension S:compliant with metoprolol 25 mg XR, diltiazem '180mg'$  XR  BP Readings from Last 3 Encounters:  06/10/22 118/60  03/12/22 128/60  12/09/21 126/60  A/P: Controlled. Continue current medications.   # Hyperlipidemia/aortic atherosclerosis S:compliant with simvastatin '20mg'$ .  Aortic atherosclerosis noted 01/28/15 on CT scan Lab Results  Component Value Date   CHOL 114 06/02/2021   HDL 56.40 06/02/2021   LDLCALC 34 06/02/2021   TRIG 122.0 06/02/2021   CHOLHDL 2 06/02/2021  A/P: hopefully stable- update Lipid  panel today. Continue current meds for now- discussed option of decreasing- she prefers same dose   # Osteoporosis/vitamin D deficiency S:  Patient did not tolerate fosamax. Does not want injectables like prolia or reclast- confirms today. . She is on vitamin D (she opted not to take calcium)- on 2000  -thankfully no falls Last vitamin D Lab Results  Component Value Date   VD25OH 57.20 06/02/2021  A/P:vitamin d deficiency- hopefully stable- update vitamin D today. Continue current meds for now  Osteoporosis- declines bone density again today as well as medications   # GERD S: very sparing use of nexium or prilosec '20mg'$ - effective when she uses.  Not needing much lately A/P: overall doing well -continue to monitor   # anxiety- sparing use of xanax in past- stopped on med list around 2020-  -using walker to avoid falls- no falls since christmas 2022!  -offered sertraline or buspirone- declines for now  Recommended follow up: Return in about 6 months (around 12/10/2022) for followup or sooner if needed.Schedule b4 you leave. Future Appointments  Date Time Provider [place]  07/02/2022  1:00 PM [facility] None   Lab/Order associations:   ICD-10-CM   1. Permanent atrial fibrillation (HCC)  I48.21     2. Essential hypertension  I10     3. Hyperlipidemia, unspecified hyperlipidemia type  E78.5     4. Need for immunization against influenza  Z23 Flu Vaccine QUAD High Dose(Fluad)    5. Vitamin D deficiency  E55.9       No orders of the defined types were placed in this encounter.   Return precautions advised.  Garret Reddish, MD

## 2022-06-10 NOTE — Patient Instructions (Addendum)
Please stop by lab before you go If you have mychart- we will send your results within 3 business days of [place] receiving them.  If you do not have mychart- we will call you about results within 5 business days of [place] receiving them.  *please also note that you will see labs on mychart as soon as they post. I will later go in and write notes on them- will say "notes from Dr. Yong Channel"   Recommended follow up: Return in about 6 months (around 12/10/2022) for followup or sooner if needed.Schedule b4 you leave.

## 2022-06-11 ENCOUNTER — Other Ambulatory Visit (HOSPITAL_COMMUNITY): Payer: Self-pay

## 2022-07-02 ENCOUNTER — Ambulatory Visit: Payer: Medicare Other | Attending: Cardiovascular Disease

## 2022-07-02 DIAGNOSIS — K55069 Acute infarction of intestine, part and extent unspecified: Secondary | ICD-10-CM

## 2022-07-02 DIAGNOSIS — I4891 Unspecified atrial fibrillation: Secondary | ICD-10-CM

## 2022-07-02 DIAGNOSIS — Z7901 Long term (current) use of anticoagulants: Secondary | ICD-10-CM

## 2022-07-02 LAB — POCT INR: INR: 2.8 (ref 2.0–3.0)

## 2022-07-02 NOTE — Patient Instructions (Signed)
Continue taking 1/2 tablet everyday, except 1 tablet Wednesday. Recheck INR in 5 weeks. Coumadin Clinic # [phone].  Eat greens tonight.

## 2022-07-12 DIAGNOSIS — M25562 Pain in left knee: Secondary | ICD-10-CM | POA: Diagnosis not present

## 2022-08-06 ENCOUNTER — Ambulatory Visit: Payer: Medicare Other | Attending: Cardiovascular Disease

## 2022-08-06 DIAGNOSIS — K55069 Acute infarction of intestine, part and extent unspecified: Secondary | ICD-10-CM

## 2022-08-06 DIAGNOSIS — I4891 Unspecified atrial fibrillation: Secondary | ICD-10-CM

## 2022-08-06 DIAGNOSIS — Z5181 Encounter for therapeutic drug level monitoring: Secondary | ICD-10-CM | POA: Diagnosis not present

## 2022-08-06 DIAGNOSIS — I4821 Permanent atrial fibrillation: Secondary | ICD-10-CM

## 2022-08-06 DIAGNOSIS — Z7901 Long term (current) use of anticoagulants: Secondary | ICD-10-CM

## 2022-08-06 LAB — POCT INR: INR: 3.2 — AB (ref 2.0–3.0)

## 2022-08-06 NOTE — Patient Instructions (Signed)
HOLD TONIGHT ONLY THEN Continue taking 1/2 tablet everyday, except 1 tablet Wednesday. Recheck INR in 3 weeks. Coumadin Clinic # [phone].

## 2022-08-17 ENCOUNTER — Other Ambulatory Visit (HOSPITAL_COMMUNITY): Payer: Self-pay

## 2022-08-17 ENCOUNTER — Other Ambulatory Visit: Payer: Self-pay

## 2022-08-27 ENCOUNTER — Ambulatory Visit: Payer: Medicare Other | Attending: Cardiology

## 2022-08-27 DIAGNOSIS — I4891 Unspecified atrial fibrillation: Secondary | ICD-10-CM | POA: Diagnosis not present

## 2022-08-27 DIAGNOSIS — I4821 Permanent atrial fibrillation: Secondary | ICD-10-CM

## 2022-08-27 DIAGNOSIS — Z5181 Encounter for therapeutic drug level monitoring: Secondary | ICD-10-CM | POA: Diagnosis not present

## 2022-08-27 DIAGNOSIS — K55069 Acute infarction of intestine, part and extent unspecified: Secondary | ICD-10-CM

## 2022-08-27 DIAGNOSIS — Z7901 Long term (current) use of anticoagulants: Secondary | ICD-10-CM | POA: Diagnosis not present

## 2022-08-27 LAB — POCT INR: INR: 2.4 (ref 2.0–3.0)

## 2022-08-27 NOTE — Patient Instructions (Signed)
Continue taking 1/2 tablet everyday, except 1 tablet Wednesday. Recheck INR in 4 weeks. Coumadin Clinic # [phone].

## 2022-09-06 ENCOUNTER — Other Ambulatory Visit (HOSPITAL_COMMUNITY): Payer: Self-pay

## 2022-09-24 ENCOUNTER — Other Ambulatory Visit (HOSPITAL_COMMUNITY): Payer: Self-pay

## 2022-09-24 ENCOUNTER — Ambulatory Visit: Payer: Medicare Other | Attending: Cardiovascular Disease | Admitting: *Deleted

## 2022-09-24 DIAGNOSIS — Z7901 Long term (current) use of anticoagulants: Secondary | ICD-10-CM | POA: Insufficient documentation

## 2022-09-24 DIAGNOSIS — K55069 Acute infarction of intestine, part and extent unspecified: Secondary | ICD-10-CM | POA: Insufficient documentation

## 2022-09-24 DIAGNOSIS — I4821 Permanent atrial fibrillation: Secondary | ICD-10-CM | POA: Insufficient documentation

## 2022-09-24 LAB — POCT INR: INR: 1.9 — AB (ref 2.0–3.0)

## 2022-09-24 MED ORDER — WARFARIN SODIUM 3 MG PO TABS
ORAL_TABLET | ORAL | 1 refills | Status: DC
  Filled 2022-09-24: qty 60, 90d supply, fill #0
  Filled 2023-01-11: qty 60, 90d supply, fill #1

## 2022-09-24 NOTE — Patient Instructions (Signed)
Description   Today take 1 tablet of warfarin then continue taking 1/2 tablet everyday, except 1 tablet Wednesday. Recheck INR in 4 weeks. Coumadin Clinic # [phone].

## 2022-10-14 ENCOUNTER — Telehealth: Payer: Self-pay

## 2022-10-14 NOTE — Telephone Encounter (Signed)
Called patient to schedule Medicare Annual Wellness Visit (AWV). No voicemail available to leave a message.  Last date of AWV: 09/28/22  Please schedule an appointment at any time with [place].  Norton Blizzard, [place] (AAMA)  [place] [facility] [phone]

## 2022-10-22 ENCOUNTER — Ambulatory Visit: Payer: Medicare Other | Attending: Physician Assistant | Admitting: *Deleted

## 2022-10-22 DIAGNOSIS — I4821 Permanent atrial fibrillation: Secondary | ICD-10-CM

## 2022-10-22 DIAGNOSIS — K55069 Acute infarction of intestine, part and extent unspecified: Secondary | ICD-10-CM | POA: Diagnosis not present

## 2022-10-22 DIAGNOSIS — Z7901 Long term (current) use of anticoagulants: Secondary | ICD-10-CM | POA: Diagnosis not present

## 2022-10-22 LAB — POCT INR: INR: 2.7 (ref 2.0–3.0)

## 2022-10-22 NOTE — Patient Instructions (Signed)
Description   Have some leafy veggies today then continue taking 1/2 tablet everyday, except 1 tablet Wednesday. Recheck INR in 4 weeks. Coumadin Clinic # [phone].

## 2022-11-15 ENCOUNTER — Telehealth: Payer: Self-pay | Admitting: Family Medicine

## 2022-11-15 NOTE — Telephone Encounter (Signed)
Contacted [name] to schedule their annual wellness visit. Appointment made for 11/16/2022.  [place] Direct Dial [phone]

## 2022-11-16 ENCOUNTER — Ambulatory Visit (INDEPENDENT_AMBULATORY_CARE_PROVIDER_SITE_OTHER): Payer: Medicare Other

## 2022-11-16 VITALS — Wt 139.0 lb

## 2022-11-16 DIAGNOSIS — Z0000 Encounter for general adult medical examination without abnormal findings: Secondary | ICD-10-CM

## 2022-11-16 NOTE — Patient Instructions (Signed)
[name] , Thank you for taking time to come for your Medicare Wellness Visit. I appreciate your ongoing commitment to your health goals. Please review the following plan we discussed and let me know if I can assist you in the future.   These are the goals we discussed:  Goals      patient     Plans to move to smaller area. Will start checking out the community to see where you desire to live      Patient Stated     Get better balance      Patient Stated     Keep standing up walking         This is a list of the screening recommended for you and due dates:  Health Maintenance  Topic Date Due   Zoster (Shingles) Vaccine (1 of 2) Never done   COVID-19 Vaccine (5 - 2023-24 season) 04/23/2022   Medicare Annual Wellness Visit  11/16/2023   DTaP/Tdap/Td vaccine (3 - Td or Tdap) 01/25/2031   Pneumonia Vaccine  Completed   Flu Shot  Completed   DEXA scan (bone density measurement)  Completed   HPV Vaccine  Aged Out    Advanced directives: Please bring a copy of your health care power of attorney and living will to the office at your convenience.  Conditions/risks identified: keep standing straight and walking   Next appointment: Follow up in one year for your annual wellness visit    Preventive Care 65 Years and Older, Female Preventive care refers to lifestyle choices and visits with your health care provider that can promote health and wellness. What does preventive care include? A yearly physical exam. This is also called an annual well check. Dental exams once or twice a year. Routine eye exams. Ask your health care provider how often you should have your eyes checked. Personal lifestyle choices, including: Daily care of your teeth and gums. Regular physical activity. Eating a healthy diet. Avoiding tobacco and drug use. Limiting alcohol use. Practicing safe sex. Taking low-dose aspirin every day. Taking vitamin and mineral supplements as recommended by your health  care provider. What happens during an annual well check? The services and screenings done by your health care provider during your annual well check will depend on your age, overall health, lifestyle risk factors, and family history of disease. Counseling  Your health care provider may ask you questions about your: Alcohol use. Tobacco use. Drug use. Emotional well-being. Home and relationship well-being. Sexual activity. Eating habits. History of falls. Memory and ability to understand (cognition). Work and work Statistician. Reproductive health. Screening  You may have the following tests or measurements: Height, weight, and BMI. Blood pressure. Lipid and cholesterol levels. These may be checked every 5 years, or more frequently if you are over 67 years old. Skin check. Lung cancer screening. You may have this screening every year starting at age 61 if you have a 30-pack-year history of smoking and currently smoke or have quit within the past 15 years. Fecal occult blood test (FOBT) of the stool. You may have this test every year starting at age 62. Flexible sigmoidoscopy or colonoscopy. You may have a sigmoidoscopy every 5 years or a colonoscopy every 10 years starting at age 19. Hepatitis C blood test. Hepatitis B blood test. Sexually transmitted disease (STD) testing. Diabetes screening. This is done by checking your blood sugar (glucose) after you have not eaten for a while (fasting). You may have this done every 1-3  years. Bone density scan. This is done to screen for osteoporosis. You may have this done starting at age 80. Mammogram. This may be done every 1-2 years. Talk to your health care provider about how often you should have regular mammograms. Talk with your health care provider about your test results, treatment options, and if necessary, the need for more tests. Vaccines  Your health care provider may recommend certain vaccines, such as: Influenza vaccine. This is  recommended every year. Tetanus, diphtheria, and acellular pertussis (Tdap, Td) vaccine. You may need a Td booster every 10 years. Zoster vaccine. You may need this after age 76. Pneumococcal 13-valent conjugate (PCV13) vaccine. One dose is recommended after age 66. Pneumococcal polysaccharide (PPSV23) vaccine. One dose is recommended after age 81. Talk to your health care provider about which screenings and vaccines you need and how often you need them. This information is not intended to replace advice given to you by your health care provider. Make sure you discuss any questions you have with your health care provider. Document Released: 09/05/2015 Document Revised: 04/28/2016 Document Reviewed: 06/10/2015 Elsevier Interactive Patient Education  2017 [place] Prevention in the Home Falls can cause injuries. They can happen to people of all ages. There are many things you can do to make your home safe and to help prevent falls. What can I do on the outside of my home? Regularly fix the edges of walkways and driveways and fix any cracks. Remove anything that might make you trip as you walk through a door, such as a raised step or threshold. Trim any bushes or trees on the path to your home. Use bright outdoor lighting. Clear any walking paths of anything that might make someone trip, such as rocks or tools. Regularly check to see if handrails are loose or broken. Make sure that both sides of any steps have handrails. Any raised decks and porches should have guardrails on the edges. Have any leaves, snow, or ice cleared regularly. Use sand or salt on walking paths during winter. Clean up any spills in your garage right away. This includes oil or grease spills. What can I do in the bathroom? Use night lights. Install grab bars by the toilet and in the tub and shower. Do not use towel bars as grab bars. Use non-skid mats or decals in the tub or shower. If you need to sit down in  the shower, use a plastic, non-slip stool. Keep the floor dry. Clean up any water that spills on the floor as soon as it happens. Remove soap buildup in the tub or shower regularly. Attach bath mats securely with double-sided non-slip rug tape. Do not have throw rugs and other things on the floor that can make you trip. What can I do in the bedroom? Use night lights. Make sure that you have a light by your bed that is easy to reach. Do not use any sheets or blankets that are too big for your bed. They should not hang down onto the floor. Have a firm chair that has side arms. You can use this for support while you get dressed. Do not have throw rugs and other things on the floor that can make you trip. What can I do in the kitchen? Clean up any spills right away. Avoid walking on wet floors. Keep items that you use a lot in easy-to-reach places. If you need to reach something above you, use a strong step stool that has a grab  bar. Keep electrical cords out of the way. Do not use floor polish or wax that makes floors slippery. If you must use wax, use non-skid floor wax. Do not have throw rugs and other things on the floor that can make you trip. What can I do with my stairs? Do not leave any items on the stairs. Make sure that there are handrails on both sides of the stairs and use them. Fix handrails that are broken or loose. Make sure that handrails are as long as the stairways. Check any carpeting to make sure that it is firmly attached to the stairs. Fix any carpet that is loose or worn. Avoid having throw rugs at the top or bottom of the stairs. If you do have throw rugs, attach them to the floor with carpet tape. Make sure that you have a light switch at the top of the stairs and the bottom of the stairs. If you do not have them, ask someone to add them for you. What else can I do to help prevent falls? Wear shoes that: Do not have high heels. Have rubber bottoms. Are comfortable  and fit you well. Are closed at the toe. Do not wear sandals. If you use a stepladder: Make sure that it is fully opened. Do not climb a closed stepladder. Make sure that both sides of the stepladder are locked into place. Ask someone to hold it for you, if possible. Clearly mark and make sure that you can see: Any grab bars or handrails. First and last steps. Where the edge of each step is. Use tools that help you move around (mobility aids) if they are needed. These include: Canes. Walkers. Scooters. Crutches. Turn on the lights when you go into a dark area. Replace any light bulbs as soon as they burn out. Set up your furniture so you have a clear path. Avoid moving your furniture around. If any of your floors are uneven, fix them. If there are any pets around you, be aware of where they are. Review your medicines with your doctor. Some medicines can make you feel dizzy. This can increase your chance of falling. Ask your doctor what other things that you can do to help prevent falls. This information is not intended to replace advice given to you by your health care provider. Make sure you discuss any questions you have with your health care provider. Document Released: 06/05/2009 Document Revised: 01/15/2016 Document Reviewed: 09/13/2014 Elsevier Interactive Patient Education  2017 [employer].

## 2022-11-16 NOTE — Progress Notes (Signed)
I connected with  Lisa Douglas on 11/16/22 by a audio enabled telemedicine application and verified that I am speaking with the correct person using two identifiers.  Patient Location: Home  Provider Location: Office/Clinic  I discussed the limitations of evaluation and management by telemedicine. The patient expressed understanding and agreed to proceed.   Subjective:   Lisa Douglas is a 89 y.o. female who presents for Medicare Annual (Subsequent) preventive examination.  Review of Systems     Cardiac Risk Factors include: advanced age (>83men, >66 women);dyslipidemia;hypertension;sedentary lifestyle     Objective:    Today's Vitals   11/16/22 1602  Weight: 139 lb (63 kg)   Body mass index is 23.86 kg/m.     11/16/2022    4:09 PM 09/28/2021    2:43 PM 05/08/2019    9:02 AM 05/02/2018    9:57 AM 04/28/2017    9:27 AM 06/30/2016   12:11 PM 06/22/2015    7:22 PM  Advanced Directives  Does Patient Have a Medical Advance Directive? Yes No Yes Yes Yes Yes Yes  Type of Paramedic of [place];Living will  Living will;Healthcare Power of Attorney Living will;Healthcare Power of [place];Living will [place];Living will  Does patient want to make changes to medical advance directive?  No - Patient declined No - Patient declined No - Patient declined  No - Patient declined No - Patient declined  Copy of [place] in Chart? No - copy requested  No - copy requested No - copy requested  No - copy requested No - copy requested  Would patient like information on creating a medical advance directive?      Yes - Educational materials given     Current Medications (verified) Outpatient Encounter Medications as of 11/16/2022  Medication Sig   Cholecalciferol (VITAMIN D) 2000 UNITS CAPS Take 2,000 Units by mouth daily.   diltiazem (CARDIZEM CD) 180 MG 24 hr capsule Take 1 capsule (180 mg total) by mouth  daily.   metoprolol succinate (TOPROL-XL) 25 MG 24 hr tablet Take 1 tablet (25 mg total) by mouth daily.   Multiple Vitamin (MULTIVITAMIN WITH MINERALS) TABS Take 1 tablet by mouth daily.   Multiple Vitamins-Minerals (PRESERVISION AREDS) CAPS Take 1 capsule by mouth in the morning and at bedtime.   simvastatin (ZOCOR) 20 MG tablet Take 1 tablet (20 mg total) by mouth at bedtime.   warfarin (COUMADIN) 3 MG tablet TAKE 1/2 TO 1 TABLET BY MOUTH AS DIRECTED ONCE DAILY BY THE COUMADIN  CLINIC   No facility-administered encounter medications on file as of 11/16/2022.    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 Not current problem.     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   TONSILLECTOMY  1943   Family History  Problem Relation Age of Onset   Hemochromatosis Father        tested in [place], denies history   Heart disease Sister    Breast cancer Sister        died 32, not sure  when she had it   Lymphoma Sister    Social History   Socioeconomic History   Marital status: Single    Spouse name: Not on file   Number of children: Not on file   Years of education: Not on file   Highest education level: Not on file  Occupational History   Occupation: retired  Tobacco Use   Smoking status: Former    Packs/day: 0.25    Years: 2.00    Additional pack years: 0.00    Total pack years: 0.50    Types: Cigarettes    Quit date: 08/24/1951    Years since quitting: 71.2   Smokeless tobacco: Never   Tobacco comments:    smoked at age 39 for a short time--a few months  Vaping Use   Vaping Use: Never used  Substance and Sexual Activity   Alcohol use: No   Drug use: No   Sexual activity: Not on file  Other Topics Concern   Not on file  Social History Narrative   Lives alone.    Previously lived with college age student that she helped raise [name], her niece's younger daughter, graduating may 2017)   Never married. Had 8 siblings total- lost last in 2021.       Born and raised in [place]. Lived on campus of [place].    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       Hobbies: used to sew and knit (vision issues now), used to bowl   Now enjoys computer time-keeps up with family, not a big tv time   Social Determinants of Health   Financial Resource Strain: Low Risk  (11/16/2022)   Overall Financial Resource Strain (CARDIA)    Difficulty of Paying Living Expenses: Not hard at all  Food Insecurity: No Food Insecurity (11/16/2022)   Hunger Vital Sign    Worried About Running Out of Food in the Last Year: Never true    Ideal in the Last Year: Never true  Transportation Needs: No Transportation Needs (11/16/2022)   PRAPARE - Hydrologist (Medical): No    Lack of  Transportation (Non-Medical): No  Physical Activity: Inactive (11/16/2022)   Exercise Vital Sign    Days of Exercise per Week: 0 days    Minutes of Exercise per Session: 0 min  Stress: No Stress Concern Present (11/16/2022)   [place]    Feeling of Stress : Only a little  Social Connections: Socially Isolated (11/16/2022)   Social Connection and Isolation Panel [NHANES]    Frequency of Communication with Friends and Family: More than three times a week    Frequency of Social Gatherings with Friends  and Family: Once a week    Attends Religious Services: Never    Active Member of Clubs or Organizations: No    Attends Music therapist: Never    Marital Status: Never married    Tobacco Counseling Counseling given: Not Answered Tobacco comments: smoked at age 29 for a short time--a few months   Clinical Intake:  Pre-visit preparation completed: Yes  Pain : No/denies pain     BMI - recorded: 23.86 Nutritional Status: BMI of 19-24  Normal Nutritional Risks: None Diabetes: No  How often do you need to have someone help you when you read instructions, pamphlets, or other written materials from your doctor or pharmacy?: 5 - Always (related to macular degeneration)  Diabetic?no  Interpreter Needed?: No  Information entered by :: Charlott Rakes, LPN   Activities of Daily Living    11/16/2022    4:09 PM  In your present state of health, do you have any difficulty performing the following activities:  Hearing? 1  Comment HOH  Vision? 0  Difficulty concentrating or making decisions? 0  Walking or climbing stairs? 0  Comment take time  Dressing or bathing? 0  Doing errands, shopping? 0  Preparing Food and eating ? N  Using the Toilet? N  In the past six months, have you accidently leaked urine? Y  Comment wears pads  Do you have problems with loss of bowel control? N  Managing your Medications?  N  Managing your Finances? N  Housekeeping or managing your Housekeeping? N    Patient Care Team: Marin Olp, MD as PCP - General (Family Medicine) Shon Hough, MD as Consulting Physician (Ophthalmology) Feliz Beam, MD as Referring Physician (Ophthalmology) Sharmon Revere as Physician Assistant (Cardiology)  Indicate any recent Medical Services you may have received from other than [facility] providers in the past year (date may be approximate).     Assessment:   This is a routine wellness examination for [facility].  Hearing/Vision screen Hearing Screening - Comments:: Pt stated HOH  Vision Screening - Comments:: Pt stated she use to follow up with Dr Brigitte Pulse   Dietary issues and exercise activities discussed: Current Exercise Habits: The patient does not participate in regular exercise at present   Goals Addressed             This Visit's Progress    Patient Stated       Keep standing up walking        Depression Screen    11/16/2022    4:07 PM 09/28/2021    2:39 PM 06/02/2021    9:42 AM 06/04/2020   12:59 PM 05/08/2019    9:02 AM 10/31/2018    9:00 AM 05/02/2018    9:58 AM  PHQ 2/9 Scores  PHQ - 2 Score 0 0 0 0 0 0 0  PHQ- 9 Score       0    Fall Risk    11/16/2022    4:09 PM 09/28/2021    2:44 PM 06/02/2021    9:42 AM 06/04/2020   12:59 PM 05/08/2019    9:02 AM  [place] in the past year? 0 1 1 0 0  Number falls in past yr: 0 1 1 0 0  Injury with Fall? 0 1 1 0 0  Comment  fell in the kitchen and had soreness     Risk for fall due to : Impaired mobility;Impaired balance/gait;Impaired vision Impaired vision;Impaired mobility;Impaired balance/gait  Impaired mobility;Impaired balance/gait    Follow up Falls prevention discussed Falls prevention discussed Falls evaluation completed;Education provided  Falls evaluation completed;Education provided    FALL RISK PREVENTION PERTAINING TO THE HOME:  Any stairs in or around the home? Yes  If so,  are there any without handrails? No  Home free of loose throw rugs in walkways, pet beds, electrical cords, etc? Yes  Adequate lighting in your home to reduce risk of falls? Yes   ASSISTIVE DEVICES UTILIZED TO PREVENT FALLS:  Life alert? Yes  Use of a cane, walker or w/c? Yes  Grab bars in the bathroom? No  Shower chair or bench in shower? Yes  Elevated toilet seat or a handicapped toilet? No   TIMED UP AND GO:  Was the test performed? No  Cognitive Function:        11/16/2022    4:10 PM 09/28/2021    2:47 PM 05/08/2019    9:03 AM 04/28/2017    9:37 AM  6CIT Screen  What Year? 0 points 0 points 0 points 0 points  What month? 0 points 0 points 0 points 0 points  What time? 0 points 0 points 0 points 0 points  Count back from 20 0 points 0 points 0 points 0 points  Months in reverse 0 points 0 points 0 points 0 points  Repeat phrase 0 points 0 points 0 points 0 points  Total Score 0 points 0 points 0 points 0 points    Immunizations Immunization History  Administered Date(s) Administered   Fluad Quad(high Dose 65+) 05/08/2019, 06/04/2020, 06/02/2021, 06/10/2022   Influenza Split 05/26/2011, 06/02/2012   Influenza Whole 05/15/2008, 05/21/2009, 05/27/2010   Influenza, High Dose Seasonal PF 04/27/2016, 04/28/2017, 05/02/2018   Influenza,inj,Quad PF,6+ Mos 05/09/2013, 05/23/2014, 04/29/2015   PFIZER Comirnaty(Gray Top)Covid-19 Tri-Sucrose Vaccine 07/24/2020   PFIZER(Purple Top)SARS-COV-2 Vaccination 09/21/2019, 10/12/2019   Pfizer Covid-19 Vaccine Bivalent Booster 34yrs & up 07/01/2021   Pneumococcal Conjugate-13 10/29/2014   Pneumococcal Polysaccharide-23 11/03/2012   Td 03/31/2010   Tdap 01/24/2021   Zoster, Live 09/24/2011    TDAP status: Up to date  Flu Vaccine status: Up to date  Pneumococcal vaccine status: Up to date  Covid-19 vaccine status: Completed vaccines  Qualifies for Shingles Vaccine? Yes   Zostavax completed No   Shingrix Completed?: No.    Education  has been provided regarding the importance of this vaccine. Patient has been advised to call insurance company to determine out of pocket expense if they have not yet received this vaccine. Advised may also receive vaccine at local pharmacy or Health Dept. Verbalized acceptance and understanding.  Screening Tests Health Maintenance  Topic Date Due   Zoster Vaccines- Shingrix (1 of 2) Never done   COVID-19 Vaccine (5 - 2023-24 season) 04/23/2022   Medicare Annual Wellness (AWV)  11/16/2023   DTaP/Tdap/Td (3 - Td or Tdap) 01/25/2031   Pneumonia Vaccine 38+ Years old  Completed   INFLUENZA VACCINE  Completed   DEXA SCAN  Completed   HPV VACCINES  Aged Out    Health Maintenance  Health Maintenance Due  Topic Date Due   Zoster Vaccines- Shingrix (1 of 2) Never done   COVID-19 Vaccine (5 - 2023-24 season) 04/23/2022    Colorectal cancer screening: No longer required.   Mammogram status: No longer required due to age .  Additional Screening:   Vision Screening: Recommended annual ophthalmology exams for early detection of glaucoma and other disorders of the eye. Is the patient up to  date with their annual eye exam?  No  Who is the provider or what is the name of the office in which the patient attends annual eye exams? Dr Brigitte Pulse If pt is not established with a provider, would they like to be referred to a provider to establish care? No .   Dental Screening: Recommended annual dental exams for proper oral hygiene  Community Resource Referral / Chronic Care Management: CRR required this visit?  No   CCM required this visit?  No      Plan:     I have personally reviewed and noted the following in the patient's chart:   Medical and social history Use of alcohol, tobacco or illicit drugs  Current medications and supplements including opioid prescriptions. Patient is not currently taking opioid prescriptions. Functional ability and status Nutritional status Physical  activity Advanced directives List of other physicians Hospitalizations, surgeries, and ER visits in previous 12 months Vitals Screenings to include cognitive, depression, and falls Referrals and appointments  In addition, I have reviewed and discussed with patient certain preventive protocols, quality metrics, and best practice recommendations. A written personalized care plan for preventive services as well as general preventive health recommendations were provided to patient.     Willette Brace, LPN   [phone]   Nurse Notes: none

## 2022-11-17 ENCOUNTER — Other Ambulatory Visit (HOSPITAL_COMMUNITY): Payer: Self-pay

## 2022-11-17 ENCOUNTER — Ambulatory Visit: Payer: Medicare Other | Attending: Internal Medicine

## 2022-11-17 DIAGNOSIS — K55069 Acute infarction of intestine, part and extent unspecified: Secondary | ICD-10-CM | POA: Diagnosis not present

## 2022-11-17 DIAGNOSIS — I4821 Permanent atrial fibrillation: Secondary | ICD-10-CM | POA: Diagnosis not present

## 2022-11-17 DIAGNOSIS — Z7901 Long term (current) use of anticoagulants: Secondary | ICD-10-CM | POA: Diagnosis not present

## 2022-11-17 LAB — POCT INR: INR: 1.9 — AB (ref 2.0–3.0)

## 2022-11-17 NOTE — Patient Instructions (Signed)
Description   Continue taking 1/2 tablet everyday, except 1 tablet Wednesdays. Recheck INR in 4 weeks. Coumadin Clinic # [phone].

## 2022-11-18 ENCOUNTER — Other Ambulatory Visit (HOSPITAL_COMMUNITY): Payer: Self-pay

## 2022-11-30 ENCOUNTER — Other Ambulatory Visit (HOSPITAL_COMMUNITY): Payer: Self-pay

## 2022-12-01 ENCOUNTER — Other Ambulatory Visit (HOSPITAL_COMMUNITY): Payer: Self-pay

## 2022-12-10 ENCOUNTER — Ambulatory Visit (INDEPENDENT_AMBULATORY_CARE_PROVIDER_SITE_OTHER): Payer: Medicare Other | Admitting: Family Medicine

## 2022-12-10 ENCOUNTER — Encounter: Payer: Self-pay | Admitting: Family Medicine

## 2022-12-10 VITALS — BP 132/72 | HR 64 | Temp 97.8°F | Ht 64.0 in | Wt 136.4 lb

## 2022-12-10 DIAGNOSIS — E785 Hyperlipidemia, unspecified: Secondary | ICD-10-CM

## 2022-12-10 DIAGNOSIS — I700 Atherosclerosis of aorta: Secondary | ICD-10-CM | POA: Diagnosis not present

## 2022-12-10 DIAGNOSIS — Z23 Encounter for immunization: Secondary | ICD-10-CM

## 2022-12-10 DIAGNOSIS — K55069 Acute infarction of intestine, part and extent unspecified: Secondary | ICD-10-CM

## 2022-12-10 DIAGNOSIS — I4821 Permanent atrial fibrillation: Secondary | ICD-10-CM

## 2022-12-10 DIAGNOSIS — I10 Essential (primary) hypertension: Secondary | ICD-10-CM

## 2022-12-10 NOTE — Patient Instructions (Addendum)
Prevnar 20 today  Please check with your pharmacy to see if they have the shingrix vaccine. If they do- please get this immunization and update [place] by phone call or mychart with dates you receive the vaccine  Please stop by lab before you go If you have mychart- we will send your results within 3 business days of [place] receiving them.  If you do not have mychart- we will call you about results within 5 business days of [place] receiving them.  *please also note that you will see labs on mychart as soon as they post. I will later go in and write notes on them- will say "notes from Dr. Durene Cal"   Recommended follow up: Return in about 5 months (around 05/12/2023) for followup or sooner if needed.Schedule b4 you leave.

## 2022-12-10 NOTE — Addendum Note (Signed)
Addended by: Donzetta Starch on: 12/10/2022 11:15 AM   Modules accepted: Orders

## 2022-12-10 NOTE — Progress Notes (Signed)
Phone [phone] In person visit   Subjective:   Lisa Douglas is a 89 y.o. year old very pleasant female patient who presents for/with See problem oriented charting Chief Complaint  Patient presents with   Atrial Fibrillation   6 month follow-up    Past Medical History-  Patient Active Problem List   Diagnosis Date Noted   Permanent atrial fibrillation 10/03/2007    Priority: High   Aortic atherosclerosis 10/31/2018    Priority: Medium    Essential hypertension 04/27/2016    Priority: Medium    Superior mesenteric artery thrombosis 03/03/2012    Priority: Medium    Anxiety state 10/03/2007    Priority: Medium    Advanced nonexudative age-related macular degeneration of both eyes without subfoveal involvement 10/03/2007    Priority: Medium    Osteoporosis 10/03/2007    Priority: Medium    Hyperlipidemia 10/02/2007    Priority: Medium    Angiodysplasia of colon 06/01/2004    Priority: Medium    Osteoarthritis of left knee 10/26/2016    Priority: Low   Epigastric hernia 12/23/2014    Priority: Low   Diverticulitis of colon 06/12/2014    Priority: Low   Long term current use of anticoagulant therapy 03/20/2012    Priority: Low   Leg cramps 12/30/2011    Priority: Low   Vitamin D deficiency 04/01/2011    Priority: Low   Edema of both lower 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     diltiazem (CARDIZEM CD) 180 MG 24 hr capsule Take 1 capsule (180 mg total) by mouth daily. 90 capsule 3    metoprolol succinate (TOPROL-XL) 25 MG 24 hr tablet Take 1 tablet (25 mg total) by mouth daily. 90 tablet 3   Multiple Vitamin (MULTIVITAMIN WITH MINERALS) TABS Take 1 tablet by mouth daily.     Multiple Vitamins-Minerals (PRESERVISION AREDS) CAPS Take 1 capsule by mouth in the morning and at bedtime.     simvastatin (ZOCOR) 20 MG tablet Take 1 tablet (20 mg total) by mouth at bedtime. 90 tablet 3   warfarin (COUMADIN) 3 MG tablet TAKE 1/2 TO 1 TABLET BY MOUTH AS DIRECTED ONCE DAILY BY THE COUMADIN  CLINIC 60 tablet 1   No current facility-administered medications for this visit.     Objective:  BP 132/72   Pulse 64   Temp 97.8 F (36.6 C) (Temporal)   Ht  (1.626 m)   Wt 136 lb 6.4 oz (61.9 kg)   SpO2 96%   BMI 23.41 kg/m  Gen: NAD, resting comfortably CV: RRR no murmurs rubs or gallops Lungs: CTAB no crackles, wheeze, rhonchi Ext: 1+ edema Skin: warm, dry No assistive device    Assessment and Plan   #social update - considering friends home - but not quite ready- encouraged her to consider  #Health maintenance - offered prevnar 20- opts in today  # Atrial fibrillation/also with history superior mesenteric artery thrombosis- secondary indication for long term coumadin (through [facility]) S:rate controlled on metoprolol  25 mg XR and diltiazem  XR. Anticoagulated on coumadin through cardiology   A/P: appropriately anticoagulated and rate controlled- continue current medicine History of superior mesenteric artery thrombosis- appropriately prevented from recurrence with coumadin   # Hypertension S:compliant with metoprolol 25 mg XR, diltiazem  XR  BP Readings from Last 3 Encounters:  12/10/22 132/72  06/10/22 118/60  03/12/22 128/60  A/P: reasonable control for age- continue current medications    # Hyperlipidemia/aortic atherosclerosis S:compliant with simvastatin .  Aortic atherosclerosis noted 01/28/15 on CT scan Lab Results  Component Value  Date   CHOL 136 06/10/2022   HDL 58.20 06/10/2022   LDLCALC 57 06/10/2022   TRIG 106.0 06/10/2022   CHOLHDL 2 06/10/2022  A/P: excellent control- continue current medications  Aortic atherosclerosis (presumed stable)- LDL goal ideally <70 - at goal   # Osteoporosis/vitamin D deficiency S:  Patient did not tolerate fosamax. Does not want injectables like prolia or reclast. She is on vitamin D (she opted not to take calcium). Declineds bone density retest as would not want medication. discussed importance of avoiding falls A/P: Osteoporosis likely worsening but she wants to hold off on repeat imaging as not interested in medicine- discussed focusing on fall prevention and thankuflly none since Christmas 2022  Recommended follow up: Return in about 5 months (around 05/12/2023) for followup or sooner if needed.Schedule b4 you leave. Future Appointments  Date Time Provider Department Center  12/16/2022  2:00 PM [facility] None  11/22/2023  3:15 PM [facility] ANNUAL WELLNESS VISIT 1 [facility]   Lab/Order associations:   ICD-10-CM   1. Permanent atrial fibrillation  I48.21     2. Essential hypertension  I10     3. Hyperlipidemia, unspecified hyperlipidemia type  E78.5     4. Aortic atherosclerosis  I70.0     5. Superior mesenteric artery thrombosis Chronic K55.069      No orders of the defined types were placed in this encounter.  Return precautions advised.  Tana Conch, MD

## 2022-12-16 ENCOUNTER — Ambulatory Visit: Payer: Medicare Other | Attending: Physician Assistant | Admitting: *Deleted

## 2022-12-16 DIAGNOSIS — Z7901 Long term (current) use of anticoagulants: Secondary | ICD-10-CM | POA: Insufficient documentation

## 2022-12-16 DIAGNOSIS — I4821 Permanent atrial fibrillation: Secondary | ICD-10-CM | POA: Diagnosis not present

## 2022-12-16 DIAGNOSIS — K55069 Acute infarction of intestine, part and extent unspecified: Secondary | ICD-10-CM | POA: Insufficient documentation

## 2022-12-16 LAB — POCT INR: INR: 2 (ref 2.0–3.0)

## 2022-12-16 NOTE — Patient Instructions (Signed)
Description   Continue taking warfarin 1/2 tablet everyday, except 1 tablet Wednesdays. Recheck INR in 5 weeks. [facility] # [phone].

## 2023-01-11 ENCOUNTER — Other Ambulatory Visit (HOSPITAL_COMMUNITY): Payer: Self-pay

## 2023-01-12 ENCOUNTER — Other Ambulatory Visit (HOSPITAL_COMMUNITY): Payer: Self-pay

## 2023-01-13 ENCOUNTER — Other Ambulatory Visit (HOSPITAL_COMMUNITY): Payer: Self-pay

## 2023-01-20 ENCOUNTER — Ambulatory Visit: Payer: Medicare Other | Attending: Cardiology

## 2023-01-20 DIAGNOSIS — I4821 Permanent atrial fibrillation: Secondary | ICD-10-CM

## 2023-01-20 DIAGNOSIS — K55069 Acute infarction of intestine, part and extent unspecified: Secondary | ICD-10-CM | POA: Diagnosis not present

## 2023-01-20 DIAGNOSIS — Z7901 Long term (current) use of anticoagulants: Secondary | ICD-10-CM

## 2023-01-20 LAB — POCT INR: INR: 2.3 (ref 2.0–3.0)

## 2023-01-20 NOTE — Patient Instructions (Signed)
Description   Continue taking warfarin 1/2 tablet everyday, except 1 tablet Wednesdays.  Recheck INR in 4 weeks. [facility] # [phone].

## 2023-02-14 ENCOUNTER — Other Ambulatory Visit (HOSPITAL_COMMUNITY): Payer: Self-pay

## 2023-02-17 ENCOUNTER — Ambulatory Visit: Payer: Medicare Other | Attending: Cardiology | Admitting: *Deleted

## 2023-02-17 DIAGNOSIS — K55069 Acute infarction of intestine, part and extent unspecified: Secondary | ICD-10-CM | POA: Diagnosis not present

## 2023-02-17 DIAGNOSIS — I4821 Permanent atrial fibrillation: Secondary | ICD-10-CM

## 2023-02-17 DIAGNOSIS — Z7901 Long term (current) use of anticoagulants: Secondary | ICD-10-CM | POA: Diagnosis not present

## 2023-02-17 LAB — POCT INR: INR: 2.3 (ref 2.0–3.0)

## 2023-02-17 NOTE — Patient Instructions (Signed)
Description   Continue taking warfarin 1/2 tablet everyday, except 1 tablet Wednesdays. Recheck INR in 5 weeks. [facility] # [phone].      

## 2023-03-03 ENCOUNTER — Other Ambulatory Visit (HOSPITAL_COMMUNITY): Payer: Self-pay

## 2023-03-03 ENCOUNTER — Other Ambulatory Visit: Payer: Self-pay | Admitting: Family Medicine

## 2023-03-03 MED ORDER — SIMVASTATIN 20 MG PO TABS
20.0000 mg | ORAL_TABLET | Freq: Every day | ORAL | 3 refills | Status: DC
  Filled 2023-03-03: qty 90, 90d supply, fill #0

## 2023-03-06 NOTE — Progress Notes (Signed)
Cardiology Office Note:    Date:  03/08/2023  ID:  Lisa Douglas, DOB 1927/11/30, MRN [id number] PCP: Shelva Majestic, MD  [facility] Providers Cardiologist:  None Cardiology APP:  Beatrice Lecher, PA-C       Patient Profile:      Permanent atrial fibrillation  TTE 03/04/12: EF 60, normal wall motion, mild MR, mild BAE, PASP 42  Hyperlipidemia  Hx of SMA embolism requiring thrombectomy  Hx of GI bleeding (gastric AVMs) GERD Macular degeneration  Venous insufficiency  Hx of CVA (noted on CT in 6/22)  Aortic atherosclerosis       History of Present Illness:   Lisa Douglas is a 89 y.o. female who returns for follow up of AFib. She was last seen in 02/2022.  She is here today with her niece, Clydie Braun.  She is doing well without chest discomfort, significant shortness of breath, orthopnea, syncope.  She does have lower extremity edema.  Right side is worse than the left.  This resolves with elevation.  Review of Systems  Gastrointestinal:  Negative for hematochezia and melena.  Genitourinary:  Negative for hematuria.   see HPI    Studies Reviewed:   EKG Interpretation Date/Time:  Tuesday March 08 2023 11:07:40 EDT Ventricular Rate:  76 PR Interval:    QRS Duration:  64 QT Interval:  364 QTC Calculation: 409 R Axis:   66  Text Interpretation: Coarse Atrial Fibrillation Low voltage QRS No significant change compared to prior ECGs Confirmed by Tereso Newcomer ([id number]) on 03/08/2023 11:15:17 AM   Risk Assessment/Calculations:   CHA2DS2-VASc Score = 6   This indicates a 9.7% annual risk of stroke. The patient's score is based upon: CHF History: 0 HTN History: 1 Diabetes History: 0 Stroke History: 2 Vascular Disease History: 0 Age Score: 2 Gender Score: 1            Physical Exam:   VS:  BP (!) 134/58   Pulse 76   Ht 5\' 4"  (1.626 m)   Wt 139 lb 6.4 oz (63.2 kg)   SpO2 96%   BMI 23.93 kg/m    Wt Readings from Last 3 Encounters:  03/08/23 139 lb 6.4 oz (63.2 kg)   12/10/22 136 lb 6.4 oz (61.9 kg)  11/16/22 139 lb (63 kg)    Constitutional:      Appearance: Healthy appearance. Not in distress.  Neck:     Vascular: No JVR. JVD normal.  Pulmonary:     Breath sounds: Normal breath sounds. No wheezing. No rales.  Cardiovascular:     Normal rate. Irregularly irregular rhythm.     Murmurs: There is no murmur.  Edema:    Peripheral edema present.    Pretibial: bilateral 2+ edema of the pretibial area.    Ankle: 1+ edema of the left ankle and 2+ edema of the right ankle. Abdominal:     Palpations: Abdomen is soft.      ASSESSMENT AND PLAN:   Permanent atrial fibrillation (HCC) Rate it is controlled.  She is tolerating anticoagulation.  She is followed in our Coumadin clinic.  Continue diltiazem 180 mg daily, Toprol-XL 25 mg daily, Coumadin as directed.  Follow-up in 1 year.  Hyperlipidemia Recent LDL in October 2023 was optimal at 57.  There is increased risk of myopathy with simvastatin 20 mg daily in the setting of diltiazem.  Reduce simvastatin to 10 mg daily.  Essential hypertension Blood pressure is well-controlled.  Continue diltiazem 180 mg daily,  Toprol-XL 25 mg daily.  Edema of both lower extremities due to peripheral venous insufficiency We discussed wearing compression hose as well as as needed furosemide.  She prefers not to take furosemide.  I have encouraged her to use compression hose daily and to continue to elevate legs.    Dispo:  Return in about 1 year (around 03/07/2024) for Routine Follow Up, w/ Tereso Newcomer, PA-C.  Signed, Tereso Newcomer, PA-C

## 2023-03-08 ENCOUNTER — Ambulatory Visit: Payer: Medicare Other | Attending: Physician Assistant | Admitting: Physician Assistant

## 2023-03-08 ENCOUNTER — Other Ambulatory Visit (HOSPITAL_COMMUNITY): Payer: Self-pay

## 2023-03-08 ENCOUNTER — Encounter: Payer: Self-pay | Admitting: Physician Assistant

## 2023-03-08 VITALS — BP 134/58 | HR 76 | Ht 64.0 in | Wt 139.4 lb

## 2023-03-08 DIAGNOSIS — I4821 Permanent atrial fibrillation: Secondary | ICD-10-CM | POA: Insufficient documentation

## 2023-03-08 DIAGNOSIS — I872 Venous insufficiency (chronic) (peripheral): Secondary | ICD-10-CM | POA: Diagnosis not present

## 2023-03-08 DIAGNOSIS — I10 Essential (primary) hypertension: Secondary | ICD-10-CM | POA: Diagnosis not present

## 2023-03-08 DIAGNOSIS — E7800 Pure hypercholesterolemia, unspecified: Secondary | ICD-10-CM | POA: Insufficient documentation

## 2023-03-08 MED ORDER — SIMVASTATIN 10 MG PO TABS
10.0000 mg | ORAL_TABLET | Freq: Every day | ORAL | 3 refills | Status: DC
  Filled 2023-03-08: qty 90, 90d supply, fill #0
  Filled 2023-05-24: qty 90, 90d supply, fill #1
  Filled 2023-08-22: qty 90, 90d supply, fill #2
  Filled 2023-11-21: qty 90, 90d supply, fill #3

## 2023-03-08 NOTE — Assessment & Plan Note (Signed)
Recent LDL in October 2023 was optimal at 57.  There is increased risk of myopathy with simvastatin 20 mg daily in the setting of diltiazem.  Reduce simvastatin to 10 mg daily.

## 2023-03-08 NOTE — Assessment & Plan Note (Signed)
We discussed wearing compression hose as well as as needed furosemide.  She prefers not to take furosemide.  I have encouraged her to use compression hose daily and to continue to elevate legs.

## 2023-03-08 NOTE — Assessment & Plan Note (Signed)
Blood pressure is well-controlled.  Continue diltiazem 180 mg daily, Toprol-XL 25 mg daily.

## 2023-03-08 NOTE — Assessment & Plan Note (Signed)
Rate it is controlled.  She is tolerating anticoagulation.  She is followed in our Coumadin clinic.  Continue diltiazem 180 mg daily, Toprol-XL 25 mg daily, Coumadin as directed.  Follow-up in 1 year.

## 2023-03-08 NOTE — Patient Instructions (Addendum)
Medication Instructions:  Your physician has recommended you make the following change in your medication:   REDUCE the Simvastatin to 10 mg taking 1 daily *If you need a refill on your cardiac medications before your next appointment, please call your pharmacy*   Lab Work: None ordered  If you have labs (blood work) drawn today and your tests are completely normal, you will receive your results only by: MyChart Message (if you have MyChart) OR A paper copy in the mail If you have any lab test that is abnormal or we need to change your treatment, we will call you to review the results.   Testing/Procedures: None ordered   Follow-Up: At [facility], you and your health needs are our priority.  As part of our continuing mission to provide you with exceptional heart care, we have created designated Provider Care Teams.  These Care Teams include your primary Cardiologist (physician) and Advanced Practice Providers (APPs -  Physician Assistants and Nurse Practitioners) who all work together to provide you with the care you need, when you need it.  We recommend signing up for the patient portal called "MyChart".  Sign up information is provided on this After Visit Summary.  MyChart is used to connect with patients for Virtual Visits (Telemedicine).  Patients are able to view lab/test results, encounter notes, upcoming appointments, etc.  Non-urgent messages can be sent to your provider as well.   To learn more about what you can do with MyChart, go to [email or web address].    Your next appointment:   1 year(s)  Provider:   Tereso Newcomer, PA-C         Other Instructions  Make sure to wear the Compression Hose everyday.

## 2023-03-09 ENCOUNTER — Other Ambulatory Visit (HOSPITAL_COMMUNITY): Payer: Self-pay

## 2023-03-11 DIAGNOSIS — M25562 Pain in left knee: Secondary | ICD-10-CM | POA: Diagnosis not present

## 2023-03-24 ENCOUNTER — Ambulatory Visit: Payer: Medicare Other | Attending: Physician Assistant | Admitting: *Deleted

## 2023-03-24 DIAGNOSIS — I4821 Permanent atrial fibrillation: Secondary | ICD-10-CM | POA: Diagnosis not present

## 2023-03-24 DIAGNOSIS — K55069 Acute infarction of intestine, part and extent unspecified: Secondary | ICD-10-CM | POA: Diagnosis not present

## 2023-03-24 DIAGNOSIS — Z7901 Long term (current) use of anticoagulants: Secondary | ICD-10-CM | POA: Insufficient documentation

## 2023-03-24 LAB — POCT INR: INR: 2.2 (ref 2.0–3.0)

## 2023-03-24 NOTE — Patient Instructions (Signed)
Description   Continue taking warfarin 1/2 tablet everyday, except 1 tablet Wednesdays.  Recheck INR in 6 weeks. [facility]in Clinic # [phone].

## 2023-04-06 ENCOUNTER — Other Ambulatory Visit (HOSPITAL_COMMUNITY): Payer: Self-pay

## 2023-04-06 ENCOUNTER — Other Ambulatory Visit: Payer: Self-pay | Admitting: Physician Assistant

## 2023-04-06 DIAGNOSIS — I4821 Permanent atrial fibrillation: Secondary | ICD-10-CM

## 2023-04-07 ENCOUNTER — Other Ambulatory Visit (HOSPITAL_COMMUNITY): Payer: Self-pay

## 2023-04-07 ENCOUNTER — Encounter (INDEPENDENT_AMBULATORY_CARE_PROVIDER_SITE_OTHER): Payer: Self-pay

## 2023-04-07 MED ORDER — WARFARIN SODIUM 3 MG PO TABS
ORAL_TABLET | ORAL | 3 refills | Status: DC
Start: 2023-04-07 — End: 2024-05-28
  Filled 2023-04-07: qty 60, 30d supply, fill #0
  Filled 2023-04-21: qty 60, 60d supply, fill #0
  Filled 2023-08-08: qty 60, 60d supply, fill #1
  Filled 2023-11-18: qty 60, 60d supply, fill #2
  Filled 2024-02-02: qty 60, 60d supply, fill #3

## 2023-04-07 NOTE — Telephone Encounter (Signed)
Refill request for warfarin:  Last INR was 2.2 on 03/24/23 Next INR due 05/05/23 LOV was 03/08/23  Refill approved.

## 2023-04-14 ENCOUNTER — Telehealth: Payer: Self-pay | Admitting: Family Medicine

## 2023-04-14 NOTE — Telephone Encounter (Signed)
Called and lm on pt vm tcb if needed.

## 2023-04-14 NOTE — Telephone Encounter (Signed)
Patient called wanting to speak with PCP nurse about her hernia. States that it used to be hard but now it's soft. I informed patient that best option would be for her to have an OV for evaluation, especially since PCP is not in office. Patient verbalized understanding but declined. Stated she would look into going to the hospital instead.

## 2023-04-21 ENCOUNTER — Other Ambulatory Visit (HOSPITAL_COMMUNITY): Payer: Self-pay

## 2023-04-22 ENCOUNTER — Other Ambulatory Visit (HOSPITAL_COMMUNITY): Payer: Self-pay

## 2023-04-23 ENCOUNTER — Other Ambulatory Visit (HOSPITAL_COMMUNITY): Payer: Self-pay

## 2023-05-05 ENCOUNTER — Ambulatory Visit: Payer: Medicare Other | Attending: Internal Medicine | Admitting: *Deleted

## 2023-05-05 DIAGNOSIS — Z7901 Long term (current) use of anticoagulants: Secondary | ICD-10-CM | POA: Insufficient documentation

## 2023-05-05 DIAGNOSIS — I4821 Permanent atrial fibrillation: Secondary | ICD-10-CM | POA: Insufficient documentation

## 2023-05-05 DIAGNOSIS — K55069 Acute infarction of intestine, part and extent unspecified: Secondary | ICD-10-CM | POA: Insufficient documentation

## 2023-05-05 LAB — POCT INR: INR: 3.4 — AB (ref 2.0–3.0)

## 2023-05-05 NOTE — Patient Instructions (Addendum)
Description   Do not take any warfarin today then continue taking warfarin 1/2 tablet everyday, except 1 tablet Wednesdays. Resume your leafy intake. Recheck INR in 4 weeks (normally 6 weeks). Cou[facility]in Clinic # [phone].

## 2023-05-12 ENCOUNTER — Encounter: Payer: Self-pay | Admitting: Family Medicine

## 2023-05-12 ENCOUNTER — Ambulatory Visit (INDEPENDENT_AMBULATORY_CARE_PROVIDER_SITE_OTHER): Payer: Medicare Other | Admitting: Family Medicine

## 2023-05-12 ENCOUNTER — Ambulatory Visit: Payer: Medicare Other | Admitting: Family Medicine

## 2023-05-12 VITALS — BP 122/60 | HR 72 | Temp 97.7°F | Ht 64.0 in | Wt 132.0 lb

## 2023-05-12 DIAGNOSIS — E785 Hyperlipidemia, unspecified: Secondary | ICD-10-CM

## 2023-05-12 DIAGNOSIS — E559 Vitamin D deficiency, unspecified: Secondary | ICD-10-CM

## 2023-05-12 DIAGNOSIS — I10 Essential (primary) hypertension: Secondary | ICD-10-CM

## 2023-05-12 DIAGNOSIS — Z23 Encounter for immunization: Secondary | ICD-10-CM | POA: Diagnosis not present

## 2023-05-12 DIAGNOSIS — I4821 Permanent atrial fibrillation: Secondary | ICD-10-CM | POA: Diagnosis not present

## 2023-05-12 DIAGNOSIS — G720 Drug-induced myopathy: Secondary | ICD-10-CM

## 2023-05-12 LAB — COMPREHENSIVE METABOLIC PANEL
ALT: 17 U/L (ref 0–35)
AST: 24 U/L (ref 0–37)
Albumin: 3.8 g/dL (ref 3.5–5.2)
Alkaline Phosphatase: 57 U/L (ref 39–117)
BUN: 15 mg/dL (ref 6–23)
CO2: 32 mEq/L (ref 19–32)
Calcium: 9.1 mg/dL (ref 8.4–10.5)
Chloride: 102 mEq/L (ref 96–112)
Creatinine, Ser: 0.66 mg/dL (ref 0.40–1.20)
GFR: 75.74 mL/min (ref >60.00)
Glucose, Bld: 89 mg/dL (ref 70–99)
Potassium: 3.9 mEq/L (ref 3.5–5.1)
Sodium: 141 mEq/L (ref 135–145)
Total Bilirubin: 0.6 mg/dL (ref 0.2–1.2)
Total Protein: 6.4 g/dL (ref 6.0–8.3)

## 2023-05-12 LAB — LIPID PANEL
Cholesterol: 139 mg/dL (ref 0–200)
HDL: 54.8 mg/dL (ref >39.00)
LDL Cholesterol: 60 mg/dL (ref 0–99)
NonHDL: 84.2
Total CHOL/HDL Ratio: 3
Triglycerides: 123 mg/dL (ref 0.0–149.0)
VLDL: 24.6 mg/dL (ref 0.0–40.0)

## 2023-05-12 LAB — CBC WITH DIFFERENTIAL/PLATELET
Basophils Absolute: 0 10*3/uL (ref 0.0–0.1)
Basophils Relative: 0.5 % (ref 0.0–3.0)
Eosinophils Absolute: 0.1 10*3/uL (ref 0.0–0.7)
Eosinophils Relative: 1.7 % (ref 0.0–5.0)
HCT: 42 % (ref 36.0–46.0)
Hemoglobin: 13.8 g/dL (ref 12.0–15.0)
Lymphocytes Relative: 24.9 % (ref 12.0–46.0)
Lymphs Abs: 1.6 10*3/uL (ref 0.7–4.0)
MCHC: 32.9 g/dL (ref 30.0–36.0)
MCV: 95.7 fl (ref 78.0–100.0)
Monocytes Absolute: 0.6 10*3/uL (ref 0.1–1.0)
Monocytes Relative: 10.2 % (ref 3.0–12.0)
Neutro Abs: 3.9 10*3/uL (ref 1.4–7.7)
Neutrophils Relative %: 62.7 % (ref 43.0–77.0)
Platelets: 245 10*3/uL (ref 150.0–400.0)
RBC: 4.38 Mil/uL (ref 3.87–5.11)
RDW: 14.7 % (ref 11.5–15.5)
WBC: 6.3 10*3/uL (ref 4.0–10.5)

## 2023-05-12 LAB — VITAMIN D 25 HYDROXY (VIT D DEFICIENCY, FRACTURES): VITD: 53.74 ng/mL (ref 30.00–100.00)

## 2023-05-12 NOTE — Progress Notes (Signed)
Phone [phone] In person visit   Subjective:   Lisa Douglas is a 89 y.o. year old very pleasant female patient who presents for/with See problem oriented charting Chief Complaint  Patient presents with   Medical Management of Chronic Issues   Hypertension   Hyperlipidemia   Past Medical History-  Patient Active Problem List   Diagnosis Date Noted   Permanent atrial fibrillation (HCC) 10/03/2007    Priority: High   Aortic atherosclerosis (HCC) 10/31/2018    Priority: Medium    Essential hypertension 04/27/2016    Priority: Medium    Superior mesenteric artery thrombosis (HCC) 03/03/2012    Priority: Medium    Anxiety state 10/03/2007    Priority: Medium    Advanced nonexudative age-related macular degeneration of both eyes without subfoveal involvement 10/03/2007    Priority: Medium    Osteoporosis 10/03/2007    Priority: Medium    Hyperlipidemia 10/02/2007    Priority: Medium    Angiodysplasia of colon 06/01/2004    Priority: Medium    Osteoarthritis of left knee 10/26/2016    Priority: Low   Epigastric hernia 12/23/2014    Priority: Low   Diverticulitis of colon 06/12/2014    Priority: Low   Long term current use of anticoagulant therapy 03/20/2012    Priority: Low   Leg cramps 12/30/2011    Priority: Low   Vitamin D deficiency 04/01/2011    Priority: Low   Edema of both lower 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     diltiazem (CARDIZEM CD) 180 MG 24 hr capsule Take 1 capsule (180  mg total) by mouth daily. 90 capsule 3   metoprolol succinate (TOPROL-XL) 25 MG 24 hr tablet Take 1 tablet (25 mg total) by mouth daily. 90 tablet 3   Multiple Vitamin (MULTIVITAMIN WITH MINERALS) TABS Take 1 tablet by mouth daily.     Multiple Vitamins-Minerals (PRESERVISION AREDS) CAPS Take 1 capsule by mouth in the morning and at bedtime.     simvastatin (ZOCOR) 10 MG tablet Take 1 tablet (10 mg total) by mouth at bedtime. 90 tablet 3   warfarin (COUMADIN) 3 MG tablet TAKE 1/2 TO 1 TABLET BY MOUTH AS DIRECTED ONCE DAILY BY THE COUMADIN  CLINIC 60 tablet 3   No current facility-administered medications for this visit.     Objective:  BP 122/60   Pulse 72   Temp 97.7 F (36.5 C)   Ht 5\' 4"  (1.626 m)   Wt 132 lb (59.9 kg)   SpO2 95%   BMI 22.66 kg/m  Gen: NAD, resting comfortably CV: RRR no murmurs rubs or gallops Lungs: CTAB no crackles, wheeze, rhonchi Abdomen: soft/nontender/nondistended/normal bowel sounds. No rebound or guarding.  -does have upper abdominal wall hernia- worse with standing- better with sitting- no pain Ext: trace edema- compression on left side Skin: warm, dry    Assessment and Plan   # Atrial fibrillation/also with history superior mesenteric artery thrombosis- secondary indication for long term coumadin (through [facility]) S:rate controlled on  metoprolol 25 mg XR and diltiazem 180mg  XR. Anticoagulated on coumadin through cardiology   -just saw cardiology 03/08/23 A/P: appropriately anticoagulated and rate controlled- continue current medicine  # Hypertension S:compliant with metoprolol 25 mg XR, diltiazem 180mg  XR  BP Readings from Last 3 Encounters:  05/12/23 122/60  03/08/23 (!) 134/58  12/10/22 132/72  A/P: stable- continue current medicines   # Hyperlipidemia/aortic atherosclerosis # Statin myalgia S:compliant with simvastatin 20 mg--> 10 mg due to myalgias and symptoms resolved.  Aortic atherosclerosis noted 01/28/15 on CT scan Lab  Results  Component Value Date   CHOL 136 06/10/2022   HDL 58.20 06/10/2022   LDLCALC 57 06/10/2022   TRIG 106.0 06/10/2022   CHOLHDL 2 06/10/2022  A/P: aortic atherosclerosis (presumed stable)- LDL goal ideally <70 - update lipid panel today   # Osteoporosis/vitamin D deficiency S:  Patient did not tolerate fosamax. Does not want injectables like prolia or reclast. She is on vitamin D (she opted not to take calcium). Declineds bone density retest as would not want medication.  - discussed importance of avoiding falls A/P: osteoporosis likely worsening but thankfully no falls in last 2 years. She wants to hold of on recheck -also has low vitamin D history- on 2000 units vitamin D per day  # GERD S: very sparing use of prilosec 20mg - effective when she uses.   A/P: no recent issues- not using medicine lately   #asymptomatic abdominal wall hernia- noted- offered surgical consult but she declines - wants to follow up only if becomes painful   Recommended follow up: Return in about 6 months (around 11/09/2023) for followup or sooner if needed.Schedule b4 you leave. Future Appointments  Date Time Provider Department Center  06/02/2023  3:15 PM [facility] None  11/22/2023  3:15 PM [facility] ANNUAL WELLNESS VISIT 1 [facility]   Lab/Order associations: FASTING   ICD-10-CM   1. Permanent atrial fibrillation (HCC)  I48.21     2. Essential hypertension  I10     3. Hyperlipidemia, unspecified hyperlipidemia type  E78.5     4. Vitamin D deficiency  E55.9     5. Need for influenza vaccination  Z23 Flu Vaccine Trivalent High Dose (Fluad)    6. Drug-induced myopathy  G72.0      No orders of the defined types were placed in this encounter.  Return precautions advised.  Tana Conch, MD

## 2023-05-12 NOTE — Patient Instructions (Addendum)
Please stop by lab before you go If you have mychart- we will send your results within 3 business days of [place] receiving them.  If you do not have mychart- we will call you about results within 5 business days of [place] receiving them.  *please also note that you will see labs on mychart as soon as they post. I will later go in and write notes on them- will say "notes from Dr. Durene Cal"   Thank you for doing your flu shot   Recommended follow up: Return in about 6 months (around 11/09/2023) for followup or sooner if needed.Schedule b4 you leave.

## 2023-05-19 ENCOUNTER — Other Ambulatory Visit: Payer: Self-pay | Admitting: Physician Assistant

## 2023-05-19 ENCOUNTER — Other Ambulatory Visit (HOSPITAL_COMMUNITY): Payer: Self-pay

## 2023-05-19 MED ORDER — DILTIAZEM HCL ER COATED BEADS 180 MG PO CP24
180.0000 mg | ORAL_CAPSULE | Freq: Every day | ORAL | 3 refills | Status: DC
  Filled 2023-05-19: qty 90, 90d supply, fill #0
  Filled 2023-08-18: qty 90, 90d supply, fill #1
  Filled 2023-11-18: qty 90, 90d supply, fill #2
  Filled 2024-02-02: qty 90, 90d supply, fill #3

## 2023-05-19 MED ORDER — METOPROLOL SUCCINATE ER 25 MG PO TB24
25.0000 mg | ORAL_TABLET | Freq: Every day | ORAL | 3 refills | Status: DC
  Filled 2023-05-19: qty 90, 90d supply, fill #0
  Filled 2023-08-18: qty 90, 90d supply, fill #1
  Filled 2023-11-18: qty 90, 90d supply, fill #2
  Filled 2024-02-02: qty 90, 90d supply, fill #3

## 2023-05-20 ENCOUNTER — Other Ambulatory Visit (HOSPITAL_COMMUNITY): Payer: Self-pay

## 2023-05-26 ENCOUNTER — Other Ambulatory Visit (HOSPITAL_COMMUNITY): Payer: Self-pay

## 2023-06-02 ENCOUNTER — Ambulatory Visit: Payer: Medicare Other | Attending: Physician Assistant

## 2023-06-02 DIAGNOSIS — Z7901 Long term (current) use of anticoagulants: Secondary | ICD-10-CM | POA: Insufficient documentation

## 2023-06-02 DIAGNOSIS — I4821 Permanent atrial fibrillation: Secondary | ICD-10-CM | POA: Insufficient documentation

## 2023-06-02 DIAGNOSIS — K55069 Acute infarction of intestine, part and extent unspecified: Secondary | ICD-10-CM | POA: Diagnosis not present

## 2023-06-02 LAB — POCT INR: INR: 2.2 (ref 2.0–3.0)

## 2023-06-02 NOTE — Patient Instructions (Signed)
Description   Continue taking warfarin 1/2 tablet everyday, except 1 tablet Wednesdays.  Continue your leafy intake.  Recheck INR in 5 weeks (normally 6 weeks).  [facility] Clinic # [phone].

## 2023-07-07 ENCOUNTER — Ambulatory Visit: Payer: Medicare Other | Attending: Physician Assistant

## 2023-07-07 DIAGNOSIS — I4821 Permanent atrial fibrillation: Secondary | ICD-10-CM

## 2023-07-07 DIAGNOSIS — K55069 Acute infarction of intestine, part and extent unspecified: Secondary | ICD-10-CM | POA: Diagnosis not present

## 2023-07-07 DIAGNOSIS — Z7901 Long term (current) use of anticoagulants: Secondary | ICD-10-CM | POA: Diagnosis not present

## 2023-07-07 LAB — POCT INR: INR: 2.7 (ref 2.0–3.0)

## 2023-07-07 NOTE — Patient Instructions (Signed)
Description   HOLD today's dose and then continue taking warfarin 1/2 tablet everyday, except 1 tablet Wednesdays.  Continue your leafy intake.  Recheck INR in 5 weeks.  Coumadin Clinic # [phone].

## 2023-08-01 ENCOUNTER — Encounter: Payer: Self-pay | Admitting: Family Medicine

## 2023-08-01 ENCOUNTER — Ambulatory Visit (INDEPENDENT_AMBULATORY_CARE_PROVIDER_SITE_OTHER): Payer: Medicare Other | Admitting: Family Medicine

## 2023-08-01 VITALS — BP 119/70 | HR 76 | Temp 98.2°F | Ht 64.0 in | Wt 130.6 lb

## 2023-08-01 DIAGNOSIS — I10 Essential (primary) hypertension: Secondary | ICD-10-CM | POA: Diagnosis not present

## 2023-08-01 DIAGNOSIS — I4821 Permanent atrial fibrillation: Secondary | ICD-10-CM

## 2023-08-01 DIAGNOSIS — W19XXXA Unspecified fall, initial encounter: Secondary | ICD-10-CM

## 2023-08-01 DIAGNOSIS — M25511 Pain in right shoulder: Secondary | ICD-10-CM

## 2023-08-01 NOTE — Progress Notes (Signed)
Lisa Douglas is a 89 y.o. female who presents today for an office visit.  Assessment/Plan:  New/Acute Problems: Right Shoulder Pain No obvious abnormalities on exam however given her recent fall we will check plain film to rule out fracture or subluxation/dislocation.  She does have quite a bit of pain with resisted rotator cuff testing today and likely does have some rotator cuff tendinopathy.  Likely has some underlying osteoarthritis as well.  We cannot do NSAIDs due to her being anticoagulated on warfarin.  She can continue with Tylenol.  Will place referral to PT for trial of physical therapy.  She will let us know if not improving in the next couple weeks and we can refer to orthopedics or sports medicine at that time.  Fall Unclear etiology with fall though sounds like it may have been mechanical in nature and while trying to pick something up in her kitchen.  Will be referring her to PT for gait evaluation and assistance as well.  It is important that she avoid falls due to being anticoagulated on warfarin.  She did report hitting her head however she has no obvious abrasions, lacerations, or contusions on today's exam and she has a normal neurologic exam.  Additionally she has no red flag signs or symptoms concerning for intracranial bleed - do not think we need to obtain imaging at this point given that her injury was 2 weeks ago and overall reassuring exam today.  We discussed reasons to return to care and seek emergent care.  Chronic Problems Addressed Today: A-fib / Hypertension Anticoagulated on warfarin.  Blood pressure and heart rate controlled on diltiazem and metoprolol.  Need to avoid NSAIDs due to her being anticoagulated.  It is possible that the metoprolol diltiazem may have contributed to her fall however she denies any orthostatic symptoms and blood pressure is at goal today.  We will not adjust any of her medications at this point.  Though will continue to monitor and follow  up with her PCP or cardiologist if she has any symptomatic lows.    Subjective:  HPI:  See Assessment / plan for status of chronic conditions. Patient here with shoulder pain. This started a couple of week ago after falling while in her kitchin. States that she was trying to pick up something and lost balance and fell into the corner. She did hit her head. She is not sure if she hit her shoulder. She scooted into another room and was able call for help. Tylenol helps modestly. Symptoms come and go. Mostly her with motion. Hard for her to lift her arm up above her head.  No headache.  No vision changes.  No numbness or tingling.  Pain mostly located in her right upper shoulder.  Worse with reaching above her head.       Objective:  Physical Exam: BP 119/70   Pulse 76   Temp 98.2 F (36.8 C) (Temporal)   Ht 5\' 4"  (1.626 m)   Wt 130 lb 9.6 oz (59.2 kg)   SpO2 95%   BMI 22.42 kg/m   Gen: No acute distress, resting comfortably HEENT: No scalp abrasions or contusions noted. CV: Regular rate and rhythm with no murmurs appreciated Pulm: Normal work of breathing, clear to auscultation bilaterally with no crackles, wheezes, or rhonchi MUSCULOSKELETAL: - Right arm: No deformities.  Limited range of motion with abduction and extension at the right shoulder secondary to pain.  Pain elicited with resisted supraspinatus testing.  Pain also  elicited with internal and external rotation.  Neurovascular intact distally. Neuro: Cranial nerves II through XII intact.  Moves all extremities spontaneously.  Sensation light touch intact throughout. Psych: Normal affect and thought content      Lisa Douglas M. Jimmey Ralph, MD 08/01/2023 2:47 PM

## 2023-08-01 NOTE — Patient Instructions (Signed)
It was very nice to see you today!  It think your pain is probably coming from your rotator cuff.  I would like for you to see a physical therapist for this.  Will also check an x-ray to make sure nothing was broken.  Let us know if not improving in the next couple of weeks.  Return if symptoms worsen or fail to improve.   Take care, Dr Jimmey Ralph  PLEASE NOTE:  If you had any lab tests, please let us know if you have not heard back within a few days. You may see your results on mychart before we have a chance to review them but we will give you a call once they are reviewed by [place].   If we ordered any referrals today, please let us know if you have not heard from their office within the next week.   If you had any urgent prescriptions sent in today, please check with the pharmacy within an hour of our visit to make sure the prescription was transmitted appropriately.   Please try these tips to maintain a healthy lifestyle:  Eat at least 3 REAL meals and 1-2 snacks per day.  Aim for no more than 5 hours between eating.  If you eat breakfast, please do so within one hour of getting up.   Each meal should contain half fruits/vegetables, one quarter protein, and one quarter carbs (no bigger than a computer mouse)  Cut down on sweet beverages. This includes juice, soda, and sweet tea.   Drink at least 1 glass of water with each meal and aim for at least 8 glasses per day  Exercise at least 150 minutes every week.

## 2023-08-02 ENCOUNTER — Ambulatory Visit (INDEPENDENT_AMBULATORY_CARE_PROVIDER_SITE_OTHER)
Admission: RE | Admit: 2023-08-02 | Discharge: 2023-08-02 | Disposition: A | Discharge status: Home / Self Care | Payer: Medicare Other | Source: Ambulatory Visit | Attending: Family Medicine | Admitting: Family Medicine

## 2023-08-02 DIAGNOSIS — M25511 Pain in right shoulder: Secondary | ICD-10-CM

## 2023-08-02 DIAGNOSIS — M19011 Primary osteoarthritis, right shoulder: Secondary | ICD-10-CM | POA: Diagnosis not present

## 2023-08-08 ENCOUNTER — Other Ambulatory Visit (HOSPITAL_COMMUNITY): Payer: Self-pay

## 2023-08-11 ENCOUNTER — Other Ambulatory Visit (HOSPITAL_COMMUNITY): Payer: Self-pay

## 2023-08-11 ENCOUNTER — Ambulatory Visit: Payer: Medicare Other | Attending: Physician Assistant | Admitting: *Deleted

## 2023-08-11 DIAGNOSIS — Z7901 Long term (current) use of anticoagulants: Secondary | ICD-10-CM

## 2023-08-11 DIAGNOSIS — K55069 Acute infarction of intestine, part and extent unspecified: Secondary | ICD-10-CM

## 2023-08-11 DIAGNOSIS — I4821 Permanent atrial fibrillation: Secondary | ICD-10-CM | POA: Diagnosis not present

## 2023-08-11 LAB — POCT INR: INR: 2.6 (ref 2.0–3.0)

## 2023-08-11 NOTE — Patient Instructions (Signed)
Description   Have a serving of leafy veggies tomorrow and then continue taking warfarin 1/2 tablet everyday, except 1 tablet Wednesdays.  Continue your leafy intake.  Recheck INR in 5 weeks.  [facility] # [phone].

## 2023-08-16 NOTE — Progress Notes (Signed)
Her xray showed arthritis but no fracture or dislocation. She should let us know if not improving with physical therapy and we can refer to orthopedics or sports medicine.

## 2023-08-18 ENCOUNTER — Other Ambulatory Visit (HOSPITAL_COMMUNITY): Payer: Self-pay

## 2023-08-22 ENCOUNTER — Other Ambulatory Visit (HOSPITAL_COMMUNITY): Payer: Self-pay

## 2023-08-25 ENCOUNTER — Other Ambulatory Visit (HOSPITAL_COMMUNITY): Payer: Self-pay

## 2023-09-15 ENCOUNTER — Ambulatory Visit: Payer: Medicare Other | Attending: Internal Medicine

## 2023-09-15 DIAGNOSIS — K55069 Acute infarction of intestine, part and extent unspecified: Secondary | ICD-10-CM

## 2023-09-15 DIAGNOSIS — I4821 Permanent atrial fibrillation: Secondary | ICD-10-CM

## 2023-09-15 DIAGNOSIS — Z7901 Long term (current) use of anticoagulants: Secondary | ICD-10-CM

## 2023-09-15 LAB — POCT INR: INR: 2.2 (ref 2.0–3.0)

## 2023-09-15 NOTE — Patient Instructions (Signed)
Description   Continue taking warfarin 1/2 tablet everyday, except 1 tablet Wednesdays.  Continue your leafy intake.  Recheck INR in 6 weeks.  [facility] # [phone].

## 2023-10-27 ENCOUNTER — Ambulatory Visit: Payer: Medicare Other | Attending: Cardiology

## 2023-10-27 DIAGNOSIS — I4821 Permanent atrial fibrillation: Secondary | ICD-10-CM | POA: Diagnosis not present

## 2023-10-27 DIAGNOSIS — Z7901 Long term (current) use of anticoagulants: Secondary | ICD-10-CM | POA: Diagnosis not present

## 2023-10-27 DIAGNOSIS — K55069 Acute infarction of intestine, part and extent unspecified: Secondary | ICD-10-CM | POA: Diagnosis not present

## 2023-10-27 LAB — POCT INR: INR: 2.4 (ref 2.0–3.0)

## 2023-10-27 NOTE — Patient Instructions (Signed)
 Description   Continue taking warfarin 1/2 tablet everyday, except 1 tablet Wednesdays.  Continue your leafy intake.  Recheck INR in 6 weeks.  [facility] Clinic # [phone].

## 2023-11-09 ENCOUNTER — Ambulatory Visit (INDEPENDENT_AMBULATORY_CARE_PROVIDER_SITE_OTHER): Payer: Medicare Other | Admitting: Family Medicine

## 2023-11-09 ENCOUNTER — Encounter: Payer: Self-pay | Admitting: Family Medicine

## 2023-11-09 VITALS — BP 122/60 | HR 62 | Temp 98.1°F | Ht 64.0 in | Wt 127.8 lb

## 2023-11-09 DIAGNOSIS — I10 Essential (primary) hypertension: Secondary | ICD-10-CM | POA: Diagnosis not present

## 2023-11-09 DIAGNOSIS — I4821 Permanent atrial fibrillation: Secondary | ICD-10-CM

## 2023-11-09 DIAGNOSIS — E7800 Pure hypercholesterolemia, unspecified: Secondary | ICD-10-CM | POA: Diagnosis not present

## 2023-11-09 DIAGNOSIS — K55069 Acute infarction of intestine, part and extent unspecified: Secondary | ICD-10-CM | POA: Diagnosis not present

## 2023-11-09 DIAGNOSIS — I700 Atherosclerosis of aorta: Secondary | ICD-10-CM | POA: Diagnosis not present

## 2023-11-09 LAB — CBC WITH DIFFERENTIAL/PLATELET
Basophils Absolute: 0 10*3/uL (ref 0.0–0.1)
Basophils Relative: 0.5 % (ref 0.0–3.0)
Eosinophils Absolute: 0.1 10*3/uL (ref 0.0–0.7)
Eosinophils Relative: 1 % (ref 0.0–5.0)
HCT: 41 % (ref 36.0–46.0)
Hemoglobin: 13.5 g/dL (ref 12.0–15.0)
Lymphocytes Relative: 16.6 % (ref 12.0–46.0)
Lymphs Abs: 1 10*3/uL (ref 0.7–4.0)
MCHC: 32.9 g/dL (ref 30.0–36.0)
MCV: 96.3 fl (ref 78.0–100.0)
Monocytes Absolute: 0.6 10*3/uL (ref 0.1–1.0)
Monocytes Relative: 10.2 % (ref 3.0–12.0)
Neutro Abs: 4.2 10*3/uL (ref 1.4–7.7)
Neutrophils Relative %: 71.7 % (ref 43.0–77.0)
Platelets: 208 10*3/uL (ref 150.0–400.0)
RBC: 4.25 Mil/uL (ref 3.87–5.11)
RDW: 13.9 % (ref 11.5–15.5)
WBC: 5.9 10*3/uL (ref 4.0–10.5)

## 2023-11-09 LAB — COMPREHENSIVE METABOLIC PANEL
ALT: 11 U/L (ref 0–35)
AST: 19 U/L (ref 0–37)
Albumin: 4.3 g/dL (ref 3.5–5.2)
Alkaline Phosphatase: 67 U/L (ref 39–117)
BUN: 16 mg/dL (ref 6–23)
CO2: 32 meq/L (ref 19–32)
Calcium: 9.2 mg/dL (ref 8.4–10.5)
Chloride: 101 meq/L (ref 96–112)
Creatinine, Ser: 0.59 mg/dL (ref 0.40–1.20)
GFR: 77.55 mL/min (ref >60.00)
Glucose, Bld: 97 mg/dL (ref 70–99)
Potassium: 4.5 meq/L (ref 3.5–5.1)
Sodium: 139 meq/L (ref 135–145)
Total Bilirubin: 0.9 mg/dL (ref 0.2–1.2)
Total Protein: 6.5 g/dL (ref 6.0–8.3)

## 2023-11-09 LAB — LDL CHOLESTEROL, DIRECT: Direct LDL: 68 mg/dL

## 2023-11-09 NOTE — Progress Notes (Signed)
 Phone [phone] In person visit   Subjective:   Lisa Douglas is a 89 y.o. year old very pleasant female patient who presents for/with See problem oriented charting Chief Complaint  Patient presents with   Medical Management of Chronic Issues   Hypertension   Hyperlipidemia   Past Medical History-  Patient Active Problem List   Diagnosis Date Noted   Permanent atrial fibrillation (HCC) 10/03/2007    Priority: High   Aortic atherosclerosis (HCC) 10/31/2018    Priority: Medium    Essential hypertension 04/27/2016    Priority: Medium    Superior mesenteric artery thrombosis (HCC) 03/03/2012    Priority: Medium    Anxiety state 10/03/2007    Priority: Medium    Advanced nonexudative age-related macular degeneration of both eyes without subfoveal involvement 10/03/2007    Priority: Medium    Osteoporosis 10/03/2007    Priority: Medium    Hyperlipidemia 10/02/2007    Priority: Medium    Angiodysplasia of colon 06/01/2004    Priority: Medium    Osteoarthritis of left knee 10/26/2016    Priority: Low   Epigastric hernia 12/23/2014    Priority: Low   Diverticulitis of colon 06/12/2014    Priority: Low   Long term current use of anticoagulant therapy 03/20/2012    Priority: Low   Leg cramps 12/30/2011    Priority: Low   Vitamin D deficiency 04/01/2011    Priority: Low   Edema of both lower 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     diltiazem (CARDIZEM CD) 180 MG 24 hr capsule Take 1 capsule (180  mg total) by mouth daily. 90 capsule 3   metoprolol succinate (TOPROL-XL) 25 MG 24 hr tablet Take 1 tablet (25 mg total) by mouth daily. 90 tablet 3   Multiple Vitamin (MULTIVITAMIN WITH MINERALS) TABS Take 1 tablet by mouth daily.     Multiple Vitamins-Minerals (PRESERVISION AREDS) CAPS Take 1 capsule by mouth in the morning and at bedtime.     simvastatin (ZOCOR) 10 MG tablet Take 1 tablet (10 mg total) by mouth at bedtime. 90 tablet 3   warfarin (COUMADIN) 3 MG tablet TAKE 1/2 TO 1 TABLET BY MOUTH AS DIRECTED ONCE DAILY BY THE COUMADIN  CLINIC 60 tablet 3   No current facility-administered medications for this visit.     Objective:  BP 122/60   Pulse 62   Temp 98.1 F (36.7 C)   Ht 5\' 4"  (1.626 m)   Wt 127 lb 12.8 oz (58 kg)   SpO2 96%   BMI 21.94 kg/m  Gen: NAD, resting comfortably CV: RRR no murmurs rubs or gallops Lungs: CTAB no crackles, wheeze, rhonchi Ext: no edema- wears compression on right side Skin: warm, dry     Assessment and Plan   # Right shoulder pain seen by Dr. Jimmey Ralph on 11/09/2023 with x-ray showing some arthritis and referred to physical therapy- Patient called back stating she feels she no longer needs PT. States her arm took care of itself and she is  fine now-though appears Dr. Jimmey Ralph primarily referred to help her avoid falls. Still wanting to hold off on patient even for gait and balance- has felt more stable since then- sitting down in kitchen to do any work   # Atrial fibrillation/also with history superior mesenteric artery thrombosis- secondary indication for long term coumadin (through [facility] card clinic) S: Medication: Metoprolol 25 mg XR and diltiazem 180mg  XR. Anticoagulated on coumadin through cardiology   A/P: a fib appropriately anticoagulated and rate controlled- continue current medicine Sma thrombosis- remain on coumadin  # Hypertension S:compliant with metoprolol 25 mg XR, diltiazem 180mg  XR  BP Readings from Last 3 Encounters:  11/09/23  122/60  08/01/23 119/70  05/12/23 122/60  A/P: well controlled continue current medications   # Hyperlipidemia/aortic atherosclerosis # Statin myalgia S:compliant with simvastatin 20 mg--> 10 mg due to myalgias.  Aortic atherosclerosis noted 01/28/15 on CT scan. She wawtches her diet Lab Results  Component Value Date   CHOL 139 05/12/2023   HDL 54.80 05/12/2023   LDLCALC 60 05/12/2023   TRIG 123.0 05/12/2023   CHOLHDL 3 05/12/2023  A/P: lipids at goal- continue current medications  Aortic atherosclerosis (presumed stable)- LDL goal ideally <70 - continue current medications with LDL at 60   # Osteoporosis/vitamin D deficiency S:  Patient did not tolerate fosamax. Does not want injectables like prolia or reclast. She is on vitamin D (she opted not to take calcium). Declines bone density retest as would not want medication- reviewed options today.  discussed importance of avoiding falls A/P:osteoporosis noted but declines bone density  #abdominal wall hernia- noted- offered surgical consult but she declines- not bothering her- continue to monitor - seek care if develops pain   Recommended follow up: Return in about 6 months (around 05/11/2024) for followup or sooner if needed.Schedule b4 you leave. Future Appointments  Date Time Provider Department Center  11/22/2023  3:00 PM [facility] ANNUAL WELLNESS VISIT 1 [facility]  12/08/2023  2:15 PM [facility] None    Lab/Order associations:   ICD-10-CM   1. Pure hypercholesterolemia  E78.00 Comprehensive metabolic panel    CBC with Differential/Platelet    LDL cholesterol, direct    2. Permanent atrial fibrillation (HCC) Chronic I48.21     3. Essential hypertension  I10 Comprehensive metabolic panel    CBC with Differential/Platelet    LDL cholesterol, direct    4. Superior mesenteric artery thrombosis (HCC)  K55.069     5. Aortic atherosclerosis (HCC)  I70.0       No orders of the defined types were  placed in this encounter.   Return precautions advised.  Tana Conch, MD

## 2023-11-09 NOTE — Patient Instructions (Addendum)
 Please stop by lab before you go If you have mychart- we will send your results within 3 business days of [place] receiving them.  If you do not have mychart- we will call you about results within 5 business days of [place] receiving them.  *please also note that you will see labs on mychart as soon as they post. I will later go in and write notes on them- will say "notes from Dr. Durene Cal"   Recommended follow up: Return in about 6 months (around 05/11/2024) for followup or sooner if needed.Schedule b4 you leave.

## 2023-11-18 ENCOUNTER — Other Ambulatory Visit: Payer: Self-pay

## 2023-11-21 ENCOUNTER — Other Ambulatory Visit (HOSPITAL_COMMUNITY): Payer: Self-pay

## 2023-11-22 ENCOUNTER — Ambulatory Visit (INDEPENDENT_AMBULATORY_CARE_PROVIDER_SITE_OTHER): Payer: Medicare Other

## 2023-11-22 VITALS — Ht 64.0 in | Wt 127.0 lb

## 2023-11-22 DIAGNOSIS — Z0000 Encounter for general adult medical examination without abnormal findings: Secondary | ICD-10-CM

## 2023-11-22 NOTE — Progress Notes (Signed)
 Subjective:   Lisa Douglas is a 89 y.o. who presents for a Medicare Wellness preventive visit.  Visit Complete: Virtual I connected with  Lisa Douglas on 11/22/23 by a audio enabled telemedicine application and verified that I am speaking with the correct person using two identifiers.  Patient Location: Home  Provider Location: Office/Clinic  I discussed the limitations of evaluation and management by telemedicine. The patient expressed understanding and agreed to proceed.  Vital Signs: Because this visit was a virtual/telehealth visit, some criteria may be missing or patient reported. Any vitals not documented were not able to be obtained and vitals that have been documented are patient reported.  VideoDeclined- This patient declined Librarian, academic. Therefore the visit was completed with audio only.  Persons Participating in Visit: Patient.  AWV Questionnaire: No: Patient Medicare AWV questionnaire was not completed prior to this visit.  Cardiac Risk Factors include: advanced age (>68men, >30 women);dyslipidemia;hypertension     Objective:    Today's Vitals   11/22/23 1503  Weight: 127 lb (57.6 kg)  Height: 5\' 4"  (1.626 m)   Body mass index is 21.8 kg/m.     11/22/2023    3:08 PM 11/16/2022    4:09 PM 09/28/2021    2:43 PM 05/08/2019    9:02 AM 05/02/2018    9:57 AM 04/28/2017    9:27 AM 06/30/2016   12:11 PM  Advanced Directives  Does Patient Have a Medical Advance Directive? No Yes No Yes Yes Yes Yes  Type of Furniture conservator/restorer;Living will  Living will;Healthcare Power of Attorney Living will;Healthcare Power of [employer] Power of [employer];Living will  Does patient want to make changes to medical advance directive?   No - Patient declined No - Patient declined No - Patient declined  No - Patient declined  Copy of Healthcare Power of Attorney in Chart?  No - copy requested  No - copy requested No - copy  requested  No - copy requested  Would patient like information on creating a medical advance directive? No - Patient declined      Yes - Educational materials given    Current Medications (verified) Outpatient Encounter Medications as of 11/22/2023  Medication Sig   Cholecalciferol (VITAMIN D) 2000 UNITS CAPS Take 2,000 Units by mouth daily.   diltiazem (CARDIZEM CD) 180 MG 24 hr capsule Take 1 capsule (180 mg total) by mouth daily.   metoprolol succinate (TOPROL-XL) 25 MG 24 hr tablet Take 1 tablet (25 mg total) by mouth daily.   Multiple Vitamin (MULTIVITAMIN WITH MINERALS) TABS Take 1 tablet by mouth daily.   Multiple Vitamins-Minerals (PRESERVISION AREDS) CAPS Take 1 capsule by mouth in the morning and at bedtime.   simvastatin (ZOCOR) 10 MG tablet Take 1 tablet (10 mg total) by mouth at bedtime.   warfarin (COUMADIN) 3 MG tablet TAKE 1/2 TO 1 TABLET BY MOUTH AS DIRECTED ONCE DAILY BY THE COUMADIN  CLINIC   No facility-administered encounter medications on file as of 11/22/2023.    Allergies (verified) Penicillins, Alendronate sodium, Atorvastatin, and Morphine and codeine   History: Past Medical History:  Diagnosis Date   Anemia due to blood loss, acute 03/12/2012   After surgery for superior mesenteric artery thrombosis. Not current problem.     Angiodysplasia of intestine (without mention of hemorrhage)    Anxiety state, unspecified    Atrial fibrillation (HCC)    Diverticulosis of colon (without mention of hemorrhage)  Diverticulosis of colon with hemorrhage 06/12/2014   Esophageal reflux    Macular degeneration (senile) of retina, unspecified    Mesenteric embolus (HCC)    Osteoarthrosis, unspecified whether generalized or localized, unspecified site    Osteoporosis, unspecified    Other and unspecified hyperlipidemia    Unspecified venous (peripheral) insufficiency    Past Surgical History:  Procedure Laterality Date   APPENDECTOMY  1951   CATARACT EXTRACTION Left  04/19/2017   LAPAROTOMY  03/07/2012   Procedure: EXPLORATORY LAPAROTOMY;  Surgeon: Chuck Hint, MD;  Location: [facility] OR;  Service: Vascular;  Laterality: N/A;  Exploratory Laparotomy with Evacuation of Hematoma.   TONSILLECTOMY  1943   Family History  Problem Relation Age of Onset   Hemochromatosis Father        tested in [place], denies history   Heart disease Sister    Breast cancer Sister        died 26, not sure when she had it   Lymphoma Sister    Social History   Socioeconomic History   Marital status: Single    Spouse name: Not on file   Number of children: Not on file   Years of education: Not on file   Highest education level: Not on file  Occupational History   Occupation: retired  Tobacco Use   Smoking status: Former    Current packs/day: 0.00    Average packs/day: 0.3 packs/day for 2.0 years (0.5 ttl pk-yrs)    Types: Cigarettes    Start date: 08/23/1949    Quit date: 08/24/1951    Years since quitting: 72.2   Smokeless tobacco: Never   Tobacco comments:    smoked at age 81 for a short time--a few months  Vaping Use   Vaping status: Never Used  Substance and Sexual Activity   Alcohol use: No   Drug use: No   Sexual activity: Not on file  Other Topics Concern   Not on file  Social History Narrative   Lives alone.    Previously lived with college age student that she helped raise Pattricia Boss, her niece's younger daughter, graduating may 2017)   Never married. Had 8 siblings total- lost last in 2021.       Born and raised in [place]. L[place] [facility] house [place].    Worked for 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       Hobbies: used to sew and knit (vision issues now), used to bowl   Now enjoys computer time-keeps up with family, not a big tv time   Social Drivers of Corporate investment banker Strain: Low  Risk  (11/22/2023)   Overall Financial Resource Strain (CARDIA)    Difficulty of Paying Living Expenses: Not hard at all  Food Insecurity: No Food Insecurity (11/22/2023)   Hunger Vital Sign    Worried About Running Out of Food in the Last Year: Never true    Ran Out of Food in the Last Year: Never true  Transportation Needs: No Transportation Needs (11/22/2023)   PRAPARE - Administrator, Civil Service (Medical): No    Lack of Transportation (Non-Medical): No  Physical Activity: Inactive (11/22/2023)   Exercise Vital Sign    Days of Exercise per Week: 0 days    Minutes of Exercise per Session: 0  min  Stress: No Stress Concern Present (11/22/2023)   [employer] of Occupational Health - Occupational Stress Questionnaire    Feeling of Stress : Not at all  Social Connections: Moderately Isolated (11/22/2023)   Social Connection and Isolation Panel [NHANES]    Frequency of Communication with Friends and Family: More than three times a week    Frequency of Social Gatherings with Friends and Family: Once a week    Attends Religious Services: 1 to 4 times per year    Active Member of [employer] or Organizations: No    Attends Engineer, structural: Never    Marital Status: Never married    Tobacco Counseling Counseling given: Not Answered Tobacco comments: smoked at age 60 for a short time--a few months    Clinical Intake:  Pre-visit preparation completed: Yes  Pain : No/denies pain     BMI - recorded: 21.8 Nutritional Status: BMI of 19-24  Normal Nutritional Risks: None Diabetes: No  No results found for: "HGBA1C"   How often do you need to have someone help you when you read instructions, pamphlets, or other written materials from your doctor or pharmacy?: 1 - Never  Interpreter Needed?: No  Information entered by :: Lanier Ensign, LPN   Activities of Daily Living      11/22/2023    3:04 PM  In your present state of health, do you have any difficulty  performing the following activities:  Hearing? 0  Vision? 0  Difficulty concentrating or making decisions? 0  Walking or climbing stairs? 0  Dressing or bathing? 0  Doing errands, shopping? 0  Preparing Food and eating ? N  Using the Toilet? N  In the past six months, have you accidently leaked urine? N  Do you have problems with loss of bowel control? N  Managing your Medications? N  Managing your Finances? N  Housekeeping or managing your Housekeeping? N    Patient Care Team: Shelva Majestic, MD as PCP - General (Family Medicine) Mckinley Jewel, MD as Consulting Physician (Ophthalmology) Eber Jones, MD as Referring Physician (Ophthalmology) Kennon Rounds as Physician Assistant (Cardiology)  Indicate any recent Medical Services you may have received from other than [facility] providers in the past year (date may be approximate).     Assessment:   This is a routine wellness examination for [facility].  Hearing/Vision screen Hearing Screening - Comments:: Pt denies any hearing issues  Vision Screening - Comments:: Pt stated she was unable to follow up based on provider examination    Goals Addressed             This Visit's Progress    Patient Stated       Maintain health and activity        Depression Screen      11/22/2023    3:07 PM 05/12/2023   10:00 AM 12/10/2022   10:12 AM 11/16/2022    4:07 PM 09/28/2021    2:39 PM 06/02/2021    9:42 AM 06/04/2020   12:59 PM  PHQ 2/9 Scores  PHQ - 2 Score 0 0 1 0 0 0 0  PHQ- 9 Score  3 2        Fall Risk      11/22/2023    3:10 PM 08/01/2023    2:34 PM 05/12/2023   10:00 AM 11/16/2022    4:09 PM 09/28/2021    2:44 PM  Fall Risk   Falls in the past year? 1  1 0 0 1  Number falls in past yr: 1 1 0 0 1  Injury with Fall? 1 1 0 0 1  Comment     fell in the kitchen and had soreness  Risk for fall due to : Impaired mobility;Impaired balance/gait;History of fall(s) No Fall Risks No Fall Risks Impaired mobility;Impaired  balance/gait;Impaired vision Impaired vision;Impaired mobility;Impaired balance/gait  Follow up Falls prevention discussed  Falls evaluation completed Falls prevention discussed Falls prevention discussed    MEDICARE RISK AT HOME:  Medicare Risk at Home Any stairs in or around the home?: Yes If so, are there any without handrails?: No Home free of loose throw rugs in walkways, pet beds, electrical cords, etc?: Yes Adequate lighting in your home to reduce risk of falls?: Yes Life alert?: Yes Use of a cane, walker or w/c?: Yes Grab bars in the bathroom?: No Shower chair or bench in shower?: Yes Elevated toilet seat or a handicapped toilet?: Yes  TIMED UP AND GO:  Was the test performed?  No  Cognitive Function: 6CIT completed    04/28/2017    9:37 AM  MMSE - Mini Mental State Exam  Not completed: --        11/22/2023    3:12 PM 11/16/2022    4:10 PM 09/28/2021    2:47 PM 05/08/2019    9:03 AM 04/28/2017    9:37 AM  6CIT Screen  What Year? 0 points 0 points 0 points 0 points 0 points  What month? 0 points 0 points 0 points 0 points 0 points  What time? 0 points 0 points 0 points 0 points 0 points  Count back from 20 0 points 0 points 0 points 0 points 0 points  Months in reverse 0 points 0 points 0 points 0 points 0 points  Repeat phrase 4 points 0 points 0 points 0 points 0 points  Total Score 4 points 0 points 0 points 0 points 0 points    Immunizations Immunization History  Administered Date(s) Administered   Fluad Quad(high Dose 65+) 05/08/2019, 06/04/2020, 06/02/2021, 06/10/2022   Fluad Trivalent(High Dose 65+) 05/12/2023   Influenza Split 05/26/2011, 06/02/2012   Influenza Whole 05/15/2008, 05/21/2009, 05/27/2010   Influenza, High Dose Seasonal PF 04/27/2016, 04/28/2017, 05/02/2018   Influenza,inj,Quad PF,6+ Mos 05/09/2013, 05/23/2014, 04/29/2015   PFIZER Comirnaty(Gray Top)Covid-19 Tri-Sucrose Vaccine 07/24/2020   PFIZER(Purple Top)SARS-COV-2 Vaccination 09/21/2019,  10/12/2019   PNEUMOCOCCAL CONJUGATE-20 12/10/2022   Pfizer Covid-19 Vaccine Bivalent Booster 33yrs & up 07/01/2021   Pneumococcal Conjugate-13 10/29/2014   Pneumococcal Polysaccharide-23 11/03/2012   Td 03/31/2010   Tdap 01/24/2021   Zoster, Live 09/24/2011    Screening Tests Health Maintenance  Topic Date Due   COVID-19 Vaccine (5 - 2024-25 season) 11/25/2023 (Originally 04/24/2023)   Zoster Vaccines- Shingrix (1 of 2) 02/09/2024 (Originally [birth year]/05/1980)   INFLUENZA VACCINE  03/23/2024   Medicare Annual Wellness (AWV)  11/21/2024   DTaP/Tdap/Td (3 - Td or Tdap) 01/25/2031   Pneumonia Vaccine 75+ Years old  Completed   DEXA SCAN  Completed   HPV VACCINES  Aged Out    Health Maintenance  There are no preventive care reminders to display for this patient.  Health Maintenance Items Addressed: See Nurse Notes  Additional Screening:  Vision Screening: Recommended annual ophthalmology exams for early detection of glaucoma and other disorders of the eye.  Dental Screening: Recommended annual dental exams for proper oral hygiene  Community Resource Referral / Chronic Care Management: CRR required this visit?  No  CCM required this visit?  No     Plan:     I have personally reviewed and noted the following in the patient's chart:   Medical and social history Use of alcohol, tobacco or illicit drugs  Current medications and supplements including opioid prescriptions. Patient is not currently taking opioid prescriptions. Functional ability and status Nutritional status Physical activity Advanced directives List of other physicians Hospitalizations, surgeries, and ER visits in previous 12 months Vitals Screenings to include cognitive, depression, and falls Referrals and appointments  In addition, I have reviewed and discussed with patient certain preventive protocols, quality metrics, and best practice recommendations. A written personalized care plan for preventive  services as well as general preventive health recommendations were provided to patient.     Marzella Schlein, LPN   01/23/1307   After Visit Summary: (MyChart) Due to this being a telephonic visit, the after visit summary with patients personalized plan was offered to patient via MyChart   Notes: Nothing significant to report at this time.

## 2023-11-22 NOTE — Patient Instructions (Signed)
 Lisa Douglas , Thank you for taking time to come for your Medicare Wellness Visit. I appreciate your ongoing commitment to your health goals. Please review the following plan we discussed and let me know if I can assist you in the future.   Referrals/Orders/Follow-Ups/Clinician Recommendations: Each day, aim for 6 glasses of water, plenty of protein in your diet and try to get up and walk/ stretch every hour for 5-10 minutes at a time.  Maintain health and activity  This is a list of the screening recommended for you and due dates:  Health Maintenance  Topic Date Due   Medicare Annual Wellness Visit  11/16/2023   COVID-19 Vaccine (5 - 2024-25 season) 11/25/2023*   Zoster (Shingles) Vaccine (1 of 2) 02/09/2024*   Flu Shot  03/23/2024   DTaP/Tdap/Td vaccine (3 - Td or Tdap) 01/25/2031   Pneumonia Vaccine  Completed   DEXA scan (bone density measurement)  Completed   HPV Vaccine  Aged Out  *Topic was postponed. The date shown is not the original due date.    Advanced directives: (Declined) Advance directive discussed with you today. Even though you declined this today, please call our office should you change your mind, and we can give you the proper paperwork for you to fill out.  Next Medicare Annual Wellness Visit scheduled for next year: Yes

## 2023-12-08 ENCOUNTER — Ambulatory Visit: Attending: Physician Assistant | Admitting: *Deleted

## 2023-12-08 DIAGNOSIS — K55069 Acute infarction of intestine, part and extent unspecified: Secondary | ICD-10-CM | POA: Insufficient documentation

## 2023-12-08 DIAGNOSIS — Z7901 Long term (current) use of anticoagulants: Secondary | ICD-10-CM | POA: Diagnosis not present

## 2023-12-08 DIAGNOSIS — I4821 Permanent atrial fibrillation: Secondary | ICD-10-CM | POA: Insufficient documentation

## 2023-12-08 DIAGNOSIS — Z5181 Encounter for therapeutic drug level monitoring: Secondary | ICD-10-CM | POA: Insufficient documentation

## 2023-12-08 LAB — POCT INR: POC INR: 2

## 2023-12-08 NOTE — Patient Instructions (Signed)
 Description   Continue taking warfarin 1/2 tablet everyday, except 1 tablet Wednesdays.  Continue your leafy intake.  Recheck INR in 6 weeks.  Coumadin Clinic # [phone].      1st Floor: - Lobby - Registration  - Pharmacy  - Lab - Cafe  2nd Floor: - PV Lab - Diagnostic Testing (echo, CT, nuclear med)  3rd Floor: - Vacant  4th Floor: - TCTS (cardiothoracic surgery) - AFib Clinic - Structural Heart Clinic - Vascular Surgery  - Vascular Ultrasound  5th Floor: - [facility] (general and EP) - Clinical Pharmacy for coumadin, hypertension, lipid, weight-loss medications, and med management appointments    Valet parking services will be available as well.

## 2024-01-19 ENCOUNTER — Ambulatory Visit: Attending: Physician Assistant

## 2024-01-19 DIAGNOSIS — I4821 Permanent atrial fibrillation: Secondary | ICD-10-CM | POA: Diagnosis not present

## 2024-01-19 DIAGNOSIS — Z7901 Long term (current) use of anticoagulants: Secondary | ICD-10-CM | POA: Insufficient documentation

## 2024-01-19 DIAGNOSIS — K55069 Acute infarction of intestine, part and extent unspecified: Secondary | ICD-10-CM | POA: Insufficient documentation

## 2024-01-19 LAB — POCT INR: INR: 1.9 — AB (ref 2.0–3.0)

## 2024-01-19 NOTE — Patient Instructions (Signed)
 Description   Take 1 tablet today and then continue taking warfarin 1/2 tablet everyday, except 1 tablet Wednesdays.  Continue your leafy intake.  Recheck INR in 6 weeks.  Coumadin  Clinic # [phone].

## 2024-02-02 ENCOUNTER — Other Ambulatory Visit (HOSPITAL_COMMUNITY): Payer: Self-pay

## 2024-02-02 ENCOUNTER — Other Ambulatory Visit: Payer: Self-pay | Admitting: Physician Assistant

## 2024-02-02 ENCOUNTER — Other Ambulatory Visit: Payer: Self-pay

## 2024-02-02 ENCOUNTER — Telehealth: Payer: Self-pay | Admitting: Physician Assistant

## 2024-02-02 MED ORDER — SIMVASTATIN 10 MG PO TABS
10.0000 mg | ORAL_TABLET | Freq: Every day | ORAL | 3 refills | Status: AC
  Filled 2024-02-02: qty 90, 90d supply, fill #0
  Filled 2024-05-14: qty 90, 90d supply, fill #1
  Filled 2024-08-01: qty 90, 90d supply, fill #2

## 2024-02-02 NOTE — Telephone Encounter (Signed)
*  STAT* If patient is at the pharmacy, call can be transferred to refill team.   1. Which medications need to be refilled? (please list name of each medication and dose if known)   simvastatin  (ZOCOR ) 10 MG tablet    2. Which pharmacy/location (including street and city if local pharmacy) is medication to be sent to?  [place] [facility]      3. Do they need a 30 day or 90 day supply? 90 day    Pt is out of medication

## 2024-02-02 NOTE — Telephone Encounter (Signed)
 Pt of [facility]. At her last office visit in July 2024, her Simvastatin  was lowered by Marlyse Single PA. Other than this, no one from [facility] has prescribed nor refilled Simvastatin . Does Marlyse Single PA want to refill? Please advise.

## 2024-02-02 NOTE — Telephone Encounter (Signed)
Left message for patient, refill sent to the pharmacy.

## 2024-03-01 ENCOUNTER — Ambulatory Visit: Attending: Physician Assistant

## 2024-03-01 DIAGNOSIS — K55069 Acute infarction of intestine, part and extent unspecified: Secondary | ICD-10-CM | POA: Diagnosis not present

## 2024-03-01 DIAGNOSIS — I4821 Permanent atrial fibrillation: Secondary | ICD-10-CM | POA: Insufficient documentation

## 2024-03-01 DIAGNOSIS — Z7901 Long term (current) use of anticoagulants: Secondary | ICD-10-CM | POA: Insufficient documentation

## 2024-03-01 LAB — POCT INR: INR: 1.5 — AB (ref 2.0–3.0)

## 2024-03-01 NOTE — Progress Notes (Signed)
Please see anticoagulation encounter.

## 2024-03-01 NOTE — Patient Instructions (Signed)
 Description   Take 1 tablet today and then START taking warfarin 1/2 tablet everyday, except 1 tablet Mondays and Wednesdays.  Continue your leafy intake (2 PER WEEK)  Recheck INR in 3 weeks.  Coumadin  Clinic # [phone].

## 2024-03-20 ENCOUNTER — Ambulatory Visit: Attending: Physician Assistant

## 2024-03-20 DIAGNOSIS — K55069 Acute infarction of intestine, part and extent unspecified: Secondary | ICD-10-CM | POA: Diagnosis not present

## 2024-03-20 DIAGNOSIS — Z7901 Long term (current) use of anticoagulants: Secondary | ICD-10-CM | POA: Diagnosis not present

## 2024-03-20 DIAGNOSIS — I4821 Permanent atrial fibrillation: Secondary | ICD-10-CM | POA: Insufficient documentation

## 2024-03-20 LAB — POCT INR: INR: 2 (ref 2.0–3.0)

## 2024-03-20 NOTE — Patient Instructions (Signed)
 continue taking warfarin 1/2 tablet everyday, except 1 tablet Mondays and Wednesdays.  Continue your leafy intake (2 PER WEEK)  Recheck INR in 3 weeks.  Coumadin  Clinic # [phone].

## 2024-03-20 NOTE — Progress Notes (Signed)
 INR 2.0 Please see anticoagulation encounter.

## 2024-03-22 ENCOUNTER — Encounter

## 2024-04-09 ENCOUNTER — Encounter: Payer: Self-pay | Admitting: *Deleted

## 2024-04-10 ENCOUNTER — Encounter: Payer: Self-pay | Admitting: Physician Assistant

## 2024-04-10 ENCOUNTER — Ambulatory Visit: Attending: Physician Assistant | Admitting: Physician Assistant

## 2024-04-10 ENCOUNTER — Ambulatory Visit (INDEPENDENT_AMBULATORY_CARE_PROVIDER_SITE_OTHER)

## 2024-04-10 VITALS — BP 110/60 | HR 61 | Resp 16 | Ht 64.0 in | Wt 126.2 lb

## 2024-04-10 DIAGNOSIS — E7800 Pure hypercholesterolemia, unspecified: Secondary | ICD-10-CM | POA: Diagnosis not present

## 2024-04-10 DIAGNOSIS — K55069 Acute infarction of intestine, part and extent unspecified: Secondary | ICD-10-CM | POA: Insufficient documentation

## 2024-04-10 DIAGNOSIS — I4821 Permanent atrial fibrillation: Secondary | ICD-10-CM | POA: Diagnosis not present

## 2024-04-10 DIAGNOSIS — Z7901 Long term (current) use of anticoagulants: Secondary | ICD-10-CM | POA: Diagnosis not present

## 2024-04-10 DIAGNOSIS — I10 Essential (primary) hypertension: Secondary | ICD-10-CM | POA: Insufficient documentation

## 2024-04-10 LAB — POCT INR: INR: 1.9 — AB (ref 2.0–3.0)

## 2024-04-10 NOTE — Patient Instructions (Signed)
 Take 1 tablet today only then continue taking warfarin 1/2 tablet everyday, except 1 tablet Mondays and Wednesdays.  Continue your leafy intake (2 PER WEEK)  Recheck INR in 4 weeks.  Coumadin  Clinic # [phone].

## 2024-04-10 NOTE — Assessment & Plan Note (Addendum)
 Chronic anticoagulation with Coumadin . She is followed in our Coumadin  Clinic. EKG today shows normal rhythm with a first-degree AV block.  She takes multiple AV nodal blocking agents. - Continue Coumadin  as directed by Coumadin  Clinic - Continue Cardizem  CD 180 mg daily - Continue Toprol -xl 25 mg daily - Follow up in six months to repeat EKG - Consider reducing AV nodal blocking agents if PR interval lengthens or heart rate slows

## 2024-04-10 NOTE — Progress Notes (Signed)
 OFFICE NOTE:    Date:  04/10/2024  ID:  Lisa Douglas, DOB 10-19-1927, MRN [id number] PCP: Lisa Garnette KIDD, MD  [facility] Providers Cardiologist:  None Cardiology APP:  Lisa Glendia DASEN, PA-C        Permanent atrial fibrillation  TTE 03/04/12: EF 60, normal wall motion, mild MR, mild BAE, PASP 42  Hypertension  Hyperlipidemia  Hx of SMA embolism requiring thrombectomy  Hx of GI bleeding (gastric AVMs) GERD Macular degeneration  Venous insufficiency  Hx of CVA (noted on CT in 6/22)  Aortic atherosclerosis         Discussed the use of AI scribe software for clinical note transcription with the patient, who gave verbal consent to proceed. History of Present Illness Lisa Douglas is a 89 y.o. female who returns for follow up of AFib. She is a prior patient of Lisa Jude, NP. She was last seen in 02/2023.   EKG today demonstrates sinus rhythm with first-degree AV block.  She was unaware of the return to normal rhythm and reports feeling okay. She has been in atrial fibrillation for years.  She has not had chest pain, shortness of breath, recent syncope.    Review of Systems  Gastrointestinal:  Negative for hematochezia and melena.  Genitourinary:  Negative for hematuria.  -See HPI    Studies Reviewed:  EKG Interpretation Date/Time:  Tuesday April 10 2024 15:36:25 EDT Ventricular Rate:  61 PR Interval:  250 QRS Duration:  64 QT Interval:  394 QTC Calculation: 396 R Axis:   81  Text Interpretation: Sinus rhythm with 1st degree A-V block Low voltage QRS Septal infarct Normal sinus rhythm is new since the last ECG dated 03/08/23 Confirmed by Lisa Glendia [id number]) on 04/10/2024 3:46:36 PM    Labs-chart review 11/09/2023: K 4.5, creatinine 0.59, ALT 11, direct LDL 68, Hgb 13.5  Risk Assessment/Calculations:  CHA2DS2-VASc Score = 6   This indicates a 9.7% annual risk of stroke. The patient's score is based upon: CHF History: 0 HTN History: 1 Diabetes  History: 0 Stroke History: 2 Vascular Disease History: 0 Age Score: 2 Gender Score: 1           Physical Exam:  VS:  BP 110/60   Pulse 61   Resp 16   Ht 5' 4 (1.626 m)   Wt 126 lb 3.2 oz (57.2 kg)   SpO2 93%   BMI 21.66 kg/m        Wt Readings from Last 3 Encounters:  04/10/24 126 lb 3.2 oz (57.2 kg)  11/22/23 127 lb (57.6 kg)  11/09/23 127 lb 12.8 oz (58 kg)    Constitutional:      Appearance: Healthy appearance. Not in distress.  Neck:     Vascular: JVD normal.  Pulmonary:     Breath sounds: Normal breath sounds. No wheezing. No rales.  Cardiovascular:     Normal rate. Regular rhythm.     Murmurs: There is no murmur.  Edema:    Peripheral edema absent.  Abdominal:     Palpations: Abdomen is soft.       Assessment and Plan:    Assessment & Plan Permanent atrial fibrillation (HCC) Chronic anticoagulation with Coumadin . She is followed in our Coumadin  Clinic. EKG today shows normal rhythm with a first-degree AV block.  She takes multiple AV nodal blocking agents. - Continue Coumadin  as directed by Coumadin  Clinic - Continue Cardizem  CD 180 mg daily - Continue Toprol -xl 25 mg daily -  Follow up in six months to repeat EKG - Consider reducing AV nodal blocking agents if PR interval lengthens or heart rate slows Essential hypertension Blood pressure controlled.  Continue diltiazem  180 mg daily, Toprol -XL 25 mg daily. Pure hypercholesterolemia Recent LDL optimal on simvastatin  10 mg daily     Dispo:  Return in about 6 months (around 10/11/2024) for Routine Follow Up, w/ Glendia Ferrier, PA-C.  Signed, Glendia Ferrier, PA-C

## 2024-04-10 NOTE — Assessment & Plan Note (Signed)
 Blood pressure controlled.  Continue diltiazem  180 mg daily, Toprol -XL 25 mg daily.

## 2024-04-10 NOTE — Assessment & Plan Note (Signed)
 Recent LDL optimal on simvastatin  10 mg daily

## 2024-04-10 NOTE — Patient Instructions (Signed)
 Medication Instructions:  Your physician recommends that you continue on your current medications as directed. Please refer to the Current Medication list given to you today.  *If you need a refill on your cardiac medications before your next appointment, please call your pharmacy*  Lab Work: None ordered  If you have labs (blood work) drawn today and your tests are completely normal, you will receive your results only by: MyChart Message (if you have MyChart) OR A paper copy in the mail If you have any lab test that is abnormal or we need to change your treatment, we will call you to review the results.  Testing/Procedures: None ordered  Follow-Up: At [facility], you and your health needs are our priority.  As part of our continuing mission to provide you with exceptional heart care, our providers are all part of one team.  This team includes your primary Cardiologist (physician) and Advanced Practice Providers or APPs (Physician Assistants and Nurse Practitioners) who all work together to provide you with the care you need, when you need it.  Your next appointment:   6 month(s)  Provider:   Marlyse Single, PA-C          We recommend signing up for the patient portal called "MyChart".  Sign up information is provided on this After Visit Summary.  MyChart is used to connect with patients for Virtual Visits (Telemedicine).  Patients are able to view lab/test results, encounter notes, upcoming appointments, etc.  Non-urgent messages can be sent to your provider as well.   To learn more about what you can do with MyChart, go to [email or web address].   Other Instructions

## 2024-04-10 NOTE — Progress Notes (Signed)
 INR 1.9 Please see anticoagulation encounter Take 1 tablet today only then continue taking warfarin 1/2 tablet everyday, except 1 tablet Mondays and Wednesdays.  Continue your leafy intake (2 PER WEEK)  Recheck INR in 4 weeks.  Coumadin  Clinic # [phone].

## 2024-05-10 ENCOUNTER — Ambulatory Visit: Attending: Physician Assistant | Admitting: *Deleted

## 2024-05-10 DIAGNOSIS — Z7901 Long term (current) use of anticoagulants: Secondary | ICD-10-CM | POA: Diagnosis not present

## 2024-05-10 DIAGNOSIS — K55069 Acute infarction of intestine, part and extent unspecified: Secondary | ICD-10-CM | POA: Diagnosis not present

## 2024-05-10 DIAGNOSIS — I4821 Permanent atrial fibrillation: Secondary | ICD-10-CM | POA: Insufficient documentation

## 2024-05-10 DIAGNOSIS — Z5181 Encounter for therapeutic drug level monitoring: Secondary | ICD-10-CM | POA: Insufficient documentation

## 2024-05-10 LAB — POCT INR: POC INR: 1.9

## 2024-05-10 NOTE — Progress Notes (Signed)
 INR 1.9; Please see anticoagulation encounter  Lab Results  Component Value Date   INR 1.9 05/10/2024   INR 1.9 (A) 04/10/2024   INR 2.0 03/20/2024    Description   Start taking warfarin 1/2 a tablet daily except for 1 tablet on Tuesday, Thursday and Saturday. Recheck INR in 3 weeks.   Coumadin  Clinic # [phone].

## 2024-05-10 NOTE — Patient Instructions (Signed)
 Description   Start taking warfarin 1/2 a tablet daily except for 1 tablet on Tuesday, Thursday and Saturday. Recheck INR in 3 weeks.   Coumadin  Clinic # [phone].

## 2024-05-14 ENCOUNTER — Encounter: Payer: Self-pay | Admitting: Family Medicine

## 2024-05-14 ENCOUNTER — Other Ambulatory Visit (HOSPITAL_COMMUNITY): Payer: Self-pay

## 2024-05-14 ENCOUNTER — Ambulatory Visit: Payer: Self-pay | Admitting: Family Medicine

## 2024-05-14 ENCOUNTER — Other Ambulatory Visit: Payer: Self-pay | Admitting: Physician Assistant

## 2024-05-14 ENCOUNTER — Ambulatory Visit (INDEPENDENT_AMBULATORY_CARE_PROVIDER_SITE_OTHER): Admitting: Family Medicine

## 2024-05-14 VITALS — BP 110/50 | HR 67 | Temp 97.5°F | Ht 64.0 in | Wt 124.0 lb

## 2024-05-14 DIAGNOSIS — Z23 Encounter for immunization: Secondary | ICD-10-CM | POA: Diagnosis not present

## 2024-05-14 DIAGNOSIS — E559 Vitamin D deficiency, unspecified: Secondary | ICD-10-CM | POA: Diagnosis not present

## 2024-05-14 DIAGNOSIS — I10 Essential (primary) hypertension: Secondary | ICD-10-CM | POA: Diagnosis not present

## 2024-05-14 DIAGNOSIS — E7800 Pure hypercholesterolemia, unspecified: Secondary | ICD-10-CM | POA: Diagnosis not present

## 2024-05-14 DIAGNOSIS — I4821 Permanent atrial fibrillation: Secondary | ICD-10-CM

## 2024-05-14 LAB — CBC WITH DIFFERENTIAL/PLATELET
Basophils Absolute: 0 K/uL (ref 0.0–0.1)
Basophils Relative: 0.5 % (ref 0.0–3.0)
Eosinophils Absolute: 0.1 K/uL (ref 0.0–0.7)
Eosinophils Relative: 2.2 % (ref 0.0–5.0)
HCT: 39.6 % (ref 36.0–46.0)
Hemoglobin: 13.1 g/dL (ref 12.0–15.0)
Lymphocytes Relative: 24.4 % (ref 12.0–46.0)
Lymphs Abs: 1.3 K/uL (ref 0.7–4.0)
MCHC: 33 g/dL (ref 30.0–36.0)
MCV: 92.8 fl (ref 78.0–100.0)
Monocytes Absolute: 0.6 K/uL (ref 0.1–1.0)
Monocytes Relative: 10.5 % (ref 3.0–12.0)
Neutro Abs: 3.3 K/uL (ref 1.4–7.7)
Neutrophils Relative %: 62.4 % (ref 43.0–77.0)
Platelets: 205 K/uL (ref 150.0–400.0)
RBC: 4.27 Mil/uL (ref 3.87–5.11)
RDW: 14 % (ref 11.5–15.5)
WBC: 5.3 K/uL (ref 4.0–10.5)

## 2024-05-14 LAB — COMPREHENSIVE METABOLIC PANEL WITH GFR
ALT: 14 U/L (ref 0–35)
AST: 23 U/L (ref 0–37)
Albumin: 4.1 g/dL (ref 3.5–5.2)
Alkaline Phosphatase: 56 U/L (ref 39–117)
BUN: 16 mg/dL (ref 6–23)
CO2: 33 meq/L — ABNORMAL HIGH (ref 19–32)
Calcium: 9.4 mg/dL (ref 8.4–10.5)
Chloride: 101 meq/L (ref 96–112)
Creatinine, Ser: 0.63 mg/dL (ref 0.40–1.20)
GFR: 76.06 mL/min (ref >60.00)
Glucose, Bld: 72 mg/dL (ref 70–99)
Potassium: 4.1 meq/L (ref 3.5–5.1)
Sodium: 140 meq/L (ref 135–145)
Total Bilirubin: 0.5 mg/dL (ref 0.2–1.2)
Total Protein: 6.5 g/dL (ref 6.0–8.3)

## 2024-05-14 LAB — LIPID PANEL
Cholesterol: 136 mg/dL (ref 0–200)
HDL: 52.9 mg/dL (ref >39.00)
LDL Cholesterol: 58 mg/dL (ref 0–99)
NonHDL: 82.69
Total CHOL/HDL Ratio: 3
Triglycerides: 121 mg/dL (ref 0.0–149.0)
VLDL: 24.2 mg/dL (ref 0.0–40.0)

## 2024-05-14 LAB — VITAMIN D 25 HYDROXY (VIT D DEFICIENCY, FRACTURES): VITD: 46.93 ng/mL (ref 30.00–100.00)

## 2024-05-14 NOTE — Patient Instructions (Addendum)
 Please stop by lab before you go If you have mychart- we will send your results within 3 business days of us  receiving them.  If you do not have mychart- we will call you about results within 5 business days of us  receiving them.  *please also note that you will see labs on mychart as soon as they post. I will later go in and write notes on them- will say notes from Dr. Katrinka   No changes today unless labs lead us  to make changes  Glad you are doing so well  Recommended follow up: Return in about 6 months (around 11/11/2024) for followup or sooner if needed.Schedule b4 you leave.

## 2024-05-14 NOTE — Progress Notes (Signed)
 Phone [phone] In person visit   Subjective:   Lisa Douglas is a 89 y.o. 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 10/03/2007    Priority: Medium    Osteoporosis 10/03/2007    Priority: Medium    Hyperlipidemia 10/02/2007    Priority: Medium    Angiodysplasia of colon 06/01/2004    Priority: Medium    Osteoarthritis of left knee 10/26/2016    Priority: Low   Epigastric hernia 12/23/2014    Priority: Low   Diverticulitis of colon 06/12/2014    Priority: Low   Long term current use of anticoagulant therapy 03/20/2012    Priority: Low   Leg cramps 12/30/2011    Priority: Low   Vitamin D  deficiency 04/01/2011    Priority: Low   Edema of both lower extremities due to peripheral venous insufficiency 10/03/2007    Priority: Low   GERD 10/03/2007    Priority: Low   DEGENERATIVE JOINT DISEASE 10/03/2007    Priority: Low   Pseudophakia of both eyes 01/26/2018   Posterior vitreous detachment, bilateral 05/25/2016   Nuclear cataract of both eyes 11/08/2014   Macular hole 08/31/2011    Medications- reviewed and updated Current Outpatient Medications  Medication Sig Dispense Refill   Cholecalciferol (VITAMIN D ) 2000 UNITS CAPS Take 2,000 Units by mouth daily.     diltiazem  (CARDIZEM  CD) 180 MG 24 hr capsule Take 1 capsule (180 mg total) by mouth daily. 90  capsule 3   metoprolol  succinate (TOPROL -XL) 25 MG 24 hr tablet Take 1 tablet (25 mg total) by mouth daily. 90 tablet 3   Multiple Vitamin (MULTIVITAMIN WITH MINERALS) TABS Take 1 tablet by mouth daily.     Multiple Vitamins-Minerals (PRESERVISION AREDS) CAPS Take 1 capsule by mouth in the morning and at bedtime.     simvastatin  (ZOCOR ) 10 MG tablet Take 1 tablet (10 mg total) by mouth at bedtime. 90 tablet 3   warfarin (COUMADIN ) 3 MG tablet TAKE 1/2 TO 1 TABLET BY MOUTH AS DIRECTED ONCE DAILY BY THE COUMADIN   CLINIC 60 tablet 3   No current facility-administered medications for this visit.     Objective:  BP (!) 110/50   Pulse 67   Temp (!) 97.5 F (36.4 C)   Ht 5' 4 (1.626 m)   Wt 124 lb (56.2 kg)   SpO2 97%   BMI 21.28 kg/m  Gen: NAD, resting comfortably CV: RRR no murmurs rubs or gallops Lungs: CTAB no crackles, wheeze, rhonchi Ext: trace edema right greater than left Skin: warm, dry     Assessment and Plan   #health maintenance- declines COVID shot  # Atrial fibrillation/also with history superior mesenteric artery thrombosis- secondary indication for long term coumadin  (through [facility] card clinic) S: Medication: Metoprolol  25 mg XR and diltiazem  180mg  XR. Anticoagulated on coumadin  through cardiology   - normal sinus  rhythm at last cardiology visit surprisingly A/P: appropriately anticoagulated and rate controlled- continue current medicine    # Hypertension S:compliant with metoprolol  25 mg XR, diltiazem  180mg  XR  A/P: well controlled continue current medications    # Hyperlipidemia/aortic atherosclerosis # Statin myalgia S:compliant with simvastatin  20 mg--> 10 mg due to myalgias.  Aortic atherosclerosis noted 01/28/15 on CT scan Lab Results  Component Value Date   CHOL 139 05/12/2023   HDL 54.80 05/12/2023   LDLCALC 60 05/12/2023   LDLDIRECT 68.0 11/09/2023   TRIG 123.0 05/12/2023   CHOLHDL 3 05/12/2023  A/P: well controlled continue current medications  .  Myalgias better on this dose.   # Osteoporosis/vitamin D  deficiency S:  Patient did not tolerate fosamax. Does not want injectables like prolia or reclast. She is on vitamin D  (she opted not to take calcium). Declineds bone density retest as would not want medication.  discussed importance of avoiding falls Last vitamin D  Lab Results  Component Value Date   VD25OH 53.74 05/12/2023   A/P: vitamin D  has been well controlled- continue current medications 2000 units and update today - opts out of bone density  #knee pain- biofreeze and heat helps. Does bother her walking. Walks with walker or puts hand support person  #some cough but saline solution resolves this if needed- likely allergies  Recommended follow up: Return in about 6 months (around 11/11/2024) for followup or sooner if needed.Schedule b4 you leave. Future Appointments  Date Time Provider Department Center  05/31/2024  2:15 PM [facility] COUMADIN  CLINIC 2 [facility]  11/27/2024  1:40 PM [facility] ANNUAL WELLNESS VISIT 1 [facility] [place]   Lab/Order associations:   ICD-10-CM   1. Permanent atrial fibrillation (HCC)  I48.21     2. Essential hypertension  I10     3. Pure hypercholesterolemia  E78.00 Comprehensive metabolic panel with GFR    CBC with Differential/Platelet    Lipid panel    4. Vitamin D  deficiency  E55.9 VITAMIN D  25 Hydroxy (Vit-D Deficiency, Fractures)    5. Need for immunization against influenza  Z23 Flu vaccine HIGH DOSE PF(Fluzone Trivalent)      No orders of the defined types were placed in this encounter.   Return precautions advised.  Garnette Lukes, MD

## 2024-05-15 ENCOUNTER — Other Ambulatory Visit (HOSPITAL_COMMUNITY): Payer: Self-pay

## 2024-05-15 MED ORDER — METOPROLOL SUCCINATE ER 25 MG PO TB24
25.0000 mg | ORAL_TABLET | Freq: Every day | ORAL | 3 refills | Status: AC
  Filled 2024-05-15: qty 90, 90d supply, fill #0
  Filled 2024-08-20: qty 90, 90d supply, fill #1

## 2024-05-15 MED ORDER — DILTIAZEM HCL ER COATED BEADS 180 MG PO CP24
180.0000 mg | ORAL_CAPSULE | Freq: Every day | ORAL | 3 refills | Status: AC
  Filled 2024-05-15: qty 90, 90d supply, fill #0
  Filled 2024-08-20: qty 90, 90d supply, fill #1

## 2024-05-28 ENCOUNTER — Other Ambulatory Visit (HOSPITAL_COMMUNITY): Payer: Self-pay

## 2024-05-28 ENCOUNTER — Other Ambulatory Visit: Payer: Self-pay | Admitting: Physician Assistant

## 2024-05-28 ENCOUNTER — Other Ambulatory Visit: Payer: Self-pay

## 2024-05-28 DIAGNOSIS — I4821 Permanent atrial fibrillation: Secondary | ICD-10-CM

## 2024-05-28 MED ORDER — WARFARIN SODIUM 3 MG PO TABS
ORAL_TABLET | ORAL | 3 refills | Status: AC
  Filled 2024-05-28: qty 60, 90d supply, fill #0
  Filled 2024-08-20: qty 60, 90d supply, fill #1

## 2024-05-31 ENCOUNTER — Ambulatory Visit: Attending: Physician Assistant

## 2024-05-31 DIAGNOSIS — Z7901 Long term (current) use of anticoagulants: Secondary | ICD-10-CM | POA: Insufficient documentation

## 2024-05-31 DIAGNOSIS — I4821 Permanent atrial fibrillation: Secondary | ICD-10-CM | POA: Insufficient documentation

## 2024-05-31 DIAGNOSIS — K55069 Acute infarction of intestine, part and extent unspecified: Secondary | ICD-10-CM | POA: Diagnosis not present

## 2024-05-31 LAB — POCT INR: INR: 2.8 (ref 2.0–3.0)

## 2024-05-31 NOTE — Progress Notes (Signed)
 INR 2.8 Please see anticoagulation encounter Continue 1/2 a tablet daily except for 1 tablet on Tuesday, Thursday and Saturday. Recheck INR in 4 weeks. Eat prunes tonight.  Coumadin  Clinic # [phone].

## 2024-05-31 NOTE — Patient Instructions (Signed)
 Continue 1/2 a tablet daily except for 1 tablet on Tuesday, Thursday and Saturday. Recheck INR in 4 weeks. Eat prunes tonight.  Coumadin  Clinic # [phone].

## 2024-06-28 ENCOUNTER — Ambulatory Visit: Attending: Physician Assistant

## 2024-06-28 ENCOUNTER — Ambulatory Visit

## 2024-06-28 DIAGNOSIS — I4821 Permanent atrial fibrillation: Secondary | ICD-10-CM | POA: Insufficient documentation

## 2024-06-28 DIAGNOSIS — K55069 Acute infarction of intestine, part and extent unspecified: Secondary | ICD-10-CM | POA: Insufficient documentation

## 2024-06-28 DIAGNOSIS — Z7901 Long term (current) use of anticoagulants: Secondary | ICD-10-CM | POA: Insufficient documentation

## 2024-06-28 LAB — POCT INR: INR: 3 (ref 2.0–3.0)

## 2024-06-28 NOTE — Progress Notes (Signed)
 INR 3.0 Please see anticoagulation encounter Decrease to 0.5 tablet daily except 1 tablet every Tuesday and Thursday. Recheck INR in 4 weeks.   Coumadin  Clinic # [phone].

## 2024-06-28 NOTE — Patient Instructions (Signed)
 Decrease to 0.5 tablet daily except 1 tablet every Tuesday and Thursday. Recheck INR in 4 weeks.   Coumadin  Clinic # [phone].

## 2024-07-26 ENCOUNTER — Ambulatory Visit: Attending: Physician Assistant

## 2024-07-26 DIAGNOSIS — K55069 Acute infarction of intestine, part and extent unspecified: Secondary | ICD-10-CM | POA: Diagnosis not present

## 2024-07-26 DIAGNOSIS — Z7901 Long term (current) use of anticoagulants: Secondary | ICD-10-CM | POA: Diagnosis not present

## 2024-07-26 DIAGNOSIS — I4821 Permanent atrial fibrillation: Secondary | ICD-10-CM | POA: Insufficient documentation

## 2024-07-26 LAB — POCT INR: INR: 2.5 (ref 2.0–3.0)

## 2024-07-26 NOTE — Patient Instructions (Signed)
 Continue 0.5 tablet daily except 1 tablet every Tuesday and Thursday. Recheck INR in 6 weeks.   Coumadin  Clinic # [phone].

## 2024-07-26 NOTE — Progress Notes (Signed)
 INR 2.5 Please see anticoagulation encounter Continue 0.5 tablet daily except 1 tablet every Tuesday and Thursday. Recheck INR in 6 weeks.   Coumadin  Clinic # [phone].

## 2024-08-01 ENCOUNTER — Other Ambulatory Visit (HOSPITAL_COMMUNITY): Payer: Self-pay

## 2024-08-20 ENCOUNTER — Other Ambulatory Visit (HOSPITAL_COMMUNITY): Payer: Self-pay

## 2024-09-06 ENCOUNTER — Ambulatory Visit: Attending: Physician Assistant

## 2024-09-06 DIAGNOSIS — I4821 Permanent atrial fibrillation: Secondary | ICD-10-CM | POA: Diagnosis present

## 2024-09-06 DIAGNOSIS — Z7901 Long term (current) use of anticoagulants: Secondary | ICD-10-CM | POA: Diagnosis present

## 2024-09-06 DIAGNOSIS — K55069 Acute infarction of intestine, part and extent unspecified: Secondary | ICD-10-CM | POA: Diagnosis present

## 2024-09-06 LAB — POCT INR: INR: 2.1 (ref 2.0–3.0)

## 2024-09-06 NOTE — Patient Instructions (Signed)
 Continue 0.5 tablet daily except 1 tablet every Tuesday and Thursday. Recheck INR in 6 weeks.   Coumadin  Clinic # [phone].

## 2024-09-06 NOTE — Progress Notes (Signed)
"   INR 2.1 Please see anticoagulation encounter Continue 0.5 tablet daily except 1 tablet every Tuesday and Thursday. Recheck INR in 6 weeks.   Coumadin  Clinic # [phone].  "

## 2024-10-18 ENCOUNTER — Ambulatory Visit

## 2024-11-07 ENCOUNTER — Ambulatory Visit: Admitting: Physician Assistant

## 2024-11-12 ENCOUNTER — Ambulatory Visit: Admitting: Family Medicine

## 2024-11-27 ENCOUNTER — Ambulatory Visit
